# Patient Record
Sex: Female | Born: 1955 | Race: White | Hispanic: No | Smoking: Never smoker
Health system: Southern US, Community
[De-identification: ages and names within clinical notes are randomized; demographics above are authoritative.]

## PROBLEM LIST (undated history)

## (undated) DIAGNOSIS — Z803 Family history of malignant neoplasm of breast: Secondary | ICD-10-CM

## (undated) DIAGNOSIS — R931 Abnormal findings on diagnostic imaging of heart and coronary circulation: Secondary | ICD-10-CM

## (undated) DIAGNOSIS — Z1379 Encounter for other screening for genetic and chromosomal anomalies: Principal | ICD-10-CM

## (undated) DIAGNOSIS — C50919 Malignant neoplasm of unspecified site of unspecified female breast: Secondary | ICD-10-CM

## (undated) DIAGNOSIS — O139 Gestational [pregnancy-induced] hypertension without significant proteinuria, unspecified trimester: Secondary | ICD-10-CM

## (undated) DIAGNOSIS — D126 Benign neoplasm of colon, unspecified: Secondary | ICD-10-CM

## (undated) DIAGNOSIS — I10 Essential (primary) hypertension: Secondary | ICD-10-CM

## (undated) DIAGNOSIS — Z8679 Personal history of other diseases of the circulatory system: Secondary | ICD-10-CM

## (undated) DIAGNOSIS — Z8739 Personal history of other diseases of the musculoskeletal system and connective tissue: Secondary | ICD-10-CM

## (undated) DIAGNOSIS — Z8742 Personal history of other diseases of the female genital tract: Secondary | ICD-10-CM

## (undated) DIAGNOSIS — I6381 Other cerebral infarction due to occlusion or stenosis of small artery: Secondary | ICD-10-CM

## (undated) DIAGNOSIS — J45909 Unspecified asthma, uncomplicated: Secondary | ICD-10-CM

## (undated) DIAGNOSIS — N950 Postmenopausal bleeding: Secondary | ICD-10-CM

## (undated) DIAGNOSIS — Z8719 Personal history of other diseases of the digestive system: Secondary | ICD-10-CM

## (undated) DIAGNOSIS — Z8709 Personal history of other diseases of the respiratory system: Secondary | ICD-10-CM

## (undated) DIAGNOSIS — E785 Hyperlipidemia, unspecified: Secondary | ICD-10-CM

## (undated) DIAGNOSIS — R102 Pelvic and perineal pain: Secondary | ICD-10-CM

## (undated) DIAGNOSIS — R112 Nausea with vomiting, unspecified: Secondary | ICD-10-CM

## (undated) DIAGNOSIS — R300 Dysuria: Secondary | ICD-10-CM

## (undated) DIAGNOSIS — H8109 Meniere's disease, unspecified ear: Secondary | ICD-10-CM

## (undated) DIAGNOSIS — I509 Heart failure, unspecified: Secondary | ICD-10-CM

## (undated) DIAGNOSIS — K760 Fatty (change of) liver, not elsewhere classified: Secondary | ICD-10-CM

## (undated) DIAGNOSIS — I5042 Chronic combined systolic (congestive) and diastolic (congestive) heart failure: Secondary | ICD-10-CM

## (undated) DIAGNOSIS — Z86010 Personal history of colonic polyps: Secondary | ICD-10-CM

## (undated) DIAGNOSIS — Z853 Personal history of malignant neoplasm of breast: Principal | ICD-10-CM

## (undated) DIAGNOSIS — I4510 Unspecified right bundle-branch block: Secondary | ICD-10-CM

## (undated) DIAGNOSIS — T8859XA Other complications of anesthesia, initial encounter: Secondary | ICD-10-CM

## (undated) DIAGNOSIS — J309 Allergic rhinitis, unspecified: Secondary | ICD-10-CM

## (undated) DIAGNOSIS — I420 Dilated cardiomyopathy: Secondary | ICD-10-CM

## (undated) DIAGNOSIS — Z8669 Personal history of other diseases of the nervous system and sense organs: Secondary | ICD-10-CM

## (undated) DIAGNOSIS — Z9889 Other specified postprocedural states: Secondary | ICD-10-CM

## (undated) DIAGNOSIS — Z872 Personal history of diseases of the skin and subcutaneous tissue: Secondary | ICD-10-CM

## (undated) DIAGNOSIS — G4733 Obstructive sleep apnea (adult) (pediatric): Secondary | ICD-10-CM

## (undated) DIAGNOSIS — T4145XA Adverse effect of unspecified anesthetic, initial encounter: Secondary | ICD-10-CM

## (undated) DIAGNOSIS — Z8489 Family history of other specified conditions: Secondary | ICD-10-CM

## (undated) HISTORY — DX: Personal history of malignant neoplasm of breast: Z85.3

## (undated) HISTORY — DX: Allergic rhinitis, unspecified: J30.9

## (undated) HISTORY — DX: Hyperlipidemia, unspecified: E78.5

## (undated) HISTORY — DX: Personal history of other diseases of the respiratory system: Z87.09

## (undated) HISTORY — DX: Unspecified asthma, uncomplicated: J45.909

## (undated) HISTORY — DX: Personal history of other diseases of the circulatory system: Z86.79

## (undated) HISTORY — DX: Encounter for other screening for genetic and chromosomal anomalies: Z13.79

## (undated) HISTORY — DX: Personal history of diseases of the skin and subcutaneous tissue: Z87.2

## (undated) HISTORY — DX: Dysuria: R30.0

## (undated) HISTORY — DX: Personal history of other diseases of the nervous system and sense organs: Z86.69

## (undated) HISTORY — DX: Personal history of other diseases of the digestive system: Z87.19

## (undated) HISTORY — DX: Personal history of colonic polyps: Z86.010

## (undated) HISTORY — DX: Dilated cardiomyopathy: I42.0

## (undated) HISTORY — PX: LAPAROSCOPIC CHOLECYSTECTOMY: SUR755

## (undated) HISTORY — DX: Benign neoplasm of colon, unspecified: D12.6

## (undated) HISTORY — DX: Abnormal findings on diagnostic imaging of heart and coronary circulation: R93.1

## (undated) HISTORY — DX: Personal history of other diseases of the female genital tract: Z87.42

## (undated) HISTORY — DX: Personal history of other diseases of the musculoskeletal system and connective tissue: Z87.39

## (undated) HISTORY — DX: Pelvic and perineal pain: R10.2

## (undated) HISTORY — DX: Other cerebral infarction due to occlusion or stenosis of small artery: I63.81

## (undated) HISTORY — DX: Postmenopausal bleeding: N95.0

## (undated) HISTORY — DX: Family history of malignant neoplasm of breast: Z80.3

## (undated) HISTORY — DX: Essential (primary) hypertension: I10

## (undated) HISTORY — DX: Unspecified right bundle-branch block: I45.10

## (undated) HISTORY — DX: Heart failure, unspecified: I50.9

## (undated) HISTORY — DX: Chronic combined systolic (congestive) and diastolic (congestive) heart failure: I50.42

## (undated) HISTORY — DX: Meniere's disease, unspecified ear: H81.09

## (undated) HISTORY — DX: Fatty (change of) liver, not elsewhere classified: K76.0

## (undated) HISTORY — DX: Gestational (pregnancy-induced) hypertension without significant proteinuria, unspecified trimester: O13.9

## (undated) HISTORY — PX: TUBAL LIGATION: SHX77

## (undated) HISTORY — PX: NEPHRECTOMY: SHX65

## (undated) HISTORY — DX: Malignant neoplasm of unspecified site of unspecified female breast: C50.919

## (undated) HISTORY — DX: Obstructive sleep apnea (adult) (pediatric): G47.33

---

## 1994-11-08 DIAGNOSIS — C50919 Malignant neoplasm of unspecified site of unspecified female breast: Secondary | ICD-10-CM

## 1994-11-08 HISTORY — PX: OTHER SURGICAL HISTORY: SHX169

## 1994-11-08 HISTORY — DX: Malignant neoplasm of unspecified site of unspecified female breast: C50.919

## 1995-06-09 HISTORY — PX: MASTECTOMY: SHX3

## 1998-04-15 ENCOUNTER — Ambulatory Visit (HOSPITAL_COMMUNITY): Admission: RE | Admit: 1998-04-15 | Discharge: 1998-04-15 | Payer: Self-pay | Admitting: Oncology

## 1998-08-13 ENCOUNTER — Ambulatory Visit (HOSPITAL_COMMUNITY): Admission: RE | Admit: 1998-08-13 | Discharge: 1998-08-13 | Payer: Self-pay | Admitting: Urology

## 1998-08-20 ENCOUNTER — Ambulatory Visit (HOSPITAL_COMMUNITY): Admission: RE | Admit: 1998-08-20 | Discharge: 1998-08-20 | Payer: Self-pay | Admitting: Urology

## 1998-08-26 ENCOUNTER — Ambulatory Visit (HOSPITAL_COMMUNITY): Admission: RE | Admit: 1998-08-26 | Discharge: 1998-08-26 | Payer: Self-pay | Admitting: Urology

## 1998-09-08 ENCOUNTER — Inpatient Hospital Stay (HOSPITAL_COMMUNITY): Admission: RE | Admit: 1998-09-08 | Discharge: 1998-09-11 | Payer: Self-pay | Admitting: Urology

## 1998-11-10 ENCOUNTER — Ambulatory Visit (HOSPITAL_COMMUNITY): Admission: RE | Admit: 1998-11-10 | Discharge: 1998-11-10 | Payer: Self-pay | Admitting: Specialist

## 1998-11-10 ENCOUNTER — Encounter: Payer: Self-pay | Admitting: Specialist

## 1999-06-11 ENCOUNTER — Encounter: Payer: Self-pay | Admitting: General Surgery

## 1999-06-16 ENCOUNTER — Observation Stay (HOSPITAL_COMMUNITY): Admission: RE | Admit: 1999-06-16 | Discharge: 1999-06-17 | Payer: Self-pay | Admitting: General Surgery

## 1999-06-16 ENCOUNTER — Encounter (INDEPENDENT_AMBULATORY_CARE_PROVIDER_SITE_OTHER): Payer: Self-pay | Admitting: Specialist

## 2000-01-22 ENCOUNTER — Other Ambulatory Visit: Admission: RE | Admit: 2000-01-22 | Discharge: 2000-01-22 | Payer: Self-pay | Admitting: Family Medicine

## 2000-05-24 ENCOUNTER — Encounter: Payer: Self-pay | Admitting: Cardiology

## 2000-05-24 ENCOUNTER — Observation Stay (HOSPITAL_COMMUNITY): Admission: EM | Admit: 2000-05-24 | Discharge: 2000-05-25 | Payer: Self-pay | Admitting: Podiatry

## 2000-06-01 ENCOUNTER — Encounter: Payer: Self-pay | Admitting: Pulmonary Disease

## 2000-06-01 ENCOUNTER — Ambulatory Visit (HOSPITAL_COMMUNITY): Admission: RE | Admit: 2000-06-01 | Discharge: 2000-06-01 | Payer: Self-pay | Admitting: Pulmonary Disease

## 2000-06-13 ENCOUNTER — Encounter: Payer: Self-pay | Admitting: Oncology

## 2000-06-13 ENCOUNTER — Encounter: Admission: RE | Admit: 2000-06-13 | Discharge: 2000-06-13 | Payer: Self-pay | Admitting: Oncology

## 2000-06-15 ENCOUNTER — Ambulatory Visit: Admission: RE | Admit: 2000-06-15 | Discharge: 2000-06-15 | Payer: Self-pay | Admitting: Pulmonary Disease

## 2001-02-17 ENCOUNTER — Other Ambulatory Visit: Admission: RE | Admit: 2001-02-17 | Discharge: 2001-02-17 | Payer: Self-pay | Admitting: Family Medicine

## 2001-08-03 ENCOUNTER — Encounter: Payer: Self-pay | Admitting: Oncology

## 2001-08-03 ENCOUNTER — Encounter: Admission: RE | Admit: 2001-08-03 | Discharge: 2001-08-03 | Payer: Self-pay | Admitting: Oncology

## 2002-04-03 ENCOUNTER — Other Ambulatory Visit: Admission: RE | Admit: 2002-04-03 | Discharge: 2002-04-03 | Payer: Self-pay | Admitting: Family Medicine

## 2002-08-14 ENCOUNTER — Encounter: Payer: Self-pay | Admitting: Family Medicine

## 2002-08-14 ENCOUNTER — Encounter: Admission: RE | Admit: 2002-08-14 | Discharge: 2002-08-14 | Payer: Self-pay | Admitting: Family Medicine

## 2003-01-22 ENCOUNTER — Encounter: Payer: Self-pay | Admitting: Orthopedic Surgery

## 2003-01-22 ENCOUNTER — Encounter: Admission: RE | Admit: 2003-01-22 | Discharge: 2003-01-22 | Payer: Self-pay | Admitting: Orthopedic Surgery

## 2003-03-13 ENCOUNTER — Other Ambulatory Visit: Admission: RE | Admit: 2003-03-13 | Discharge: 2003-03-13 | Payer: Self-pay | Admitting: Family Medicine

## 2003-08-20 ENCOUNTER — Encounter: Payer: Self-pay | Admitting: Oncology

## 2003-08-20 ENCOUNTER — Encounter: Admission: RE | Admit: 2003-08-20 | Discharge: 2003-08-20 | Payer: Self-pay | Admitting: Oncology

## 2003-11-14 ENCOUNTER — Ambulatory Visit (HOSPITAL_COMMUNITY): Admission: RE | Admit: 2003-11-14 | Discharge: 2003-11-14 | Payer: Self-pay | Admitting: Cardiology

## 2003-11-14 ENCOUNTER — Encounter (INDEPENDENT_AMBULATORY_CARE_PROVIDER_SITE_OTHER): Payer: Self-pay | Admitting: Interventional Cardiology

## 2004-04-16 ENCOUNTER — Other Ambulatory Visit: Admission: RE | Admit: 2004-04-16 | Discharge: 2004-04-16 | Payer: Self-pay | Admitting: Family Medicine

## 2004-04-22 ENCOUNTER — Encounter: Admission: RE | Admit: 2004-04-22 | Discharge: 2004-04-22 | Payer: Self-pay | Admitting: Family Medicine

## 2005-03-01 ENCOUNTER — Ambulatory Visit (HOSPITAL_COMMUNITY): Admission: RE | Admit: 2005-03-01 | Discharge: 2005-03-01 | Payer: Self-pay | Admitting: Gastroenterology

## 2005-03-01 ENCOUNTER — Encounter (INDEPENDENT_AMBULATORY_CARE_PROVIDER_SITE_OTHER): Payer: Self-pay | Admitting: Specialist

## 2005-07-01 ENCOUNTER — Encounter: Admission: RE | Admit: 2005-07-01 | Discharge: 2005-07-01 | Payer: Self-pay | Admitting: Oncology

## 2005-08-05 ENCOUNTER — Other Ambulatory Visit: Admission: RE | Admit: 2005-08-05 | Discharge: 2005-08-05 | Payer: Self-pay | Admitting: Family Medicine

## 2005-11-08 HISTORY — PX: OTHER SURGICAL HISTORY: SHX169

## 2006-03-09 ENCOUNTER — Encounter: Payer: Self-pay | Admitting: Urology

## 2006-07-06 ENCOUNTER — Encounter: Admission: RE | Admit: 2006-07-06 | Discharge: 2006-07-06 | Payer: Self-pay | Admitting: Family Medicine

## 2006-09-27 ENCOUNTER — Other Ambulatory Visit: Admission: RE | Admit: 2006-09-27 | Discharge: 2006-09-27 | Payer: Self-pay | Admitting: Family Medicine

## 2007-07-17 ENCOUNTER — Encounter: Admission: RE | Admit: 2007-07-17 | Discharge: 2007-07-17 | Payer: Self-pay | Admitting: Family Medicine

## 2007-10-02 ENCOUNTER — Other Ambulatory Visit: Admission: RE | Admit: 2007-10-02 | Discharge: 2007-10-02 | Payer: Self-pay | Admitting: Family Medicine

## 2007-11-09 DIAGNOSIS — Z8739 Personal history of other diseases of the musculoskeletal system and connective tissue: Secondary | ICD-10-CM

## 2007-11-09 HISTORY — DX: Personal history of other diseases of the musculoskeletal system and connective tissue: Z87.39

## 2008-07-18 ENCOUNTER — Encounter: Admission: RE | Admit: 2008-07-18 | Discharge: 2008-07-18 | Payer: Self-pay | Admitting: Family Medicine

## 2009-09-11 ENCOUNTER — Encounter: Admission: RE | Admit: 2009-09-11 | Discharge: 2009-09-11 | Payer: Self-pay | Admitting: Family Medicine

## 2009-11-08 DIAGNOSIS — N950 Postmenopausal bleeding: Secondary | ICD-10-CM

## 2009-11-08 DIAGNOSIS — R300 Dysuria: Secondary | ICD-10-CM

## 2009-11-08 HISTORY — DX: Postmenopausal bleeding: N95.0

## 2009-11-08 HISTORY — DX: Dysuria: R30.0

## 2010-03-02 ENCOUNTER — Other Ambulatory Visit: Admission: RE | Admit: 2010-03-02 | Discharge: 2010-03-02 | Payer: Self-pay | Admitting: Family Medicine

## 2010-03-27 ENCOUNTER — Encounter: Admission: RE | Admit: 2010-03-27 | Discharge: 2010-03-27 | Payer: Self-pay | Admitting: Family Medicine

## 2010-11-13 ENCOUNTER — Emergency Department (HOSPITAL_BASED_OUTPATIENT_CLINIC_OR_DEPARTMENT_OTHER)
Admission: EM | Admit: 2010-11-13 | Discharge: 2010-11-13 | Payer: Self-pay | Source: Home / Self Care | Admitting: Emergency Medicine

## 2010-11-13 LAB — DIFFERENTIAL
Basophils Absolute: 0 10*3/uL (ref 0.0–0.1)
Basophils Relative: 0 % (ref 0–1)
Eosinophils Absolute: 0.2 10*3/uL (ref 0.0–0.7)
Eosinophils Relative: 3 % (ref 0–5)
Lymphocytes Relative: 47 % — ABNORMAL HIGH (ref 12–46)
Lymphs Abs: 3.3 10*3/uL (ref 0.7–4.0)
Monocytes Absolute: 0.9 10*3/uL (ref 0.1–1.0)
Monocytes Relative: 13 % — ABNORMAL HIGH (ref 3–12)
Neutro Abs: 2.6 10*3/uL (ref 1.7–7.7)
Neutrophils Relative %: 38 % — ABNORMAL LOW (ref 43–77)

## 2010-11-13 LAB — BASIC METABOLIC PANEL
BUN: 22 mg/dL (ref 6–23)
CO2: 27 mEq/L (ref 19–32)
Calcium: 9.3 mg/dL (ref 8.4–10.5)
Chloride: 102 mEq/L (ref 96–112)
Creatinine, Ser: 1 mg/dL (ref 0.4–1.2)
GFR calc Af Amer: >60 mL/min (ref >60)
GFR calc non Af Amer: 57 mL/min — ABNORMAL LOW (ref >60)
Glucose, Bld: 118 mg/dL — ABNORMAL HIGH (ref 70–99)
Potassium: 3.2 mEq/L — ABNORMAL LOW (ref 3.5–5.1)
Sodium: 142 mEq/L (ref 135–145)

## 2010-11-13 LAB — CBC
HCT: 40.4 % (ref 36.0–46.0)
Hemoglobin: 13.7 g/dL (ref 12.0–15.0)
MCH: 29 pg (ref 26.0–34.0)
MCHC: 33.9 g/dL (ref 30.0–36.0)
MCV: 85.6 fL (ref 78.0–100.0)
Platelets: 238 10*3/uL (ref 150–400)
RBC: 4.72 MIL/uL (ref 3.87–5.11)
RDW: 13.6 % (ref 11.5–15.5)
WBC: 7 10*3/uL (ref 4.0–10.5)

## 2010-11-17 ENCOUNTER — Encounter
Admission: RE | Admit: 2010-11-17 | Discharge: 2010-11-17 | Payer: Self-pay | Source: Home / Self Care | Attending: Family Medicine | Admitting: Family Medicine

## 2010-11-23 ENCOUNTER — Encounter
Admission: RE | Admit: 2010-11-23 | Discharge: 2010-11-23 | Payer: Self-pay | Source: Home / Self Care | Attending: Family Medicine | Admitting: Family Medicine

## 2010-11-28 ENCOUNTER — Encounter: Payer: Self-pay | Admitting: Family Medicine

## 2011-03-26 NOTE — Op Note (Signed)
NAME:  Meagan Ryan, Meagan Ryan NO.:  [id number]   MEDICAL RECORD NO.:  [id number]          PATIENT TYPE:  AMB   LOCATION:  ENDO                         FACILITY:  [facility]   PHYSICIAN:  Graylin Shiver, M.D.   DATE OF BIRTH:  1956-05-08   DATE OF PROCEDURE:  03/01/2005  DATE OF DISCHARGE:                                 OPERATIVE REPORT   PROCEDURE:  Colonoscopy with biopsy.   INDICATIONS FOR PROCEDURE:  Right lower quadrant abdominal pain, which the  patient was experiencing.  As of today, the patient states that she is no  longer having that pain.  Also, screening colonoscopy due to age of 55 years  old.   Informed consent was obtained after explanation of the risks of bleeding,  infection, and perforation.   PREMEDICATIONS:  Fentanyl 87.5 mcg IV, Versed 9 mg IV.   PROCEDURE:  With the patient in the left lateral decubitus position, a  rectal exam was performed.  No masses were felt.  The Olympus colonoscope  was inserted into the rectum and advanced around the colon to the cecum.  Cecal landmarks were identified.  The cecum looked normal.  The ascending  colon looked normal.  In the proximal transverse colon, there was a 3 mm  polyp biopsied off with cold forceps.  .  The descending colon, sigmoid, and  rectum looked normal.  She tolerated the procedure well without  complications.   IMPRESSION:  Small transverse colon polyp.   PLAN:  The pathology will be checked.      SFG/MEDQ  D:  03/01/2005  T:  03/01/2005  Job:  [id number]   cc:   Caryn Bee L. Little, M.D.  [address]  [place]  [place] [address]  Fax: [phone]

## 2011-03-26 NOTE — H&P (Signed)
[place]  Patient:    MATTINGLY, FOUNTAINE                  MRN: [id number] Adm. Date:  [id number] Attending:  Corliss Marcus Dictator:   Anselm Lis, N.P. CC:         Anna Genre. Little, M.D.             Leighton Roach. Truett Perna, M.D.             [facility] - Attention:  Dr. Oley Balm. Simonds or Dr                         History and Physical  DATE OF BIRTH:  01-08-1956  HISTORY OF PRESENT ILLNESS:  Ms. 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 Her cardiomyopathy has improved on medical therapy, so that her ejection fraction is 55% by recent 2-D echocardiogram.  The patient has a three-month history of progressive shortness of breath and weakness, exacerbated over the last few weeks, so that she is short of breath after minimal activity such as taking a shower.  Fatigue seems to follow her shortness of breath.  She has also noted new symptoms of abdominal swelling and GERD over the last month, with epigastric/substernal burning and associated sour taste after eating/drinking anything.  She takes her husbands Zantac with occasional improvement.  This discomfort is exacerbated with reclining back.  She has noted a weight gain of 15-20 pounds without appreciable increase in appetite or food consumption over the last few months as well.  She  complains of orthopnea when initially reclining back, though she denies PND, and she can wake up in a flat lying position without shortness of breath.  No pedal  edema.  Recent clinical examination by Dr. Francisca December included a chest x-ray which was negative for CHF, and a 2-D echocardiogram revealing normal LV size and function with an ejection fraction of 55%.  Her systolic blood pressure has been under good control, though elevated diastolic of 90-100.  She is maintaining sinus tachycardia in the low  100s.  Her room air O2 saturations are 98%.  Lasix was added on the day prior to admission without appreciable improvement in the shortness f breath symptoms.  PAST MEDICAL HISTORY: 1. Adriamycin-induced cardiomyopathy, improved with medical therapy, an    ejection fraction of 55% by a 2-D echocardiogram on May 20, 2000. 2. Hypertension, under good control on the current medical regimen. 3. Left breast cancer with subsequent left mastectomy with reconstructive    surgery.  Had "double-dose" chemotherapy without apparent recurrence of    cancer.  She had a normal bone scan last year.  Her breast cancer was    approximately five years earlier. 4. Cholecystectomy three years earlier. 5. 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  The kidney    was finally removed because of complaints of abdominal pain.  The patient denies a history of diabetes mellitus, asthma, or thyroid disease.  PAST SURGICAL HISTORY: 1. Cholecystectomy. 2. Right nephrectomy. 3. Left mastectomy with reconstructive breast surgery. 4. A Port-A-Cath left anterior chest inserted and removed.  ALLERGIES:  Nausea with SEA FOOD.  PENICILLIN causes a rash.  CEPHALEXIN, SULFA, AND "NOROXIN" causing severe nausea.  CODEINE causing nausea.  CURRENT MEDICATIONS: 1. Hydrochlorothiazide 25 mg p.o. q.a.m. 2. Dynacirc 5 mg p.o. q.h.s. 3.  Accupril 20 mg p.o. q.h.s. 4. Xanax 0.25 mg p.o. b.i.d. 5. Evista 60 mg p.o. q.d. 6. Lasix 40 mg q.d. started yesterday.  SOCIAL HISTORY:  The patient has been married for 27 years.  She has a daughter age 76, alive and well.  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 ETOH:  Negative.  Smoking: Negative.  FAMILY HISTORY:  Her mother is age 53, has diabetes mellitus and hypertension. Had a heart attack in her 69s.  She has two sisters ages 23 and 85, both with diabetes mellitus.  REVIEW OF SYSTEMS:  As in the HPI and past medical history.   Otherwise specifically mentioned episodic lightheadedness after bending over the last two or three days. Denies dysphagia to food or fluid.  Negative constipation or diarrhea. Negative melena or bright red blood per rectum.  Denies pedal edema.  No dysuria or hematuria.  Does have some right groin discomfort which has been evaluated in the past with a negative workup.  PHYSICAL EXAMINATION:  VITAL SIGNS:  Blood pressure 120/86, heart rate 122 and regular, respirations 24, temperature 97.2 degrees.  O2 saturation 98% on room air.  GENERAL:  She is an overweight, pleasantly-conversant female, not appearing anxious or in distress.  HEENT/NECK:  Brisk bilateral carotid upstroke without bruit.  No significant jugular venous distention or thyromegaly.  CHEST:  Lung sounds clear with scant bibasilar crackles.  Negative CVA tenderness.  CARDIAC:  A regular rate and rhythm.  Positive S4.  Negative rub or murmur.  ABDOMEN:  Soft, nondistended.  Normoactive bowel sounds.  Negative abdominal aortic, renal, or femoral bruits.  No masses, no organomegaly appreciated.  EXTREMITIES:  With +2/4 bilateral radial, femoral, dorsalis pedis pulses. Negative pedal edema.  GENITOURINARY:  Deferred.  RECTAL:  Deferred.  NEUROLOGIC:  Cranial nerves II-XII grossly intact.  Alert and oriented x 3.  LABORATORY DATA:  Electrocardiogram revealed a sinus tachycardia at 113 beats per minute with J-point depression with upsloping ST-waves inferiorly.  Chest x-ray revealed a stable chest without evidence of acute cardiac or pulmonary process.  A 2-D echocardiogram on May 20, 2000, revealed normal LV size, without evidence of thrombus.  The ejection fraction was 55%.  Chemistry profile revealed a sodium of 133, K of 3.1, chloride 92, CO2 of 32, BUN 20, creatinine 1.1, glucose 98.  CBC reveals a hemoglobin of 14.8, hematocrit 43.5, WBC 9.4, platelets 351.  Liver function tests within normal  limits.  Room air ABG: Revealed pH of 7.501, PCO2 of 40.4, PO2 of 85, bicarbonate 32, PCO2 of 33, O2  saturation 97%.  Pro time 12.9, INR 1.0, PTT 27.  D-dimer was less than 0.22.  A chest CT (rule out pulmonary embolic protocol):  Revealed no filling defects within either pulmonary artery to suggest pulmonary emboli.  The thoracic aorta is of normal caliber, without evidence of dissection.  No axillary, mediastinal, or hilar adenopathy.  Lung windows reveal no evidence of nodule, infiltrate, or effusion bilaterally.  The airway is widely patent.  The visualized upper abdomen is unremarkable.  A limited CT of the lower extremity without contrast revealed no evidence of deep vein thrombosis.  Incidental note made of a right ovarian cyst  which measures 2.8 cm in the greatest diameter, with a normal range for a premenopausal patient.  CPK is 54 with a MB fraction of 0.7.  TSH within normal limits at 3.055.  IMPRESSION: 1. Constellation of symptoms including resting sinus tachycardia, fatigue,    exertional  shortness of breath, and weight gain in this 55 year old female,    with a history of breast cancer and subsequent Adriamycin-induced    cardiomyopathy, though her left ventricular function is now normalized    with a recent echocardiogram revealing an ejection fraction of 55%.  Her    chest x-ray is clear.  Her saturations are okay on room air, though she    does have a borderline low PAO2.  Her chest CT and D-dimer are negative    for evidence of pulmonary emboli and lower extremity CT negative for a    deep vein thrombosis. 2. Gastroesophageal reflux disease. 3. Hypokalemia, secondary to diuretics.  Serum K of 3.1, will supplement.  PLAN: 1. Will obtain a consultation from Dr. Jillyn Hidden B. Sherrill, hematology/oncology. 2. Pulmonary function tests with DLCO and pre and post-dilator therapy.    Anticipate a pulmonary consultation as an outpatient if the patient is    stable. 3.  Will anticipate discharge to home on medications as prior to admission,    with the exclusion of Lasix, with followup with Dr. Amil Amen, Dr. Truett Perna,    and a pulmonologist. DD:  05/25/00 TD:  05/25/00 Job: [id number]

## 2011-04-01 ENCOUNTER — Other Ambulatory Visit: Payer: Self-pay | Admitting: Family Medicine

## 2011-04-01 DIAGNOSIS — Z1231 Encounter for screening mammogram for malignant neoplasm of breast: Secondary | ICD-10-CM

## 2011-04-23 ENCOUNTER — Ambulatory Visit: Payer: Self-pay

## 2011-04-30 ENCOUNTER — Ambulatory Visit
Admission: RE | Admit: 2011-04-30 | Discharge: 2011-04-30 | Disposition: A | Discharge status: Home / Self Care | Payer: 59 | Source: Ambulatory Visit | Attending: Family Medicine | Admitting: Family Medicine

## 2011-04-30 DIAGNOSIS — Z1231 Encounter for screening mammogram for malignant neoplasm of breast: Secondary | ICD-10-CM

## 2012-01-12 ENCOUNTER — Encounter (INDEPENDENT_AMBULATORY_CARE_PROVIDER_SITE_OTHER): Payer: 59 | Admitting: Obstetrics and Gynecology

## 2012-01-12 DIAGNOSIS — N949 Unspecified condition associated with female genital organs and menstrual cycle: Secondary | ICD-10-CM

## 2012-01-13 ENCOUNTER — Encounter (INDEPENDENT_AMBULATORY_CARE_PROVIDER_SITE_OTHER): Payer: 59 | Admitting: Obstetrics and Gynecology

## 2012-01-13 DIAGNOSIS — N949 Unspecified condition associated with female genital organs and menstrual cycle: Secondary | ICD-10-CM

## 2012-01-13 DIAGNOSIS — N83209 Unspecified ovarian cyst, unspecified side: Secondary | ICD-10-CM

## 2012-01-28 ENCOUNTER — Encounter: Payer: 59 | Admitting: Registered Nurse

## 2012-01-31 ENCOUNTER — Ambulatory Visit: Payer: Self-pay | Admitting: Obstetrics and Gynecology

## 2012-02-08 ENCOUNTER — Other Ambulatory Visit: Payer: Self-pay

## 2012-02-08 DIAGNOSIS — R102 Pelvic and perineal pain: Secondary | ICD-10-CM

## 2012-02-22 ENCOUNTER — Encounter: Payer: 59 | Admitting: Obstetrics and Gynecology

## 2012-02-22 ENCOUNTER — Other Ambulatory Visit: Payer: 59

## 2012-02-24 ENCOUNTER — Other Ambulatory Visit: Payer: 59

## 2012-03-06 ENCOUNTER — Ambulatory Visit: Payer: 59 | Admitting: Obstetrics and Gynecology

## 2012-03-27 ENCOUNTER — Ambulatory Visit: Payer: 59 | Admitting: Obstetrics and Gynecology

## 2012-04-20 ENCOUNTER — Other Ambulatory Visit: Payer: Self-pay | Admitting: Otolaryngology

## 2012-04-20 DIAGNOSIS — R42 Dizziness and giddiness: Secondary | ICD-10-CM

## 2012-04-27 ENCOUNTER — Ambulatory Visit
Admission: RE | Admit: 2012-04-27 | Discharge: 2012-04-27 | Disposition: A | Discharge status: Home / Self Care | Payer: 59 | Source: Ambulatory Visit | Attending: Otolaryngology | Admitting: Otolaryngology

## 2012-04-27 DIAGNOSIS — R42 Dizziness and giddiness: Secondary | ICD-10-CM

## 2012-04-27 MED ORDER — GADOBENATE DIMEGLUMINE 529 MG/ML IV SOLN
20.0000 mL | Freq: Once | INTRAVENOUS | Status: AC | PRN
  Administered 2012-04-27: 20 mL via INTRAVENOUS

## 2012-05-23 ENCOUNTER — Ambulatory Visit: Payer: 59 | Admitting: Obstetrics and Gynecology

## 2012-05-29 ENCOUNTER — Other Ambulatory Visit: Payer: Self-pay | Admitting: Family Medicine

## 2012-05-29 DIAGNOSIS — Z1231 Encounter for screening mammogram for malignant neoplasm of breast: Secondary | ICD-10-CM

## 2012-06-02 ENCOUNTER — Ambulatory Visit (INDEPENDENT_AMBULATORY_CARE_PROVIDER_SITE_OTHER): Payer: 59 | Admitting: Obstetrics and Gynecology

## 2012-06-02 ENCOUNTER — Encounter: Payer: Self-pay | Admitting: Obstetrics and Gynecology

## 2012-06-02 VITALS — BP 122/82 | Ht 64.0 in | Wt 218.0 lb

## 2012-06-02 DIAGNOSIS — H8109 Meniere's disease, unspecified ear: Secondary | ICD-10-CM

## 2012-06-02 DIAGNOSIS — Z124 Encounter for screening for malignant neoplasm of cervix: Secondary | ICD-10-CM

## 2012-06-02 NOTE — Progress Notes (Signed)
Last Pap: 4/11 WNL: Yes Regular Periods:no Contraception: btl  Monthly Breast exam:yes Tetanus<71yrs:yes Nl.Bladder Function:yes Daily BMs:yes Healthy Diet:yes Calcium:yes Mammogram:yes Date of Mammogram: 2012 wnl per pt Exercise:no Have often Exercise: n/a Seatbelt: yes Abuse at home: no Stressful work:no Sigmoid-colonoscopy: 2 yrs ago wnl per pt Bone Density: yes wnl per pt PCP: Dr. 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  She still occ has vertigo and can not work Change in ZOX:WRUEAV colon cancer BP 122/82  Ht 5\' 4"  (1.626 m)  Wt 218 lb (98.884 kg)  BMI 37.42 kg/m2 Physical Examination: Neck - supple, no significant adenopathy Chest - clear to auscultation, no wheezes, rales or rhonchi, symmetric air entry Heart - normal rate, regular rhythm, normal S1, S2, no murmurs, rubs, clicks or gallops Abdomen - soft, nontender, nondistended, no masses or organomegaly Breasts - breasts appear normal, no suspicious masses, no skin or nipple changes or axillary nodes Pelvic - normal external genitalia, vulva, vagina, cervix, uterus and adnexa, atrophic Rectal - normal rectal, no masses Musculoskeletal - no joint tenderness, deformity or swelling Extremities - peripheral pulses normal, no pedal edema, no clubbing or cyanosis Skin - normal coloration and turgor, no rashes, no suspicious skin lesions noted Normal AEX Pt due for mammogram yes scheduled 06/05/12 colonoscopy due no Pap done yes RT one year Diet and exercise discussed

## 2012-06-05 ENCOUNTER — Ambulatory Visit
Admission: RE | Admit: 2012-06-05 | Discharge: 2012-06-05 | Disposition: A | Discharge status: Home / Self Care | Payer: 59 | Source: Ambulatory Visit | Attending: Family Medicine | Admitting: Family Medicine

## 2012-06-05 DIAGNOSIS — Z1231 Encounter for screening mammogram for malignant neoplasm of breast: Secondary | ICD-10-CM

## 2012-06-05 LAB — PAP IG W/ RFLX HPV ASCU

## 2013-05-10 ENCOUNTER — Other Ambulatory Visit: Payer: Self-pay

## 2013-05-10 DIAGNOSIS — Z1231 Encounter for screening mammogram for malignant neoplasm of breast: Secondary | ICD-10-CM

## 2013-06-11 ENCOUNTER — Ambulatory Visit
Admission: RE | Admit: 2013-06-11 | Discharge: 2013-06-11 | Disposition: A | Discharge status: Home / Self Care | Source: Ambulatory Visit

## 2013-06-11 DIAGNOSIS — Z1231 Encounter for screening mammogram for malignant neoplasm of breast: Secondary | ICD-10-CM

## 2014-04-24 ENCOUNTER — Encounter: Payer: Self-pay | Admitting: General Surgery

## 2014-04-24 DIAGNOSIS — I420 Dilated cardiomyopathy: Secondary | ICD-10-CM | POA: Insufficient documentation

## 2014-04-24 DIAGNOSIS — I428 Other cardiomyopathies: Secondary | ICD-10-CM

## 2014-04-24 DIAGNOSIS — I509 Heart failure, unspecified: Secondary | ICD-10-CM

## 2014-04-24 DIAGNOSIS — I110 Hypertensive heart disease with heart failure: Secondary | ICD-10-CM

## 2014-05-06 ENCOUNTER — Ambulatory Visit: Admitting: Cardiology

## 2014-05-29 ENCOUNTER — Encounter: Payer: Self-pay | Admitting: *Deleted

## 2014-06-06 ENCOUNTER — Ambulatory Visit: Admitting: Cardiology

## 2014-07-29 ENCOUNTER — Other Ambulatory Visit: Payer: Self-pay

## 2014-07-29 DIAGNOSIS — Z1231 Encounter for screening mammogram for malignant neoplasm of breast: Secondary | ICD-10-CM

## 2014-07-29 DIAGNOSIS — Z9012 Acquired absence of left breast and nipple: Secondary | ICD-10-CM

## 2014-08-23 ENCOUNTER — Ambulatory Visit

## 2014-09-09 ENCOUNTER — Encounter: Payer: Self-pay | Admitting: *Deleted

## 2014-09-18 ENCOUNTER — Encounter: Payer: Self-pay | Admitting: Cardiology

## 2014-09-18 ENCOUNTER — Ambulatory Visit (INDEPENDENT_AMBULATORY_CARE_PROVIDER_SITE_OTHER): Admitting: Cardiology

## 2014-09-18 VITALS — BP 124/90 | HR 92 | Ht 64.0 in | Wt 229.8 lb

## 2014-09-18 DIAGNOSIS — I10 Essential (primary) hypertension: Secondary | ICD-10-CM

## 2014-09-18 DIAGNOSIS — I420 Dilated cardiomyopathy: Secondary | ICD-10-CM

## 2014-09-18 DIAGNOSIS — I4510 Unspecified right bundle-branch block: Secondary | ICD-10-CM | POA: Insufficient documentation

## 2014-09-18 LAB — BASIC METABOLIC PANEL
BUN: 12 mg/dL (ref 6–23)
CO2: 28 mEq/L (ref 19–32)
Calcium: 9.5 mg/dL (ref 8.4–10.5)
Chloride: 100 mEq/L (ref 96–112)
Creatinine, Ser: 1 mg/dL (ref 0.4–1.2)
GFR: 59.43 mL/min — AB (ref >60.00)
Glucose, Bld: 90 mg/dL (ref 70–99)
POTASSIUM: 3.7 meq/L (ref 3.5–5.1)
SODIUM: 136 meq/L (ref 135–145)

## 2014-09-18 NOTE — Patient Instructions (Addendum)
Your physician recommends that you have lab work TODAY (BMET).  Your physician has recommended you make the following change in your medication:  1) Start taking Benicar in the morning.  Your physician wants you to follow-up in: 1 year with Dr. Radford Pax. You will receive a reminder letter in the mail two months in advance. If you don't receive a letter, please call our office to schedule the follow-up appointment.

## 2014-09-18 NOTE — Progress Notes (Signed)
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  She is doing well.  She denies any chest pain or palpitations.  She has noticed some DOE after gaining 12 pounds.  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  She has chronic LE edema from her peripheral neuropathy that is stable on diuretics.      Wt Readings from Last 3 Encounters:  09/18/14 229 lb 12.8 oz (104.237 kg)  06/02/12 218 lb (98.884 kg)     Past Medical History  Diagnosis Date  . Pelvic pain in female   . Hypertension   . History of IBS   . History of hyperlipidemia   . H/O allergic rhinitis   . H/O osteopenia 2009  . HX: breast cancer   . Tubular adenoma of colon   . H/O fibromyalgia   . H/O actinic keratosis   . Dysuria 2011  . PMB (postmenopausal bleeding) 2011  . H/O peripheral neuropathy   . Diabetes mellitus   . Hx of colonic polyps   . Hx of migraines   . Asthma   . H/O varicose veins   . Hx of ovarian cyst   . Pregnancy induced hypertension   . PMB (postmenopausal bleeding) 2011  . Hyperlipidemia   . Breast cancer 1996    stage II infiltrating ductal carcinoma, 3/8 axillary lymph nodes were positive for metastatic carcinoma, getting yearly mammogram and every other year MRI followed by oncologist Dr Benay Spice  . Allergic rhinitis   . Right-sided lacunar infarction     MRI 1.2012  . Fatty liver CT 1/12    elevated LFT  . Incomplete RBBB   . DCM (dilated cardiomyopathy)     Current Outpatient Prescriptions  Medication Sig Dispense Refill  . ALPRAZolam (XANAX) 0.5 MG tablet Take 0.5 mg by mouth at bedtime as needed.    Marland Kitchen aspirin 81 MG tablet Take 81 mg by  mouth daily.    . cetirizine (ZYRTEC) 10 MG tablet Take 10 mg by mouth daily.    . Cholecalciferol (VITAMIN D-3 PO) Take by mouth 2 (two) times daily.     . DULoxetine (CYMBALTA) 30 MG capsule Take 30 mg by mouth daily.    . fish oil-omega-3 fatty acids 1000 MG capsule Take 2 g by mouth 2 (two) times daily.     . fluticasone (FLOVENT DISKUS) 50 MCG/BLIST diskus inhaler Inhale 2 puffs into the lungs daily.    . hydrochlorothiazide (HYDRODIURIL) 25 MG tablet Take 25 mg by mouth daily.    . meclizine (ANTIVERT) 12.5 MG tablet Take 12.5 mg by mouth 3 (three) times daily as needed for dizziness.    . niacin (NIASPAN) 500 MG CR tablet Take 500 mg by mouth at bedtime.    Marland Kitchen olmesartan (BENICAR) 40 MG tablet Take 20 mg by mouth daily.     Marland Kitchen omeprazole (PRILOSEC) 10 MG capsule Take 10 mg by mouth daily.    . potassium chloride (K-DUR) 10 MEQ tablet Take 10 mEq by mouth daily.    . raloxifene (EVISTA) 60 MG tablet Take 60 mg by mouth daily.  No current facility-administered medications for this visit.    Allergies:    Allergies  Allergen Reactions  . Norvasc [Amlodipine Besylate] Shortness Of Breath    Breathing difficulties  . Cephalexin Other (See Comments)    High temperature  . Codeine Nausea And Vomiting  . Noroxin [Norfloxacin]   . Sulfa Antibiotics Nausea And Vomiting  . Penicillins Rash    Social History:  The patient  reports that she has never smoked. She does not have any smokeless tobacco history on file. She reports that she does not drink alcohol or use illicit drugs.   Family History:  The patient's family history includes Cancer in her father and sister; Diabetes in her mother; Heart disease in her mother; Hypertension in her mother; Stroke in her mother.   ROS:  Please see the history of present illness.      All other systems reviewed and negative.   PHYSICAL EXAM: VS:  BP 124/90 mmHg  Pulse 92  Ht 5\' 4"  (1.626 m)  Wt 229 lb 12.8 oz (104.237 kg)  BMI 39.43  kg/m2 Well nourished, well developed, in no acute distress HEENT: normal Neck: no JVD LUNG:  CTA bilaterally COR:  RRR with no M/R/G EXT:  No C/E/E 2+ pulses bilaterally  EKG:  NSR with RBBB  ASSESSMENT/PLAN: 1.  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  HTN with elevated DBP - continue HCTZ - change Benicar to taking in the am - check BP at pharmacy and call me with results - check BMET 3.  RBBB - with ischemia on nuclear stress test in 2013 in setting of incomplete RBBB  Folllowup with me in 1 year   Signed, Fransico Him, MD [facility] 09/18/2014 10:00 AM

## 2014-10-15 ENCOUNTER — Other Ambulatory Visit: Payer: Self-pay | Admitting: Family Medicine

## 2014-10-15 DIAGNOSIS — I639 Cerebral infarction, unspecified: Secondary | ICD-10-CM

## 2014-10-30 ENCOUNTER — Ambulatory Visit
Admission: RE | Admit: 2014-10-30 | Discharge: 2014-10-30 | Disposition: A | Discharge status: Home / Self Care | Source: Ambulatory Visit

## 2014-10-30 DIAGNOSIS — Z1231 Encounter for screening mammogram for malignant neoplasm of breast: Secondary | ICD-10-CM

## 2014-10-30 DIAGNOSIS — Z9012 Acquired absence of left breast and nipple: Secondary | ICD-10-CM

## 2014-11-04 ENCOUNTER — Ambulatory Visit
Admission: RE | Admit: 2014-11-04 | Discharge: 2014-11-04 | Disposition: A | Discharge status: Home / Self Care | Source: Ambulatory Visit | Attending: Family Medicine | Admitting: Family Medicine

## 2014-11-04 DIAGNOSIS — I639 Cerebral infarction, unspecified: Secondary | ICD-10-CM

## 2014-11-27 ENCOUNTER — Emergency Department (HOSPITAL_COMMUNITY)

## 2014-11-27 ENCOUNTER — Emergency Department (HOSPITAL_COMMUNITY)
Admission: EM | Admit: 2014-11-27 | Discharge: 2014-11-27 | Disposition: A | Discharge status: Home / Self Care | Attending: Emergency Medicine | Admitting: Emergency Medicine

## 2014-11-27 ENCOUNTER — Encounter (HOSPITAL_COMMUNITY): Payer: Self-pay

## 2014-11-27 DIAGNOSIS — Z8739 Personal history of other diseases of the musculoskeletal system and connective tissue: Secondary | ICD-10-CM | POA: Diagnosis not present

## 2014-11-27 DIAGNOSIS — Z8742 Personal history of other diseases of the female genital tract: Secondary | ICD-10-CM | POA: Diagnosis not present

## 2014-11-27 DIAGNOSIS — Z7982 Long term (current) use of aspirin: Secondary | ICD-10-CM | POA: Insufficient documentation

## 2014-11-27 DIAGNOSIS — Z8669 Personal history of other diseases of the nervous system and sense organs: Secondary | ICD-10-CM | POA: Insufficient documentation

## 2014-11-27 DIAGNOSIS — Z7951 Long term (current) use of inhaled steroids: Secondary | ICD-10-CM | POA: Insufficient documentation

## 2014-11-27 DIAGNOSIS — Z872 Personal history of diseases of the skin and subcutaneous tissue: Secondary | ICD-10-CM | POA: Insufficient documentation

## 2014-11-27 DIAGNOSIS — Z853 Personal history of malignant neoplasm of breast: Secondary | ICD-10-CM | POA: Diagnosis not present

## 2014-11-27 DIAGNOSIS — Y9301 Activity, walking, marching and hiking: Secondary | ICD-10-CM | POA: Insufficient documentation

## 2014-11-27 DIAGNOSIS — Y998 Other external cause status: Secondary | ICD-10-CM | POA: Insufficient documentation

## 2014-11-27 DIAGNOSIS — Z880 Allergy status to penicillin: Secondary | ICD-10-CM | POA: Insufficient documentation

## 2014-11-27 DIAGNOSIS — Z86010 Personal history of colonic polyps: Secondary | ICD-10-CM | POA: Insufficient documentation

## 2014-11-27 DIAGNOSIS — I10 Essential (primary) hypertension: Secondary | ICD-10-CM | POA: Insufficient documentation

## 2014-11-27 DIAGNOSIS — E785 Hyperlipidemia, unspecified: Secondary | ICD-10-CM | POA: Diagnosis not present

## 2014-11-27 DIAGNOSIS — W010XXA Fall on same level from slipping, tripping and stumbling without subsequent striking against object, initial encounter: Secondary | ICD-10-CM | POA: Diagnosis not present

## 2014-11-27 DIAGNOSIS — Z8719 Personal history of other diseases of the digestive system: Secondary | ICD-10-CM | POA: Insufficient documentation

## 2014-11-27 DIAGNOSIS — Z86018 Personal history of other benign neoplasm: Secondary | ICD-10-CM | POA: Diagnosis not present

## 2014-11-27 DIAGNOSIS — E119 Type 2 diabetes mellitus without complications: Secondary | ICD-10-CM | POA: Insufficient documentation

## 2014-11-27 DIAGNOSIS — S0990XA Unspecified injury of head, initial encounter: Secondary | ICD-10-CM

## 2014-11-27 DIAGNOSIS — G43909 Migraine, unspecified, not intractable, without status migrainosus: Secondary | ICD-10-CM | POA: Insufficient documentation

## 2014-11-27 DIAGNOSIS — W19XXXA Unspecified fall, initial encounter: Secondary | ICD-10-CM

## 2014-11-27 DIAGNOSIS — Y92093 Driveway of other non-institutional residence as the place of occurrence of the external cause: Secondary | ICD-10-CM | POA: Diagnosis not present

## 2014-11-27 DIAGNOSIS — Z79899 Other long term (current) drug therapy: Secondary | ICD-10-CM | POA: Insufficient documentation

## 2014-11-27 DIAGNOSIS — S43004A Unspecified dislocation of right shoulder joint, initial encounter: Secondary | ICD-10-CM | POA: Diagnosis not present

## 2014-11-27 DIAGNOSIS — S42251A Displaced fracture of greater tuberosity of right humerus, initial encounter for closed fracture: Secondary | ICD-10-CM | POA: Insufficient documentation

## 2014-11-27 DIAGNOSIS — S0101XA Laceration without foreign body of scalp, initial encounter: Secondary | ICD-10-CM | POA: Diagnosis not present

## 2014-11-27 DIAGNOSIS — J45909 Unspecified asthma, uncomplicated: Secondary | ICD-10-CM | POA: Insufficient documentation

## 2014-11-27 DIAGNOSIS — S4991XA Unspecified injury of right shoulder and upper arm, initial encounter: Secondary | ICD-10-CM | POA: Diagnosis present

## 2014-11-27 MED ORDER — HYDROMORPHONE HCL 1 MG/ML IJ SOLN
1.0000 mg | Freq: Once | INTRAMUSCULAR | Status: DC
Start: 2014-11-27 — End: 2014-11-27

## 2014-11-27 MED ORDER — OXYCODONE-ACETAMINOPHEN 5-325 MG PO TABS: 1.0000 | ORAL_TABLET | ORAL | Status: DC | PRN

## 2014-11-27 MED ORDER — ONDANSETRON 8 MG PO TBDP
8.0000 mg | ORAL_TABLET | Freq: Three times a day (TID) | ORAL | Status: DC | PRN
Start: 2014-11-27 — End: 2015-08-21

## 2014-11-27 MED ORDER — HYDROMORPHONE HCL 1 MG/ML IJ SOLN
1.0000 mg | Freq: Once | INTRAMUSCULAR | Status: AC
  Administered 2014-11-27: 1 mg via INTRAVENOUS
  Filled 2014-11-27: qty 1

## 2014-11-27 MED ORDER — ETOMIDATE 2 MG/ML IV SOLN
0.2000 mg/kg | Freq: Once | INTRAVENOUS | Status: AC
  Administered 2014-11-27: 19.96 mg via INTRAVENOUS
  Filled 2014-11-27: qty 10

## 2014-11-27 MED ORDER — ONDANSETRON HCL 4 MG/2ML IJ SOLN
4.0000 mg | Freq: Once | INTRAMUSCULAR | Status: AC
  Administered 2014-11-27: 4 mg via INTRAVENOUS
  Filled 2014-11-27: qty 2

## 2014-11-27 NOTE — Sedation Documentation (Signed)
Jaw thrust done by dr. Venora Maples.

## 2014-11-27 NOTE — ED Notes (Signed)
Consent signed by daughter. Pt is right handed and unable to sign

## 2014-11-27 NOTE — Discharge Instructions (Signed)
Shoulder Dislocation  Your shoulder is made up of three bones: the collar bone (clavicle); the shoulder blade (scapula), which includes the socket (glenoid cavity); and the upper arm bone (humerus). Your shoulder joint is the place where these bones meet. Strong, fibrous tissues hold these bones together (ligaments). Muscles and strong, fibrous tissues that connect the muscles to these bones (tendons) allow your arm to move through this joint. The range of motion of your shoulder joint is more extensive than most of your other joints, and the glenoid cavity is very shallow. That is the reason that your shoulder joint is one of the most unstable joints in your body. It is far more prone to dislocation than your other joints. Shoulder dislocation is when your humerus is forced out of your shoulder joint.  CAUSES  Shoulder dislocation is caused by a forceful impact on your shoulder. This impact usually is from an injury, such as a sports injury or a fall.  SYMPTOMS  Symptoms of shoulder dislocation include:  · Deformity of your shoulder.  · Intense pain.  · Inability to move your shoulder joint.  · Numbness, weakness, or tingling around your shoulder joint (your neck or down your arm).  · Bruising or swelling around your shoulder.  DIAGNOSIS  In order to diagnose a dislocated shoulder, your caregiver will perform a physical exam. Your caregiver also may have an X-ray exam done to see if you have any broken bones. Magnetic resonance imaging (MRI) is a procedure that sometimes is done to help your caregiver see any damage to the soft tissues around your shoulder, particularly your rotator cuff tendons. Additionally, your caregiver also may have electromyography done to measure the electrical discharges produced in your muscles if you have signs or symptoms of nerve damage.  TREATMENT  A shoulder dislocation is treated by placing the humerus back in the joint (reduction). Your caregiver does this either manually (closed  reduction), by moving your humerus back into the joint through manipulation, or through surgery (open reduction). When your humerus is back in place, severe pain should improve almost immediately.  You also may need to have surgery if you have a weak shoulder joint or ligaments, and you have recurring shoulder dislocations, despite rehabilitation. In rare cases, surgery is necessary if your nerves or blood vessels are damaged during the dislocation.  After your reduction, your arm will be placed in a shoulder immobilizer or sling to keep it from moving. Your caregiver will have you wear your shoulder immobilizer or sling for 3 days to 3 weeks, depending on how serious your dislocation is. When your shoulder immobilizer or sling is removed, your caregiver may prescribe physical therapy to help improve the range of motion in your shoulder joint.  HOME CARE INSTRUCTIONS   The following measures can help to reduce pain and speed up the healing process:  · Rest your injured joint. Do not move it. Avoid activities similar to the one that caused your injury.  · Apply ice to your injured joint for the first day or two after your reduction or as directed by your caregiver. Applying ice helps to reduce inflammation and pain.  ¨ Put ice in a plastic bag.  ¨ Place a towel between your skin and the bag.  ¨ Leave the ice on for 15-20 minutes at a time, every 2 hours while you are awake.  · Exercise your hand by squeezing a soft ball. This helps to eliminate stiffness and swelling in your hand and   wrist.  · Take over-the-counter or prescription medicine for pain or discomfort as told by your caregiver.  SEEK IMMEDIATE MEDICAL CARE IF:   · Your shoulder immobilizer or sling becomes damaged.  · Your pain becomes worse rather than better.  · You lose feeling in your arm or hand, or they become white and cold.  MAKE SURE YOU:   · Understand these instructions.  · Will watch your condition.  · Will get help right away if you are not  doing well or get worse.  Document Released: 07/20/2001 Document Revised: 03/11/2014 Document Reviewed: 08/15/2011  Exit[employer]® Patient Information ©2015 [employer]. This information is not intended to replace advice given to you by your health care provider. Make sure you discuss any questions you have with your health care provider.

## 2014-11-27 NOTE — Sedation Documentation (Signed)
Switched from NRB to cannula at 5 liters. Will wean off the oxygen.

## 2014-11-27 NOTE — ED Notes (Signed)
Tori, PA at the bedside.  

## 2014-11-27 NOTE — ED Notes (Signed)
Pt back to the room, placed back on the monitor. Oxygen ready at bedside. Tori, PA at the bedside cleaning head lac.

## 2014-11-27 NOTE — Sedation Documentation (Signed)
Lips a little cyanotic. Pt still on NRB. Sats dropping. Dr. Venora Maples still at bedside.

## 2014-11-27 NOTE — ED Notes (Signed)
Per [facility], pt fell in her driveway after tripping over a crevice. C/o right shoulder pain. 20g to [facility]. Given 100 mcg fentanyl. Pain 10/10 initially, reduced to 5/10 and now back to 10/10. Has a 2 in lac to right forehead.

## 2014-11-27 NOTE — ED Notes (Signed)
Oxygen turned down to 2 liters via [place]

## 2014-11-27 NOTE — ED Notes (Signed)
Pt returned from xray and is vomiting

## 2014-11-27 NOTE — ED Notes (Signed)
Pt was given something to drink and gulped it and currently still feels nauseated.

## 2014-11-27 NOTE — ED Notes (Signed)
Patient transported to CT 

## 2014-11-27 NOTE — Sedation Documentation (Signed)
Patient denies pain and is resting comfortably.  

## 2014-11-27 NOTE — ED Notes (Signed)
Pt transported back to xray for post reduction xray

## 2014-11-27 NOTE — ED Provider Notes (Signed)
CSN: [id number]     Arrival date & time 11/27/14  0746 History   First MD Initiated Contact with Patient 11/27/14 [phone]     Chief Complaint  Patient presents with  . Fall  . Shoulder Pain     (Consider location/radiation/quality/duration/timing/severity/associated sxs/prior Treatment) HPI  Meagan Ryan is a 60 y.o. female with PMH of DM, hyperlipidemia, hypertension, osteopenia on [place] presenting with fall this morning around 7 AM. Patient was walking in her driveway and reports tripping over a crevice. She reports falling on her right shoulder. She also has a 4 cm laceration to right forhead. She denies loss of consciousness, nausea, vomiting, weakness. Daughter at bedside who reports she has not been confused or altered. Patient complaining of her shoulder pain and denies headache or neck pain at this time. Shoulder pain is severe and she is unable to describe it. It is constant and not improving. EMS gave her 100 fentanyl which she reports is not touching her pain. No chest pain or shortness of breath. No leg pain.   Past Medical History  Diagnosis Date  . Pelvic pain in female   . Hypertension   . History of IBS   . History of hyperlipidemia   . H/O allergic rhinitis   . H/O osteopenia 2009  . HX: breast cancer   . Tubular adenoma of colon   . H/O fibromyalgia   . H/O actinic keratosis   . Dysuria 2011  . PMB (postmenopausal bleeding) 2011  . H/O peripheral neuropathy   . Diabetes mellitus   . Hx of colonic polyps   . Hx of migraines   . Asthma   . H/O varicose veins   . Hx of ovarian cyst   . Pregnancy induced hypertension   . PMB (postmenopausal bleeding) 2011  . Hyperlipidemia   . Breast cancer 1996    stage II infiltrating ductal carcinoma, 3/8 axillary lymph nodes were positive for metastatic carcinoma, getting yearly mammogram and every other year MRI followed by oncologist Dr Benay Spice  . Allergic rhinitis   . Right-sided lacunar infarction     MRI 1.2012  .  Fatty liver CT 1/12    elevated LFT  . Incomplete RBBB   . DCM (dilated cardiomyopathy)    Past Surgical History  Procedure Laterality Date  . Gastroc muscle repair  2007  . Mastectomy  06/1995    Left - for infiltrating ductal carcinoma  . Tubal ligation     Family History  Problem Relation Age of Onset  . Heart disease Mother     MI  . Stroke Mother   . Diabetes Mother   . Hypertension Mother   . Cancer Father     Prostate  . Cancer Sister     Colon   History  Substance Use Topics  . Smoking status: Never Smoker   . Smokeless tobacco: Not on file  . Alcohol Use: No   OB History    Gravida Para Term Preterm AB TAB SAB Ectopic Multiple Living   1 1 1       1      Review of Systems 10 Systems reviewed and are negative for acute change except as noted in the HPI.    Allergies  Norvasc; Cephalexin; Codeine; Noroxin; Sulfa antibiotics; and Penicillins  Home Medications   Prior to Admission medications   Medication Sig Start Date End Date Taking? Authorizing Provider  ALPRAZolam Duanne Moron) 0.5 MG tablet Take 0.5 mg by mouth at bedtime as  needed.   Yes Historical Provider, MD  aspirin 81 MG tablet Take 81 mg by mouth daily.   Yes Historical Provider, MD  cetirizine (ZYRTEC) 10 MG tablet Take 10 mg by mouth daily.   Yes Historical Provider, MD  Cholecalciferol (VITAMIN D-3 PO) Take by mouth 2 (two) times daily.    Yes Historical Provider, MD  DULoxetine (CYMBALTA) 30 MG capsule Take 30 mg by mouth daily.   Yes Historical Provider, MD  fish oil-omega-3 fatty acids 1000 MG capsule Take 2 g by mouth 2 (two) times daily.    Yes Historical Provider, MD  fluticasone (FLOVENT DISKUS) 50 MCG/BLIST diskus inhaler Inhale 2 puffs into the lungs daily.   Yes Historical Provider, MD  hydrochlorothiazide (HYDRODIURIL) 25 MG tablet Take 25 mg by mouth daily.   Yes Historical Provider, MD  niacin (NIASPAN) 500 MG CR tablet Take 500 mg by mouth every morning.    Yes Historical Provider, MD   olmesartan (BENICAR) 40 MG tablet Take 40 mg by mouth daily.    Yes Historical Provider, MD  omeprazole (PRILOSEC) 10 MG capsule Take 10 mg by mouth daily.   Yes Historical Provider, MD  potassium chloride (K-DUR) 10 MEQ tablet Take 10 mEq by mouth daily.   Yes Historical Provider, MD  raloxifene (EVISTA) 60 MG tablet Take 60 mg by mouth daily.   Yes Historical Provider, MD  meclizine (ANTIVERT) 12.5 MG tablet Take 12.5 mg by mouth 3 (three) times daily as needed for dizziness.    Historical Provider, MD  ondansetron (ZOFRAN ODT) 8 MG disintegrating tablet Take 1 tablet (8 mg total) by mouth every 8 (eight) hours as needed for nausea or vomiting. 11/27/14   Hoy Morn, MD  oxyCODONE-acetaminophen (PERCOCET/ROXICET) 5-325 MG per tablet Take 1 tablet by mouth every 4 (four) hours as needed for severe pain. 11/27/14   Hoy Morn, MD   BP 123/72 mmHg  Pulse 92  Temp(Src) 98 F (36.7 C) (Oral)  Resp 20  Ht 5\' 4"  (1.626 m)  Wt 220 lb (99.791 kg)  BMI 37.74 kg/m2  SpO2 95% Physical Exam  Constitutional: She appears well-developed and well-nourished. No distress.  HENT:  Head: Normocephalic.  Mouth/Throat: Oropharynx is clear and moist.  5-6 cm linear right scalp laceration.  Eyes: Conjunctivae and EOM are normal. Pupils are equal, round, and reactive to light. Right eye exhibits no discharge. Left eye exhibits no discharge.  Neck: Normal range of motion. Neck supple.  No nuchal rigidity  Cardiovascular: Normal rate, regular rhythm and normal heart sounds.   Pulmonary/Chest: Effort normal and breath sounds normal. No respiratory distress. She has no wheezes.  Abdominal: Soft. Bowel sounds are normal. She exhibits no distension. There is no tenderness.  Neurological: She is alert. No cranial nerve deficit. She exhibits normal muscle tone. Coordination normal.  Speech is clear and goal oriented. Peripheral visual fields intact. Strength 5/5 in upper and lower extremities. Sensation  intact.  No pronator drift. Normal, steady gait.   Skin: Skin is warm and dry. She is not diaphoretic.  Nursing note and vitals reviewed.   ED Course  Reduction of dislocation Date/Time: 11/27/2014 5:21 PM Performed by: Damaris Geers L Authorized by: Ladesha Pacini, Jordan Hawks L Consent: Verbal consent obtained. Written consent obtained. Risks and benefits: risks, benefits and alternatives were discussed Consent given by: patient Patient understanding: patient states understanding of the procedure being performed Patient consent: the patient's understanding of the procedure matches consent given Procedure consent: procedure consent matches procedure  scheduled Imaging studies: imaging studies available Required items: required blood products, implants, devices, and special equipment available Patient identity confirmed: verbally with patient and arm band Time out: Immediately prior to procedure a "time out" was called to verify the correct patient, procedure, equipment, support staff and site/side marked as required. Local anesthesia used: no Patient sedated: yes Sedatives: etomidate Vitals: Vital signs were monitored during sedation. Patient tolerance: Patient tolerated the procedure well with no immediate complications Comments: Upper extremity neurovascularly intact after reduction and sling placement   (including critical care time) Labs Review Labs Reviewed - No data to display  Imaging Review Dg Shoulder Right  11/27/2014   CLINICAL DATA:  Status post reduction of right shoulder dislocation  EXAM: RIGHT SHOULDER - 2+ VIEW  COMPARISON:  11/27/2014  FINDINGS: There is been reduction of the previously seen anterior inferior dislocation of the humeral head. The fragment from the greater tuberosity appears to have relocated itself without difficulty. Mild right basilar atelectasis is noted to may be related to the inspiratory effort.  IMPRESSION: Reduction of previously seen dislocation.  The greater tuberosity fragment appears to have relocated itself along the proximal humerus.   Electronically Signed   By: Inez Catalina M.D.   On: 11/27/2014 10:15   Dg Shoulder Right  11/27/2014   CLINICAL DATA:  Right shoulder pain post fall this morning in her garage  EXAM: RIGHT SHOULDER - 2+ VIEW  COMPARISON:  None.  FINDINGS: Two views of the right shoulder submitted. There is anterior dislocation of humeral head. Mild displaced fracture of greater humeral tuberosity.  IMPRESSION: Anterior subluxation of humeral head. Displaced fracture of greater humeral tuberosity.   Electronically Signed   By: Lahoma Crocker M.D.   On: 11/27/2014 08:35   Ct Head Wo Contrast  11/27/2014   CLINICAL DATA:  Fall in driveway, right shoulder pain  EXAM: CT HEAD WITHOUT CONTRAST  CT CERVICAL SPINE WITHOUT CONTRAST  TECHNIQUE: Multidetector CT imaging of the head and cervical spine was performed following the standard protocol without intravenous contrast. Multiplanar CT image reconstructions of the cervical spine were also generated.  COMPARISON:  Brain MRI 04/27/2012  FINDINGS: CT HEAD FINDINGS  No skull fracture is noted. There is mucosal thickening with partial opacification inferior aspect of the right maxillary sinus.  The mastoid air cells are unremarkable.  Mild cerebral atrophy. No intracranial hemorrhage, mass effect or midline shift. No acute cortical infarction. No mass lesion is noted on this unenhanced scan.  CT CERVICAL SPINE FINDINGS  Axial images of the cervical spine shows no acute fracture or subluxation. There is no pneumothorax in visualized lung apices. Alignment, disc spaces and vertebral body heights are preserved. Minimal disc space flattening with anterior spurring at C7-T1 level. No prevertebral soft tissue swelling. Cervical airway is patent. Spinal canal is patent. Computer processed images shows no acute fracture or subluxation.  IMPRESSION: 1. No acute intracranial abnormality. Mild cerebral  atrophy. There is mucosal thickening with partial opacification inferior aspect of the right maxillary sinus. 2. No cervical spine acute fracture or subluxation. Minimal degenerative changes at C7-T1 level. No prevertebral soft tissue swelling. Cervical airway is patent.   Electronically Signed   By: Lahoma Crocker M.D.   On: 11/27/2014 09:19   Ct Cervical Spine Wo Contrast  11/27/2014   CLINICAL DATA:  Fall in driveway, right shoulder pain  EXAM: CT HEAD WITHOUT CONTRAST  CT CERVICAL SPINE WITHOUT CONTRAST  TECHNIQUE: Multidetector CT imaging of the head and cervical spine was  performed following the standard protocol without intravenous contrast. Multiplanar CT image reconstructions of the cervical spine were also generated.  COMPARISON:  Brain MRI 04/27/2012  FINDINGS: CT HEAD FINDINGS  No skull fracture is noted. There is mucosal thickening with partial opacification inferior aspect of the right maxillary sinus.  The mastoid air cells are unremarkable.  Mild cerebral atrophy. No intracranial hemorrhage, mass effect or midline shift. No acute cortical infarction. No mass lesion is noted on this unenhanced scan.  CT CERVICAL SPINE FINDINGS  Axial images of the cervical spine shows no acute fracture or subluxation. There is no pneumothorax in visualized lung apices. Alignment, disc spaces and vertebral body heights are preserved. Minimal disc space flattening with anterior spurring at C7-T1 level. No prevertebral soft tissue swelling. Cervical airway is patent. Spinal canal is patent. Computer processed images shows no acute fracture or subluxation.  IMPRESSION: 1. No acute intracranial abnormality. Mild cerebral atrophy. There is mucosal thickening with partial opacification inferior aspect of the right maxillary sinus. 2. No cervical spine acute fracture or subluxation. Minimal degenerative changes at C7-T1 level. No prevertebral soft tissue swelling. Cervical airway is patent.   Electronically Signed   By: Lahoma Crocker M.D.   On: 11/27/2014 09:19     EKG Interpretation None      LACERATION REPAIR Performed by: [place] by: Pura Spice Consent: Verbal consent obtained. Risks and benefits: risks, benefits and alternatives were discussed Consent given by: patient Patient identity confirmed: provided demographic data Prepped and Draped in normal sterile fashion Wound explored  Laceration Location: right anterior scalp  Laceration Length: 5-6 cm  No Foreign Bodies seen or palpated  Anesthesia: during procedural sedation  Local anesthetic: none  Anesthetic total: during procedural sedation  Irrigation method: syringe Amount of cleaning: standard  Skin closure: staples  Number of sutures: 5  Patient tolerance: Patient tolerated the procedure well with no immediate complications.    Procedural sedation. Consent signed by daughter. Pt unable to sign due to being right handed with right anterior shoulder dislocation. Etomidate weightbase 1 dose. Preprocedure  Pre-anesthesia/induction confirmation of laterality/correct procedure site including "time-out."  Provider confirms review of the nurses' note, allergies, medications, pertinent labs, PMH, pre-induction vital signs, pulse oximetry, pain level, and ECG (as applicable), and patient condition satisfactory for commencing with order for sedation and procedure.  Total time sedation/monitoring: 30 minutes  Patient tolerated procedure and procedural sedation component as expected without apparent immediate complications.  Physician/provider confirms procedural medication orders as administered, patient was assessed by physician and PA post-procedure, and confirms post-sedation plan of care and disposition.   MDM   Final diagnoses:  Fall  Shoulder dislocation, right, initial encounter  Fracture of greater tuberosity of humerus, right, closed, initial encounter  Head injury, initial encounter  Scalp  laceration, initial encounter   Patient presenting after mechanical fall with right anterior shoulder dislocation as well as fracture of greater tuberosity displaced, diagnosed by preprocedure x-ray. Right upper extremity neurovascularly intact. Patient's pain managed in ED. Procedural sedation used for anterior shoulder relocation performed by myself under the supervision of Dr. Venora Maples. Scalp laceration irrigated and cleansed preprocedure. Staples placed during patient sedation. She tolerated procedure well. Pt daughter works in Programme researcher, broadcasting/film/video office with Dr. Lorin Mercy. She has scheduled follow-up appointment. Pt right upper extremity neurovascularly intact after reduction and sling placement. Post reduction xray with successful reduction and fractured greater tuberosity appears to have relocated on humerus. Pt tolerating fluids without N/V and with steady gait.  VSS. Patient is afebrile, nontoxic, and in no acute distress. Patient is appropriate for outpatient management and is stable for discharge.  Discussed return precautions with patient. Discussed all results and patient verbalizes understanding and agrees with plan.  This is a shared patient. This patient was discussed with the physician, Dr. Venora Maples who saw and evaluated the patient and agrees with the plan.      Pura Spice, PA-C 11/28/14 [place], MD 11/29/14 [phone]

## 2014-12-09 ENCOUNTER — Other Ambulatory Visit: Payer: Self-pay | Admitting: Orthopaedic Surgery

## 2014-12-09 DIAGNOSIS — M25511 Pain in right shoulder: Secondary | ICD-10-CM

## 2014-12-20 ENCOUNTER — Other Ambulatory Visit

## 2015-01-03 ENCOUNTER — Ambulatory Visit (INDEPENDENT_AMBULATORY_CARE_PROVIDER_SITE_OTHER): Admitting: Physical Therapy

## 2015-01-03 ENCOUNTER — Encounter: Payer: Self-pay | Admitting: Physical Therapy

## 2015-01-03 DIAGNOSIS — M25611 Stiffness of right shoulder, not elsewhere classified: Secondary | ICD-10-CM

## 2015-01-03 DIAGNOSIS — R293 Abnormal posture: Secondary | ICD-10-CM

## 2015-01-03 DIAGNOSIS — M2550 Pain in unspecified joint: Secondary | ICD-10-CM

## 2015-01-03 DIAGNOSIS — R29898 Other symptoms and signs involving the musculoskeletal system: Secondary | ICD-10-CM

## 2015-01-03 NOTE — Patient Instructions (Signed)
ROM: External / Internal Rotation - Wand   Holding wand with left hand palm up, push out from body with other hand, palm down. Keep both elbows bent. When stretch is felt, hold _1___ seconds. Repeat to other side, leading with same hand. Keep elbows bent. Repeat _10___ times per set. Do __1__ sets per session. Do 1-3____ sessions per day.  http://orth.exer.us/748   Copyright  VHI. All rights reserved.  ROM: Flexion - Wand (Supine)   Lie on back holding wand. First press arms up to the ceiling. Raise arms over head.  Repeat __10__ times per set. Do __1__ sets per session. Do __1-3__ sessions per day.  http://orth.exer.us/928   Copyright  VHI. All rights reserved.  ROM: Flexion (Alternate)   Slide right arm up wall, with palm out, by leaning toward wall. Hold _1___ seconds. Repeat __10__ times per set. Do __1__ sets per session. Do __1-3__ sessions per day.  http://orth.exer.us/758   Copyright  VHI. All rights reserved.  ROM: Pendulum (Circular)   Let right arm move in circle clockwise, then counterclockwise, by rocking body weight in circular pattern. Circle _10___ times each direction per set. Do __1__ sets per session. Do _1-3___ sessions per day.  http://orth.exer.us/794   Copyright  VHI. All rights reserved.  Strengthening: Isometric External Rotation   Keeping left elbow at side, use other hand at wrist to apply light resistance to outward motion. Hold __3__ seconds. Repeat _10___ times per set. Do __1__ sets per session. Do _1-3___ sessions per day.  http://orth.exer.us/812   Copyright  VHI. All rights reserved.  Strengthening: Isometric Internal Rotation   Keeping right elbow at side, use other hand at wrist to apply light resistance to inward motion. Hold __5__ seconds. Repeat __10__ times per set. Do _1___ sets per session. Do _1-3___ sessions per day.  http://orth.exer.us/818   Copyright  VHI. All rights reserved.  Towel Roll Squeeze   With  right forearm resting on surface, gently squeeze towel. Repeat __10-20__ times per set. Do __1__ sets per session. Do __1-3__ sessions per day.  Copyright  VHI. All rights reserved.

## 2015-01-03 NOTE — Therapy (Addendum)
[place], [place], [address] Phone: [phone]   Fax:  [phone]  Physical Therapy Evaluation  Patient Details  Name: Meagan Ryan MRN: [id number] Date of Birth: [birth year] Referring Provider:  Marianna Payment, MD  Encounter Date: 01/03/2015      PT End of Session - 01/03/15 1057    Visit Number 1   Number of Visits 8   Date for PT Re-Evaluation 01/31/15   PT Start Time 3354   PT Stop Time 5625   PT Time Calculation (min) 59 min   Activity Tolerance Patient tolerated treatment well      Past Medical History  Diagnosis Date  . Pelvic pain in female   . Hypertension   . History of IBS   . History of hyperlipidemia   . H/O allergic rhinitis   . H/O osteopenia 2009  . HX: breast cancer   . Tubular adenoma of colon   . H/O fibromyalgia   . H/O actinic keratosis   . Dysuria 2011  . PMB (postmenopausal bleeding) 2011  . H/O peripheral neuropathy   . Diabetes mellitus   . Hx of colonic polyps   . Hx of migraines   . Asthma   . H/O varicose veins   . Hx of ovarian cyst   . Pregnancy induced hypertension   . PMB (postmenopausal bleeding) 2011  . Hyperlipidemia   . Breast cancer 1996    stage II infiltrating ductal carcinoma, 3/8 axillary lymph nodes were positive for metastatic carcinoma, getting yearly mammogram and every other year MRI followed by oncologist Dr Benay Spice  . Allergic rhinitis   . Right-sided lacunar infarction     MRI 1.2012  . Fatty liver CT 1/12    elevated LFT  . Incomplete RBBB   . DCM (dilated cardiomyopathy)     Past Surgical History  Procedure Laterality Date  . Gastroc muscle repair  2007  . Mastectomy  06/1995    Left - for infiltrating ductal carcinoma  . Tubal ligation      There were no vitals taken for this visit.  Visit Diagnosis:  Stiffness of shoulder joint, right - Plan: PT plan of care cert/re-cert  Weakness of shoulder - Plan: PT  plan of care cert/re-cert  Pain, joint, multiple sites - Plan: PT plan of care cert/re-cert  Abnormal posture - Plan: PT plan of care cert/re-cert      Subjective Assessment - 01/03/15 1100    Symptoms Patient fell 11/27/14, she went outside towarm up her car, got out and that's all she remembers.  Hit her head, had a laceration and Rt shoulder pain.   Pertinent History Rt shoulder dislocated, was in a sling for 1 wk, too uncomfortable so she held the arm still. Has been using her Rt UE as much as she is able. Is Rt hand dominant. Questionable RTC tear. MD wishes her to have PT and see if her arms gets bewtter.   Patient Stated Goals pt wishes to reach her arm over head again, reach behind, pick up items and get rid of her pain   Currently in Pain? Yes   Pain Score 5    Pain Location Shoulder   Pain Orientation Right   Pain Descriptors / Indicators Dull   Pain Type Acute pain   Pain Onset More than a month ago   Pain Frequency Intermittent   Aggravating Factors  lying on her Rt arm, laundry, difficult with pull  over shirts, styiling hair.    Pain Relieving Factors heat   Multiple Pain Sites Yes   Pain Score 7   Pain Type Acute pain   Pain Location Neck   Pain Descriptors / Indicators Aching;Dull   Pain Frequency Constant   Pain Onset On-going          [facility] PT Assessment - 01/03/15 0001    Assessment   Medical Diagnosis Rt shoulder dislocation, greater tuberosity fx   Onset Date 11/27/14   Next MD Visit 02/07/15   Balance Screen   Has the patient fallen in the past 6 months Yes   How many times? 1  from the injury   Has the patient had a decrease in activity level because of a fear of falling?  No   Is the patient reluctant to leave their home because of a fear of falling?  No   Prior Function   Level of Independence --  I with all activities of living.    Observation/Other Assessments   Focus on Therapeutic Outcomes (FOTO)  49% limited   Posture/Postural Control    Posture/Postural Control Postural limitations   Postural Limitations Forward head;Rounded Shoulders;Increased thoracic kyphosis   ROM / Strength   AROM / PROM / Strength AROM;PROM;Strength   AROM   AROM Assessment Site Cervical;Shoulder  Lt UE WNL   Right/Left Shoulder Right   Right Shoulder Extension 58 Degrees   Right Shoulder Flexion 108 Degrees   Right Shoulder ABduction 57 Degrees   Right Shoulder Internal Rotation --  WNL   Right Shoulder External Rotation 18 Degrees   Cervical Flexion WNL   Cervical Extension WNL   Cervical - Right Side Bend WNL   Cervical - Left Side Bend WNL   Cervical - Right Rotation 85   Cervical - Left Rotation 75   PROM   PROM Assessment Site Shoulder   Right/Left Shoulder Right   Right Shoulder Flexion 158 Degrees   Right Shoulder ABduction 115 Degrees   Right Shoulder Internal Rotation --  WNL   Right Shoulder External Rotation 30 Degrees   Strength   Strength Assessment Site Shoulder;Hand   Right/Left Shoulder Right   Right Shoulder Flexion 3-/5   Right Shoulder Extension 4/5   Right Shoulder ABduction 3-/5   Right Shoulder Internal Rotation 4+/5   Right Shoulder External Rotation 3+/5   Right/Left hand Left;Right   Grip (lbs) 55#   Grip (lbs) 55#                          PT Education - 01/03/15 1147    Education provided Yes   Education Details HEP   Person(s) Educated Patient   Methods Explanation;Demonstration;Handout   Comprehension Returned demonstration             PT Long Term Goals - 01/03/15 1151    PT LONG TERM GOAL #1   Title I with advanced HEP   Time 4   Period Weeks   Status New   PT LONG TERM GOAL #2   Title increase Rt shoulder ROM to [facility]   Time 4   Period Weeks   Status New   PT LONG TERM GOAL #3   Title increase strength Rt UE =/> 5-/5   Time 4   Period Weeks   Status New   PT LONG TERM GOAL #4   Title improve FOTO =/< 34% limited   Time 4   Period Weeks  Status New   PT  LONG TERM GOAL #5   Title perform her house hold activities per her baseline   Time 4   Period Weeks   Status New   Additional Long Term Goals   Additional Long Term Goals Yes   PT LONG TERM GOAL #6   Title decrease pain in neck and Rt shoulder =/> 75% with movement   Time 4   Period Weeks   Status New      vasopnuematic to shoulder x 15 min, med pressure, 3*         Plan - 01/03/15 1148    Clinical Impression Statement Patient presents s/p Rt shoulder dislocation and fx. Very motivated to return to prior level of function. Has compensatory shoulder motion that is limited at this time along with weakness and postural changes.    Pt will benefit from skilled therapeutic intervention in order to improve on the following deficits Decreased range of motion;Impaired flexibility;Postural dysfunction;Impaired UE functional use;Decreased strength   Rehab Potential Excellent   PT Frequency 2x / week   PT Duration 4 weeks   PT Treatment/Interventions Electrical Stimulation;Functional mobility training;Cryotherapy;Ultrasound;Manual techniques;Passive range of motion;Therapeutic exercise;Moist Heat;Therapeutic activities;Patient/family education   PT Next Visit Plan Progress ROM Rt shoulder, add scapular stab ex   Consulted and Agree with Plan of Care Patient         Problem List Patient Active Problem List   Diagnosis Date Noted  . Benign essential HTN 09/18/2014  . RBBB 09/18/2014  . Incomplete RBBB   . DCM (dilated cardiomyopathy) 04/24/2014  . Meniere disease 06/02/2012    Jeral Pinch, PT 01/03/2015, 12:00 PM  [facility] [place], [place], [address] Phone: [phone]   Fax:  [phone]

## 2015-01-06 ENCOUNTER — Encounter: Admitting: Physical Therapy

## 2015-01-07 ENCOUNTER — Encounter: Admitting: Physical Therapy

## 2015-01-13 ENCOUNTER — Ambulatory Visit (INDEPENDENT_AMBULATORY_CARE_PROVIDER_SITE_OTHER): Admitting: Physical Therapy

## 2015-01-13 ENCOUNTER — Encounter: Payer: Self-pay | Admitting: Physical Therapy

## 2015-01-13 DIAGNOSIS — R293 Abnormal posture: Secondary | ICD-10-CM

## 2015-01-13 DIAGNOSIS — R29898 Other symptoms and signs involving the musculoskeletal system: Secondary | ICD-10-CM

## 2015-01-13 DIAGNOSIS — M25611 Stiffness of right shoulder, not elsewhere classified: Secondary | ICD-10-CM

## 2015-01-13 DIAGNOSIS — M2550 Pain in unspecified joint: Secondary | ICD-10-CM

## 2015-01-13 NOTE — Therapy (Signed)
[place], [place], [address] Phone: [phone]   Fax:  [phone]  Physical Therapy Treatment  Patient Details  Name: Meagan Ryan MRN: [id number] Date of Birth: 02/22/1956 Referring Provider:  Marianna Payment, MD  Encounter Date: 01/13/2015      PT End of Session - 01/13/15 1000    Visit Number 2   Number of Visits 8   Date for PT Re-Evaluation 01/31/15   PT Start Time 0925   PT Stop Time 1015   PT Time Calculation (min) 50 min      Past Medical History  Diagnosis Date  . Pelvic pain in female   . Hypertension   . History of IBS   . History of hyperlipidemia   . H/O allergic rhinitis   . H/O osteopenia 2009  . HX: breast cancer   . Tubular adenoma of colon   . H/O fibromyalgia   . H/O actinic keratosis   . Dysuria 2011  . PMB (postmenopausal bleeding) 2011  . H/O peripheral neuropathy   . Diabetes mellitus   . Hx of colonic polyps   . Hx of migraines   . Asthma   . H/O varicose veins   . Hx of ovarian cyst   . Pregnancy induced hypertension   . PMB (postmenopausal bleeding) 2011  . Hyperlipidemia   . Breast cancer 1996    stage II infiltrating ductal carcinoma, 3/8 axillary lymph nodes were positive for metastatic carcinoma, getting yearly mammogram and every other year MRI followed by oncologist Dr Benay Spice  . Allergic rhinitis   . Right-sided lacunar infarction     MRI 1.2012  . Fatty liver CT 1/12    elevated LFT  . Incomplete RBBB   . DCM (dilated cardiomyopathy)     Past Surgical History  Procedure Laterality Date  . Gastroc muscle repair  2007  . Mastectomy  06/1995    Left - for infiltrating ductal carcinoma  . Tubal ligation      There were no vitals taken for this visit.  Visit Diagnosis:  Stiffness of shoulder joint, right  Weakness of shoulder  Pain, joint, multiple sites  Abnormal posture      Subjective Assessment - 01/13/15 0926    Symptoms still having pain in Rt lateral UE where break occured with all movements. pt reports she has been compliant with HEP.   Currently in Pain? Yes   Pain Score 8    Pain Location Shoulder   Pain Orientation Right   Pain Descriptors / Indicators Aching   Pain Type Acute pain   Pain Frequency Intermittent          [facility] PT Assessment - 01/13/15 0001    PROM   Right Shoulder Flexion 150 Degrees   Right Shoulder ABduction 111 Degrees   Right Shoulder External Rotation 20 Degrees   Strength   Right Shoulder Flexion 3-/5   Right Shoulder ABduction 3-/5   Right Shoulder External Rotation 3-/5                  [facility] Adult PT Treatment/Exercise - 01/13/15 0001    Exercises   Exercises Shoulder   Shoulder Exercises: Seated   Retraction AROM;Strengthening;Both;20 reps  tactile cues for technique/posture   Shoulder Exercises: Pulleys   Flexion Other (comment)  10x 5 sec hold   ABduction --  10x 5 sec hold   Shoulder Exercises: ROM/Strengthening   Other ROM/Strengthening Exercises --  wall ladder x 10 flex, x 10 abd cues for technique   Other ROM/Strengthening Exercises pendulum x 10   Shoulder Exercises: Isometric Strengthening   Flexion --  10x ball on wall   External Rotation --  10 x ball on wall   ABduction --  10x ball on wall   Modalities   Modalities --  vaso 2** med pressure x 15 minutes   Manual Therapy   Manual Therapy Passive ROM   Passive ROM shoulder all motions                     PT Long Term Goals - 01/13/15 1001    PT LONG TERM GOAL #1   Title I with advanced HEP   Time 4   Period Weeks   Status On-going   PT LONG TERM GOAL #2   Title increase Rt shoulder ROM to [facility]   Time 4   Period Weeks   Status On-going   PT LONG TERM GOAL #3   Title increase strength Rt UE =/> 5-/5   Time 4   Period Weeks   Status On-going   PT LONG TERM GOAL #4   Title improve FOTO =/< 34% limited   Time 4   Period Weeks   Status  On-going   PT LONG TERM GOAL #5   Title perform her house hold activities per her baseline   Time 4   Period Weeks   Status On-going               Plan - 01/13/15 1000    Clinical Impression Statement pt continues with decreased strength and ROM, plan to continue to progress toward PT goals   Pt will benefit from skilled therapeutic intervention in order to improve on the following deficits Decreased range of motion;Impaired flexibility;Postural dysfunction;Impaired UE functional use;Decreased strength   Rehab Potential Excellent   PT Frequency 2x / week   PT Duration 4 weeks   PT Treatment/Interventions Electrical Stimulation;Functional mobility training;Cryotherapy;Ultrasound;Manual techniques;Passive range of motion;Therapeutic exercise;Moist Heat;Therapeutic activities;Patient/family education   PT Next Visit Plan prgoress ROM, continue scap stab   Consulted and Agree with Plan of Care Patient        Problem List Patient Active Problem List   Diagnosis Date Noted  . Benign essential HTN 09/18/2014  . RBBB 09/18/2014  . Incomplete RBBB   . DCM (dilated cardiomyopathy) 04/24/2014  . Meniere disease 06/02/2012    Isabelle Course, PT DPT  01/13/2015, 10:03 AM  [facility] [place], [place], [address] Phone: [phone]   Fax:  [phone]

## 2015-01-15 ENCOUNTER — Ambulatory Visit (INDEPENDENT_AMBULATORY_CARE_PROVIDER_SITE_OTHER): Admitting: Physical Therapy

## 2015-01-15 DIAGNOSIS — R29898 Other symptoms and signs involving the musculoskeletal system: Secondary | ICD-10-CM

## 2015-01-15 DIAGNOSIS — M2550 Pain in unspecified joint: Secondary | ICD-10-CM

## 2015-01-15 DIAGNOSIS — M25611 Stiffness of right shoulder, not elsewhere classified: Secondary | ICD-10-CM

## 2015-01-15 DIAGNOSIS — R293 Abnormal posture: Secondary | ICD-10-CM

## 2015-01-15 NOTE — Therapy (Signed)
[place], [place], [address] Phone: [phone]   Fax:  [phone]  Physical Therapy Treatment  Patient Details  Name: Meagan Ryan MRN: [id number] Date of Birth: 1956-05-20 Referring Provider:  Leandrew Koyanagi, MD  Encounter Date: 01/15/2015      PT End of Session - 01/15/15 1656    Visit Number 3   Number of Visits 8   Date for PT Re-Evaluation 01/31/15   PT Start Time 0932   PT Stop Time 1025   PT Time Calculation (min) 53 min   Activity Tolerance Patient tolerated treatment well      Past Medical History  Diagnosis Date  . Pelvic pain in female   . Hypertension   . History of IBS   . History of hyperlipidemia   . H/O allergic rhinitis   . H/O osteopenia 2009  . HX: breast cancer   . Tubular adenoma of colon   . H/O fibromyalgia   . H/O actinic keratosis   . Dysuria 2011  . PMB (postmenopausal bleeding) 2011  . H/O peripheral neuropathy   . Diabetes mellitus   . Hx of colonic polyps   . Hx of migraines   . Asthma   . H/O varicose veins   . Hx of ovarian cyst   . Pregnancy induced hypertension   . PMB (postmenopausal bleeding) 2011  . Hyperlipidemia   . Breast cancer 1996    stage II infiltrating ductal carcinoma, 3/8 axillary lymph nodes were positive for metastatic carcinoma, getting yearly mammogram and every other year MRI followed by oncologist Dr Benay Spice  . Allergic rhinitis   . Right-sided lacunar infarction     MRI 1.2012  . Fatty liver CT 1/12    elevated LFT  . Incomplete RBBB   . DCM (dilated cardiomyopathy)     Past Surgical History  Procedure Laterality Date  . Gastroc muscle repair  2007  . Mastectomy  06/1995    Left - for infiltrating ductal carcinoma  . Tubal ligation      There were no vitals taken for this visit.  Visit Diagnosis:  Stiffness of shoulder joint, right  Pain, joint, multiple sites  Abnormal posture  Weakness of shoulder       Subjective Assessment - 01/15/15 0933    Symptoms Pt still having difficulty with ADLs; painful.  Pt reports using LUE a lot to compensate.    Patient Stated Goals pt wishes to reach her arm over head again, reach behind, pick up items and get rid of her pain   Currently in Pain? Yes   Pain Score 6    Pain Location Shoulder   Pain Orientation Right   Pain Descriptors / Indicators Sharp   Aggravating Factors  doing laundry, donning clothes, laying on it.    Pain Relieving Factors ice, heat           [facility] PT Assessment - 01/15/15 0001    Assessment   Medical Diagnosis Rt shoulder dislocation, greater tuberosity fx   Onset Date 11/27/14   Next MD Visit 02/07/15   AROM   AROM Assessment Site Shoulder   Right/Left Shoulder Right   Right Shoulder Flexion 120 Degrees  degrees, supine   Right Shoulder ABduction 85 Degrees  degrees, supine   Right Shoulder Internal Rotation --  WNL   Right Shoulder External Rotation 22 Degrees  degrees, supine  [facility] Adult PT Treatment/Exercise - 01/15/15 0001    Exercises   Exercises Shoulder   Shoulder Exercises: Supine   External Rotation AAROM;Right;12 reps  cane   Flexion AAROM;10 reps  Cane   Shoulder Exercises: Seated   Retraction AROM;Strengthening;Both;20 reps  tactile cues for technique/posture   Shoulder Exercises: Pulleys   Flexion 2 minutes   ABduction 2 minutes   Other Pulley Exercises IR x 2 min.    Shoulder Exercises: ROM/Strengthening   Other ROM/Strengthening Exercises --  wall ladder x 10 flex, x 10 abd cues for technique   Modalities   Modalities Cryotherapy   Cryotherapy   Number Minutes Cryotherapy 15 Minutes   Cryotherapy Location Shoulder  Rt   Type of Cryotherapy --  vaso, med pressure, 3 snowflakes.    Manual Therapy   Manual Therapy Passive ROM;Other (comment)   Passive ROM --  Rt shoulder ER with 30 sec holds x 8 reps   Other Manual Therapy TPR to R sub scap (with  contract/relax) ; pec release on Rt                      PT Long Term Goals - 01/13/15 1001    PT LONG TERM GOAL #1   Title I with advanced HEP   Time 4   Period Weeks   Status On-going   PT LONG TERM GOAL #2   Title increase Rt shoulder ROM to [facility]   Time 4   Period Weeks   Status On-going   PT LONG TERM GOAL #3   Title increase strength Rt UE =/> 5-/5   Time 4   Period Weeks   Status On-going   PT LONG TERM GOAL #4   Title improve FOTO =/< 34% limited   Time 4   Period Weeks   Status On-going   PT LONG TERM GOAL #5   Title perform her house hold activities per her baseline   Time 4   Period Weeks   Status On-going               Plan - 01/15/15 1657    Clinical Impression Statement Pt demonstrated slight increase in Rt shoulder ROM from last session.  Pt reported decrease in pain in Rt shoulder after manual therapy from  6/10 to 2/10. Pt making progress towards goals; no goals met yet.    Pt will benefit from skilled therapeutic intervention in order to improve on the following deficits Decreased range of motion;Impaired flexibility;Postural dysfunction;Impaired UE functional use;Decreased strength   Rehab Potential Excellent   PT Frequency 2x / week   PT Duration 4 weeks   PT Treatment/Interventions Electrical Stimulation;Functional mobility training;Cryotherapy;Ultrasound;Manual techniques;Passive range of motion;Therapeutic exercise;Moist Heat;Therapeutic activities;Patient/family education   PT Next Visit Plan Continue progressive ROM, continue scap stab   Consulted and Agree with Plan of Care Patient        Problem List Patient Active Problem List   Diagnosis Date Noted  . Benign essential HTN 09/18/2014  . RBBB 09/18/2014  . Incomplete RBBB   . DCM (dilated cardiomyopathy) 04/24/2014  . Meniere disease 06/02/2012   Kerin Perna, PTA 01/15/2015 5:06 PM  [place], [place], [address] Phone: [phone]   Fax:  [phone]

## 2015-01-15 NOTE — Patient Instructions (Addendum)
  External Rotation (Passive)   With elbow bent and forearm on table, palm down, bend forward at waist until a stretch is felt. Hold __15__ seconds. Repeat __10__ times. Do __1-2__ sessions per day.  Copyright  VHI. All rights reserved.  ROM: Abduction   With left arm resting on table, palm up, bring head down toward arm and simultaneously move trunk away from table. Hold _15___ seconds.  Repeat __10__ times per set. Do _1___ sets per session. Do __1-2__ sessions per day.  http://orth.exer.us/760   Copyright  VHI. All rights reserved

## 2015-01-21 ENCOUNTER — Ambulatory Visit (INDEPENDENT_AMBULATORY_CARE_PROVIDER_SITE_OTHER): Admitting: Physical Therapy

## 2015-01-21 DIAGNOSIS — M2550 Pain in unspecified joint: Secondary | ICD-10-CM

## 2015-01-21 DIAGNOSIS — M25611 Stiffness of right shoulder, not elsewhere classified: Secondary | ICD-10-CM

## 2015-01-21 DIAGNOSIS — R293 Abnormal posture: Secondary | ICD-10-CM

## 2015-01-21 DIAGNOSIS — R29898 Other symptoms and signs involving the musculoskeletal system: Secondary | ICD-10-CM

## 2015-01-21 NOTE — Patient Instructions (Signed)
Pt issued paper copy of shoulder strengthening: isometrics.  2 sets of 10 reps, holding contraction as described for 5-10 sec. 1x/day.

## 2015-01-21 NOTE — Therapy (Signed)
[place], [place], [address] Phone: [phone]   Fax:  [phone]  Physical Therapy Treatment  Patient Details  Name: Meagan Ryan MRN: [id number] Date of Birth: 10-01-1956 Referring Provider:  Leandrew Koyanagi, MD  Encounter Date: 01/21/2015      PT End of Session - 01/21/15 0936    Visit Number 4   Number of Visits 8   Date for PT Re-Evaluation 01/31/15   PT Start Time 0933   PT Stop Time 1030   PT Time Calculation (min) 57 min   Activity Tolerance Patient limited by pain      Past Medical History  Diagnosis Date  . Pelvic pain in female   . Hypertension   . History of IBS   . History of hyperlipidemia   . H/O allergic rhinitis   . H/O osteopenia 2009  . HX: breast cancer   . Tubular adenoma of colon   . H/O fibromyalgia   . H/O actinic keratosis   . Dysuria 2011  . PMB (postmenopausal bleeding) 2011  . H/O peripheral neuropathy   . Diabetes mellitus   . Hx of colonic polyps   . Hx of migraines   . Asthma   . H/O varicose veins   . Hx of ovarian cyst   . Pregnancy induced hypertension   . PMB (postmenopausal bleeding) 2011  . Hyperlipidemia   . Breast cancer 1996    stage II infiltrating ductal carcinoma, 3/8 axillary lymph nodes were positive for metastatic carcinoma, getting yearly mammogram and every other year MRI followed by oncologist Dr Benay Spice  . Allergic rhinitis   . Right-sided lacunar infarction     MRI 1.2012  . Fatty liver CT 1/12    elevated LFT  . Incomplete RBBB   . DCM (dilated cardiomyopathy)     Past Surgical History  Procedure Laterality Date  . Gastroc muscle repair  2007  . Mastectomy  06/1995    Left - for infiltrating ductal carcinoma  . Tubal ligation      There were no vitals filed for this visit.  Visit Diagnosis:  Stiffness of shoulder joint, right  Pain, joint, multiple sites  Weakness of shoulder  Abnormal posture       Subjective Assessment - 01/21/15 0937    Symptoms Pt reports no changes since last visit. "It still hurts".    Pain Score 5    Pain Location Shoulder   Pain Orientation Right;Lateral   Pain Descriptors / Indicators Nagging   Aggravating Factors  doing laundry, donning clothes, laying on it.    Pain Relieving Factors ice, heat            [facility] PT Assessment - 01/21/15 0001    Assessment   Medical Diagnosis Rt shoulder dislocation, greater tuberosity fx   Onset Date 11/27/14   Next MD Visit 02/07/15   AROM   AROM Assessment Site Shoulder   Right/Left Shoulder Right   Right Shoulder Flexion 125 Degrees  degrees, supine   Right Shoulder ABduction 100 Degrees  degrees, supine (scaption)   Right Shoulder Internal Rotation --  6" difference from LUE, hand behind back   Right Shoulder External Rotation 10 Degrees  degrees with shoulder ABD 45 deg.  After manual 30 deg.                   [facility] Adult PT Treatment/Exercise - 01/21/15 0001    Exercises   Exercises  Shoulder   Shoulder Exercises: Supine   Flexion Strengthening;Right;10 reps  2 sets   Shoulder Flexion Weight (lbs) 1  slow controlled descent to mat   Shoulder Exercises: Sidelying   External Rotation Strengthening;Right;10 reps  2 sets   External Rotation Weight (lbs) --  1# 1st set, 2# 2nd set   ABduction 10 reps;Strengthening;Right  2 sets    ABduction Weight (lbs) --  1# 1st set, 2# 2nd set   Shoulder Exercises: Pulleys   Flexion 2 minutes   ABduction 2 minutes   Other Pulley Exercises IR x 2 min.    Shoulder Exercises: ROM/Strengthening   UBE (Upper Arm Bike) Level 1, 1 min each direction   Cryotherapy   Number Minutes Cryotherapy 15 Minutes   Cryotherapy Location Shoulder   Type of Cryotherapy --  vaso, med pressure, 3 snowflakes    Manual Therapy   Manual Therapy Joint mobilization   Joint Mobilization grade 3 ap and inf mob to [facility], occilations into ER. Able to increase ER to 30 deg.                  PT Education - 01/21/15 1130    Education provided Yes   Education Details HEP   Person(s) Educated Patient   Methods Explanation;Handout   Comprehension Verbalized understanding             PT Long Term Goals - 01/13/15 1001    PT LONG TERM GOAL #1   Title I with advanced HEP   Time 4   Period Weeks   Status On-going   PT LONG TERM GOAL #2   Title increase Rt shoulder ROM to [facility]   Time 4   Period Weeks   Status On-going   PT LONG TERM GOAL #3   Title increase strength Rt UE =/> 5-/5   Time 4   Period Weeks   Status On-going   PT LONG TERM GOAL #4   Title improve FOTO =/< 34% limited   Time 4   Period Weeks   Status On-going   PT LONG TERM GOAL #5   Title perform her house hold activities per her baseline   Time 4   Period Weeks   Status On-going               Plan - 01/21/15 1132    Clinical Impression Statement Pt demonstrated improved Rt shoulder flex/ abduction, but decreased ER.  After manual therapy, ER improved to 30 degrees.  Pt tolerated new exercises with minimal increase in symptoms. Progressing towards goals.    Pt will benefit from skilled therapeutic intervention in order to improve on the following deficits Decreased range of motion;Impaired flexibility;Postural dysfunction;Impaired UE functional use;Decreased strength   Rehab Potential Excellent   PT Frequency 2x / week   PT Duration 4 weeks   PT Treatment/Interventions Electrical Stimulation;Functional mobility training;Cryotherapy;Ultrasound;Manual techniques;Passive range of motion;Therapeutic exercise;Moist Heat;Therapeutic activities;Patient/family education   PT Next Visit Plan Continue per plan of care with progressive ROM and strengthening for Rt shoulder.   Consulted and Agree with Plan of Care Patient        Problem List Patient Active Problem List   Diagnosis Date Noted  . Benign essential HTN 09/18/2014  . RBBB 09/18/2014  . Incomplete RBBB   .  DCM (dilated cardiomyopathy) 04/24/2014  . Meniere disease 06/02/2012    Kerin Perna, PTA 01/21/2015 11:34 AM  [place], [place],  [place] Phone: [phone]   Fax:  [phone]

## 2015-01-23 ENCOUNTER — Ambulatory Visit (INDEPENDENT_AMBULATORY_CARE_PROVIDER_SITE_OTHER): Admitting: Physical Therapy

## 2015-01-23 DIAGNOSIS — M2550 Pain in unspecified joint: Secondary | ICD-10-CM

## 2015-01-23 DIAGNOSIS — R29898 Other symptoms and signs involving the musculoskeletal system: Secondary | ICD-10-CM | POA: Diagnosis not present

## 2015-01-23 DIAGNOSIS — M25611 Stiffness of right shoulder, not elsewhere classified: Secondary | ICD-10-CM

## 2015-01-23 NOTE — Therapy (Signed)
[place], [place], [address] Phone: [phone]   Fax:  [phone]  Physical Therapy Treatment  Patient Details  Name: Meagan Ryan MRN: [id number] Date of Birth: [birth year] Referring Provider:  Leandrew Koyanagi, MD  Encounter Date: 01/23/2015      PT End of Session - 01/23/15 0935    Visit Number 5   Number of Visits 8   Date for PT Re-Evaluation 01/31/15   PT Start Time 0949   PT Stop Time 1022   PT Time Calculation (min) 33 min   Activity Tolerance Patient tolerated treatment well      Past Medical History  Diagnosis Date  . Pelvic pain in female   . Hypertension   . History of IBS   . History of hyperlipidemia   . H/O allergic rhinitis   . H/O osteopenia 2009  . HX: breast cancer   . Tubular adenoma of colon   . H/O fibromyalgia   . H/O actinic keratosis   . Dysuria 2011  . PMB (postmenopausal bleeding) 2011  . H/O peripheral neuropathy   . Diabetes mellitus   . Hx of colonic polyps   . Hx of migraines   . Asthma   . H/O varicose veins   . Hx of ovarian cyst   . Pregnancy induced hypertension   . PMB (postmenopausal bleeding) 2011  . Hyperlipidemia   . Breast cancer 1996    stage II infiltrating ductal carcinoma, 3/8 axillary lymph nodes were positive for metastatic carcinoma, getting yearly mammogram and every other year MRI followed by oncologist Dr Benay Spice  . Allergic rhinitis   . Right-sided lacunar infarction     MRI 1.2012  . Fatty liver CT 1/12    elevated LFT  . Incomplete RBBB   . DCM (dilated cardiomyopathy)     Past Surgical History  Procedure Laterality Date  . Gastroc muscle repair  2007  . Mastectomy  06/1995    Left - for infiltrating ductal carcinoma  . Tubal ligation      There were no vitals filed for this visit.  Visit Diagnosis:  Stiffness of shoulder joint, right  Pain, joint, multiple sites  Weakness of shoulder      Subjective  Assessment - 01/23/15 0936    Symptoms Pt reports Rt shoulder is less painful and she noticed she has improved ROM. Able to reach behind back farther today.    Currently in Pain? Yes   Pain Score 3    Pain Location Shoulder   Pain Orientation Right;Lateral   Pain Descriptors / Indicators Nagging   Aggravating Factors  Donning clothes, raising arm up    Pain Relieving Factors ice, heat            [facility] PT Assessment - 01/23/15 0001    Assessment   Medical Diagnosis Rt shoulder dislocation, greater tuberosity fx   Onset Date 11/27/14   Next MD Visit 02/07/15                   [facility] Adult PT Treatment/Exercise - 01/23/15 0001    Exercises   Exercises Shoulder   Shoulder Exercises: Standing   External Rotation Strengthening;Right;10 reps;Theraband  2 sets, Rockwood   Theraband Level (Shoulder External Rotation) Level 1 (Yellow)   Internal Rotation Strengthening;Right;10 reps;Theraband  2 sets, Rockwood    Theraband Level (Shoulder Internal Rotation) Level 1 (Yellow)   Flexion 10 reps;Strengthening;Theraband;Right  2 sets,. Rockwood  Theraband Level (Shoulder Flexion) Level 1 (Yellow)   Row Strengthening;Right;10 reps;Theraband  2 sets, Rockwood    Theraband Level (Shoulder Row) Level 1 (Yellow)   Other Standing Exercises Wall ladder flex x 4 reps, ABD (up to #22) x 4 reps; Pendulum afterward to loosen things up.    Shoulder Exercises: Pulleys   Flexion 3 minutes   ABduction 2 minutes   Other Pulley Exercises IR x 2 min    Modalities   Modalities Cryotherapy   Cryotherapy   Number Minutes Cryotherapy 10 Minutes   Cryotherapy Location Shoulder  Rt   Type of Cryotherapy --  vaso, med pressure, 3 snowflakes.    Manual Therapy   Manual Therapy Joint mobilization   Joint Mobilization grade 3 ap and inf mob to [facility], occilations into ER.                      PT Long Term Goals - 01/23/15 1024    PT LONG TERM GOAL #1   Title I with advanced HEP    Time 4   Period Weeks   Status On-going   PT LONG TERM GOAL #2   Title increase Rt shoulder ROM to [facility]   Time 4   Period Weeks   Status On-going   PT LONG TERM GOAL #3   Title increase strength Rt UE =/> 5-/5   Time 4   Period Weeks   Status On-going   PT LONG TERM GOAL #4   Title improve FOTO =/< 34% limited   Period Weeks   Status On-going   PT LONG TERM GOAL #5   Title perform her house hold activities per her baseline   Time 4   Period Weeks   Status On-going   PT LONG TERM GOAL #6   Title decrease pain in neck and Rt shoulder =/> 75% with movement   Time 4   Period Weeks   Status On-going               Plan - 01/23/15 1026    Clinical Impression Statement Pt able to tolerate new exercises with band without increase in symptoms. Pt reported decrease in Rt shoulder pain with joint mobilizations. Progressing towards goals.    Pt will benefit from skilled therapeutic intervention in order to improve on the following deficits Decreased range of motion;Impaired flexibility;Postural dysfunction;Impaired UE functional use;Decreased strength   Rehab Potential Excellent   PT Frequency 2x / week   PT Duration 4 weeks   PT Treatment/Interventions Electrical Stimulation;Functional mobility training;Cryotherapy;Ultrasound;Manual techniques;Passive range of motion;Therapeutic exercise;Moist Heat;Therapeutic activities;Patient/family education   PT Next Visit Plan Reassess ROM/strength/goals and write MD note next visit.    Consulted and Agree with Plan of Care Patient        Problem List Patient Active Problem List   Diagnosis Date Noted  . Benign essential HTN 09/18/2014  . RBBB 09/18/2014  . Incomplete RBBB   . DCM (dilated cardiomyopathy) 04/24/2014  . Meniere disease 06/02/2012   Kerin Perna, PTA 01/23/2015 10:32 AM  [place], [place], [address] Phone: [phone]    Fax:  [phone]

## 2015-01-23 NOTE — Patient Instructions (Signed)
  Low Row: Single Arm   Face anchor in stride stance. Palm up, pull arm back while squeezing shoulder blades together. Repeat 10__ times per set. Repeat with other arm. Do _2-3_ sets per session. Do 3__ sessions per week. Anchor Height: Waist  http://tub.exer.us/71   Copyright  VHI. All rights reserved.  Press: Thumb Up (Single Arm)   Face away from anchor in stride stance, leg forward opposite exercising arm. Press arm forward with thumb up. Repeat 10 times per set.  Do _2-3_ sets per session. Do _3_ sessions per week. Anchor Height: Chest  http://tub.exer.us/17   Copyright  VHI. All rights reserved.  Rotation: External (Single Arm)  HOLD TOWEL UNDER ELBOW Side toward anchor in shoulder width stance with elbow bent to 90, arm across mid-section. Thumb up, pull arm away from body, keeping elbow bent. Repeat _10_ times per set. Repeat with other arm. Do _2-3_ sets per session. Do _3_ sessions per week. Anchor Height: Waist  http://tub.exer.us/115   Copyright  VHI. All rights reserved.  Rotation: Internal (Single Arm)  HOLD TOWEL UNDER ELBOW  Side toward anchor in shoulder width stance with elbow bent to 90, forearm away from body. Thumb up, pull arm across body keeping elbow bent. Repeat _10_ times per set. Repeat with other arm. Do _2-3_ sets per session. Do _3_ sessions per week. Anchor Height: Waist  http://tub.exer.us/123   Copyright  VHI. All rights reserved.    [facility] [place], [place] [address]  [phone] (office) [phone] (fax)

## 2015-01-30 ENCOUNTER — Ambulatory Visit (INDEPENDENT_AMBULATORY_CARE_PROVIDER_SITE_OTHER): Admitting: Physical Therapy

## 2015-01-30 ENCOUNTER — Encounter: Payer: Self-pay | Admitting: Physical Therapy

## 2015-01-30 DIAGNOSIS — R29898 Other symptoms and signs involving the musculoskeletal system: Secondary | ICD-10-CM

## 2015-01-30 DIAGNOSIS — M25621 Stiffness of right elbow, not elsewhere classified: Secondary | ICD-10-CM | POA: Diagnosis not present

## 2015-01-30 DIAGNOSIS — M79621 Pain in right upper arm: Secondary | ICD-10-CM

## 2015-01-30 DIAGNOSIS — M25521 Pain in right elbow: Secondary | ICD-10-CM

## 2015-01-30 NOTE — Therapy (Signed)
[place], [place], [address] Phone: [phone]   Fax:  [phone]  Physical Therapy Treatment  Patient Details  Name: Meagan Ryan MRN: [id number] Date of Birth: [birth year] Referring Provider:  Leandrew Koyanagi, MD  Encounter Date: 01/30/2015      PT End of Session - 01/30/15 1141    Visit Number 6   Number of Visits 8   Date for PT Re-Evaluation 03/13/15   PT Start Time 1105   PT Stop Time 1155   PT Time Calculation (min) 50 min   Activity Tolerance Patient limited by pain      Past Medical History  Diagnosis Date  . Pelvic pain in female   . Hypertension   . History of IBS   . History of hyperlipidemia   . H/O allergic rhinitis   . H/O osteopenia 2009  . HX: breast cancer   . Tubular adenoma of colon   . H/O fibromyalgia   . H/O actinic keratosis   . Dysuria 2011  . PMB (postmenopausal bleeding) 2011  . H/O peripheral neuropathy   . Diabetes mellitus   . Hx of colonic polyps   . Hx of migraines   . Asthma   . H/O varicose veins   . Hx of ovarian cyst   . Pregnancy induced hypertension   . PMB (postmenopausal bleeding) 2011  . Hyperlipidemia   . Breast cancer 1996    stage II infiltrating ductal carcinoma, 3/8 axillary lymph nodes were positive for metastatic carcinoma, getting yearly mammogram and every other year MRI followed by oncologist Dr Benay Spice  . Allergic rhinitis   . Right-sided lacunar infarction     MRI 1.2012  . Fatty liver CT 1/12    elevated LFT  . Incomplete RBBB   . DCM (dilated cardiomyopathy)     Past Surgical History  Procedure Laterality Date  . Gastroc muscle repair  2007  . Mastectomy  06/1995    Left - for infiltrating ductal carcinoma  . Tubal ligation      There were no vitals filed for this visit.  Visit Diagnosis:  Weakness of right arm - Plan: PT plan of care cert/re-cert  Pain in joint, upper arm, right - Plan: PT plan of care  cert/re-cert  Stiffness of upper arm joint, right - Plan: PT plan of care cert/re-cert      Subjective Assessment - 01/30/15 1106    Symptoms Pt reports being very sore after the last treatment and then the shoulder hurt over the weekend.  It has calmed backdown now. Tolerating new HEP well with the bands (Rockwood 4's)   Patient Stated Goals still has difficulty with dressing upper body, lifting and reaching.    Currently in Pain? Yes   Pain Score 3    Pain Location Shoulder   Pain Orientation Right   Pain Descriptors / Indicators Aching;Sore   Pain Type Surgical pain   Pain Frequency Intermittent   Multiple Pain Sites Yes   Pain Score 1   Pain Location Neck   Pain Descriptors / Indicators Tightness   Pain Type Acute pain   Pain Frequency Intermittent            [facility] PT Assessment - 01/30/15 0001    Assessment   Medical Diagnosis Rt shoulder dislocation, greater tuberosity fx   Onset Date 11/27/14   Next MD Visit 02/07/15   Observation/Other Assessments   Focus on Therapeutic Outcomes (FOTO)  40% limited   AROM   AROM Assessment Site Shoulder   Right/Left Shoulder Right   Right Shoulder Flexion 137 Degrees   Right Shoulder ABduction 85 Degrees   Right Shoulder Internal Rotation --  WNL measured in supine, 3" difference with reaching behind   Right Shoulder External Rotation 20 Degrees   PROM   Right Shoulder External Rotation 28 Degrees   Strength   Strength Assessment Site Shoulder   Right/Left Shoulder Right   Right Shoulder Flexion 3/5   Right Shoulder ABduction 4-/5   Right Shoulder Internal Rotation 5/5   Right Shoulder External Rotation 4-/5                   [facility] Adult PT Treatment/Exercise - 01/30/15 0001    Shoulder Exercises: Supine   Other Supine Exercises 10 repsx 5sec, head presses, shoulder presses, thoracic lifts   Shoulder Exercises: Standing   Flexion Right;10 reps  shoulder ladder   ABduction 10 reps  scaption on shoulder  ladder   Shoulder Exercises: ROM/Strengthening   UBE (Upper Arm Bike) L1x 2'/2'   Shoulder Exercises: Isometric Strengthening   External Rotation --  10x5sec   Internal Rotation --  10x5'   Modalities   Modalities Moist Heat;Ultrasound   Moist Heat Therapy   Number Minutes Moist Heat 15 Minutes   Moist Heat Location Shoulder  Rt and deltoid   Ultrasound   Ultrasound Location Rt deltoid and anterior bicep   Ultrasound Parameters 100%, 3.3-1.17mz, 1/3w/cm2   Ultrasound Goals Pain                     PT Long Term Goals - 01/30/15 1102    PT LONG TERM GOAL #1   Title I with advanced HEP   Status On-going   PT LONG TERM GOAL #2   [facility]  Status On-going   PT LONG TERM GOAL #3   Title increase strength Rt UE =/> 5-/5   Status Partially Met   PT LONG TERM GOAL #4   Title improve FOTO =/< 34% limited  scored 40%   Status On-going   PT LONG TERM GOAL #5   Title perform her house hold activities per her baseline   Status On-going   PT LONG TERM GOAL #6   Title decrease pain in neck and Rt shoulder =/> 75% with movement               Plan - 01/30/15 1144    Clinical Impression Statement Pt continues to have ROM and strength limitations in the Rt shoulder.  She is improviing and progressing to goals.  Would benefit from continued treatment   Pt will benefit from skilled therapeutic intervention in order to improve on the following deficits Decreased range of motion;Impaired flexibility;Postural dysfunction;Impaired UE functional use;Decreased strength   Rehab Potential Excellent   PT Frequency 1x / week   PT Duration 6 weeks   PT Treatment/Interventions Electrical Stimulation;Functional mobility training;Cryotherapy;Ultrasound;Manual techniques;Passive range of motion;Therapeutic exercise;Moist Heat;Therapeutic activities;Patient/family education   Consulted and Agree with Plan of Care Patient        Problem List Patient  Active Problem List   Diagnosis Date Noted  . Benign essential HTN 09/18/2014  . RBBB 09/18/2014  . Incomplete RBBB   . DCM (dilated cardiomyopathy) 04/24/2014  . Meniere disease 06/02/2012    SJeral Pinch PT 01/30/2015, 11:49 AM  [id number] [address]  [place], [place], [address] Phone: [phone]   Fax:  [phone]

## 2015-02-06 ENCOUNTER — Encounter: Admitting: Physical Therapy

## 2015-02-12 ENCOUNTER — Ambulatory Visit (INDEPENDENT_AMBULATORY_CARE_PROVIDER_SITE_OTHER): Admitting: Physical Therapy

## 2015-02-12 DIAGNOSIS — M25611 Stiffness of right shoulder, not elsewhere classified: Secondary | ICD-10-CM | POA: Diagnosis not present

## 2015-02-12 DIAGNOSIS — M25621 Stiffness of right elbow, not elsewhere classified: Secondary | ICD-10-CM

## 2015-02-12 DIAGNOSIS — M79621 Pain in right upper arm: Secondary | ICD-10-CM

## 2015-02-12 DIAGNOSIS — M2550 Pain in unspecified joint: Secondary | ICD-10-CM

## 2015-02-12 DIAGNOSIS — R29898 Other symptoms and signs involving the musculoskeletal system: Secondary | ICD-10-CM

## 2015-02-12 DIAGNOSIS — M25521 Pain in right elbow: Secondary | ICD-10-CM

## 2015-02-12 NOTE — Therapy (Signed)
[place], [place], [address] Phone: [phone]   Fax:  [phone]  Physical Therapy Treatment  Patient Details  Name: Meagan Ryan MRN: [id number] Date of Birth: 1956/04/29 Referring Provider:  Leandrew Koyanagi, MD  Encounter Date: 02/12/2015      PT End of Session - 02/12/15 0940    Visit Number 7   Number of Visits 14   Date for PT Re-Evaluation 03/13/15   PT Start Time 0933   PT Stop Time 1027   PT Time Calculation (min) 54 min   Activity Tolerance Patient tolerated treatment well      Past Medical History  Diagnosis Date  . Pelvic pain in female   . Hypertension   . History of IBS   . History of hyperlipidemia   . H/O allergic rhinitis   . H/O osteopenia 2009  . HX: breast cancer   . Tubular adenoma of colon   . H/O fibromyalgia   . H/O actinic keratosis   . Dysuria 2011  . PMB (postmenopausal bleeding) 2011  . H/O peripheral neuropathy   . Diabetes mellitus   . Hx of colonic polyps   . Hx of migraines   . Asthma   . H/O varicose veins   . Hx of ovarian cyst   . Pregnancy induced hypertension   . PMB (postmenopausal bleeding) 2011  . Hyperlipidemia   . Breast cancer 1996    stage II infiltrating ductal carcinoma, 3/8 axillary lymph nodes were positive for metastatic carcinoma, getting yearly mammogram and every other year MRI followed by oncologist Dr Benay Spice  . Allergic rhinitis   . Right-sided lacunar infarction     MRI 1.2012  . Fatty liver CT 1/12    elevated LFT  . Incomplete RBBB   . DCM (dilated cardiomyopathy)     Past Surgical History  Procedure Laterality Date  . Gastroc muscle repair  2007  . Mastectomy  06/1995    Left - for infiltrating ductal carcinoma  . Tubal ligation      There were no vitals filed for this visit.  Visit Diagnosis:  Weakness of right arm  Pain in joint, upper arm, right  Stiffness of shoulder joint, right  Stiffness of  upper arm joint, right  Pain, joint, multiple sites  Weakness of shoulder      Subjective Assessment - 02/12/15 0934    Subjective Pt reports discomfort in Rt forearm lately.  With weather change, more pain in Rt shoulder.  MD sent script " Can now be more aggressive with ROM, fx is healed"     Currently in Pain? Yes   Pain Score 5    Pain Location Shoulder   Pain Orientation Right   Pain Descriptors / Indicators Aching   Aggravating Factors  reaching up or behind back    Pain Relieving Factors heat             [facility] PT Assessment - 02/12/15 0001    Assessment   Medical Diagnosis Rt shoulder dislocation, greater tuberosity fx   Onset Date 11/27/14   AROM   AROM Assessment Site Shoulder   Right/Left Shoulder Right   Right Shoulder Flexion 140 Degrees  128 standing    Right Shoulder ABduction 100 Degrees  105 standing    Right Shoulder Internal Rotation --  WNL measured in supine, 3" difference with reaching behind   Right Shoulder External Rotation 25 Degrees  30 deg after jt  mob                   [facility] Adult PT Treatment/Exercise - 02/12/15 0001    Shoulder Exercises: Standing   External Rotation Right;Strengthening;10 reps;Theraband   Theraband Level (Shoulder External Rotation) Level 2 (Red)   Internal Rotation Right;Strengthening;10 reps;Theraband   Theraband Level (Shoulder Internal Rotation) Level 2 (Red)   Flexion Strengthening;Right;10 reps;Theraband   Theraband Level (Shoulder Flexion) Level 2 (Red)   Row Strengthening;Right;10 reps;Theraband   Theraband Level (Shoulder Row) Level 2 (Red)   Shoulder Exercises: Pulleys   Flexion 2 minutes   ABduction 2 minutes   Other Pulley Exercises IR x 1 min    Shoulder Exercises: ROM/Strengthening   UBE (Upper Arm Bike) L2 x 2'/2'   Modalities   Modalities Moist Heat;Ultrasound   Moist Heat Therapy   Number Minutes Moist Heat 10 Minutes   Moist Heat Location Shoulder  Rt    Ultrasound   Ultrasound  Location Rt deltoid (mid and post)   Ultrasound Parameters 100%, 3.3 mHz, 1.0 w/cm2   Ultrasound Goals Pain   Manual Therapy   Manual Therapy Joint mobilization   Joint Mobilization Grade 3 AP and inf mobs to Rt GH, occilations into ER.                 PT Education - 02/12/15 1111    Education provided Yes   Education Details Increased resistance to Red band for current HEP. Encouraged pt to perform shoulder stretches every day.    Person(s) Educated Patient   Methods Explanation;Demonstration   Comprehension Verbalized understanding;Returned demonstration             PT Long Term Goals - 01/30/15 1102    PT LONG TERM GOAL #1   Title I with advanced HEP   Status On-going   PT LONG TERM GOAL #2   Title increase Rt shoulder ROM to [facility]   Status On-going   PT LONG TERM GOAL #3   Title increase strength Rt UE =/> 5-/5   Status Partially Met   PT LONG TERM GOAL #4   Title improve FOTO =/< 34% limited  scored 40%   Status On-going   PT LONG TERM GOAL #5   Title perform her house hold activities per her baseline   Status On-going   PT LONG TERM GOAL #6   Title decrease pain in neck and Rt shoulder =/> 75% with movement               Plan - 02/12/15 1116    Clinical Impression Statement Pt reported decreased pain in Rt shoulder after treatment, to 2/10. Noted increased Rt shoulder ER ROM and decreased pain after jt mobilization. Pt tolerated increased resistance with [place] 4 without increased symptoms.    Pt will benefit from skilled therapeutic intervention in order to improve on the following deficits Decreased range of motion;Impaired flexibility;Postural dysfunction;Impaired UE functional use;Decreased strength   Rehab Potential Excellent   PT Frequency 1x / week   PT Duration 6 weeks   PT Treatment/Interventions Electrical Stimulation;Functional mobility training;Cryotherapy;Ultrasound;Manual techniques;Passive range of motion;Therapeutic  exercise;Moist Heat;Therapeutic activities;Patient/family education   PT Next Visit Plan Continue progressive strenghtening/ROM for Rt shoulder.    Consulted and Agree with Plan of Care Patient        Problem List Patient Active Problem List   Diagnosis Date Noted  . Benign essential HTN 09/18/2014  . RBBB 09/18/2014  . Incomplete RBBB   . DCM (  dilated cardiomyopathy) 04/24/2014  . Meniere disease 06/02/2012   Kerin Perna, PTA 02/12/2015 11:31 AM   [place], [place], [address] Phone: [phone]   Fax:  [phone]

## 2015-02-19 ENCOUNTER — Ambulatory Visit (INDEPENDENT_AMBULATORY_CARE_PROVIDER_SITE_OTHER): Admitting: Physical Therapy

## 2015-02-19 DIAGNOSIS — R29898 Other symptoms and signs involving the musculoskeletal system: Secondary | ICD-10-CM | POA: Diagnosis not present

## 2015-02-19 DIAGNOSIS — M25611 Stiffness of right shoulder, not elsewhere classified: Secondary | ICD-10-CM

## 2015-02-19 DIAGNOSIS — R293 Abnormal posture: Secondary | ICD-10-CM

## 2015-02-19 DIAGNOSIS — M79621 Pain in right upper arm: Secondary | ICD-10-CM

## 2015-02-19 DIAGNOSIS — M25521 Pain in right elbow: Secondary | ICD-10-CM

## 2015-02-19 NOTE — Therapy (Signed)
[place], [place], [address] Phone: [phone]   Fax:  [phone]  Physical Therapy Treatment  Patient Details  Name: Meagan Ryan MRN: [id number] Date of Birth: 08-06-1956 Referring Provider:  Leandrew Koyanagi, MD  Encounter Date: 02/19/2015      PT End of Session - 02/19/15 0932    Visit Number 8   Number of Visits 14   Date for PT Re-Evaluation 03/13/15   PT Start Time 0933   PT Stop Time 1040   PT Time Calculation (min) 67 min   Activity Tolerance Patient limited by pain      Past Medical History  Diagnosis Date  . Pelvic pain in female   . Hypertension   . History of IBS   . History of hyperlipidemia   . H/O allergic rhinitis   . H/O osteopenia 2009  . HX: breast cancer   . Tubular adenoma of colon   . H/O fibromyalgia   . H/O actinic keratosis   . Dysuria 2011  . PMB (postmenopausal bleeding) 2011  . H/O peripheral neuropathy   . Diabetes mellitus   . Hx of colonic polyps   . Hx of migraines   . Asthma   . H/O varicose veins   . Hx of ovarian cyst   . Pregnancy induced hypertension   . PMB (postmenopausal bleeding) 2011  . Hyperlipidemia   . Breast cancer 1996    stage II infiltrating ductal carcinoma, 3/8 axillary lymph nodes were positive for metastatic carcinoma, getting yearly mammogram and every other year MRI followed by oncologist Dr Benay Spice  . Allergic rhinitis   . Right-sided lacunar infarction     MRI 1.2012  . Fatty liver CT 1/12    elevated LFT  . Incomplete RBBB   . DCM (dilated cardiomyopathy)     Past Surgical History  Procedure Laterality Date  . Gastroc muscle repair  2007  . Mastectomy  06/1995    Left - for infiltrating ductal carcinoma  . Tubal ligation      There were no vitals filed for this visit.  Visit Diagnosis:  Weakness of right arm  Pain in joint, upper arm, right  Stiffness of shoulder joint, right  Abnormal posture       Subjective Assessment - 02/19/15 0933    Subjective Pt reported she is now doing HEP first thing every morning. She noticed popping in Rt ant shoulder with ER stick stretches.    Currently in Pain? Yes   Pain Score 3    Pain Location Shoulder   Pain Orientation Right   Pain Descriptors / Indicators Nagging;Dull   Aggravating Factors  reaching up or behind back   Pain Relieving Factors heat             [facility] PT Assessment - 02/19/15 0001    AROM   AROM Assessment Site Shoulder   Right/Left Shoulder Right   Right Shoulder Flexion --  supine, 128 standing   Right Shoulder ABduction --  105 standing    Right Shoulder Internal Rotation --  3" difference with reaching behind   Right Shoulder External Rotation 20 Degrees   PROM   Right Shoulder External Rotation 32 Degrees  after jt mobilization                    [facility] Adult PT Treatment/Exercise - 02/19/15 0001    Shoulder Exercises: Supine   Protraction Strengthening;Right;10 reps;Weights  2 sets   Protraction Weight (lbs) 2#   Flexion Strengthening;Right;10 reps;Weights  1 sets #1, 1 set 2#   Shoulder Flexion Weight (lbs) 1#,2#   Shoulder Exercises: Sidelying   External Rotation Strengthening;Right;10 reps;Weights  2 sets   External Rotation Weight (lbs) 2#   ABduction 10 reps;Strengthening;Right  empty can, small range, 2 sets   ABduction Weight (lbs) 2#    Shoulder Exercises: Pulleys   Flexion 3 minutes   ABduction 3 minutes   Other Pulley Exercises IR x 3 min    Shoulder Exercises: ROM/Strengthening   UBE (Upper Arm Bike) L3 x 3'/2'  Standing    Shoulder Exercises: Stretch   Other Shoulder Stretches seated shoulder flexion with arms on ball x 30 sec x 3; with lateral trunkl flex to L x 30 sec x 3 reps    Modalities   Modalities Moist Heat;Ultrasound   Moist Heat Therapy   Number Minutes Moist Heat 10 Minutes   Moist Heat Location Shoulder  Rt   Ultrasound   Ultrasound Location Rt deltoid and  ant bicep    Ultrasound Parameters 100% 3.3 Hz, 1.3 w/cm2 x 8 min    Ultrasound Goals Pain   Manual Therapy   Manual Therapy Joint mobilization;Passive ROM;Other (comment)   Joint Mobilization Grade 3 PA and inf mobs to Rt GH, occilations and contract/relax into ER.    Passive ROM Rt shoulder flex, ER    Other Manual Therapy TPR to Rt subscap, lat. Pt very point tender in lat/ teres maj - difficulty tolerating pressure.                 PT Education - 02/19/15 1048    Education provided Yes   Education Details Self care: heat prior to stretches and as needed to decrease pain.    Person(s) Educated Patient   Methods Explanation;Demonstration   Comprehension Verbalized understanding             PT Long Term Goals - 01/30/15 1102    PT LONG TERM GOAL #1   Title I with advanced HEP   Status On-going   PT LONG TERM GOAL #2   Title increase Rt shoulder ROM to [facility]   Status On-going   PT LONG TERM GOAL #3   Title increase strength Rt UE =/> 5-/5   Status Partially Met   PT LONG TERM GOAL #4   Title improve FOTO =/< 34% limited  scored 40%   Status On-going   PT LONG TERM GOAL #5   Title perform her house hold activities per her baseline   Status On-going   PT LONG TERM GOAL #6   Title decrease pain in neck and Rt shoulder =/> 75% with movement               Plan - 02/19/15 1042    Clinical Impression Statement Pt demo limited change in ROM and strength this visit. Pt cont with limited ER and ABD in Rt shoulder; point tender in IR muscles of subscap and lat with manual. Pt tolerated new exercises with minor increase in pain; reduced with use of [place] and MHP.    Pt will benefit from skilled therapeutic intervention in order to improve on the following deficits Decreased range of motion;Impaired flexibility;Postural dysfunction;Impaired UE functional use;Decreased strength   Rehab Potential Excellent   PT Frequency 1x / week   PT Duration 6 weeks   PT  Treatment/Interventions Electrical Stimulation;Functional mobility training;Cryotherapy;Ultrasound;Manual techniques;Passive range of motion;Therapeutic exercise;Moist  Heat;Therapeutic activities;Patient/family education   PT Next Visit Plan Continue progressive strenghtening/ROM for Rt shoulder. Manual therapy to IR of Rt rotator cuff.    Consulted and Agree with Plan of Care Patient        Problem List Patient Active Problem List   Diagnosis Date Noted  . Benign essential HTN 09/18/2014  . RBBB 09/18/2014  . Incomplete RBBB   . DCM (dilated cardiomyopathy) 04/24/2014  . Meniere disease 06/02/2012    Kerin Perna, PTA 02/19/2015 10:55 AM  [facility] [place], [place], [address] Phone: [phone]   Fax:  [phone]

## 2015-02-26 ENCOUNTER — Encounter: Admitting: Physical Therapy

## 2015-03-06 ENCOUNTER — Encounter: Admitting: Physical Therapy

## 2015-03-17 ENCOUNTER — Ambulatory Visit (INDEPENDENT_AMBULATORY_CARE_PROVIDER_SITE_OTHER): Admitting: Physical Therapy

## 2015-03-17 DIAGNOSIS — R293 Abnormal posture: Secondary | ICD-10-CM | POA: Diagnosis not present

## 2015-03-17 DIAGNOSIS — M25621 Stiffness of right elbow, not elsewhere classified: Secondary | ICD-10-CM

## 2015-03-17 DIAGNOSIS — R29898 Other symptoms and signs involving the musculoskeletal system: Secondary | ICD-10-CM

## 2015-03-17 DIAGNOSIS — M79621 Pain in right upper arm: Secondary | ICD-10-CM

## 2015-03-17 DIAGNOSIS — M25611 Stiffness of right shoulder, not elsewhere classified: Secondary | ICD-10-CM | POA: Diagnosis not present

## 2015-03-17 DIAGNOSIS — M25521 Pain in right elbow: Secondary | ICD-10-CM

## 2015-03-17 NOTE — Therapy (Addendum)
[place], [place], [address] Phone: [phone]   Fax:  [phone]  Physical Therapy Treatment  Patient Details  Name: Meagan Ryan MRN: [id number] Date of Birth: 01-03-1956 Referring Provider:  Hulan Fess, MD  Encounter Date: 03/17/2015      PT End of Session - 03/17/15 1240    Visit Number 9   Number of Visits 14   PT Start Time 1150   PT Stop Time 9892   PT Time Calculation (min) 63 min   Activity Tolerance Patient limited by pain      Past Medical History  Diagnosis Date  . Pelvic pain in female   . Hypertension   . History of IBS   . History of hyperlipidemia   . H/O allergic rhinitis   . H/O osteopenia 2009  . HX: breast cancer   . Tubular adenoma of colon   . H/O fibromyalgia   . H/O actinic keratosis   . Dysuria 2011  . PMB (postmenopausal bleeding) 2011  . H/O peripheral neuropathy   . Diabetes mellitus   . Hx of colonic polyps   . Hx of migraines   . Asthma   . H/O varicose veins   . Hx of ovarian cyst   . Pregnancy induced hypertension   . PMB (postmenopausal bleeding) 2011  . Hyperlipidemia   . Breast cancer 1996    stage II infiltrating ductal carcinoma, 3/8 axillary lymph nodes were positive for metastatic carcinoma, getting yearly mammogram and every other year MRI followed by oncologist Dr Benay Spice  . Allergic rhinitis   . Right-sided lacunar infarction     MRI 1.2012  . Fatty liver CT 1/12    elevated LFT  . Incomplete RBBB   . DCM (dilated cardiomyopathy)     Past Surgical History  Procedure Laterality Date  . Gastroc muscle repair  2007  . Mastectomy  06/1995    Left - for infiltrating ductal carcinoma  . Tubal ligation      There were no vitals filed for this visit.  Visit Diagnosis:  Weakness of right arm  Pain in joint, upper arm, right  Stiffness of shoulder joint, right  Abnormal posture  Stiffness of upper arm joint, right       Subjective Assessment - 03/17/15 1154    Subjective Pt reports she visited MD; wants to continue therapy before considering manipulation. She is having difficulty laying on Rt side again due to pain. Hasn't been doing HEP due to caring for herself and family with stomach bug over last few weeks.    Currently in Pain? Yes   Pain Score 5    Pain Location Shoulder   Pain Orientation Right   Pain Descriptors / Indicators Constant;Dull  "annoying"    Aggravating Factors  reaching up or behing back    Pain Relieving Factors heat/ice             [facility] PT Assessment - 03/17/15 0001    AROM   AROM Assessment Site Shoulder   Right/Left Shoulder Right   Right Shoulder Flexion 110 Degrees  seated, 130 deg in supine    Right Shoulder ABduction 95 Degrees  seated    Right Shoulder Internal Rotation --   Right Shoulder External Rotation 32 Degrees   Strength   Strength Assessment Site Shoulder   Right/Left Shoulder Right   Right Shoulder Flexion 3+/5  in available range, painful   Right Shoulder ABduction 3+/5  painful    Right Shoulder Internal Rotation 5/5   Right Shoulder External Rotation 4/5                     [facility] Adult PT Treatment/Exercise - 03/17/15 0001    Shoulder Exercises: Supine   Other Supine Exercises AAROM with cane in flexion and ER for Rt shoulder x 5 reps each    Shoulder Exercises: Standing   External Rotation Right;10 reps   Theraband Level (Shoulder External Rotation) Level 2 (Red)   Internal Rotation Strengthening;Right;10 reps   Theraband Level (Shoulder Internal Rotation) Level 2 (Red)   Flexion Strengthening;Right;10 reps  2 sets; 1st set with 2#, 2nd with 1#   Shoulder Flexion Weight (lbs) 2,1   ABduction Strengthening;Right;10 reps  2 sets    Shoulder ABduction Weight (lbs) 1   ABduction Limitations trialed yellow band, too difficult.  Mirror for visual feedback    Row Strengthening;Right;10 reps   Theraband Level (Shoulder Row)  Level 2 (Red)   Other Standing Exercises Forward punch with red band with RUE x 10 reps    Shoulder Exercises: Pulleys   Flexion 3 minutes   ABduction 3 minutes   Other Pulley Exercises IR x 2 min    Shoulder Exercises: ROM/Strengthening   UBE (Upper Arm Bike) L2: 2 min each direction    Modalities   Modalities Cryotherapy;Electrical Stimulation   Cryotherapy   Number Minutes Cryotherapy 15 Minutes   Cryotherapy Location Shoulder   Type of Cryotherapy --  vaso, med pressure 3*   Electrical Stimulation   Electrical Stimulation Location Rt shoulder    Electrical Stimulation Action IFC   Electrical Stimulation Parameters to tolerance; x 15 min    Electrical Stimulation Goals Pain                     PT Long Term Goals - 01/30/15 1102    PT LONG TERM GOAL #1   Title I with advanced HEP   Status On-going   PT LONG TERM GOAL #2   Title increase Rt shoulder ROM to [facility]   Status On-going   PT LONG TERM GOAL #3   Title increase strength Rt UE =/> 5-/5   Status Partially Met   PT LONG TERM GOAL #4   Title improve FOTO =/< 34% limited  scored 40%   Status On-going   PT LONG TERM GOAL #5   Title perform her house hold activities per her baseline   Status On-going   PT LONG TERM GOAL #6   Title decrease pain in neck and Rt shoulder =/> 75% with movement               Plan - 03/17/15 1248    Clinical Impression Statement Pt demonstrated decreased ROM and strength since last visit. Pain was a limiting barrier with ther ex. Pt is interested in continuation of therapy to increase functional independence with RUE.     Pt will benefit from skilled therapeutic intervention in order to improve on the following deficits Decreased range of motion;Impaired flexibility;Postural dysfunction;Impaired UE functional use;Decreased strength   Rehab Potential Excellent   PT Treatment/Interventions Electrical Stimulation;Functional mobility training;Cryotherapy;Ultrasound;Manual  techniques;Passive range of motion;Therapeutic exercise;Moist Heat;Therapeutic activities;Patient/family education   PT Next Visit Plan Spoke to supervising PT regarding pt's progress and her desire to continue therapy.     Consulted and Agree with Plan of Care Patient        Problem List Patient Active  Problem List   Diagnosis Date Noted  . Benign essential HTN 09/18/2014  . RBBB 09/18/2014  . Incomplete RBBB   . DCM (dilated cardiomyopathy) 04/24/2014  . Meniere disease 06/02/2012    Kerin Perna, PTA 03/17/2015 12:52 PM  [place], [place], [address] Phone: [phone]   Fax:  [phone]     Recertification sent to MD. Madelyn Flavors, PT 03/17/2015 1:41 PM  [facility] [place], [place] [address]  [phone] (office) [phone] (fax)

## 2015-03-17 NOTE — Addendum Note (Signed)
Addended by: Jaye Beagle on: 03/17/2015 01:41 PM   Modules accepted: Orders

## 2015-03-17 NOTE — Addendum Note (Signed)
Addended by: Jaye Beagle on: 03/17/2015 01:25 PM   Modules accepted: Orders

## 2015-03-20 ENCOUNTER — Ambulatory Visit (INDEPENDENT_AMBULATORY_CARE_PROVIDER_SITE_OTHER): Admitting: Physical Therapy

## 2015-03-20 DIAGNOSIS — M25611 Stiffness of right shoulder, not elsewhere classified: Secondary | ICD-10-CM | POA: Diagnosis not present

## 2015-03-20 DIAGNOSIS — M79621 Pain in right upper arm: Secondary | ICD-10-CM | POA: Diagnosis not present

## 2015-03-20 DIAGNOSIS — M25521 Pain in right elbow: Secondary | ICD-10-CM

## 2015-03-20 DIAGNOSIS — R293 Abnormal posture: Secondary | ICD-10-CM | POA: Diagnosis not present

## 2015-03-20 DIAGNOSIS — R29898 Other symptoms and signs involving the musculoskeletal system: Secondary | ICD-10-CM

## 2015-03-20 NOTE — Therapy (Addendum)
[facility] [place], [place], [address] Phone: [phone]   Fax:  [phone]  Physical Therapy Treatment  Patient Details  Name: Meagan Ryan MRN: [id number] Date of Birth: Sep 24, 1956 Referring Provider:  Leandrew Koyanagi, MD  Encounter Date: 03/20/2015      PT End of Session - 03/20/15 1145    Visit Number 10   Number of Visits 17   Date for PT Re-Evaluation 04/14/15   PT Start Time 1150   PT Stop Time 1249   PT Time Calculation (min) 59 min      Past Medical History  Diagnosis Date  . Pelvic pain in female   . Hypertension   . History of IBS   . History of hyperlipidemia   . H/O allergic rhinitis   . H/O osteopenia 2009  . HX: breast cancer   . Tubular adenoma of colon   . H/O fibromyalgia   . H/O actinic keratosis   . Dysuria 2011  . PMB (postmenopausal bleeding) 2011  . H/O peripheral neuropathy   . Diabetes mellitus   . Hx of colonic polyps   . Hx of migraines   . Asthma   . H/O varicose veins   . Hx of ovarian cyst   . Pregnancy induced hypertension   . PMB (postmenopausal bleeding) 2011  . Hyperlipidemia   . Breast cancer 1996    stage II infiltrating ductal carcinoma, 3/8 axillary lymph nodes were positive for metastatic carcinoma, getting yearly mammogram and every other year MRI followed by oncologist Dr Benay Spice  . Allergic rhinitis   . Right-sided lacunar infarction     MRI 1.2012  . Fatty liver CT 1/12    elevated LFT  . Incomplete RBBB   . DCM (dilated cardiomyopathy)     Past Surgical History  Procedure Laterality Date  . Gastroc muscle repair  2007  . Mastectomy  06/1995    Left - for infiltrating ductal carcinoma  . Tubal ligation      There were no vitals filed for this visit.  Visit Diagnosis:  Weakness of right arm  Pain in joint, upper arm, right  Stiffness of shoulder joint, right  Abnormal posture      Subjective Assessment - 03/20/15 1155    Subjective Pt reports she's been caring for granddaughter (broke her elbow) so hasn't had time to complete HEP.    Currently in Pain? Yes   Pain Score 5    Pain Location Shoulder   Pain Orientation Right   Pain Descriptors / Indicators Dull   Aggravating Factors  reaching up or behind back.    Pain Relieving Factors heat/ice               [facility] Adult PT Treatment/Exercise - 03/20/15 0001    Shoulder Exercises: Sidelying   External Rotation Strengthening;Right;10 reps   External Rotation Weight (lbs) 2#   ABduction Right;20 reps   ABduction Weight (lbs) 2#   Shoulder Exercises: Standing   External Rotation Strengthening;Right   Theraband Level (Shoulder External Rotation) Level 2 (Red)   External Rotation Limitations difficult due to limited range   Internal Rotation Strengthening;Right;15 reps   Theraband Level (Shoulder Internal Rotation) Level 3 (Green)   Row Strengthening;Right;10 reps;Theraband  2 sets    Theraband Level (Shoulder Row) Level 2 (Red);Level 3 (Green)   Other Standing Exercises wall ladder 5 x  in flexion, 5x abduction (up to #22)   Other Standing Exercises [place]  4 forward punch with red band x 10, green band x 10    Shoulder Exercises: Pulleys   Flexion 3 minutes   ABduction 3 minutes   Other Pulley Exercises IR x 2 min, with towel.    Modalities   Modalities Moist Heat   Moist Heat Therapy   Number Minutes Moist Heat 10 Minutes   Moist Heat Location Shoulder   Cryotherapy   Number Minutes Cryotherapy 15 Minutes   Cryotherapy Location Shoulder   Type of Cryotherapy --  vaso, med, 3*   Electrical Stimulation   Electrical Stimulation Location Rt shoulder    Electrical Stimulation Action IFC   Electrical Stimulation Parameters to tolerance x 15 min    Electrical Stimulation Goals Pain                PT Education - 03/20/15 1241    Education provided Yes   Education Details Pt reminded to do heat prior to stretches.  Increased HEP to  red band.    Person(s) Educated Patient   Methods Explanation;Demonstration   Comprehension Verbalized understanding;Returned demonstration             PT Long Term Goals - 01/30/15 1102    PT LONG TERM GOAL #1   Title I with advanced HEP   Status On-going   PT LONG TERM GOAL #2   Title increase Rt shoulder ROM to [facility]   Status On-going   PT LONG TERM GOAL #3   Title increase strength Rt UE =/> 5-/5   Status Partially Met   PT LONG TERM GOAL #4   Title improve FOTO =/< 34% limited  scored 40%   Status On-going   PT LONG TERM GOAL #5   Title perform her house hold activities per her baseline   Status On-going   PT LONG TERM GOAL #6   Title decrease pain in neck and Rt shoulder =/> 75% with movement               Plan - 03/20/15 1243    Clinical Impression Statement Pt demonstrated improved Rt shoulder ROM after use of MHP. Rt shoulder ER and abduction still limited, guarded. Pt reported decreased pain to 2/10 after treatment.    Pt will benefit from skilled therapeutic intervention in order to improve on the following deficits Decreased range of motion;Impaired flexibility;Postural dysfunction;Impaired UE functional use;Decreased strength   Rehab Potential Excellent   PT Frequency 2x / week   PT Duration 4 weeks   PT Treatment/Interventions Electrical Stimulation;Functional mobility training;Cryotherapy;Ultrasound;Manual techniques;Passive range of motion;Therapeutic exercise;Moist Heat;Therapeutic activities;Patient/family education   PT Next Visit Plan Continue progressive ROM and strengthening of Rt shoulder.    Consulted and Agree with Plan of Care Patient        Problem List Patient Active Problem List   Diagnosis Date Noted  . Benign essential HTN 09/18/2014  . RBBB 09/18/2014  . Incomplete RBBB   . DCM (dilated cardiomyopathy) 04/24/2014  . Meniere disease 06/02/2012    Kerin Perna, PTA 03/20/2015 12:53 PM  [facility], [place], [address] Phone: [phone]   Fax:  [phone]     PHYSICAL THERAPY DISCHARGE SUMMARY  Visits from Start of Care: 10  Current functional level related to goals / functional outcomes: unknown   Remaining deficits: unknown   Education / Equipment: HEP Plan:  Patient goals were partially met. Patient is being discharged due to not returning since the last visit.  ?????    Jeral Pinch, PT 04/30/2015 9:18 AM

## 2015-03-25 ENCOUNTER — Encounter: Admitting: Physical Therapy

## 2015-03-28 ENCOUNTER — Encounter: Admitting: Physical Therapy

## 2015-06-18 ENCOUNTER — Encounter: Payer: Self-pay | Admitting: Cardiology

## 2015-08-08 ENCOUNTER — Encounter: Payer: Self-pay | Admitting: Gynecologic Oncology

## 2015-08-08 ENCOUNTER — Ambulatory Visit: Attending: Gynecologic Oncology | Admitting: Gynecologic Oncology

## 2015-08-08 ENCOUNTER — Other Ambulatory Visit (HOSPITAL_BASED_OUTPATIENT_CLINIC_OR_DEPARTMENT_OTHER)

## 2015-08-08 VITALS — BP 119/63 | HR 116 | Temp 98.0°F | Resp 18 | Ht 64.0 in | Wt 232.5 lb

## 2015-08-08 DIAGNOSIS — N8320 Unspecified ovarian cysts: Secondary | ICD-10-CM | POA: Diagnosis present

## 2015-08-08 DIAGNOSIS — Z853 Personal history of malignant neoplasm of breast: Secondary | ICD-10-CM | POA: Insufficient documentation

## 2015-08-08 DIAGNOSIS — N83201 Unspecified ovarian cyst, right side: Secondary | ICD-10-CM

## 2015-08-08 DIAGNOSIS — Z6841 Body Mass Index (BMI) 40.0 and over, adult: Secondary | ICD-10-CM | POA: Diagnosis not present

## 2015-08-08 NOTE — Progress Notes (Signed)
Consult Note: Gyn-Onc  Consult was requested by Dr. Crawford Givens for the evaluation of Meagan Ryan 59 y.o. female with two right ovarian cysts   CC:  Chief Complaint  Patient presents with  . right ovarian cyst    New Consult    Assessment/Plan:  Meagan Ryan  is a 59 y.o.  year old with 2, 2 cm right ovarian cysts in the setting of morbid obesity (BMI 40 kg being a squared), a history of dilated cardiomyopathy (ejection fraction 50%), and a personal history of breast cancer in 1996. She also has a history significant for multiple prior retroperitoneal and abdominal surgeries.  I discussed with Meagan Ryan that I have a low suspicion of malignancy given the appearance of her ovary on ultrasound scan. However we will draw a CA-125 tumor marker today which will help better characterize the likelihood of malignancy. She certainly is at somewhat of an increased risk for ovarian cancer given her personal history of premenopausal breast cancer. I recommend genetic testing for this patient as she is at risk for harboring a brachy mutation.  We carefully weighed out the pros and cons of options. One option would be for close surveillance with repeat imaging in 3 months time and repeat CA-125. I would only obtain this option if her genetic testing was negative for a brachytherapy mutation and her CA-125 was normal. This option would avoid surgical risks which are substantial in this patient with her history of cardiomyopathy, and multiple prior abdominal surgeries including a right nephrectomy. I discussed that surgery was another option and this would involve a robotic assisted bilateral salpingo-oophorectomy, with hysterectomy and staging reserved only if frozen section reveals  a malignancy in the right ovary. I discussed that this would be a definitive option, but carries with it the greatest risk.  After considering her options the patient is interested in definitive action with surgery. We  reviewed a comprehensive the do not all inclusive list of potential surgical risks including  bleeding, infection, damage to internal organs (such as bladder,ureters, bowels), blood clot, reoperation and rehospitalization. I discussed that Meagan Ryan these risks are increased probability given her morbid obesity, prior abdominal surgeries. I also discussed that if she experiences a left ureteral injury this will render her with complete renal failure as she no longer has a right kidney. She voiced verbal understanding of these risks but still desires to pursue surgery. I discussed that conversion to laparotomy may be necessary if she has adhesive disease that is not amenable to minimally invasive approach.  We will touch base with her cardiologist, Dr Fransico Him to see if she needs optimization or repeat cardiac testing prior to this procedure.  HPI: Meagan Ryan is a 59 year old G41 P1 who is seen in consultation at the request of Dr. Charlesetta Garibaldi for right ovarian cysts in the setting of morbid obesity, multiple prior abdominal surgeries, dilated cardiomyopathy, and a personal history of premenopausal breast cancer at age 59. The patient reported some mild right sided pelvic pain to her doctor in September 2016. She is also had approximate 6 weeks of abdominal bloating. This prompted a transvaginal ultrasound scan which performed on 08/05/2015. It revealed a grossly normal appearing uterus with a right ovary containing 2 cystic structures one measuring 2.3 x 1.5 x 1.9 cm and the other measuring 2.2 x 2.2 x 1.9 cm. There was no ascites identified.  Since that time her right lower quadrant pain has dissipated however her abdominal bloating is  persistent.  The patient has a personal history significant for the menopausal breast cancer at age 25 in 19. This was treated with mastectomy, TRAM flap reconstruction, and chemotherapy with Adriamycin. Following that treatment she has remained with no evidence of  disease. However she did develop dilated cardiomyopathy that was felt to be secondary to her chemotherapy exposure and her hypertension. Her last documented echocardiogram was in 2013 and revealed an ejection fraction of 45-50%. Her cardiologist is Dr. Golden Hurter who last saw the patient in November 2015 and felt that she was doing well from the standpoint of her cardiomyopathy.  Following treatment of her breast cancer she was noted to have an atrophic right kidney of unclear etiology. She then underwent an open right nephrectomy. Subsequently in the early years of the 2000 she developed: Lithiasis and underwent a laparoscopic cholecystectomy. She reports that it were no problems with adhesive disease encounter that the surgery and was able to be accomplished laparoscopically.   Current Meds:  Outpatient Encounter Prescriptions as of 08/08/2015  Medication Sig  . ALPRAZolam (XANAX) 0.5 MG tablet Take 0.5 mg by mouth at bedtime as needed.  Marland Kitchen aspirin 81 MG tablet Take 81 mg by mouth daily.  Marland Kitchen azelastine (ASTELIN) 0.1 % nasal spray   . cetirizine (ZYRTEC) 10 MG tablet Take 10 mg by mouth daily.  . Cholecalciferol (VITAMIN D-3 PO) Take by mouth 2 (two) times daily.   . DULoxetine (CYMBALTA) 30 MG capsule Take 30 mg by mouth daily.  . fish oil-omega-3 fatty acids 1000 MG capsule Take 2 g by mouth 2 (two) times daily.   . fluticasone (FLONASE) 50 MCG/ACT nasal spray   . hydrochlorothiazide (HYDRODIURIL) 25 MG tablet Take 25 mg by mouth daily.  . meclizine (ANTIVERT) 12.5 MG tablet Take 12.5 mg by mouth 3 (three) times daily as needed for dizziness.  . niacin (NIASPAN) 500 MG CR tablet Take 500 mg by mouth every morning.   . olmesartan (BENICAR) 40 MG tablet Take 40 mg by mouth daily.   Marland Kitchen omeprazole (PRILOSEC) 10 MG capsule Take 10 mg by mouth daily.  . ondansetron (ZOFRAN ODT) 8 MG disintegrating tablet Take 1 tablet (8 mg total) by mouth every 8 (eight) hours as needed for nausea or vomiting.  .  potassium chloride (K-DUR) 10 MEQ tablet Take 10 mEq by mouth daily.  . raloxifene (EVISTA) 60 MG tablet Take 60 mg by mouth daily.  . [DISCONTINUED] fluticasone (FLOVENT DISKUS) 50 MCG/BLIST diskus inhaler Inhale 2 puffs into the lungs daily.  . [DISCONTINUED] omeprazole (PRILOSEC) 20 MG capsule   . [DISCONTINUED] oxyCODONE-acetaminophen (PERCOCET/ROXICET) 5-325 MG per tablet Take 1 tablet by mouth every 4 (four) hours as needed for severe pain. (Patient not taking: Reported on 01/03/2015)   No facility-administered encounter medications on file as of 08/08/2015.    Allergy:  Allergies  Allergen Reactions  . Norvasc [Amlodipine Besylate] Shortness Of Breath    Breathing difficulties  . Cephalexin Other (See Comments)    High temperature  . Codeine Nausea And Vomiting  . Noroxin [Norfloxacin]   . Sulfa Antibiotics Nausea And Vomiting  . Penicillins Rash    Social Hx:   Social History   Social History  . Marital Status: Widowed    Spouse Name: N/A  . Number of Children: N/A  . Years of Education: N/A   Occupational History  . Not on file.   Social History Main Topics  . Smoking status: Never Smoker   . Smokeless tobacco: Not  on file  . Alcohol Use: No  . Drug Use: No  . Sexual Activity: Not on file   Other Topics Concern  . 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 Wears Ear plugs   Marital Status: Widowed   1 daughter    Past Surgical Hx:  Past Surgical History  Procedure Laterality Date  . Gastroc muscle repair  2007  . Mastectomy  06/1995    Left - for infiltrating ductal carcinoma  . Tubal ligation    . Nephrectomy Right late 1990s  . Laparoscopic cholecystectomy      Past Medical Hx:  Past Medical History  Diagnosis Date  . Pelvic pain in female   .  Hypertension   . History of IBS   . History of hyperlipidemia   . H/O allergic rhinitis   . H/O osteopenia 2009  . HX: breast cancer   . Tubular adenoma of colon   . H/O fibromyalgia   . H/O actinic keratosis   . Dysuria 2011  . PMB (postmenopausal bleeding) 2011  . H/O peripheral neuropathy   . Diabetes mellitus   . Hx of colonic polyps   . Hx of migraines   . Asthma   . H/O varicose veins   . Hx of ovarian cyst   . Pregnancy induced hypertension   . PMB (postmenopausal bleeding) 2011  . Hyperlipidemia   . Breast cancer 1996    stage II infiltrating ductal carcinoma, 3/8 axillary lymph nodes were positive for metastatic carcinoma, getting yearly mammogram and every other year MRI followed by oncologist Dr Benay Spice  . Allergic rhinitis   . Right-sided lacunar infarction     MRI 1.2012  . Fatty liver CT 1/12    elevated LFT  . Incomplete RBBB   . DCM (dilated cardiomyopathy)   . Meniere disease     deaf in left ear     Past Gynecological History:  SVD x1, normal pap in September 2019  No LMP recorded. Patient is postmenopausal.  Family Hx:  Family History  Problem Relation Age of Onset  . Heart disease Mother     MI  . Stroke Mother   . Diabetes Mother   . Hypertension Mother   . Cancer Father     Prostate  . Cancer Sister     Colon    Review of Systems:  Constitutional  Feels well,    ENT Normal appearing ears and nares bilaterally Skin/Breast  No rash, sores, jaundice, itching, dryness Cardiovascular  No chest pain, shortness of breath, or edema  Pulmonary  No cough or wheeze.  Gastro Intestinal  No nausea, vomitting, or diarrhoea. No bright red blood per rectum, no abdominal pain, change in bowel movement, or constipation.  Genito Urinary  No frequency, urgency, dysuria,  Musculo Skeletal  No myalgia, arthralgia, joint swelling or pain  Neurologic  No weakness, numbness, change in gait,  Psychology  No depression, anxiety, insomnia.   Vitals:   Blood pressure 119/63, pulse 116, temperature 98 F (36.7 C), temperature source Oral, resp. rate 18, height 5\' 4"  (1.626 m), weight 232 lb 8 oz (105.461 kg), SpO2 98 %.  Physical Exam: WD in NAD Neck  Supple NROM, without any enlargements.  Lymph Node Survey No cervical supraclavicular  or inguinal adenopathy Cardiovascular  Pulse normal rate, regularity and rhythm. S1 and S2 normal.  Lungs  Clear to auscultation bilateraly, without wheezes/crackles/rhonchi. Good air movement.  Skin  No rash/lesions/breakdown  Psychiatry  Alert and oriented to person, place, and time  Abdomen  Normoactive bowel sounds, abdomen soft, non-tender and obese without evidence of hernia. Incisions in lower abdomen (transverse) - TRAM flap, periumbilcally - from TRAM flap, right subcostal and flank - from nephrectomy, and laparoscopic incisions. Back No CVA tenderness Genito Urinary  Vulva/vagina: Normal external female genitalia.  No lesions. No discharge or bleeding.  Bladder/urethra:  No lesions or masses, well supported bladder  Vagina: normal  Cervix: Normal appearing, no lesions.  Uterus: Small, mobile, no parametrial involvement or nodularity.  Adnexa: no palpable masses. Rectal  Good tone, no masses no cul de sac nodularity.  Extremities  No bilateral cyanosis, clubbing or edema.   Donaciano Eva, MD  08/08/2015, 10:33 AM

## 2015-08-08 NOTE — Patient Instructions (Signed)
Preparing for your Surgery  Plan for surgery on September 02, 2015 with Dr. Everitt Amber.  Pre-operative Testing -You will receive a phone call from presurgical testing at [facility] to arrange for a pre-operative testing appointment before your surgery.  This appointment normally occurs one to two weeks before your scheduled surgery.   -Bring your insurance card, copy of an advanced directive if applicable, medication list  -At that visit, you will be asked to sign a consent for a possible blood transfusion in case a transfusion becomes necessary during surgery.  The need for a blood transfusion is rare but having consent is a necessary part of your care.     -You should not be taking blood thinners or aspirin at least ten days prior to surgery unless instructed by your surgeon.  Day Before Surgery at [place] will be asked to take in only clear liquids the day before surgery.  Examples of clear liquids include broths, jello, and clear juices.  Avoid carbonated beverages.  You will be advised to have nothing to eat or drink after midnight the evening before.    Your role in recovery Your role is to become active as soon as directed by your doctor, while still giving yourself time to heal.  Rest when you feel tired. You will be asked to do the following in order to speed your recovery:  - Cough and breathe deeply. This helps toclear and expand your lungs and can prevent pneumonia. You may be given a spirometer to practice deep breathing. A staff member will show you how to use the spirometer. - Do mild physical activity. Walking or moving your legs help your circulation and body functions return to normal. A staff member will help you when you try to walk and will provide you with simple exercises. Do not try to get up or walk alone the first time. - Actively manage your pain. Managing your pain lets you move in comfort. We will ask you to rate your pain on a scale of zero  to 10. It is your responsibility to tell your doctor or nurse where and how much you hurt so your pain can be treated.  Special Considerations -If you are diabetic, you may be placed on insulin after surgery to have closer control over your blood sugars to promote healing and recovery.  This does not mean that you will be discharged on insulin.  If applicable, your oral antidiabetics will be resumed when you are tolerating a solid diet.  -Your final pathology results from surgery should be available by the Friday after surgery and the results will be relayed to you when available.  Blood Transfusion Information WHAT IS A BLOOD TRANSFUSION? A transfusion is the replacement of blood or some of its parts. Blood is made up of multiple cells which provide different functions.  Red blood cells carry oxygen and are used for blood loss replacement.  White blood cells fight against infection.  Platelets control bleeding.  Plasma helps clot blood.  Other blood products are available for specialized needs, such as hemophilia or other clotting disorders. BEFORE THE TRANSFUSION  Who gives blood for transfusions?   You may be able to donate blood to be used at a later date on yourself (autologous donation).  Relatives can be asked to donate blood. This is generally not any safer than if you have received blood from a stranger. The same precautions are taken to ensure safety when a relative's blood is donated.  Healthy volunteers who are fully evaluated to make sure their blood is safe. This is blood bank blood. Transfusion therapy is the safest it has ever been in the practice of medicine. Before blood is taken from a donor, a complete history is taken to make sure that person has no history of diseases nor engages in risky social behavior (examples are intravenous drug use or sexual activity with multiple partners). The donor's travel history is screened to minimize risk of transmitting infections,  such as malaria. The donated blood is tested for signs of infectious diseases, such as HIV and hepatitis. The blood is then tested to be sure it is compatible with you in order to minimize the chance of a transfusion reaction. If you or a relative donates blood, this is often done in anticipation of surgery and is not appropriate for emergency situations. It takes many days to process the donated blood. RISKS AND COMPLICATIONS Although transfusion therapy is very safe and saves many lives, the main dangers of transfusion include:   Getting an infectious disease.  Developing a transfusion reaction. This is an allergic reaction to something in the blood you were given. Every precaution is taken to prevent this. The decision to have a blood transfusion has been considered carefully by your caregiver before blood is given. Blood is not given unless the benefits outweigh the risks.

## 2015-08-09 LAB — CA 125: CA 125: 20 U/mL (ref <35)

## 2015-08-11 ENCOUNTER — Telehealth: Payer: Self-pay | Admitting: *Deleted

## 2015-08-11 ENCOUNTER — Telehealth: Payer: Self-pay

## 2015-08-11 ENCOUNTER — Other Ambulatory Visit: Payer: Self-pay | Admitting: Gynecologic Oncology

## 2015-08-11 DIAGNOSIS — I420 Dilated cardiomyopathy: Secondary | ICD-10-CM

## 2015-08-11 DIAGNOSIS — N83299 Other ovarian cyst, unspecified side: Secondary | ICD-10-CM

## 2015-08-11 DIAGNOSIS — Z01818 Encounter for other preprocedural examination: Secondary | ICD-10-CM

## 2015-08-11 DIAGNOSIS — Z853 Personal history of malignant neoplasm of breast: Secondary | ICD-10-CM

## 2015-08-11 NOTE — Telephone Encounter (Signed)
I will work her in to get an echo and to see me in clinic prior to her surgery. Katie please take care of getting her set up with OV with me and with echo         Traci    ----- Message -----     From: Everitt Amber, MD     Sent: 08/08/2015 10:47 AM      To: Sueanne Margarita, MD     Confirmed new appointment with Dr. Radford Pax on August 21, 2015 at 1530. ECHO ordered to be scheduled. Patient agrees with treatment plan.

## 2015-08-11 NOTE — Telephone Encounter (Signed)
Per Joylene John, NP patient notified of normal CA125 results. While on the phone, patient states she has an appointment with her cardiologist, Dr. Radford Pax, on 08/21/15. Requested that patient ask cardiology office to fax notes/cardiac clearance to our office after her appt.

## 2015-08-13 ENCOUNTER — Ambulatory Visit (HOSPITAL_COMMUNITY): Attending: Cardiology

## 2015-08-13 ENCOUNTER — Other Ambulatory Visit: Payer: Self-pay

## 2015-08-13 DIAGNOSIS — Z6839 Body mass index (BMI) 39.0-39.9, adult: Secondary | ICD-10-CM | POA: Diagnosis not present

## 2015-08-13 DIAGNOSIS — E785 Hyperlipidemia, unspecified: Secondary | ICD-10-CM | POA: Insufficient documentation

## 2015-08-13 DIAGNOSIS — Z01818 Encounter for other preprocedural examination: Secondary | ICD-10-CM | POA: Diagnosis not present

## 2015-08-13 DIAGNOSIS — I420 Dilated cardiomyopathy: Secondary | ICD-10-CM | POA: Insufficient documentation

## 2015-08-13 DIAGNOSIS — I10 Essential (primary) hypertension: Secondary | ICD-10-CM | POA: Insufficient documentation

## 2015-08-13 DIAGNOSIS — E119 Type 2 diabetes mellitus without complications: Secondary | ICD-10-CM | POA: Diagnosis not present

## 2015-08-13 NOTE — Progress Notes (Signed)
Please put orders in Epic surgery 09-02-15 pre op 08-28-15 Thanks

## 2015-08-14 ENCOUNTER — Telehealth: Payer: Self-pay

## 2015-08-14 DIAGNOSIS — R9430 Abnormal result of cardiovascular function study, unspecified: Secondary | ICD-10-CM

## 2015-08-14 NOTE — Telephone Encounter (Signed)
-----   Message from Sueanne Margarita, MD sent at 08/14/2015  9:37 AM EDT ----- Moderate to severely reduced LVF.  EF appears worse compared to echo 2013 - please set up a nuclear stress test to rule out ischemia

## 2015-08-14 NOTE — Telephone Encounter (Signed)
Informed patient of results and verbal understanding expressed.  Reviewed instructions for nuclear stress test and patient has no questions. Myoview ordered for scheduling. Patient agrees with treatment plan.

## 2015-08-20 ENCOUNTER — Telehealth (HOSPITAL_COMMUNITY): Payer: Self-pay | Admitting: *Deleted

## 2015-08-20 NOTE — Telephone Encounter (Signed)
Attempted to call patient regarding upcoming appointment- no answer x 2. Akaila Rambo J Analiz Tvedt, RN 

## 2015-08-21 ENCOUNTER — Ambulatory Visit (INDEPENDENT_AMBULATORY_CARE_PROVIDER_SITE_OTHER): Admitting: Cardiology

## 2015-08-21 ENCOUNTER — Encounter: Payer: Self-pay | Admitting: Cardiology

## 2015-08-21 ENCOUNTER — Telehealth (HOSPITAL_COMMUNITY): Payer: Self-pay

## 2015-08-21 VITALS — BP 110/80 | HR 98 | Ht 64.0 in | Wt 234.8 lb

## 2015-08-21 DIAGNOSIS — I4510 Unspecified right bundle-branch block: Secondary | ICD-10-CM

## 2015-08-21 DIAGNOSIS — I420 Dilated cardiomyopathy: Secondary | ICD-10-CM

## 2015-08-21 DIAGNOSIS — I10 Essential (primary) hypertension: Secondary | ICD-10-CM

## 2015-08-21 LAB — BASIC METABOLIC PANEL WITH GFR
BUN: 18 mg/dL (ref 7–25)
CO2: 26 mmol/L (ref 20–31)
Calcium: 9.5 mg/dL (ref 8.6–10.4)
Chloride: 100 mmol/L (ref 98–110)
Creat: 1.08 mg/dL — ABNORMAL HIGH (ref 0.50–0.99)
Glucose, Bld: 70 mg/dL (ref 65–99)
Potassium: 3.8 mmol/L (ref 3.5–5.3)
Sodium: 135 mmol/L (ref 135–146)

## 2015-08-21 MED ORDER — LOSARTAN POTASSIUM 50 MG PO TABS: 50.0000 mg | ORAL_TABLET | Freq: Every day | ORAL | Status: DC

## 2015-08-21 MED ORDER — CARVEDILOL 3.125 MG PO TABS: 3.1250 mg | ORAL_TABLET | Freq: Two times a day (BID) | ORAL | Status: DC

## 2015-08-21 NOTE — Progress Notes (Signed)
Cardiology Office Note   Date:  08/21/2015   ID:  Meagan Ryan, DOB [birth year], MRN [id number]  PCP:  Gennette Pac, MD    Chief Complaint  Patient presents with  . Cardiomyopathy  . 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 She is doing well. She denies any chest pain or palpitations. She has chronic LE edema from her peripheral neuropathy that is stable on diuretics. She says that recently she has had some increased SOB and attributes it to gaining 5lbs.  She also feels fatigued.  She denies any syncope but does have problems with vertigo.  She was found to have an ovarian cyst and is going to have BSO done once she is cleared from a cardiac standpoint.       Past Medical History  Diagnosis Date  . Pelvic pain in female   . Hypertension   . History of IBS   . History of hyperlipidemia   . H/O allergic rhinitis   . H/O osteopenia 2009  . HX: breast cancer   . Tubular adenoma of colon   . H/O fibromyalgia   . H/O actinic keratosis   . Dysuria 2011  . PMB (postmenopausal bleeding) 2011  . H/O peripheral neuropathy   . Diabetes mellitus   . Hx of colonic polyps   . Hx of migraines   . Asthma   . H/O varicose veins   . Hx of ovarian cyst   . Pregnancy induced hypertension   . PMB (postmenopausal bleeding) 2011  . Hyperlipidemia   . Breast cancer ([place]) 1996    stage II infiltrating ductal carcinoma, 3/8 axillary lymph nodes were positive for metastatic carcinoma, getting yearly mammogram and every other year MRI followed by oncologist Dr Benay Spice  . Allergic rhinitis   . Right-sided lacunar infarction ([place])     MRI 1.2012  . Fatty liver CT 1/12    elevated LFT  . Incomplete RBBB   . DCM (dilated cardiomyopathy) ([place])   . 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 Gastroc muscle repair  2007  . Mastectomy  06/1995    Left - for infiltrating ductal carcinoma  . Tubal ligation    . Nephrectomy Right late 1990s  . Laparoscopic cholecystectomy       Current Outpatient Prescriptions  Medication Sig Dispense Refill  . ALPRAZolam (XANAX) 0.5 MG tablet Take 0.5 mg by mouth at bedtime as needed.    Marland Kitchen aspirin 81 MG tablet Take 81 mg by mouth daily.    Marland Kitchen azelastine (ASTELIN) 0.1 % nasal spray Place 1 spray into both nostrils 2 (two) times daily.     . cetirizine (ZYRTEC) 10 MG tablet Take 10 mg by mouth daily.    . Cholecalciferol (VITAMIN D-3 PO) Take by mouth 2 (two) times daily.     . DULoxetine (CYMBALTA) 30 MG capsule Take 30 mg by mouth daily.    . fish oil-omega-3 fatty acids 1000 MG capsule Take 2 g by mouth 2 (two) times daily.     . fluticasone (FLONASE) 50 MCG/ACT nasal spray Place 1 spray into both nostrils daily.     . hydrochlorothiazide (  HYDRODIURIL) 25 MG tablet Take 25 mg by mouth daily.    . meclizine (ANTIVERT) 12.5 MG tablet Take 12.5 mg by mouth 3 (three) times daily as needed for dizziness.    . niacin (NIASPAN) 500 MG CR tablet Take 500 mg by mouth every morning.     . olmesartan (BENICAR) 40 MG tablet Take 40 mg by mouth daily.     Marland Kitchen omeprazole (PRILOSEC) 10 MG capsule Take 10 mg by mouth daily.    . potassium chloride (K-DUR) 10 MEQ tablet Take 10 mEq by mouth daily.    . raloxifene (EVISTA) 60 MG tablet Take 60 mg by mouth daily.     No current facility-administered medications for this visit.    Allergies:   Norvasc; Cephalexin; Codeine; Noroxin; Sulfa antibiotics; and Penicillins    Social History:  The patient  reports that she has never smoked. She does not have any smokeless tobacco history on file. She reports that she does not drink alcohol or use illicit drugs.   Family History:  The patient's family history includes Cancer in her father and sister; Diabetes in her mother; Heart disease in her  mother; Hypertension in her mother; Stroke in her mother.    ROS:  Please see the history of present illness.   Otherwise, review of systems are positive for none.   All other systems are reviewed and negative.    PHYSICAL EXAM: VS:  BP 110/80 mmHg  Pulse 98  Ht 5\' 4"  (1.626 m)  Wt 234 lb 12.8 oz (106.505 kg)  BMI 40.28 kg/m2 , BMI Body mass index is 40.28 kg/(m^2). GEN: Well nourished, well developed, in no acute distress HEENT: normal Neck: no JVD, carotid bruits, or masses Cardiac: RRR; no murmurs, rubs, or gallops,no edema  Respiratory:  clear to auscultation bilaterally, normal work of breathing GI: soft, nontender, nondistended, + BS MS: no deformity or atrophy Skin: warm and dry, no rash Neuro:  Strength and sensation are intact Psych: euthymic mood, full affect   EKG:  EKG was ordered today and showed NSR at 98bpm with RBBB and PVC's    Recent Labs: 09/18/2014: BUN 12; Creatinine, Ser 1.0; Potassium 3.7; Sodium 136    Lipid Panel No results found for: CHOL, TRIG, HDL, CHOLHDL, VLDL, LDLCALC, LDLDIRECT    Wt Readings from Last 3 Encounters:  08/21/15 234 lb 12.8 oz (106.505 kg)  08/08/15 232 lb 8 oz (105.461 kg)  11/27/14 220 lb (99.791 kg)    ASSESSMENT/PLAN: 1. 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  Recent echo showed moderate to severely reduced LVF with EF 30-35%.  Continue ARB.   - Continue ASA 2. HTN - BP controlled on current medications - continue HCTZ - start Coreg 3.125mg  BID - change Benicar to Losartan 50mg  daily - check BMET 3. RBBB - with no ischemia on nuclear stress test in 2011 in setting of incomplete RBBB 4.  SOB - this could be from weight gain but she had an echo recently that showed moderate to severely reduced LVF with EF 30-35%.  She has not had a stress test in 5 years and is also having exertional fatigue.  She is scheduled for a Lexiscan myoview on Monday.  Check BNP.     Current  medicines are reviewed at length with the patient today.  The patient does not have concerns regarding medicines.  The following changes have been made:  no change  Labs/ tests ordered today:  See above Assessment and Plan No orders of the defined types were placed in this encounter.     Disposition:   FU with me in 3 months  Signed, Sueanne Margarita, MD  08/21/2015 12:16 PM    [facility] [address], [place], [place]  [address] Phone: [phone]; Fax: [phone]

## 2015-08-21 NOTE — Patient Instructions (Addendum)
Medication Instructions:  Your physician has recommended you make the following change in your medication:  1) STOP BENICAR 2) START LOSARTAN 50 mg daily 3) START COREG 3.125 mg TWICE DAILY  Labwork: TODAY: BMET  Testing/Procedures: None  Follow-Up: Your physician recommends that you schedule a follow-up appointment in 2 weeks with a PA or NP.  Your physician recommends that you schedule a follow-up appointment in: 3 months with Dr. Radford Pax.  Any Other Special Instructions Will Be Listed Below (If Applicable).

## 2015-08-21 NOTE — Telephone Encounter (Signed)
Patient given detailed instructions per Myocardial Perfusion Study Information Sheet for test on 08-25-2015 at 1245. Patient notified to arrive at 1215 and that it is imperative to arrive on time for appointment to keep from having the test rescheduled. If you need to cancel or reschedule your appointment, please call the office within 24 hours of your appointment. Failure to do so may result in a cancellation of your appointment, and a $50 no show fee. Patient verbalized understanding. Oletta Lamas, Navin Dogan A

## 2015-08-25 ENCOUNTER — Ambulatory Visit (HOSPITAL_COMMUNITY): Attending: Cardiovascular Disease

## 2015-08-25 DIAGNOSIS — I10 Essential (primary) hypertension: Secondary | ICD-10-CM | POA: Diagnosis not present

## 2015-08-25 DIAGNOSIS — R9439 Abnormal result of other cardiovascular function study: Secondary | ICD-10-CM | POA: Diagnosis not present

## 2015-08-25 DIAGNOSIS — R0609 Other forms of dyspnea: Secondary | ICD-10-CM | POA: Diagnosis not present

## 2015-08-25 DIAGNOSIS — I4510 Unspecified right bundle-branch block: Secondary | ICD-10-CM | POA: Diagnosis not present

## 2015-08-25 DIAGNOSIS — R9430 Abnormal result of cardiovascular function study, unspecified: Secondary | ICD-10-CM

## 2015-08-25 MED ORDER — TECHNETIUM TC 99M SESTAMIBI GENERIC - CARDIOLITE
32.4000 | Freq: Once | INTRAVENOUS | Status: AC | PRN
  Administered 2015-08-25: 32 via INTRAVENOUS

## 2015-08-25 MED ORDER — REGADENOSON 0.4 MG/5ML IV SOLN
0.4000 mg | Freq: Once | INTRAVENOUS | Status: AC
  Administered 2015-08-25: 0.4 mg via INTRAVENOUS

## 2015-08-26 ENCOUNTER — Telehealth: Payer: Self-pay | Admitting: Cardiology

## 2015-08-26 ENCOUNTER — Ambulatory Visit (HOSPITAL_BASED_OUTPATIENT_CLINIC_OR_DEPARTMENT_OTHER)

## 2015-08-26 DIAGNOSIS — R0989 Other specified symptoms and signs involving the circulatory and respiratory systems: Secondary | ICD-10-CM

## 2015-08-26 LAB — MYOCARDIAL PERFUSION IMAGING
CHL CUP MPHR: 160 {beats}/min
CHL CUP NUCLEAR SDS: 4
CSEPED: 2 min
CSEPPHR: 133 {beats}/min
Estimated workload: 4.6 METS
Exercise duration (sec): 54 s
LHR: 0.52
LVDIAVOL: 114 mL
LVSYSVOL: 61 mL
NUC STRESS TID: 0.89
Percent HR: 83 %
RPE: 19
Rest HR: 74 {beats}/min
SRS: 6
SSS: 10

## 2015-08-26 MED ORDER — TECHNETIUM TC 99M SESTAMIBI GENERIC - CARDIOLITE
32.4000 | Freq: Once | INTRAVENOUS | Status: AC | PRN
  Administered 2015-08-26: 32 via INTRAVENOUS

## 2015-08-26 NOTE — Telephone Encounter (Signed)
Pt c/o medication issue:  1. Name of Medication: carvedilol   Losartan   2. How are you currently taking this medication (dosage and times per day)? 3.15mg     50mg   3. Are you having a reaction (difficulty breathing--STAT)? Yes   4. What is your medication issue? Weak and lightheaded

## 2015-08-26 NOTE — Telephone Encounter (Signed)
I spoke with the patient. She calls today stating that she feels like she is having an allergic reaction to her medications. She is complaining of weakness, fatigue, and lightheadedness. She was seen by Dr. Radford Pax on 08/21/15 and Benicar was stopped and Coreg 3.125 mg BID was started along with Losartan 50 mg daily. The patient had the first part of her myoview yesterday, but had symptoms prior to her myoview. She is due for part 2 today. She has not been checking her BP/ HR at home. She does state that she is supposed to also be on HCTZ, but inadvertantly threw this out about a week ago. I advised the patient that with the addition of two new medications, that the effects could be coming from either drug. I will review with Dr. Radford Pax and call her back. The patient is agreeable. Alvis Lemmings, RN, BSN  I reviewed with Dr. Radford Pax. Recommendations are to decrease losartan to 25 mg once daily, maintain current coreg dose, and stay off HCTZ. I have notified the patient of this and she verbalizes understanding. I have advised her to check her BP at the pharmacy/ fire dept intermittently if she is able to make sure her readings are stable. She is agreeable. Alvis Lemmings, RN, BSN

## 2015-08-27 NOTE — Patient Instructions (Addendum)
VIDEL NOBREGA  08/27/2015   Your procedure is scheduled on: Tuesday 09/02/2015  Report to [facility] Main  Entrance take [place]  elevators to 3rd floor to  [place] at  [place]  AM.  Call this number if you have problems the morning of surgery [phone]   Remember: ONLY 1 PERSON MAY GO WITH YOU TO SHORT STAY TO GET  READY MORNING OF [place].   Do not eat food or drink liquids :After Midnight.    REMEMBER TO FOLLOW A CLEAR LIQUID DIET THE DAY BEFORE SURGERY ON Monday 09/01/2015-ALL DAY TIL MIDNIGHT AND NO CARBONATED BEVERAGES!   Take these medicines the morning of surgery with A SIP OF WATER: ASTELIN NASAL SPRAY, FLONASE NASAL SPRAY, CARVEDILOL (COREG), CYMBALTA, PRILOSEC              DO NOT TAKE ANY DIABETIC MEDICATIONS DAY OF YOUR SURGERY                               You may not have any metal on your body including hair pins and              piercings  Do not wear jewelry, make-up, lotions, powders or perfumes, deodorant             Do not wear nail polish.  Do not shave  48 hours prior to surgery.              Men may shave face and neck.   Do not bring valuables to the hospital. [place].  Contacts, dentures or bridgework may not be worn into surgery.  Leave suitcase in the car. After surgery it may be brought to your room.     Patients discharged the day of surgery will not be allowed to drive home.  Name and phone number of your driver:  Special Instructions: N/A              Please read over the following fact sheets you were given: _____________________________________________________________________             [facility] - Preparing for Surgery Before surgery, you can play an important role.  Because skin is not sterile, your skin needs to be as free of germs as possible.  You can reduce the number of germs on your skin by washing with CHG (chlorahexidine gluconate) soap before  surgery.  CHG is an antiseptic cleaner which kills germs and bonds with the skin to continue killing germs even after washing. Please DO NOT use if you have an allergy to CHG or antibacterial soaps.  If your skin becomes reddened/irritated stop using the CHG and inform your nurse when you arrive at Short Stay. Do not shave (including legs and underarms) for at least 48 hours prior to the first CHG shower.  You may shave your face/neck. Please follow these instructions carefully:  1.  Shower with CHG Soap the night before surgery and the  morning of Surgery.  2.  If you choose to wash your hair, wash your hair first as usual with your  normal  shampoo.  3.  After you shampoo, rinse your hair and body thoroughly to remove the  shampoo.  4.  Use CHG as you would any other liquid soap.  You can apply chg directly  to the skin and wash                       Gently with a scrungie or clean washcloth.  5.  Apply the CHG Soap to your body ONLY FROM THE NECK DOWN.   Do not use on face/ open                           Wound or open sores. Avoid contact with eyes, ears mouth and genitals (private parts).                       Wash face,  Genitals (private parts) with your normal soap.             6.  Wash thoroughly, paying special attention to the area where your surgery  will be performed.  7.  Thoroughly rinse your body with warm water from the neck down.  8.  DO NOT shower/wash with your normal soap after using and rinsing off  the CHG Soap.                9.  Pat yourself dry with a clean towel.            10.  Wear clean pajamas.            11.  Place clean sheets on your bed the night of your first shower and do not  sleep with pets. Day of Surgery : Do not apply any lotions/deodorants the morning of surgery.  Please wear clean clothes to the hospital/surgery center.  FAILURE TO FOLLOW THESE INSTRUCTIONS MAY RESULT IN THE CANCELLATION OF YOUR SURGERY PATIENT  SIGNATURE_________________________________  NURSE SIGNATURE__________________________________  ________________________________________________________________________    CLEAR LIQUID DIET   Foods Allowed                                                                     Foods Excluded  Coffee and tea, regular and decaf                             liquids that you cannot  Plain Jell-O in any flavor                                             see through such as: Fruit ices (not with fruit pulp)                                     milk, soups, orange juice  Iced Popsicles                                    All solid food Carbonated beverages, regular and diet  Cranberry, grape and apple juices Sports drinks like Gatorade Lightly seasoned clear broth or consume(fat free) Sugar, honey syrup  Sample Menu Breakfast                                Lunch                                     Supper Cranberry juice                    Beef broth                            Chicken broth Jell-O                                     Grape juice                           Apple juice Coffee or tea                        Jell-O                                      Popsicle                                                Coffee or tea                        Coffee or tea  _____________________________________________________________________    Incentive Spirometer  An incentive spirometer is a tool that can help keep your lungs clear and active. This tool measures how well you are filling your lungs with each breath. Taking long deep breaths may help reverse or decrease the chance of developing breathing (pulmonary) problems (especially infection) following:  A long period of time when you are unable to move or be active. BEFORE THE PROCEDURE   If the spirometer includes an indicator to show your best effort, your nurse or respiratory therapist will set it to a  desired goal.  If possible, sit up straight or lean slightly forward. Try not to slouch.  Hold the incentive spirometer in an upright position. INSTRUCTIONS FOR USE   Sit on the edge of your bed if possible, or sit up as far as you can in bed or on a chair.  Hold the incentive spirometer in an upright position.  Breathe out normally.  Place the mouthpiece in your mouth and seal your lips tightly around it.  Breathe in slowly and as deeply as possible, raising the piston or the ball toward the top of the column.  Hold your breath for 3-5 seconds or for as long as possible. Allow the piston or ball to fall to the bottom of the column.  Remove the mouthpiece from your mouth and breathe out normally.  Rest for a few seconds and repeat Steps 1 through 7 at  least 10 times every 1-2 hours when you are awake. Take your time and take a few normal breaths between deep breaths.  The spirometer may include an indicator to show your best effort. Use the indicator as a goal to work toward during each repetition.  After each set of 10 deep breaths, practice coughing to be sure your lungs are clear. If you have an incision (the cut made at the time of surgery), support your incision when coughing by placing a pillow or rolled up towels firmly against it. Once you are able to get out of bed, walk around indoors and cough well. You may stop using the incentive spirometer when instructed by your caregiver.  RISKS AND COMPLICATIONS  Take your time so you do not get dizzy or light-headed.  If you are in pain, you may need to take or ask for pain medication before doing incentive spirometry. It is harder to take a deep breath if you are having pain. AFTER USE  Rest and breathe slowly and easily.  It can be helpful to keep track of a log of your progress. Your caregiver can provide you with a simple table to help with this. If you are using the spirometer at home, follow these instructions: [place] IF:   You are having difficultly using the spirometer.  You have trouble using the spirometer as often as instructed.  Your pain medication is not giving enough relief while using the spirometer.  You develop fever of 100.5 F (38.1 C) or higher. SEEK IMMEDIATE MEDICAL CARE IF:   You cough up bloody sputum that had not been present before.  You develop fever of 102 F (38.9 C) or greater.  You develop worsening pain at or near the incision site. MAKE SURE YOU:   Understand these instructions.  Will watch your condition.  Will get help right away if you are not doing well or get worse. Document Released: 03/07/2007 Document Revised: 01/17/2012 Document Reviewed: 05/08/2007 ExitCare Patient Information 2014 Exit[employer], [place].   ________________________________________________________________________  WHAT IS A BLOOD TRANSFUSION? Blood Transfusion Information  A transfusion is the replacement of blood or some of its parts. Blood is made up of multiple cells which provide different functions.  Red blood cells carry oxygen and are used for blood loss replacement.  White blood cells fight against infection.  Platelets control bleeding.  Plasma helps clot blood.  Other blood products are available for specialized needs, such as hemophilia or other clotting disorders. BEFORE THE TRANSFUSION  Who gives blood for transfusions?   Healthy volunteers who are fully evaluated to make sure their blood is safe. This is blood bank blood. Transfusion therapy is the safest it has ever been in the practice of medicine. Before blood is taken from a donor, a complete history is taken to make sure that person has no history of diseases nor engages in risky social behavior (examples are intravenous drug use or sexual activity with multiple partners). The donor's travel history is screened to minimize risk of transmitting infections, such as malaria. The donated blood is tested for  signs of infectious diseases, such as HIV and hepatitis. The blood is then tested to be sure it is compatible with you in order to minimize the chance of a transfusion reaction. If you or a relative donates blood, this is often done in anticipation of surgery and is not appropriate for emergency situations. It takes many days to process the donated blood. RISKS AND COMPLICATIONS Although transfusion  therapy is very safe and saves many lives, the main dangers of transfusion include:   Getting an infectious disease.  Developing a transfusion reaction. This is an allergic reaction to something in the blood you were given. Every precaution is taken to prevent this. The decision to have a blood transfusion has been considered carefully by your caregiver before blood is given. Blood is not given unless the benefits outweigh the risks. AFTER THE TRANSFUSION  Right after receiving a blood transfusion, you will usually feel much better and more energetic. This is especially true if your red blood cells have gotten low (anemic). The transfusion raises the level of the red blood cells which carry oxygen, and this usually causes an energy increase.  The nurse administering the transfusion will monitor you carefully for complications. HOME CARE INSTRUCTIONS  No special instructions are needed after a transfusion. You may find your energy is better. Speak with your caregiver about any limitations on activity for underlying diseases you may have. SEEK MEDICAL CARE IF:   Your condition is not improving after your transfusion.  You develop redness or irritation at the intravenous (IV) site. SEEK IMMEDIATE MEDICAL CARE IF:  Any of the following symptoms occur over the next 12 hours:  Shaking chills.  You have a temperature by mouth above 102 F (38.9 C), not controlled by medicine.  Chest, back, or muscle pain.  People around you feel you are not acting correctly or are confused.  Shortness of breath  or difficulty breathing.  Dizziness and fainting.  You get a rash or develop hives.  You have a decrease in urine output.  Your urine turns a dark color or changes to pink, red, or brown. Any of the following symptoms occur over the next 10 days:  You have a temperature by mouth above 102 F (38.9 C), not controlled by medicine.  Shortness of breath.  Weakness after normal activity.  The white part of the eye turns yellow (jaundice).  You have a decrease in the amount of urine or are urinating less often.  Your urine turns a dark color or changes to pink, red, or brown. Document Released: 10/22/2000 Document Revised: 01/17/2012 Document Reviewed: 06/10/2008 [facility] Patient Information 2014 [place], [place].  _______________________________________________________________________

## 2015-08-28 ENCOUNTER — Telehealth: Payer: Self-pay | Admitting: Cardiology

## 2015-08-28 ENCOUNTER — Encounter (HOSPITAL_COMMUNITY): Payer: Self-pay

## 2015-08-28 ENCOUNTER — Encounter (HOSPITAL_COMMUNITY)
Admission: RE | Admit: 2015-08-28 | Discharge: 2015-08-28 | Disposition: A | Discharge status: Home / Self Care | Source: Ambulatory Visit | Attending: Gynecologic Oncology | Admitting: Gynecologic Oncology

## 2015-08-28 ENCOUNTER — Other Ambulatory Visit: Payer: Self-pay | Admitting: Cardiology

## 2015-08-28 DIAGNOSIS — Z01818 Encounter for other preprocedural examination: Secondary | ICD-10-CM | POA: Diagnosis present

## 2015-08-28 DIAGNOSIS — R9439 Abnormal result of other cardiovascular function study: Secondary | ICD-10-CM

## 2015-08-28 HISTORY — DX: Adverse effect of unspecified anesthetic, initial encounter: T41.45XA

## 2015-08-28 HISTORY — DX: Other specified postprocedural states: Z98.890

## 2015-08-28 HISTORY — DX: Other complications of anesthesia, initial encounter: T88.59XA

## 2015-08-28 HISTORY — DX: Nausea with vomiting, unspecified: R11.2

## 2015-08-28 LAB — CBC WITH DIFFERENTIAL/PLATELET
BASOS ABS: 0 10*3/uL (ref 0.0–0.1)
Basophils Relative: 1 %
EOS ABS: 0.2 10*3/uL (ref 0.0–0.7)
EOS PCT: 3 %
HCT: 39 % (ref 36.0–46.0)
Hemoglobin: 12.5 g/dL (ref 12.0–15.0)
LYMPHS PCT: 40 %
Lymphs Abs: 2 10*3/uL (ref 0.7–4.0)
MCH: 29.8 pg (ref 26.0–34.0)
MCHC: 32.1 g/dL (ref 30.0–36.0)
MCV: 93.1 fL (ref 78.0–100.0)
MONO ABS: 0.7 10*3/uL (ref 0.1–1.0)
Monocytes Relative: 13 %
Neutro Abs: 2.1 10*3/uL (ref 1.7–7.7)
Neutrophils Relative %: 43 %
PLATELETS: 224 10*3/uL (ref 150–400)
RBC: 4.19 MIL/uL (ref 3.87–5.11)
RDW: 14.6 % (ref 11.5–15.5)
WBC: 4.9 10*3/uL (ref 4.0–10.5)

## 2015-08-28 LAB — ABO/RH: ABO/RH(D): O POS

## 2015-08-28 LAB — COMPREHENSIVE METABOLIC PANEL
ALT: 33 U/L (ref 14–54)
AST: 40 U/L (ref 15–41)
Albumin: 3.2 g/dL — ABNORMAL LOW (ref 3.5–5.0)
Alkaline Phosphatase: 88 U/L (ref 38–126)
Anion gap: 3 — ABNORMAL LOW (ref 5–15)
BILIRUBIN TOTAL: 0.5 mg/dL (ref 0.3–1.2)
BUN: 14 mg/dL (ref 6–20)
CHLORIDE: 108 mmol/L (ref 101–111)
CO2: 29 mmol/L (ref 22–32)
Calcium: 9.2 mg/dL (ref 8.9–10.3)
Creatinine, Ser: 1.03 mg/dL — ABNORMAL HIGH (ref 0.44–1.00)
GFR calc Af Amer: >60 mL/min (ref >60)
GFR calc non Af Amer: 58 mL/min — ABNORMAL LOW (ref >60)
GLUCOSE: 88 mg/dL (ref 65–99)
POTASSIUM: 5 mmol/L (ref 3.5–5.1)
SODIUM: 140 mmol/L (ref 135–145)
Total Protein: 7 g/dL (ref 6.5–8.1)

## 2015-08-28 LAB — URINALYSIS, ROUTINE W REFLEX MICROSCOPIC
Bilirubin Urine: NEGATIVE
Glucose, UA: NEGATIVE mg/dL
Hgb urine dipstick: NEGATIVE
KETONES UR: NEGATIVE mg/dL
NITRITE: NEGATIVE
PROTEIN: NEGATIVE mg/dL
Specific Gravity, Urine: 1.012 (ref 1.005–1.030)
UROBILINOGEN UA: 1 mg/dL (ref 0.0–1.0)
pH: 6 (ref 5.0–8.0)

## 2015-08-28 LAB — URINE MICROSCOPIC-ADD ON

## 2015-08-28 MED ORDER — LOSARTAN POTASSIUM 50 MG PO TABS: ORAL_TABLET | ORAL | Status: DC

## 2015-08-28 NOTE — Telephone Encounter (Signed)
New message     Pt states she has gained 9 lbs since Monday.  There has been a change in her medications but she cannot think of the names of the medicine.  Please advise

## 2015-08-28 NOTE — Progress Notes (Signed)
   08/28/15 0912  OBSTRUCTIVE SLEEP APNEA  Have you ever been diagnosed with sleep apnea through a sleep study? No  Do you snore loudly (loud enough to be heard through closed doors)?  1  Do you often feel tired, fatigued, or sleepy during the daytime (such as falling asleep during driving or talking to someone)? 1  Has anyone observed you stop breathing during your sleep? 0  Do you have, or are you being treated for high blood pressure? 1  BMI more than 35 kg/m2? 1  Age > 50 (1-yes) 1  Neck circumference greater than:Female 16 inches or larger, Female 17inches or larger? 1  Female Gender (Yes=1) 0  Obstructive Sleep Apnea Score 6  Score 5 or greater  Results sent to PCP

## 2015-08-28 NOTE — Telephone Encounter (Signed)
Spoke to patient. She went for pre op at [facility] today and noted that her weight is up 7 # since she came for the stress test. It is generalized. "I'm puffy everywhere, my face, my hands, my legs". Per previous phone note, pt called with weakness after recent med changes, at that time she mentioned that she inadvertently threw away her HCTZ.  When reviewed with Dr. Radford Pax patient was advised to stay off HCTZ.  I discussed that her Br[place] Fling was abnormal and that a Coronary CTA is recommended.   We also discussed that cardiology testing my delay her surgery, which is scheduled for 09/02/15.  Pt is aware that this will all be reviewed with Dr. Radford Pax and someone will call her back with a plan.  Pt requests call back on her mobile #.  Notes Recorded by Sueanne Margarita, MD on 08/26/2015 at 8:57 PM Stress test abnormal but patient has no chest pain. She has had SOB but also has gained weight. Please get a coronary CTA with morph and calcium score

## 2015-08-29 NOTE — Telephone Encounter (Signed)
Follow up      Calling to see if her surgery is still on or did Dr Radford Pax cancel it?

## 2015-08-29 NOTE — Telephone Encounter (Signed)
Informed patient she cannot be cleared until CTA is done.  Instructed patient to see PCP for puffiness. Patient agrees with treatment plan.

## 2015-08-29 NOTE — Telephone Encounter (Signed)
Cannot clear for surgery until coronary CTA done - needs to see PCP for puffiness all over

## 2015-09-01 ENCOUNTER — Telehealth: Payer: Self-pay | Admitting: Gynecologic Oncology

## 2015-09-01 ENCOUNTER — Ambulatory Visit (INDEPENDENT_AMBULATORY_CARE_PROVIDER_SITE_OTHER): Admitting: Cardiology

## 2015-09-01 ENCOUNTER — Encounter: Payer: Self-pay | Admitting: Cardiology

## 2015-09-01 ENCOUNTER — Telehealth: Payer: Self-pay

## 2015-09-01 VITALS — BP 122/74 | HR 78 | Ht 64.0 in | Wt 236.6 lb

## 2015-09-01 DIAGNOSIS — I420 Dilated cardiomyopathy: Secondary | ICD-10-CM

## 2015-09-01 MED ORDER — CARVEDILOL 6.25 MG PO TABS: 6.2500 mg | ORAL_TABLET | Freq: Two times a day (BID) | ORAL | Status: DC

## 2015-09-01 MED ORDER — FUROSEMIDE 20 MG PO TABS: 20.0000 mg | ORAL_TABLET | ORAL | Status: DC | PRN

## 2015-09-01 NOTE — Telephone Encounter (Signed)
Received a phone call from Cardiology this am stating that patient's surgery tomorrow will need to be rescheduled due to an abnormal stress test since she needs further testing per Dr. Radford Pax in order to see if she can be cleared from a cardiology standpoint.  Message left for pre-op testing.  Surgery moved to Nov 22 pending clearance.  Dr. Denman George notified.

## 2015-09-01 NOTE — Progress Notes (Signed)
09/01/2015 Meagan Ryan   1956/02/16  [id number]  Primary Physician Gennette Pac, MD Primary Cardiologist: Dr. Radford Pax   Reason for Visit/CC: 2 week follow-up for medication titration for dilated cardiomyopathy  HPI:  The patient is  a 59 year old female, followed by Dr. Radford Pax. 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  Her Ejection fraction is 30-35%. She was recently seen by Dr. Radford Pax 2 weeks ago on 08/21/2015. At that visit, Dr. Radford Pax initiated 3.125 mg of carvedilol twice a day. She also discontinued her Benicar and started losartan 50 mg daily. F/u comprehensive metabolic panel on 40/06/6760 revealed stable renal function with a serum creatinine of 1.03 which is her baseline. Potassium was stable at 5.0.   Also it should be noted that the patient was recently diagnosed with an ovarian cyst. The patient has elected for elective hysterectomy. However given recent complaints of exertional dyspnea, it was recommended that she undergo a nuclear stress tests as part of her preoperative workup. This was an abnormal study. Given the fact that she has denied chest discomfort, Dr. Radford Pax recommended that she undergo further evaluation with a coronary CTA + calcium score prior to receiving surgical clearance. This has not yet been performed. Subsequently, her surgery has been placed on hold. The results of her coronary CTA will be followed by Dr. Radford Pax.   The patient primarily presents to clinic today for medication surveillance given recent medication adjustments for her cardiomyopathy. Her only complaint since starting carvedilol has been some mild fatigue. Heart rate today in clinic is 78 bpm. Blood pressure is 122/74. She reports full medication compliance. She also notes occasional weight gain and lower extremity edema, worse since discontinuing her hydrochlorothiazide. She continues to  have dyspnea on exertion but she  denies any resting dyspnea and no orthopnea or PND. She continues to deny chest pain. No syncope/near-syncope.    Current Outpatient Prescriptions  Medication Sig Dispense Refill  . ALPRAZolam (XANAX) 0.5 MG tablet Take 0.5 mg by mouth at bedtime as needed for anxiety or sleep.     Marland Kitchen aspirin 81 MG tablet Take 81 mg by mouth every morning.     Marland Kitchen azelastine (ASTELIN) 0.1 % nasal spray Place 1 spray into both nostrils 2 (two) times daily as needed for rhinitis or allergies.     . carvedilol (COREG) 3.125 MG tablet Take 1 tablet (3.125 mg total) by mouth 2 (two) times daily with a meal. 60 tablet 6  . cetirizine (ZYRTEC) 10 MG tablet Take 10 mg by mouth every morning.     . Cholecalciferol (VITAMIN D-3 PO) Take 1 tablet by mouth 2 (two) times daily.     . DULoxetine (CYMBALTA) 30 MG capsule Take 30 mg by mouth at bedtime.     . fish oil-omega-3 fatty acids 1000 MG capsule Take 2 g by mouth 2 (two) times daily.     . fluticasone (FLONASE) 50 MCG/ACT nasal spray Place 1 spray into both nostrils every morning.     Marland Kitchen losartan (COZAAR) 50 MG tablet Take 1/2 tablet (25 mg) by mouth once daily 45 tablet 3  . meclizine (ANTIVERT) 12.5 MG tablet Take 12.5 mg by mouth 3 (three) times daily as needed for dizziness.    . niacin (NIASPAN) 500 MG CR tablet Take 500 mg by mouth every morning.     Marland Kitchen omeprazole (PRILOSEC) 10 MG capsule Take 10 mg by mouth every morning.     Marland Kitchen  potassium chloride (K-DUR) 10 MEQ tablet Take 10 mEq by mouth every morning.     . raloxifene (EVISTA) 60 MG tablet Take 60 mg by mouth every morning.      No current facility-administered medications for this visit.    Allergies  Allergen Reactions  . Norvasc [Amlodipine Besylate] Shortness Of Breath    Breathing difficulties  . Cephalexin Other (See Comments)    High temperature  . Codeine Nausea And Vomiting  . Noroxin [Norfloxacin]     Pt do not remember  . Sulfa Antibiotics Nausea And Vomiting  . Penicillins Rash    Has  patient had a PCN reaction causing immediate rash, facial/tongue/throat swelling, SOB or lightheadedness with hypotension:No Has patient had a PCN reaction causing severe rash involving mucus membranes or skin necrosis: Yes Has patient had a PCN reaction that required hospitalization No Has patient had a PCN reaction occurring within the last 10 years: No If all of the above answers are "NO", then may proceed with Cephalosporin use.     Social History   Social History  . Marital Status: Widowed    Spouse Name: N/A  . Number of Children: N/A  . Years of Education: N/A   Occupational History  . Not on file.   Social History Main Topics  . Smoking status: Never Smoker   . Smokeless tobacco: Not on file  . Alcohol Use: No  . Drug Use: No  . Sexual Activity: Not on file   Other Topics Concern  . 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  Cardiovascular: negative for chest pain, dyspnea on exertion, edema, orthopnea, palpitations, paroxysmal nocturnal dyspnea or shortness of breath Dermatological: negative for rash Respiratory: negative for cough or wheezing Urologic: negative for hematuria Abdominal: negative for nausea, vomiting, diarrhea, bright red blood per rectum, melena, or hematemesis Neurologic: negative for visual changes, syncope, or dizziness All other systems reviewed and are otherwise negative except as noted above.    Blood pressure 122/74, pulse 78, height 5\' 4"  (1.626 m), weight 236 lb 9.6 oz (107.321 kg), SpO2 92 %.  General appearance: alert, cooperative and no distress Neck: no carotid bruit and no JVD Lungs: clear  to auscultation bilaterally Heart: regular rate and rhythm, S1, S2 normal, no murmur, click, rub or gallop Extremities: no LEE Pulses: 2+ and symmetric Skin: warm and dry Neurologic: Grossly normal  EKG not performed   ASSESSMENT AND PLAN:   1. Dilated cardiomyopathy: EF is 30-35%. HR is 78 bpm. Blood pressure is 122/74. We will continue losartan at 50 mg. We'll further titrate her carvedilol up to 6.25 mg twice a day. Given her concerns for periodic weight gain and lower extremity edema, we will add low-dose Lasix, 20 mg, only to be used PRN based on daily weights. I've recommended lasix on days when her weight is > 3lb above her dry weight. Low sodium diet advised.  2. DOE: NST is abnormal. She denies CP. Per Dr. Radford Pax, will plan for coronary CT + calcium score for further assessment prior to granting surgical clearance.   3. Ovarian Cyst: elective  hysterectomy is currently on hold until ischemic w/u is completed. Dr. Serita Grit office was notified.   PLAN  F/u with Dr. Radford Pax post coronary CTA study.   Lyda Jester PA-C 09/01/2015 8:43 AM

## 2015-09-01 NOTE — Telephone Encounter (Signed)
Called Dr. Serita Grit office about patient's surgery tomorrow. Informed them that according to Dr. Theodosia Blender note, "Cannot clear for surgery until coronary CTA done". Dr. Serita Grit office will call back with any concerns or questions.

## 2015-09-01 NOTE — Patient Instructions (Addendum)
Medication Instructions:  Your physician has recommended you make the following change in your medication:  1- START Coreg 6.25 mg by mouth twice dialy 2- Take Lasix 20 mg by mouth as needed for weight gain of more then 3 pounds of dry weight. Take an extra potassium supplement with this medication.  Labwork: NONE today   Testing/Procedures: NONE  Follow-Up: Your physician wants you to follow-up in: 4 months with Dr. Radford Pax. You will receive a reminder letter in the mail two months in advance. If you don't receive a letter, please call our office to schedule the follow-up appointment.

## 2015-09-01 NOTE — Telephone Encounter (Signed)
Patient rescheduled to 11/22 per EPIC.

## 2015-09-01 NOTE — Telephone Encounter (Signed)
Patient's surgery has been moved to 09/30/15.

## 2015-09-02 LAB — TYPE AND SCREEN
ABO/RH(D): O POS
Antibody Screen: NEGATIVE

## 2015-09-03 ENCOUNTER — Telehealth: Payer: Self-pay | Admitting: Genetic Counselor

## 2015-09-03 NOTE — Telephone Encounter (Signed)
genetic appt-s/w patient and genetic appt for 11/15 @ 9 w/Kayla Boggs.

## 2015-09-04 ENCOUNTER — Encounter (HOSPITAL_COMMUNITY): Payer: Self-pay

## 2015-09-04 ENCOUNTER — Telehealth: Payer: Self-pay | Admitting: Cardiology

## 2015-09-04 ENCOUNTER — Ambulatory Visit (HOSPITAL_COMMUNITY)
Admission: RE | Admit: 2015-09-04 | Discharge: 2015-09-04 | Disposition: A | Discharge status: Home / Self Care | Source: Ambulatory Visit | Attending: Cardiology | Admitting: Cardiology

## 2015-09-04 DIAGNOSIS — R9439 Abnormal result of other cardiovascular function study: Secondary | ICD-10-CM | POA: Diagnosis not present

## 2015-09-04 DIAGNOSIS — I420 Dilated cardiomyopathy: Secondary | ICD-10-CM

## 2015-09-04 DIAGNOSIS — R079 Chest pain, unspecified: Secondary | ICD-10-CM | POA: Diagnosis not present

## 2015-09-04 MED ORDER — NITROGLYCERIN 0.4 MG SL SUBL
0.8000 mg | SUBLINGUAL_TABLET | SUBLINGUAL | Status: DC | PRN
  Administered 2015-09-04: 0.08 mg via SUBLINGUAL
  Filled 2015-09-04 (×2): qty 25

## 2015-09-04 MED ORDER — METOPROLOL TARTRATE 1 MG/ML IV SOLN
5.0000 mg | INTRAVENOUS | Status: DC | PRN
  Administered 2015-09-04 (×2): 5 mg via INTRAVENOUS
  Filled 2015-09-04 (×3): qty 5

## 2015-09-04 MED ORDER — METOPROLOL TARTRATE 1 MG/ML IV SOLN
INTRAVENOUS | Status: AC
  Administered 2015-09-04: 5 mg via INTRAVENOUS
  Filled 2015-09-04: qty 5

## 2015-09-04 MED ORDER — NITROGLYCERIN 0.4 MG SL SUBL
SUBLINGUAL_TABLET | SUBLINGUAL | Status: AC
  Administered 2015-09-04: 0.08 mg via SUBLINGUAL
  Filled 2015-09-04: qty 2

## 2015-09-04 MED ORDER — IOHEXOL 350 MG/ML SOLN
80.0000 mL | Freq: Once | INTRAVENOUS | Status: AC | PRN
  Administered 2015-09-04: 80 mL via INTRAVENOUS

## 2015-09-04 NOTE — Telephone Encounter (Signed)
Please have patient followup with me in 3 months with echo at that time as well to assess EF on medical therapy

## 2015-09-10 ENCOUNTER — Other Ambulatory Visit: Payer: Self-pay | Admitting: Cardiology

## 2015-09-10 ENCOUNTER — Telehealth: Payer: Self-pay

## 2015-09-10 MED ORDER — FUROSEMIDE 20 MG PO TABS: 20.0000 mg | ORAL_TABLET | ORAL | Status: DC | PRN

## 2015-09-10 MED ORDER — CARVEDILOL 6.25 MG PO TABS: 6.2500 mg | ORAL_TABLET | Freq: Two times a day (BID) | ORAL | Status: DC

## 2015-09-10 NOTE — Telephone Encounter (Signed)
Patient contacted and updated with being scheduled for surgery with Dr Everitt Amber on September 30, 2015 at 06:30 Am . Patient also informed that she will be receiving a call from our Pre Admission testing soon . patient states understanding , denies additional questions and or concerns at this time.

## 2015-09-11 NOTE — Addendum Note (Signed)
Addended by: Harland German A on: 09/11/2015 02:49 PM   Modules accepted: Orders

## 2015-09-11 NOTE — Telephone Encounter (Signed)
FU scheduled 1/18. ECHO ordered to be scheduled same day.  Patient agrees with treatment plan.

## 2015-09-12 ENCOUNTER — Telehealth: Payer: Self-pay | Admitting: Gynecologic Oncology

## 2015-09-12 NOTE — Telephone Encounter (Signed)
Patient returned call to office.  Had offered her to move to Nov 15 for surgery.  She states she wants to move it to Dec or Jan.  Advised that there could always be a chance that the cyst was cancerous and we would like not to wait.  Verbalizing understanding.  Wanting to move surgery to Dec 1.  Advised to call for any questions or concerns.

## 2015-09-18 ENCOUNTER — Telehealth: Payer: Self-pay | Admitting: Gynecologic Oncology

## 2015-09-18 NOTE — Telephone Encounter (Signed)
Returned call to patient.  Patient had called wanting to have surgery on Nov 15.  Advised that I would contact Dr. Denman George to see if she could be added on that day and she would be contacted tomorrow.  No concerns voiced. Advised to call for any questions or concerns.

## 2015-09-22 ENCOUNTER — Telehealth: Payer: Self-pay | Admitting: Gynecologic Oncology

## 2015-09-22 NOTE — Telephone Encounter (Signed)
Patient informed that her surgery would be on December 1.  Verbalizing understanding.  Informed she would receive a phone call from pre-surgical testing.  Advised to call for any needs or concerns.

## 2015-09-23 ENCOUNTER — Other Ambulatory Visit

## 2015-09-23 ENCOUNTER — Encounter: Admitting: Genetic Counselor

## 2015-09-25 ENCOUNTER — Ambulatory Visit: Admitting: Cardiology

## 2015-09-26 ENCOUNTER — Encounter (HOSPITAL_COMMUNITY): Admission: RE | Admit: 2015-09-26 | Source: Ambulatory Visit

## 2015-09-29 NOTE — Progress Notes (Signed)
Cardiac clearance note dr Tressia Miners turner on chart for 10-09-15 surgery Carotid US 11-04-14 epic Heart ct 09-04-15 lov cardiology 10-24 16 epic  echo 08-13-15 epic  stress test 08-25-15 epic

## 2015-09-29 NOTE — Patient Instructions (Addendum)
Meagan Ryan  09/29/2015   Your procedure is scheduled on: 10-09-15  Report to [facility]  Entrance take [facility]  elevators to 3rd floor to  [place] at 100 pm  Call this number if you have problems the morning of surgery [phone]   Remember: ONLY 1 PERSON MAY GO WITH YOU TO SHORT STAY TO GET  READY MORNING OF YOUR SURGERY.  Do not eat food  :After Midnight Tuesday night, clear liquids all day Wednesday, no carbonated beverages, clear liquids until 900 am day of surgery, nothing by mouth after 900 am day of surgery.     Take these medicines the morning of surgery with A SIP OF WATER: Astelin nasal spray, Carvedilol (Coreg), Certrizine (Zyrtec), Flonase Nasal Spray, Omeprazole (Prilosec) Xanax  DO NOT TAKE ANY DIABETIC MEDICATIONS DAY OF YOUR SURGERY                               You may not have any metal on your body including hair pins and              piercings  Do not wear jewelry, make-up, lotions, powders or perfumes, deodorant             Do not wear nail polish.  Do not shave  48 hours prior to surgery.              Men may shave face and neck.   Do not bring valuables to the hospital. [place].  Contacts, dentures or bridgework may not be worn into surgery.  Leave suitcase in the car. After surgery it may be brought to your room.     Patients discharged the day of surgery will not be allowed to drive home.  Name and phone number of your driver:  Special Instructions: N/A              Please read over the following fact sheets you were given: _____________________________________________________________________                CLEAR LIQUID DIET   Foods Allowed                                                                     Foods Excluded  Coffee and tea, regular and decaf                             liquids that you cannot  Plain Jell-O in any flavor                                              see through such as: Fruit ices (not with fruit pulp)  milk, soups, orange juice  Iced Popsicles                                    All solid food                                  Cranberry, grape and apple juices Sports drinks like Gatorade Lightly seasoned clear broth or consume(fat free) Sugar, honey syrup  Sample Menu Breakfast                                Lunch                                     Supper Cranberry juice                    Beef broth                            Chicken broth Jell-O                                     Grape juice                           Apple juice Coffee or tea                        Jell-O                                      Popsicle                                                Coffee or tea                        Coffee or tea  _____________________________________________________________________  [facility] - Preparing for Surgery Before surgery, you can play an important role.  Because skin is not sterile, your skin needs to be as free of germs as possible.  You can reduce the number of germs on your skin by washing with CHG (chlorahexidine gluconate) soap before surgery.  CHG is an antiseptic cleaner which kills germs and bonds with the skin to continue killing germs even after washing. Please DO NOT use if you have an allergy to CHG or antibacterial soaps.  If your skin becomes reddened/irritated stop using the CHG and inform your nurse when you arrive at Short Stay. Do not shave (including legs and underarms) for at least 48 hours prior to the first CHG shower.  You may shave your face/neck. Please follow these instructions carefully:  1.  Shower with CHG Soap the night before surgery and the  morning of Surgery.  2.  If you choose to wash your hair, wash your hair first as  usual with your  normal  shampoo.  3.  After you shampoo, rinse your hair and body thoroughly to remove the   shampoo.                           4.  Use CHG as you would any other liquid soap.  You can apply chg directly  to the skin and wash                       Gently with a scrungie or clean washcloth.  5.  Apply the CHG Soap to your body ONLY FROM THE NECK DOWN.   Do not use on face/ open                           Wound or open sores. Avoid contact with eyes, ears mouth and genitals (private parts).                       Wash face,  Genitals (private parts) with your normal soap.             6.  Wash thoroughly, paying special attention to the area where your surgery  will be performed.  7.  Thoroughly rinse your body with warm water from the neck down.  8.  DO NOT shower/wash with your normal soap after using and rinsing off  the CHG Soap.                9.  Pat yourself dry with a clean towel.            10.  Wear clean pajamas.            11.  Place clean sheets on your bed the night of your first shower and do not  sleep with pets. Day of Surgery : Do not apply any lotions/deodorants the morning of surgery.  Please wear clean clothes to the hospital/surgery center.  FAILURE TO FOLLOW THESE INSTRUCTIONS MAY RESULT IN THE CANCELLATION OF YOUR SURGERY PATIENT SIGNATURE_________________________________  NURSE SIGNATURE__________________________________  ________________________________________________________________________   Meagan Ryan  An incentive spirometer is a tool that can help keep your lungs clear and active. This tool measures how well you are filling your lungs with each breath. Taking long deep breaths may help reverse or decrease the chance of developing breathing (pulmonary) problems (especially infection) following:  A long period of time when you are unable to move or be active. BEFORE THE PROCEDURE   If the spirometer includes an indicator to show your best effort, your nurse or respiratory therapist will set it to a desired goal.  If possible, sit up straight  or lean slightly forward. Try not to slouch.  Hold the incentive spirometer in an upright position. INSTRUCTIONS FOR USE   Sit on the edge of your bed if possible, or sit up as far as you can in bed or on a chair.  Hold the incentive spirometer in an upright position.  Breathe out normally.  Place the mouthpiece in your mouth and seal your lips tightly around it.  Breathe in slowly and as deeply as possible, raising the piston or the ball toward the top of the column.  Hold your breath for 3-5 seconds or for as long as possible. Allow the piston or ball to fall to the bottom of the column.  Remove the mouthpiece  from your mouth and breathe out normally.  Rest for a few seconds and repeat Steps 1 through 7 at least 10 times every 1-2 hours when you are awake. Take your time and take a few normal breaths between deep breaths.  The spirometer may include an indicator to show your best effort. Use the indicator as a goal to work toward during each repetition.  After each set of 10 deep breaths, practice coughing to be sure your lungs are clear. If you have an incision (the cut made at the time of surgery), support your incision when coughing by placing a pillow or rolled up towels firmly against it. Once you are able to get out of bed, walk around indoors and cough well. You may stop using the incentive spirometer when instructed by your caregiver.  RISKS AND COMPLICATIONS  Take your time so you do not get dizzy or light-headed.  If you are in pain, you may need to take or ask for pain medication before doing incentive spirometry. It is harder to take a deep breath if you are having pain. AFTER USE  Rest and breathe slowly and easily.  It can be helpful to keep track of a log of your progress. Your caregiver can provide you with a simple table to help with this. If you are using the spirometer at home, follow these instructions: [place] IF:   You are having difficultly  using the spirometer.  You have trouble using the spirometer as often as instructed.  Your pain medication is not giving enough relief while using the spirometer.  You develop fever of 100.5 F (38.1 C) or higher. SEEK IMMEDIATE MEDICAL CARE IF:   You cough up bloody sputum that had not been present before.  You develop fever of 102 F (38.9 C) or greater.  You develop worsening pain at or near the incision site. MAKE SURE YOU:   Understand these instructions.  Will watch your condition.  Will get help right away if you are not doing well or get worse. Document Released: 03/07/2007 Document Revised: 01/17/2012 Document Reviewed: 05/08/2007 ExitCare Patient Information 2014 Exit[employer], [place].   ________________________________________________________________________  WHAT IS A BLOOD TRANSFUSION? Blood Transfusion Information  A transfusion is the replacement of blood or some of its parts. Blood is made up of multiple cells which provide different functions.  Red blood cells carry oxygen and are used for blood loss replacement.  White blood cells fight against infection.  Platelets control bleeding.  Plasma helps clot blood.  Other blood products are available for specialized needs, such as hemophilia or other clotting disorders. BEFORE THE TRANSFUSION  Who gives blood for transfusions?   Healthy volunteers who are fully evaluated to make sure their blood is safe. This is blood bank blood. Transfusion therapy is the safest it has ever been in the practice of medicine. Before blood is taken from a donor, a complete history is taken to make sure that person has no history of diseases nor engages in risky social behavior (examples are intravenous drug use or sexual activity with multiple partners). The donor's travel history is screened to minimize risk of transmitting infections, such as malaria. The donated blood is tested for signs of infectious diseases, such as HIV and  hepatitis. The blood is then tested to be sure it is compatible with you in order to minimize the chance of a transfusion reaction. If you or a relative donates blood, this is often done in anticipation of surgery and  is not appropriate for emergency situations. It takes many days to process the donated blood. RISKS AND COMPLICATIONS Although transfusion therapy is very safe and saves many lives, the main dangers of transfusion include:   Getting an infectious disease.  Developing a transfusion reaction. This is an allergic reaction to something in the blood you were given. Every precaution is taken to prevent this. The decision to have a blood transfusion has been considered carefully by your caregiver before blood is given. Blood is not given unless the benefits outweigh the risks. AFTER THE TRANSFUSION  Right after receiving a blood transfusion, you will usually feel much better and more energetic. This is especially true if your red blood cells have gotten low (anemic). The transfusion raises the level of the red blood cells which carry oxygen, and this usually causes an energy increase.  The nurse administering the transfusion will monitor you carefully for complications. HOME CARE INSTRUCTIONS  No special instructions are needed after a transfusion. You may find your energy is better. Speak with your caregiver about any limitations on activity for underlying diseases you may have. SEEK MEDICAL CARE IF:   Your condition is not improving after your transfusion.  You develop redness or irritation at the intravenous (IV) site. SEEK IMMEDIATE MEDICAL CARE IF:  Any of the following symptoms occur over the next 12 hours:  Shaking chills.  You have a temperature by mouth above 102 F (38.9 C), not controlled by medicine.  Chest, back, or muscle pain.  People around you feel you are not acting correctly or are confused.  Shortness of breath or difficulty breathing.  Dizziness and  fainting.  You get a rash or develop hives.  You have a decrease in urine output.  Your urine turns a dark color or changes to pink, red, or brown. Any of the following symptoms occur over the next 10 days:  You have a temperature by mouth above 102 F (38.9 C), not controlled by medicine.  Shortness of breath.  Weakness after normal activity.  The white part of the eye turns yellow (jaundice).  You have a decrease in the amount of urine or are urinating less often.  Your urine turns a dark color or changes to pink, red, or brown. Document Released: 10/22/2000 Document Revised: 01/17/2012 Document Reviewed: 06/10/2008 [facility] Patient Information 2014 [place], [place].  _______________________________________________________________________

## 2015-09-30 ENCOUNTER — Encounter (HOSPITAL_COMMUNITY): Payer: Self-pay

## 2015-09-30 ENCOUNTER — Encounter (HOSPITAL_COMMUNITY)
Admission: RE | Admit: 2015-09-30 | Discharge: 2015-09-30 | Disposition: A | Discharge status: Home / Self Care | Source: Ambulatory Visit | Attending: Gynecologic Oncology | Admitting: Gynecologic Oncology

## 2015-09-30 DIAGNOSIS — Z01818 Encounter for other preprocedural examination: Secondary | ICD-10-CM | POA: Insufficient documentation

## 2015-09-30 DIAGNOSIS — N83201 Unspecified ovarian cyst, right side: Secondary | ICD-10-CM | POA: Insufficient documentation

## 2015-09-30 HISTORY — DX: Family history of other specified conditions: Z84.89

## 2015-09-30 LAB — URINALYSIS, ROUTINE W REFLEX MICROSCOPIC
Bilirubin Urine: NEGATIVE
GLUCOSE, UA: NEGATIVE mg/dL
Hgb urine dipstick: NEGATIVE
KETONES UR: NEGATIVE mg/dL
Nitrite: NEGATIVE
PROTEIN: NEGATIVE mg/dL
Specific Gravity, Urine: 1.015 (ref 1.005–1.030)
pH: 5.5 (ref 5.0–8.0)

## 2015-09-30 LAB — CBC WITH DIFFERENTIAL/PLATELET
BASOS ABS: 0 10*3/uL (ref 0.0–0.1)
BASOS PCT: 1 %
EOS ABS: 0.1 10*3/uL (ref 0.0–0.7)
EOS PCT: 3 %
HEMATOCRIT: 42.7 % (ref 36.0–46.0)
Hemoglobin: 13.7 g/dL (ref 12.0–15.0)
Lymphocytes Relative: 40 %
Lymphs Abs: 1.7 10*3/uL (ref 0.7–4.0)
MCH: 29.7 pg (ref 26.0–34.0)
MCHC: 32.1 g/dL (ref 30.0–36.0)
MCV: 92.6 fL (ref 78.0–100.0)
MONO ABS: 0.5 10*3/uL (ref 0.1–1.0)
MONOS PCT: 12 %
NEUTROS ABS: 1.9 10*3/uL (ref 1.7–7.7)
Neutrophils Relative %: 44 %
PLATELETS: 237 10*3/uL (ref 150–400)
RBC: 4.61 MIL/uL (ref 3.87–5.11)
RDW: 13.9 % (ref 11.5–15.5)
WBC: 4.3 10*3/uL (ref 4.0–10.5)

## 2015-09-30 LAB — COMPREHENSIVE METABOLIC PANEL
ALBUMIN: 3.4 g/dL — AB (ref 3.5–5.0)
ALT: 31 U/L (ref 14–54)
ANION GAP: 8 (ref 5–15)
AST: 44 U/L — AB (ref 15–41)
Alkaline Phosphatase: 85 U/L (ref 38–126)
BUN: 13 mg/dL (ref 6–20)
CHLORIDE: 103 mmol/L (ref 101–111)
CO2: 27 mmol/L (ref 22–32)
Calcium: 9.7 mg/dL (ref 8.9–10.3)
Creatinine, Ser: 1.09 mg/dL — ABNORMAL HIGH (ref 0.44–1.00)
GFR calc Af Amer: >60 mL/min (ref >60)
GFR calc non Af Amer: 54 mL/min — ABNORMAL LOW (ref >60)
GLUCOSE: 94 mg/dL (ref 65–99)
POTASSIUM: 4.6 mmol/L (ref 3.5–5.1)
SODIUM: 138 mmol/L (ref 135–145)
Total Bilirubin: 0.8 mg/dL (ref 0.3–1.2)
Total Protein: 7.5 g/dL (ref 6.5–8.1)

## 2015-09-30 LAB — URINE MICROSCOPIC-ADD ON: RBC / HPF: NONE SEEN RBC/hpf (ref 0–5)

## 2015-10-09 ENCOUNTER — Encounter (HOSPITAL_COMMUNITY)
Admission: RE | Disposition: A | Discharge status: Home / Self Care | Payer: Self-pay | Source: Ambulatory Visit | Attending: Gynecologic Oncology

## 2015-10-09 ENCOUNTER — Ambulatory Visit (HOSPITAL_COMMUNITY): Admitting: Registered Nurse

## 2015-10-09 ENCOUNTER — Encounter (HOSPITAL_COMMUNITY): Payer: Self-pay | Admitting: *Deleted

## 2015-10-09 ENCOUNTER — Ambulatory Visit (HOSPITAL_COMMUNITY)
Admission: RE | Admit: 2015-10-09 | Discharge: 2015-10-10 | Disposition: A | Discharge status: Home / Self Care | Source: Ambulatory Visit | Attending: Gynecologic Oncology | Admitting: Gynecologic Oncology

## 2015-10-09 DIAGNOSIS — J45909 Unspecified asthma, uncomplicated: Secondary | ICD-10-CM | POA: Diagnosis not present

## 2015-10-09 DIAGNOSIS — D270 Benign neoplasm of right ovary: Secondary | ICD-10-CM | POA: Insufficient documentation

## 2015-10-09 DIAGNOSIS — Z7951 Long term (current) use of inhaled steroids: Secondary | ICD-10-CM | POA: Insufficient documentation

## 2015-10-09 DIAGNOSIS — Z853 Personal history of malignant neoplasm of breast: Secondary | ICD-10-CM | POA: Insufficient documentation

## 2015-10-09 DIAGNOSIS — Z9012 Acquired absence of left breast and nipple: Secondary | ICD-10-CM | POA: Diagnosis not present

## 2015-10-09 DIAGNOSIS — Z6841 Body Mass Index (BMI) 40.0 and over, adult: Secondary | ICD-10-CM | POA: Insufficient documentation

## 2015-10-09 DIAGNOSIS — I509 Heart failure, unspecified: Secondary | ICD-10-CM

## 2015-10-09 DIAGNOSIS — IMO0002 Reserved for concepts with insufficient information to code with codable children: Secondary | ICD-10-CM

## 2015-10-09 DIAGNOSIS — N83202 Unspecified ovarian cyst, left side: Secondary | ICD-10-CM

## 2015-10-09 DIAGNOSIS — E1140 Type 2 diabetes mellitus with diabetic neuropathy, unspecified: Secondary | ICD-10-CM | POA: Insufficient documentation

## 2015-10-09 DIAGNOSIS — Z7982 Long term (current) use of aspirin: Secondary | ICD-10-CM | POA: Diagnosis not present

## 2015-10-09 DIAGNOSIS — E785 Hyperlipidemia, unspecified: Secondary | ICD-10-CM | POA: Diagnosis not present

## 2015-10-09 DIAGNOSIS — N838 Other noninflammatory disorders of ovary, fallopian tube and broad ligament: Secondary | ICD-10-CM | POA: Insufficient documentation

## 2015-10-09 DIAGNOSIS — I5022 Chronic systolic (congestive) heart failure: Secondary | ICD-10-CM | POA: Insufficient documentation

## 2015-10-09 DIAGNOSIS — Z79899 Other long term (current) drug therapy: Secondary | ICD-10-CM | POA: Insufficient documentation

## 2015-10-09 DIAGNOSIS — I110 Hypertensive heart disease with heart failure: Secondary | ICD-10-CM | POA: Insufficient documentation

## 2015-10-09 DIAGNOSIS — K760 Fatty (change of) liver, not elsewhere classified: Secondary | ICD-10-CM | POA: Insufficient documentation

## 2015-10-09 DIAGNOSIS — N83201 Unspecified ovarian cyst, right side: Secondary | ICD-10-CM | POA: Diagnosis present

## 2015-10-09 DIAGNOSIS — Z8673 Personal history of transient ischemic attack (TIA), and cerebral infarction without residual deficits: Secondary | ICD-10-CM | POA: Insufficient documentation

## 2015-10-09 DIAGNOSIS — I420 Dilated cardiomyopathy: Secondary | ICD-10-CM | POA: Diagnosis not present

## 2015-10-09 DIAGNOSIS — I4510 Unspecified right bundle-branch block: Secondary | ICD-10-CM | POA: Insufficient documentation

## 2015-10-09 DIAGNOSIS — Z9221 Personal history of antineoplastic chemotherapy: Secondary | ICD-10-CM | POA: Insufficient documentation

## 2015-10-09 DIAGNOSIS — N83209 Unspecified ovarian cyst, unspecified side: Secondary | ICD-10-CM | POA: Diagnosis present

## 2015-10-09 DIAGNOSIS — Z905 Acquired absence of kidney: Secondary | ICD-10-CM | POA: Diagnosis not present

## 2015-10-09 HISTORY — PX: ROBOTIC ASSISTED TOTAL HYSTERECTOMY WITH BILATERAL SALPINGO OOPHERECTOMY: SHX6086

## 2015-10-09 LAB — TYPE AND SCREEN
ABO/RH(D): O POS
Antibody Screen: NEGATIVE

## 2015-10-09 SURGERY — ROBOTIC ASSISTED TOTAL HYSTERECTOMY WITH BILATERAL SALPINGO OOPHORECTOMY
Anesthesia: General | Site: Abdomen | Laterality: Bilateral

## 2015-10-09 MED ORDER — SUGAMMADEX SODIUM 200 MG/2ML IV SOLN
INTRAVENOUS | Status: AC
  Filled 2015-10-09: qty 2

## 2015-10-09 MED ORDER — ROCURONIUM BROMIDE 100 MG/10ML IV SOLN
INTRAVENOUS | Status: DC | PRN
  Administered 2015-10-09: 40 mg via INTRAVENOUS
  Administered 2015-10-09: 30 mg via INTRAVENOUS

## 2015-10-09 MED ORDER — SUCCINYLCHOLINE CHLORIDE 20 MG/ML IJ SOLN
INTRAMUSCULAR | Status: DC | PRN
  Administered 2015-10-09: 100 mg via INTRAVENOUS

## 2015-10-09 MED ORDER — CIPROFLOXACIN IN D5W 400 MG/200ML IV SOLN
INTRAVENOUS | Status: AC
  Filled 2015-10-09: qty 200

## 2015-10-09 MED ORDER — FENTANYL CITRATE (PF) 100 MCG/2ML IJ SOLN
INTRAMUSCULAR | Status: DC | PRN
  Administered 2015-10-09: 50 ug via INTRAVENOUS
  Administered 2015-10-09 (×3): 100 ug via INTRAVENOUS

## 2015-10-09 MED ORDER — ALBUTEROL SULFATE (2.5 MG/3ML) 0.083% IN NEBU
2.5000 mg | INHALATION_SOLUTION | Freq: Four times a day (QID) | RESPIRATORY_TRACT | Status: DC | PRN
  Administered 2015-10-09: 2.5 mg via RESPIRATORY_TRACT

## 2015-10-09 MED ORDER — MIDAZOLAM HCL 2 MG/2ML IJ SOLN
INTRAMUSCULAR | Status: AC
  Filled 2015-10-09: qty 2

## 2015-10-09 MED ORDER — HYDROMORPHONE HCL 1 MG/ML IJ SOLN: 0.2500 mg | INTRAMUSCULAR | Status: DC | PRN

## 2015-10-09 MED ORDER — SODIUM CHLORIDE 0.9 % IJ SOLN: 3.0000 mL | Freq: Two times a day (BID) | INTRAMUSCULAR | Status: DC

## 2015-10-09 MED ORDER — LIDOCAINE HCL (CARDIAC) 20 MG/ML IV SOLN
INTRAVENOUS | Status: DC | PRN
  Administered 2015-10-09: 100 mg via INTRAVENOUS
  Administered 2015-10-09: 25 mg via INTRATRACHEAL

## 2015-10-09 MED ORDER — ALBUTEROL SULFATE (2.5 MG/3ML) 0.083% IN NEBU
INHALATION_SOLUTION | RESPIRATORY_TRACT | Status: AC
  Filled 2015-10-09: qty 3

## 2015-10-09 MED ORDER — HYDROMORPHONE HCL 2 MG/ML IJ SOLN
INTRAMUSCULAR | Status: AC
  Filled 2015-10-09: qty 1

## 2015-10-09 MED ORDER — SCOPOLAMINE 1 MG/3DAYS TD PT72
MEDICATED_PATCH | TRANSDERMAL | Status: AC
  Filled 2015-10-09: qty 1

## 2015-10-09 MED ORDER — HYDROMORPHONE HCL 1 MG/ML IJ SOLN
INTRAMUSCULAR | Status: DC | PRN
  Administered 2015-10-09 (×2): 1 mg via INTRAVENOUS

## 2015-10-09 MED ORDER — OXYCODONE HCL 5 MG PO TABS
5.0000 mg | ORAL_TABLET | ORAL | Status: DC | PRN
  Filled 2015-10-09: qty 1

## 2015-10-09 MED ORDER — SODIUM CHLORIDE 0.9 % IJ SOLN: 3.0000 mL | INTRAMUSCULAR | Status: DC | PRN

## 2015-10-09 MED ORDER — LACTATED RINGERS IV SOLN
INTRAVENOUS | Status: DC | PRN
Start: 2015-10-09 — End: 2015-10-09
  Administered 2015-10-09: 1000 mL

## 2015-10-09 MED ORDER — LACTATED RINGERS IV SOLN
INTRAVENOUS | Status: DC
  Administered 2015-10-09: 17:00:00 via INTRAVENOUS
  Administered 2015-10-09: 1000 mL via INTRAVENOUS

## 2015-10-09 MED ORDER — ONDANSETRON HCL 4 MG/2ML IJ SOLN
INTRAMUSCULAR | Status: DC | PRN
  Administered 2015-10-09: 4 mg via INTRAVENOUS

## 2015-10-09 MED ORDER — PROPOFOL 10 MG/ML IV BOLUS
INTRAVENOUS | Status: AC
  Filled 2015-10-09: qty 20

## 2015-10-09 MED ORDER — CIPROFLOXACIN IN D5W 400 MG/200ML IV SOLN
400.0000 mg | INTRAVENOUS | Status: AC
  Administered 2015-10-09: 400 mg via INTRAVENOUS

## 2015-10-09 MED ORDER — ROCURONIUM BROMIDE 100 MG/10ML IV SOLN
INTRAVENOUS | Status: AC
  Filled 2015-10-09: qty 1

## 2015-10-09 MED ORDER — ACETAMINOPHEN 650 MG RE SUPP
650.0000 mg | RECTAL | Status: DC | PRN
  Filled 2015-10-09: qty 1

## 2015-10-09 MED ORDER — OXYCODONE-ACETAMINOPHEN 10-325 MG PO TABS: 1.0000 | ORAL_TABLET | ORAL | Status: DC | PRN

## 2015-10-09 MED ORDER — ACETAMINOPHEN 325 MG PO TABS: 650.0000 mg | ORAL_TABLET | ORAL | Status: DC | PRN

## 2015-10-09 MED ORDER — SUGAMMADEX SODIUM 200 MG/2ML IV SOLN
INTRAVENOUS | Status: DC | PRN
  Administered 2015-10-09: 200 mg via INTRAVENOUS

## 2015-10-09 MED ORDER — ACETAMINOPHEN 500 MG PO TABS
1000.0000 mg | ORAL_TABLET | Freq: Four times a day (QID) | ORAL | Status: DC
  Filled 2015-10-09 (×5): qty 2

## 2015-10-09 MED ORDER — FENTANYL CITRATE (PF) 250 MCG/5ML IJ SOLN
INTRAMUSCULAR | Status: AC
  Filled 2015-10-09: qty 5

## 2015-10-09 MED ORDER — PROMETHAZINE HCL 25 MG/ML IJ SOLN
INTRAMUSCULAR | Status: AC
  Filled 2015-10-09: qty 1

## 2015-10-09 MED ORDER — PROPOFOL 10 MG/ML IV BOLUS
INTRAVENOUS | Status: DC | PRN
  Administered 2015-10-09: 200 mg via INTRAVENOUS

## 2015-10-09 MED ORDER — PHENYLEPHRINE HCL 10 MG/ML IJ SOLN
INTRAMUSCULAR | Status: DC | PRN
  Administered 2015-10-09 (×2): 80 ug via INTRAVENOUS

## 2015-10-09 MED ORDER — SODIUM CHLORIDE 0.9 % IV SOLN: 250.0000 mL | INTRAVENOUS | Status: DC | PRN

## 2015-10-09 MED ORDER — ONDANSETRON HCL 4 MG/2ML IJ SOLN
INTRAMUSCULAR | Status: AC
  Filled 2015-10-09: qty 2

## 2015-10-09 MED ORDER — PROMETHAZINE HCL 25 MG/ML IJ SOLN
6.2500 mg | INTRAMUSCULAR | Status: DC | PRN
  Administered 2015-10-09: 12.5 mg via INTRAVENOUS

## 2015-10-09 MED ORDER — DEXAMETHASONE SODIUM PHOSPHATE 10 MG/ML IJ SOLN
INTRAMUSCULAR | Status: DC | PRN
  Administered 2015-10-09: 10 mg via INTRAVENOUS

## 2015-10-09 MED ORDER — LIDOCAINE HCL (CARDIAC) 20 MG/ML IV SOLN
INTRAVENOUS | Status: AC
  Filled 2015-10-09: qty 5

## 2015-10-09 MED ORDER — 0.9 % SODIUM CHLORIDE (POUR BTL) OPTIME
TOPICAL | Status: DC | PRN
  Administered 2015-10-09: 1000 mL

## 2015-10-09 MED ORDER — CLINDAMYCIN PHOSPHATE 900 MG/50ML IV SOLN
900.0000 mg | INTRAVENOUS | Status: AC
  Administered 2015-10-09: 900 mg via INTRAVENOUS

## 2015-10-09 MED ORDER — FUROSEMIDE 10 MG/ML IJ SOLN
INTRAMUSCULAR | Status: AC
  Administered 2015-10-09: 20 mg
  Filled 2015-10-09: qty 2

## 2015-10-09 MED ORDER — CLINDAMYCIN PHOSPHATE 900 MG/50ML IV SOLN
INTRAVENOUS | Status: AC
  Filled 2015-10-09: qty 50

## 2015-10-09 MED ORDER — EPHEDRINE SULFATE 50 MG/ML IJ SOLN
INTRAMUSCULAR | Status: DC | PRN
  Administered 2015-10-09: 10 mg via INTRAVENOUS

## 2015-10-09 MED ORDER — MIDAZOLAM HCL 5 MG/5ML IJ SOLN
INTRAMUSCULAR | Status: DC | PRN
  Administered 2015-10-09: 2 mg via INTRAVENOUS

## 2015-10-09 SURGICAL SUPPLY — 55 items
BAG SPEC RTRVL LRG 6X4 10 (ENDOMECHANICALS) ×2
CABLE HIGH FREQUENCY MONO STRZ (ELECTRODE) ×2 IMPLANT
CHLORAPREP W/TINT 26ML (MISCELLANEOUS) ×2 IMPLANT
CORDS BIPOLAR (ELECTRODE) ×2 IMPLANT
COVER SURGICAL LIGHT HANDLE (MISCELLANEOUS) ×1 IMPLANT
COVER TIP SHEARS 8 DVNC (MISCELLANEOUS) ×1 IMPLANT
COVER TIP SHEARS 8MM DA VINCI (MISCELLANEOUS) ×1
DRAPE SHEET LG 3/4 BI-LAMINATE (DRAPES) ×4 IMPLANT
DRAPE SURG IRRIG POUCH 19X23 (DRAPES) ×2 IMPLANT
DRAPE TABLE BACK 44X90 PK DISP (DRAPES) ×4 IMPLANT
DRAPE WARM FLUID 44X44 (DRAPE) ×2 IMPLANT
ELECT REM PT RETURN 9FT ADLT (ELECTROSURGICAL) ×2
ELECTRODE REM PT RTRN 9FT ADLT (ELECTROSURGICAL) ×1 IMPLANT
GLOVE BIO SURGEON STRL SZ 6 (GLOVE) ×8 IMPLANT
GLOVE BIO SURGEON STRL SZ 6.5 (GLOVE) ×4 IMPLANT
GOWN STRL REUS W/ TWL LRG LVL3 (GOWN DISPOSABLE) ×3 IMPLANT
GOWN STRL REUS W/TWL LRG LVL3 (GOWN DISPOSABLE) ×6
HOLDER FOLEY CATH W/STRAP (MISCELLANEOUS) ×2 IMPLANT
KIT ACCESSORY DA VINCI DISP (KITS) ×1
KIT ACCESSORY DVNC DISP (KITS) ×1 IMPLANT
KIT BASIN OR (CUSTOM PROCEDURE TRAY) ×2 IMPLANT
KIT PROCEDURE DA VINCI SI (MISCELLANEOUS)
KIT PROCEDURE DVNC SI (MISCELLANEOUS) IMPLANT
LIQUID BAND (GAUZE/BANDAGES/DRESSINGS) ×2 IMPLANT
MANIPULATOR UTERINE 4.5 ZUMI (MISCELLANEOUS) ×2 IMPLANT
NDL SAFETY ECLIPSE 18X1.5 (NEEDLE) IMPLANT
NDL SPNL 18GX3.5 QUINCKE PK (NEEDLE) IMPLANT
NEEDLE HYPO 18GX1.5 SHARP (NEEDLE)
NEEDLE SPNL 18GX3.5 QUINCKE PK (NEEDLE) IMPLANT
OCCLUDER COLPOPNEUMO (BALLOONS) ×2 IMPLANT
PEN SKIN MARKING BROAD (MISCELLANEOUS) ×2 IMPLANT
PORT ACCESS TROCAR AIRSEAL 12 (TROCAR) IMPLANT
PORT ACCESS TROCAR AIRSEAL 5M (TROCAR) ×1
POUCH SPECIMEN RETRIEVAL 10MM (ENDOMECHANICALS) ×2 IMPLANT
SET TRI-LUMEN FLTR TB AIRSEAL (TUBING) ×2 IMPLANT
SET TUBE IRRIG SUCTION NO TIP (IRRIGATION / IRRIGATOR) ×2 IMPLANT
SHEET LAVH (DRAPES) ×2 IMPLANT
SOLUTION ELECTROLUBE (MISCELLANEOUS) ×2 IMPLANT
SUT VIC AB 0 CT1 27 (SUTURE) ×2
SUT VIC AB 0 CT1 27XBRD ANTBC (SUTURE) ×1 IMPLANT
SUT VIC AB 4-0 PS2 27 (SUTURE) ×4 IMPLANT
SUT VICRYL 0 UR6 27IN ABS (SUTURE) ×1 IMPLANT
SYR 50ML LL SCALE MARK (SYRINGE) ×2 IMPLANT
SYRINGE 10CC LL (SYRINGE) IMPLANT
TOWEL OR 17X26 10 PK STRL BLUE (TOWEL DISPOSABLE) ×4 IMPLANT
TOWEL OR NON WOVEN STRL DISP B (DISPOSABLE) ×2 IMPLANT
TRAP SPECIMEN MUCOUS 40CC (MISCELLANEOUS) ×1 IMPLANT
TRAY FOLEY W/METER SILVER 14FR (SET/KITS/TRAYS/PACK) ×2 IMPLANT
TRAY LAPAROSCOPIC (CUSTOM PROCEDURE TRAY) ×2 IMPLANT
TROCAR 12M 150ML BLUNT (TROCAR) ×2 IMPLANT
TROCAR BLADELESS OPT 5 100 (ENDOMECHANICALS) ×2 IMPLANT
TROCAR DISP BLADELESS 8 DVNC (TROCAR) ×1 IMPLANT
TROCAR DISP BLADELESS 8MM (TROCAR) ×1
TROCAR PORT AIRSEAL 5X120 (TROCAR) IMPLANT
WATER STERILE IRR 1500ML POUR (IV SOLUTION) ×2 IMPLANT

## 2015-10-09 NOTE — Transfer of Care (Signed)
Immediate Anesthesia Transfer of Care Note  Patient: Meagan Ryan  Procedure(s) Performed: Procedure(s): ROBOTIC ASSISTED BILATERAL SALPINGO OOPHORECTOMY  (Bilateral)  Patient Location: PACU  Anesthesia Type:General  Level of Consciousness: awake, alert , oriented and patient cooperative  Airway & Oxygen Therapy: Patient Spontanous Breathing and Patient connected to face mask oxygen  Post-op Assessment: Report given to RN, Post -op Vital signs reviewed and stable and Patient moving all extremities X 4  Post vital signs: stable  Last Vitals:  Filed Vitals:   10/09/15 1304 10/09/15 1651  BP: 160/107   Pulse: 72 75  Temp: 36.5 C   Resp: 16 18    Complications: No apparent anesthesia complications

## 2015-10-09 NOTE — Progress Notes (Signed)
Dr Deirdre Priest aware pt has o2 sats in 70's.  Will give albuterol neb. Cont to monitor

## 2015-10-09 NOTE — Anesthesia Procedure Notes (Addendum)
Procedure Name: Intubation Date/Time: 10/09/2015 3:03 PM Performed by: Lissa Morales Pre-anesthesia Checklist: Patient identified, Emergency Drugs available, Suction available and Patient being monitored Patient Re-evaluated:Patient Re-evaluated prior to inductionOxygen Delivery Method: Circle System Utilized Preoxygenation: Pre-oxygenation with 100% oxygen Intubation Type: IV induction Ventilation: Mask ventilation without difficulty Laryngoscope Size: Mac and 3 Grade View: Grade IV Tube type: Oral Tube size: 7.5 mm Number of attempts: 2 Airway Equipment and Method: Stylet and Bougie stylet Placement Confirmation: ETT inserted through vocal cords under direct vision,  positive ETCO2 and breath sounds checked- equal and bilateral Secured at: 21 cm Tube secured with: Tape Dental Injury: Teeth and Oropharynx as per pre-operative assessment  Difficulty Due To: Difficult Airway- due to anterior larynx, Difficulty was anticipated and Difficult Airway- due to dentition Future Recommendations: Recommend- induction with short-acting agent, and alternative techniques readily available

## 2015-10-09 NOTE — Progress Notes (Signed)
Dr Denman George notified of continued low o2 sats, periods of apnea. Pt will be admitted overnight.

## 2015-10-09 NOTE — Progress Notes (Signed)
Dr Denman George aware of pt's o2 sats.  Lasix 20 mg ordered.  Will monitor.

## 2015-10-09 NOTE — Anesthesia Postprocedure Evaluation (Signed)
Anesthesia Post Note  Patient: Meagan Ryan  Procedure(s) Performed: Procedure(s) (LRB): ROBOTIC ASSISTED BILATERAL SALPINGO OOPHORECTOMY  (Bilateral)  Patient location during evaluation: PACU Anesthesia Type: General Level of consciousness: awake and alert Pain management: pain level controlled Vital Signs Assessment: post-procedure vital signs reviewed and stable Respiratory status: spontaneous breathing, nonlabored ventilation, respiratory function stable and patient connected to nasal cannula oxygen Cardiovascular status: blood pressure returned to baseline and stable Postop Assessment: no signs of nausea or vomiting Anesthetic complications: no    Last Vitals:  Filed Vitals:   10/09/15 2000 10/09/15 2029  BP: 99/86 130/86  Pulse: 98 96  Temp:  37.2 C  Resp: 12 12    Last Pain: There were no vitals filed for this visit.               Lorena Clearman J

## 2015-10-09 NOTE — Anesthesia Preprocedure Evaluation (Addendum)
Anesthesia Evaluation  Patient identified by MRN, date of birth, ID band Patient awake    Reviewed: Allergy & Precautions, NPO status , Patient's Chart, lab work & pertinent test results  History of Anesthesia Complications (+) PONV, Family history of anesthesia reaction and history of anesthetic complications  Airway Mallampati: II  TM Distance: >3 FB Neck ROM: Full    Dental no notable dental hx.    Pulmonary asthma ,    Pulmonary exam normal breath sounds clear to auscultation       Cardiovascular Exercise Tolerance: Good hypertension, Pt. on medications and Pt. on home beta blockers Normal cardiovascular exam+ dysrhythmias  Rhythm:Regular Rate:Normal  Recent cardiac testing shows decreased EF (45%)  Coronary CTA was normal. (09-04-15)  Nuclear med study: intermediate risk study.   Neuro/Psych negative neurological ROS  negative psych ROS   GI/Hepatic negative GI ROS, Neg liver ROS,   Endo/Other  Morbid obesity  Renal/GU negative Renal ROS  negative genitourinary   Musculoskeletal negative musculoskeletal ROS (+)   Abdominal (+) + obese,   Peds negative pediatric ROS (+)  Hematology negative hematology ROS (+)   Anesthesia Other Findings   Reproductive/Obstetrics negative OB ROS                            Anesthesia Physical Anesthesia Plan  ASA: III  Anesthesia Plan: General   Post-op Pain Management:    Induction: Intravenous  Airway Management Planned: Oral ETT  Additional Equipment:   Intra-op Plan:   Post-operative Plan: Extubation in OR  Informed Consent: I have reviewed the patients History and Physical, chart, labs and discussed the procedure including the risks, benefits and alternatives for the proposed anesthesia with the patient or authorized representative who has indicated his/her understanding and acceptance.   Dental advisory given  Plan Discussed  with: CRNA  Anesthesia Plan Comments:         Anesthesia Quick Evaluation

## 2015-10-09 NOTE — H&P (Signed)
Preop H&P Note: Gyn-Onc  Consult was intially requested by Dr. Crawford Givens for the evaluation of Meagan Ryan 59 y.o. female with two right ovarian cysts   CC:  Chief Complaint  Patient presents with  . right ovarian cyst    New Consult    Assessment/Plan:  Meagan Ryan is a 59 y.o. year old with 2, 2 cm right ovarian cysts in the setting of morbid obesity (BMI 40 kg being a squared), a history of dilated cardiomyopathy (ejection fraction 30-35%), and a personal history of breast cancer in 1996. She also has a history significant for multiple prior retroperitoneal and abdominal surgeries.  CA  125 is normal at 20. She certainly is at somewhat of an increased risk for ovarian cancer given her personal history of premenopausal breast cancer. I recommend genetic testing for this patient as she is at risk for harboring a BRCA mutation.  We carefully weighed out the pros and cons of options. One option would be for close surveillance with repeat imaging in 3 months time and repeat CA-125. This option would avoid surgical risks which are substantial in this patient with her history of cardiomyopathy, and multiple prior abdominal surgeries including a right nephrectomy. She declined this option.  I discussed that surgery was another option and this would involve a robotic assisted bilateral salpingo-oophorectomy, with hysterectomy and staging reserved only if frozen section reveals a malignancy in the right ovary. I discussed that this would be a definitive option, but carries with it the greatest risk.  After considering her options the patient is interested in definitive action with surgery. We reviewed a comprehensive the do not all inclusive list of potential surgical risks including bleeding, infection, damage to internal organs (such as bladder,ureters, bowels), blood clot, reoperation and rehospitalization. I discussed that Meagan Ryan these risks are increased probability  given her morbid obesity, prior abdominal surgeries. I also discussed that if she experiences a left ureteral injury this will render her with complete renal failure as she no longer has a right kidney. She voiced verbal understanding of these risks but still desires to pursue surgery. I discussed that conversion to laparotomy may be necessary if she has adhesive disease that is not amenable to minimally invasive approach.  She has had preop workup with her cardiologist, Dr Fransico Him who performed a calcium study with a score of 0. Her EF is 30-35%. We will minimize perioperative fluids.   HPI: Meagan Ryan is a 59 year old G46 P1 who is seen in consultation at the request of Dr. Charlesetta Garibaldi for right ovarian cysts in the setting of morbid obesity, multiple prior abdominal surgeries, dilated cardiomyopathy, and a personal history of premenopausal breast cancer at age 59. The patient reported some mild right sided pelvic pain to her doctor in September 2016. She is also had approximate 6 weeks of abdominal bloating. This prompted a transvaginal ultrasound scan which performed on 08/05/2015. It revealed a grossly normal appearing uterus with a right ovary containing 2 cystic structures one measuring 2.3 x 1.5 x 1.9 cm and the other measuring 2.2 x 2.2 x 1.9 cm. There was no ascites identified.  Since that time her right lower quadrant pain has dissipated however her abdominal bloating is persistent.  The patient has a personal history significant for the menopausal breast cancer at age 59 in 73. This was treated with mastectomy, TRAM flap reconstruction, and chemotherapy with Adriamycin. Following that treatment she has remained with no evidence of disease. However she  did develop dilated cardiomyopathy that was felt to be secondary to her chemotherapy exposure and her hypertension. Her last documented echocardiogram was in 2013 and revealed an ejection fraction of 45-50%. Her cardiologist is Dr. Golden Hurter  who last saw the patient in November 2015 and felt that she was doing well from the standpoint of her cardiomyopathy.  Following treatment of her breast cancer she was noted to have an atrophic right kidney of unclear etiology. She then underwent an open right nephrectomy. Subsequently in the early years of the 2000 she developed: Lithiasis and underwent a laparoscopic cholecystectomy. She reports that it were no problems with adhesive disease encounter that the surgery and was able to be accomplished laparoscopically.   Current Meds:  Outpatient Encounter Prescriptions as of 08/08/2015  Medication Sig  . ALPRAZolam (XANAX) 0.5 MG tablet Take 0.5 mg by mouth at bedtime as needed.  Marland Kitchen aspirin 81 MG tablet Take 81 mg by mouth daily.  Marland Kitchen azelastine (ASTELIN) 0.1 % nasal spray   . cetirizine (ZYRTEC) 10 MG tablet Take 10 mg by mouth daily.  . Cholecalciferol (VITAMIN D-3 PO) Take by mouth 2 (two) times daily.   . DULoxetine (CYMBALTA) 30 MG capsule Take 30 mg by mouth daily.  . fish oil-omega-3 fatty acids 1000 MG capsule Take 2 g by mouth 2 (two) times daily.   . fluticasone (FLONASE) 50 MCG/ACT nasal spray   . hydrochlorothiazide (HYDRODIURIL) 25 MG tablet Take 25 mg by mouth daily.  . meclizine (ANTIVERT) 12.5 MG tablet Take 12.5 mg by mouth 3 (three) times daily as needed for dizziness.  . niacin (NIASPAN) 500 MG CR tablet Take 500 mg by mouth every morning.   . olmesartan (BENICAR) 40 MG tablet Take 40 mg by mouth daily.   Marland Kitchen omeprazole (PRILOSEC) 10 MG capsule Take 10 mg by mouth daily.  . ondansetron (ZOFRAN ODT) 8 MG disintegrating tablet Take 1 tablet (8 mg total) by mouth every 8 (eight) hours as needed for nausea or vomiting.  . potassium chloride (K-DUR) 10 MEQ tablet Take 10 mEq by mouth daily.  . raloxifene (EVISTA) 60 MG tablet Take 60 mg by mouth daily.  . [DISCONTINUED] fluticasone (FLOVENT DISKUS) 50 MCG/BLIST diskus inhaler  Inhale 2 puffs into the lungs daily.  . [DISCONTINUED] omeprazole (PRILOSEC) 20 MG capsule   . [DISCONTINUED] oxyCODONE-acetaminophen (PERCOCET/ROXICET) 5-325 MG per tablet Take 1 tablet by mouth every 4 (four) hours as needed for severe pain. (Patient not taking: Reported on 01/03/2015)   No facility-administered encounter medications on file as of 08/08/2015.    Allergy:  Allergies  Allergen Reactions  . Norvasc [Amlodipine Besylate] Shortness Of Breath    Breathing difficulties  . Cephalexin Other (See Comments)    High temperature  . Codeine Nausea And Vomiting  . Noroxin [Norfloxacin]   . Sulfa Antibiotics Nausea And Vomiting  . Penicillins Rash    Social Hx:  Social History   Social History  . Marital Status: Widowed    Spouse Name: N/A  . Number of Children: N/A  . Years of Education: N/A   Occupational History  . Not on file.   Social History Main Topics  . Smoking status: Never Smoker   . Smokeless tobacco: Not on file  . Alcohol Use: No  . Drug Use: No  . Sexual Activity: Not on file   Other Topics Concern  . 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 Wears Ear plugs   Marital Status: Widowed   1 daughter    Past Surgical Hx:  Past Surgical History  Procedure Laterality Date  . Gastroc muscle repair  2007  . Mastectomy  06/1995    Left - for infiltrating ductal carcinoma  . Tubal ligation    . Nephrectomy Right late 1990s  . Laparoscopic cholecystectomy      Past Medical Hx:  Past Medical History  Diagnosis Date  . Pelvic pain in female   . Hypertension   . History of IBS   . History of hyperlipidemia   .  H/O allergic rhinitis   . H/O osteopenia 2009  . HX: breast cancer   . Tubular adenoma of colon   . H/O fibromyalgia   . H/O actinic keratosis   . Dysuria 2011  . PMB (postmenopausal bleeding) 2011  . H/O peripheral neuropathy   . Diabetes mellitus   . Hx of colonic polyps   . Hx of migraines   . Asthma   . H/O varicose veins   . Hx of ovarian cyst   . Pregnancy induced hypertension   . PMB (postmenopausal bleeding) 2011  . Hyperlipidemia   . Breast cancer 1996    stage II infiltrating ductal carcinoma, 3/8 axillary lymph nodes were positive for metastatic carcinoma, getting yearly mammogram and every other year MRI followed by oncologist Dr Benay Spice  . Allergic rhinitis   . Right-sided lacunar infarction     MRI 1.2012  . Fatty liver CT 1/12    elevated LFT  . Incomplete RBBB   . DCM (dilated cardiomyopathy)   . 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 Patient is postmenopausal.  Family Hx:  Family History  Problem Relation Age of Onset  . Heart disease Mother     MI  . Stroke Mother   . Diabetes Mother   . Hypertension Mother   . Cancer Father     Prostate  . Cancer Sister     Colon    Review of Systems:  Constitutional  Feels well,  ENT Normal appearing ears and nares bilaterally Skin/Breast  No rash, sores, jaundice, itching, dryness Cardiovascular  No chest pain, shortness of breath, or edema  Pulmonary  No cough or wheeze.  Gastro Intestinal  No nausea, vomitting, or diarrhoea. No bright red blood per rectum, no abdominal pain, change in bowel movement, or constipation.  Genito Urinary  No frequency, urgency, dysuria,  Musculo Skeletal  No myalgia, arthralgia, joint swelling or pain  Neurologic  No weakness, numbness, change  in gait,  Psychology  No depression, anxiety, insomnia.   Vitals: Blood pressure 119/63, pulse 116, temperature 98 F (36.7 C), temperature source Oral, resp. rate 18, height _0  (1.626 m), weight 232 lb 8 oz (105.461 kg), SpO2 98 %.  Physical Exam: WD in NAD Neck  Supple NROM, without any enlargements.  Lymph Node Survey No cervical supraclavicular or inguinal adenopathy Cardiovascular  Pulse normal rate, regularity and rhythm. S1 and S2 normal.  Lungs  Clear to auscultation bilateraly, without wheezes/crackles/rhonchi. Good air movement.  Skin  No rash/lesions/breakdown  Psychiatry  Alert and oriented to person, place, and time  Abdomen  Normoactive bowel sounds, abdomen soft,  non-tender and obese without evidence of hernia. Incisions in lower abdomen (transverse) - TRAM flap, periumbilcally - from TRAM flap, right subcostal and flank - from nephrectomy, and laparoscopic incisions. Back No CVA tenderness Genito Urinary  Vulva/vagina: Normal external female genitalia. No lesions. No discharge or bleeding. Bladder/urethra: No lesions or masses, well supported bladder Vagina: normal Cervix: Normal appearing, no lesions. Uterus: Small, mobile, no parametrial involvement or nodularity. Adnexa: no palpable masses. Rectal  Good tone, no masses no cul de sac nodularity.  Extremities  No bilateral cyanosis, clubbing or edema.   Donaciano Eva, MD

## 2015-10-09 NOTE — Progress Notes (Signed)
30 sec periods of apnea noted

## 2015-10-09 NOTE — Op Note (Signed)
OPERATIVE NOTE  Date: 10/09/15  Preoperative Diagnosis: bilateral ovarian cysts   Postoperative Diagnosis:  same  Procedure(s) Performed: Robotic-assisted laparoscopic bilateral salpingo-oophorectomy  Surgeon: Everitt Amber, M.D.  Assistant Surgeon: Lahoma Crocker M.D. (an MD assistant was necessary for tissue manipulation, management of robotic instrumentation, retraction and positioning due to the complexity of the case and hospital policies).   Anesthesia: Gen. endotracheal.  Specimens: Bilateral ovaries, fallopian tubes, pelvic washings  Estimated Blood Loss: <25 mL. Blood Replacement: None  Complications: none  Indication for Procedure:  Bilateral ovarian cysts, history of breast cancer  Operative Findings: bilateral ovaries that were grossly normal though the right was slightly enlarged with 70mm excrescences on surface of right ovary. Both ovaries showed benign ovarian cysts on frozen. There were significant bilateral pelvic varices in bilateral ovarian vessel ligaments.  Frozen pathology was consistent with benign ovarian cysts.  Procedure: The patient's taken to the operating room and placed under general endotracheal anesthesia testing difficulty. She is placed in a dorsolithotomy position and cervical acromial pad was placed. The arms were tucked with care taken to pad the olecranon process. And prepped and draped in usual sterile fashion. A uterine manipulator (zumi) was placed vaginally. A 23mm incision was made in the left upper quadrant palmer's point and a 5 mm Optiview trocar used to enter the abdomen under direct visualization. With entry into the abdomen and then maintenance of 15 mm of mercury the patient was placed in Trendelenburg position. An incision was made in the umbilicus and a 9mm trochar was placed through this site. Two incisions were made lateral to the umbilical incision in the left and right abdomen measuring 44mm. These incisions were made approximately 10  cm lateral to the umbilical incision. 8 mm robotic trochars were inserted. The robot was docked.  The abdomen was inspected as was the pelvis.  Sharp adhesiolysis freed omental adhesions to the umbilicus. Pelvic washings were obtained. An incision was made on the right pelvic side wall peritoneum parallel to the IP ligament and the retroperitoneal space entered. The right ureter was identified and the para-rectal space was developed. A window was created in the right broad ligament above the ureter. The right infundibulopelvic vessels were skeletonized cauterized and transected. The utero-ovarian ligaments similarly were cauterized and transected. Specimen was placed in an Endo Catch bag.  In a similar manner the left peritoneum and the side wall was incised, and the retroperitoneal space entered. The left ureter was identified and the left pararectal space was developed. The utero-ovarian ligament was skeletonized cauterized and transected. The left utero-ovarian ligaments were cauterized and transected in the left adnexa was placed in an Endo Catch bag.  The abdomen was copiously irrigated and drained and all operative sites inspected and hemostasis was assured  The robot was undocked. The camera was placed through the left upper abdominal incision. The contents of the left Endo Catch bag were first aspirated and then morcellated to facilitate removal from the abdominal cavity through the umbilical incision. In a similar fashion the contents of the right Endo Catch bag or morcellated to facilitate removal from the abdominal cavity.  The ports were all remove. The fascial closure at the umbilical incision and left upper quadrant port was made with 0 Vicryl.  All incisions were closed with a running subcuticular Monocryl suture. Dermabond was applied. Sponge, lap and needle counts were correct x 3.    The patient had sequential compression devices for VTE prophylaxis.  Disposition: PACU -stable          Condition: stable  Donaciano Eva, MD

## 2015-10-10 ENCOUNTER — Telehealth: Payer: Self-pay | Admitting: Cardiology

## 2015-10-10 ENCOUNTER — Ambulatory Visit (HOSPITAL_COMMUNITY)

## 2015-10-10 ENCOUNTER — Encounter (HOSPITAL_COMMUNITY): Payer: Self-pay | Admitting: Gynecologic Oncology

## 2015-10-10 DIAGNOSIS — D270 Benign neoplasm of right ovary: Secondary | ICD-10-CM | POA: Diagnosis not present

## 2015-10-10 DIAGNOSIS — N83209 Unspecified ovarian cyst, unspecified side: Secondary | ICD-10-CM | POA: Diagnosis present

## 2015-10-10 DIAGNOSIS — I429 Cardiomyopathy, unspecified: Secondary | ICD-10-CM | POA: Diagnosis not present

## 2015-10-10 DIAGNOSIS — I5023 Acute on chronic systolic (congestive) heart failure: Secondary | ICD-10-CM

## 2015-10-10 DIAGNOSIS — I519 Heart disease, unspecified: Secondary | ICD-10-CM

## 2015-10-10 LAB — CBC
HEMATOCRIT: 41 % (ref 36.0–46.0)
Hemoglobin: 13.3 g/dL (ref 12.0–15.0)
MCH: 30 pg (ref 26.0–34.0)
MCHC: 32.4 g/dL (ref 30.0–36.0)
MCV: 92.6 fL (ref 78.0–100.0)
Platelets: 218 10*3/uL (ref 150–400)
RBC: 4.43 MIL/uL (ref 3.87–5.11)
RDW: 14 % (ref 11.5–15.5)
WBC: 8.9 10*3/uL (ref 4.0–10.5)

## 2015-10-10 LAB — BASIC METABOLIC PANEL
Anion gap: 7 (ref 5–15)
BUN: 12 mg/dL (ref 6–20)
CALCIUM: 9.2 mg/dL (ref 8.9–10.3)
CO2: 29 mmol/L (ref 22–32)
CREATININE: 1.17 mg/dL — AB (ref 0.44–1.00)
Chloride: 102 mmol/L (ref 101–111)
GFR calc Af Amer: 57 mL/min — ABNORMAL LOW (ref >60)
GFR calc non Af Amer: 49 mL/min — ABNORMAL LOW (ref >60)
GLUCOSE: 135 mg/dL — AB (ref 65–99)
Potassium: 4.5 mmol/L (ref 3.5–5.1)
Sodium: 138 mmol/L (ref 135–145)

## 2015-10-10 LAB — TROPONIN I: Troponin I: <0.03 ng/mL (ref <0.031)

## 2015-10-10 LAB — BRAIN NATRIURETIC PEPTIDE: B Natriuretic Peptide: 151.5 pg/mL — ABNORMAL HIGH (ref 0.0–100.0)

## 2015-10-10 MED ORDER — NORTRIPTYLINE HCL 25 MG PO CAPS
25.0000 mg | ORAL_CAPSULE | Freq: Every evening | ORAL | Status: DC | PRN
  Filled 2015-10-10: qty 1

## 2015-10-10 MED ORDER — TRAMADOL HCL 50 MG PO TABS: 50.0000 mg | ORAL_TABLET | Freq: Four times a day (QID) | ORAL | Status: DC | PRN

## 2015-10-10 MED ORDER — ASPIRIN 81 MG PO CHEW
81.0000 mg | CHEWABLE_TABLET | Freq: Every morning | ORAL | Status: DC
  Administered 2015-10-10: 81 mg via ORAL
  Filled 2015-10-10: qty 1

## 2015-10-10 MED ORDER — IVABRADINE HCL 5 MG PO TABS
5.0000 mg | ORAL_TABLET | Freq: Two times a day (BID) | ORAL | Status: DC
  Filled 2015-10-10 (×2): qty 1

## 2015-10-10 MED ORDER — POTASSIUM CHLORIDE ER 10 MEQ PO TBCR
10.0000 meq | EXTENDED_RELEASE_TABLET | Freq: Every morning | ORAL | Status: DC
  Administered 2015-10-10: 10 meq via ORAL
  Filled 2015-10-10: qty 1

## 2015-10-10 MED ORDER — ONDANSETRON HCL 4 MG PO TABS: 4.0000 mg | ORAL_TABLET | Freq: Four times a day (QID) | ORAL | Status: DC | PRN

## 2015-10-10 MED ORDER — HYDROMORPHONE HCL 1 MG/ML IJ SOLN: 0.2000 mg | INTRAMUSCULAR | Status: DC | PRN

## 2015-10-10 MED ORDER — LORATADINE 10 MG PO TABS
10.0000 mg | ORAL_TABLET | Freq: Every day | ORAL | Status: DC
  Administered 2015-10-10: 10 mg via ORAL
  Filled 2015-10-10: qty 1

## 2015-10-10 MED ORDER — FLUTICASONE PROPIONATE 50 MCG/ACT NA SUSP
1.0000 | Freq: Every morning | NASAL | Status: DC
  Administered 2015-10-10: 1 via NASAL
  Filled 2015-10-10: qty 16

## 2015-10-10 MED ORDER — ALPRAZOLAM 0.25 MG PO TABS
0.2500 mg | ORAL_TABLET | Freq: Two times a day (BID) | ORAL | Status: DC
  Administered 2015-10-10: 0.25 mg via ORAL
  Filled 2015-10-10: qty 1

## 2015-10-10 MED ORDER — IVABRADINE HCL 5 MG PO TABS: 5.0000 mg | ORAL_TABLET | Freq: Two times a day (BID) | ORAL | Status: DC

## 2015-10-10 MED ORDER — OXYCODONE-ACETAMINOPHEN 5-325 MG PO TABS
1.0000 | ORAL_TABLET | ORAL | Status: DC | PRN
  Administered 2015-10-10: 1 via ORAL
  Filled 2015-10-10: qty 1

## 2015-10-10 MED ORDER — CARVEDILOL 3.125 MG PO TABS
3.1250 mg | ORAL_TABLET | Freq: Two times a day (BID) | ORAL | Status: DC
  Administered 2015-10-10: 3.125 mg via ORAL
  Filled 2015-10-10 (×3): qty 1

## 2015-10-10 MED ORDER — ONDANSETRON HCL 4 MG/2ML IJ SOLN: 4.0000 mg | Freq: Four times a day (QID) | INTRAMUSCULAR | Status: DC | PRN

## 2015-10-10 MED ORDER — NIACIN ER (ANTIHYPERLIPIDEMIC) 500 MG PO TBCR
500.0000 mg | EXTENDED_RELEASE_TABLET | Freq: Every morning | ORAL | Status: DC
  Administered 2015-10-10: 500 mg via ORAL
  Filled 2015-10-10: qty 1

## 2015-10-10 MED ORDER — DULOXETINE HCL 30 MG PO CPEP
30.0000 mg | ORAL_CAPSULE | Freq: Every day | ORAL | Status: DC
  Filled 2015-10-10 (×2): qty 1

## 2015-10-10 MED ORDER — PANTOPRAZOLE SODIUM 40 MG PO TBEC
40.0000 mg | DELAYED_RELEASE_TABLET | Freq: Every day | ORAL | Status: DC
  Administered 2015-10-10: 40 mg via ORAL
  Filled 2015-10-10: qty 1

## 2015-10-10 MED ORDER — FUROSEMIDE 20 MG PO TABS
20.0000 mg | ORAL_TABLET | Freq: Every day | ORAL | Status: DC
  Administered 2015-10-10: 20 mg via ORAL
  Filled 2015-10-10 (×2): qty 1

## 2015-10-10 MED ORDER — IBUPROFEN 800 MG PO TABS: 800.0000 mg | ORAL_TABLET | Freq: Three times a day (TID) | ORAL | Status: DC | PRN

## 2015-10-10 MED ORDER — MECLIZINE HCL 12.5 MG PO TABS
12.5000 mg | ORAL_TABLET | Freq: Three times a day (TID) | ORAL | Status: DC | PRN
  Filled 2015-10-10: qty 1

## 2015-10-10 MED ORDER — LOSARTAN POTASSIUM 25 MG PO TABS
25.0000 mg | ORAL_TABLET | Freq: Every day | ORAL | Status: DC
  Administered 2015-10-10: 25 mg via ORAL
  Filled 2015-10-10: qty 1

## 2015-10-10 NOTE — Discharge Summary (Signed)
Physician Discharge Summary  Patient ID: JURNEY REINING MRN: [id number] DOB/AGE: 12-01-1955 59 y.o.  Admit date: 10/09/2015 Discharge date: 10/10/2015  Admission Diagnoses: Bilateral ovarian cysts  Discharge Diagnoses:  Principal Problem:   Bilateral ovarian cysts Active Problems:   Morbid obesity with BMI of 40.0-44.9, adult (HCC)   S/P oophorectomy   Ovarian cyst   Discharged Condition:  The patient is in good condition and stable for discharge.    Hospital Course: On 10/09/2015, the patient underwent the following: Procedure(s): ROBOTIC ASSISTED BILATERAL SALPINGO OOPHORECTOMY .  The postoperative course included an overnight stay due to decreased O2 saturation in the 80s when O2 was removed.  Cardiology saw the patient prior to discharge.  Patient O2 saturation 93 on RA at discharge with no respiratory distress noted.  She was discharged to home on postoperative day 1 tolerating a regular diet, passing flatus, 93% on RA, voiding without difficulty.  She was advised by cardiology to take lasix 20 mg once daily along with Kdur (potassium) 10 meq daily for three days to prevent post-operative heart failure then return to regular as needed dosing per cardiology.  Continue taking losartan and carvedilol as well as daily weights and low salt diet. Also begin taking ivabradine 5 mg twice daily with the goal to lower heart rate without lowering blood pressure.  Follow up with cardiology next week.    Consults: cardiology  Significant Diagnostic Studies: None  Treatments: surgery: see above  Discharge Exam: Blood pressure 111/68, pulse 95, temperature 97.9 F (36.6 C), temperature source Oral, resp. rate 16, height 5\' 4"  (1.626 m), weight 233 lb (105.688 kg), SpO2 97 %. General appearance: alert, cooperative and no distress Resp: clear to auscultation bilaterally Cardio: regular rate and rhythm, S1, S2 normal, no murmur, click, rub or gallop GI: soft, non-tender; bowel sounds normal; no  masses,  no organomegaly Extremities: extremities normal, atraumatic, no cyanosis or edema Incision/Wound: Lap sites to the abdomen with dermabond without erythema or drainage 93 % on RA  Disposition: 01-Home or Self Care      Discharge Instructions    Call MD for:  difficulty breathing, headache or visual disturbances    Complete by:  As directed      Call MD for:  difficulty breathing, headache or visual disturbances    Complete by:  As directed      Call MD for:  extreme fatigue    Complete by:  As directed      Call MD for:  extreme fatigue    Complete by:  As directed      Call MD for:  hives    Complete by:  As directed      Call MD for:  persistant dizziness or light-headedness    Complete by:  As directed      Call MD for:  persistant dizziness or light-headedness    Complete by:  As directed      Call MD for:  persistant nausea and vomiting    Complete by:  As directed      Call MD for:  persistant nausea and vomiting    Complete by:  As directed      Call MD for:  redness, tenderness, or signs of infection (pain, swelling, redness, odor or green/yellow discharge around incision site)    Complete by:  As directed      Call MD for:  redness, tenderness, or signs of infection (pain, swelling, redness, odor or green/yellow discharge around incision site)  Complete by:  As directed      Call MD for:  severe uncontrolled pain    Complete by:  As directed      Call MD for:  severe uncontrolled pain    Complete by:  As directed      Call MD for:  temperature >100.4    Complete by:  As directed      Call MD for:  temperature >100.4    Complete by:  As directed      Diet - low sodium heart healthy    Complete by:  As directed      Diet general    Complete by:  As directed      Discharge instructions    Complete by:  As directed   Return to work: 2 weeks  Activity: 1. Be up and out of the bed during the day.  Take a nap if needed.  You may walk up steps but be careful  and use the hand rail.  Stair climbing will tire you more than you think, you may need to stop part way and rest.   2. No lifting or straining for 6 weeks.  3. No driving for 1-2 weeks.  Do Not drive if you are taking narcotic pain medicine.  4. Shower daily.  Use soap and water on your incision and pat dry; don't rub.   5. No sexual activity and nothing in the vagina for 4 weeks.  Diet: 1. Low sodium Heart Healthy Diet is recommended.  2. It is safe to use a laxative if you have difficulty moving your bowels.   Wound Care: 1. Keep clean and dry.  Shower daily.  Reasons to call the Doctor:  Fever - Oral temperature greater than 100.4 degrees Fahrenheit Foul-smelling vaginal discharge Difficulty urinating Nausea and vomiting Increased pain at the site of the incision that is unrelieved with pain medicine. Difficulty breathing with or without chest pain New calf pain especially if only on one side Sudden, continuing increased vaginal bleeding with or without clots.   Follow-up: 1. See Everitt Amber in 4 weeks.  Contacts: For questions or concerns you should contact:  Dr. Everitt Amber at [phone]     Driving Restrictions    Complete by:  As directed   No driving for 1 week.  Do not take narcotics and drive.     Increase activity slowly    Complete by:  As directed      Increase activity slowly    Complete by:  As directed      Lifting restrictions    Complete by:  As directed   No lifting greater than 10 lbs.     No dressing needed    Complete by:  As directed      Sexual Activity Restrictions    Complete by:  As directed   No sexual activity, nothing in the vagina, for 4-6 weeks.            Medication List    TAKE these medications        ALPRAZolam 0.5 MG tablet  Commonly known as:  XANAX  Take 0.25 mg by mouth 2 (two) times daily.     aspirin 81 MG tablet  Take 81 mg by mouth every morning.     azelastine 0.1 % nasal spray  Commonly known as:   ASTELIN  Place 1 spray into both nostrils 2 (two) times daily as needed for rhinitis or allergies.  carvedilol 3.125 MG tablet  Commonly known as:  COREG  TAKE 1 TABLET (3.125 MG TOTAL) BY MOUTH 2 (TWO) TIMES DAILY WITH A MEAL.     cetirizine 10 MG tablet  Commonly known as:  ZYRTEC  Take 10 mg by mouth every morning.     DULoxetine 30 MG capsule  Commonly known as:  CYMBALTA  Take 30 mg by mouth at bedtime.     Fish Oil 1200 MG Caps  Take 1,400 mg by mouth 2 (two) times daily.     fluticasone 50 MCG/ACT nasal spray  Commonly known as:  FLONASE  Place 1 spray into both nostrils every morning.     furosemide 20 MG tablet  Commonly known as:  LASIX  Take 1 tablet (20 mg total) by mouth as needed (for increase of 6 lbs of dry weight. Take with extra potassium supplement.).     ivabradine 5 MG Tabs tablet  Commonly known as:  CORLANOR  Take 1 tablet (5 mg total) by mouth 2 (two) times daily with a meal.     losartan 50 MG tablet  Commonly known as:  COZAAR  Take 1/2 tablet (25 mg) by mouth once daily     meclizine 12.5 MG tablet  Commonly known as:  ANTIVERT  Take 12.5 mg by mouth 3 (three) times daily as needed for dizziness.     niacin 500 MG CR tablet  Commonly known as:  NIASPAN  Take 500 mg by mouth every morning.     nortriptyline 25 MG capsule  Commonly known as:  PAMELOR  Take 25 mg by mouth at bedtime as needed for sleep. For sleep     omeprazole 10 MG capsule  Commonly known as:  PRILOSEC  Take 10 mg by mouth every morning.     potassium chloride 10 MEQ tablet  Commonly known as:  K-DUR  Take 10 mEq by mouth every morning.     raloxifene 60 MG tablet  Commonly known as:  EVISTA  Take 60 mg by mouth every morning.     traMADol 50 MG tablet  Commonly known as:  ULTRAM  Take 1 tablet (50 mg total) by mouth every 6 (six) hours as needed.     VITAMIN D-3 PO  Take 1 tablet by mouth 2 (two) times daily.       Follow-up Information    Follow up  with Eileen Stanford, PA-C On 10/15/2015.   Specialties:  Cardiology, Radiology   Why:  @ 2:30pm    Contact information:   [place] [id number] [phone]       Follow up with Donaciano Eva, MD On 10/30/2015.   Specialty:  Obstetrics and Gynecology   Why:  at 12:00pm at the [facility] for post-op follow up   Contact information:   [address] [place] [address] [phone]       Greater than thirty minutes were spend for face to face discharge instructions and discharge orders/summary in EPIC.   Signed: CROSS, MELISSA DEAL 10/10/2015, 10:43 AM

## 2015-10-10 NOTE — Telephone Encounter (Signed)
Per patient, ok to speak with her daughter. Called [facility] who states they have not received rx. Resent Corlanor rx to them.  Called and confirmed they received resent rx. Informed patient's dtr that they may now go and pick up new rx. She verbalized understanding and thanks me for helping.

## 2015-10-10 NOTE — Consult Note (Signed)
CARDIOLOGY CONSULT NOTE   Patient ID: Meagan Ryan MRN: [id number] DOB/AGE: 61-Feb-1957 61 y.o.  Admit date: 10/09/2015  Primary Physician   Gennette Pac, MD Primary Cardiologist  Dr. Radford Pax Reason for Consultation  CHF  HPI: Meagan Ryan is a 59 y.o. 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 She developed post op CHF and cardiology was consulted.   2D ECHO 08/13/2015 w/ LV EF: 30-35% and diffuse hypokinesis. She was recently diagnosed with a ovarian cysts. The patient elected for elective hysterectomy. However given recent complaints of exertional dyspnea, it was recommended that she undergo a nuclear stress test as part of her preoperative workup. This was an abnormal study. Given the fact that she has denied chest discomfort, Dr. Radford Pax recommended that she undergo further evaluation with a coronary CTA + calcium score prior to receiving surgical clearance. This was completed on 09/04/15 and calcium score returned at 0; she was cleared for surgery. She was continued on Losartan 25mg  po qd, Coreg 3.125mg  po BID and PRN lasix 20mg .  She presented to [facility] on 10/09/15 for planned robotic assisted bilateral salpingo-oophorectomy, with possible hysterectomy and staging reserved only if frozen section revealed a malignancy in the right ovary. Operative findings revealed that ovaries were grossly normal and frozen pathology was consistent with benign ovarian cysts. Post op she was noted to have 02 desaturations and given IV lasix 10mg . Cardiology was consutled for further assistance.   Weight today 233 lbs, which is actually 3 lbs down from office visit when she was 236 lbs. She denies CP, SOB, LE edema or orthopnea. She said she has been urinating quite a bit. Her only complaint is very mild pain at incision site. Otherwise she is  feeling well and ready to go home.      Past Medical History  Diagnosis Date  . Pelvic pain in female   . Hypertension   . History of IBS   . History of hyperlipidemia   . H/O allergic rhinitis   . H/O osteopenia 2009  . HX: breast cancer left 1996  . Tubular adenoma of colon   . H/O fibromyalgia   . H/O actinic keratosis   . Dysuria 2011  . PMB (postmenopausal bleeding) 2011  . H/O peripheral neuropathy   . Hx of colonic polyps   . Hx of migraines   . Asthma   . H/O varicose veins   . Hx of ovarian cyst   . Pregnancy induced hypertension   . PMB (postmenopausal bleeding) 2011  . Hyperlipidemia   . Allergic rhinitis   . Right-sided lacunar infarction ([place])     MRI 1.2012  . Fatty liver CT 1/12    elevated LFT  . Incomplete RBBB   . DCM (dilated cardiomyopathy) ([place])   . 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 Complication of anesthesia     WAKE UP WITH HEADACHE  . PONV (postoperative nausea and vomiting)   . Breast cancer ([place]) 1996    left breast-stage II infiltrating ductal carcinoma, 3/8 axillary lymph nodes were positive for metastatic carcinoma, getting yearly mammogram and every other year MRI followed by oncologist Dr Benay Spice  . Family history of adverse reaction to anesthesia     "family wakes up with personality changes, but are very short term"  Past Surgical History  Procedure Laterality Date  . Gastroc muscle repair  2007  . Mastectomy  06/1995    Left - for infiltrating ductal carcinoma  . Tubal ligation    . Nephrectomy Right late 1990s    due to kidney shrinkage  . Laparoscopic cholecystectomy    . Tram surgery  1996    Allergies  Allergen Reactions  . Norvasc [Amlodipine Besylate] Shortness Of Breath    Breathing difficulties  . Cephalexin Other (See Comments)    High temperature  . Codeine Nausea And Vomiting  . Noroxin [Norfloxacin]     Pt do not remember  . Sulfa Antibiotics Nausea And Vomiting  . Penicillins Rash    Has  patient had a PCN reaction causing immediate rash, facial/tongue/throat swelling, SOB or lightheadedness with hypotension:No Has patient had a PCN reaction causing severe rash involving mucus membranes or skin necrosis: Yes Has patient had a PCN reaction that required hospitalization No Has patient had a PCN reaction occurring within the last 10 years: No If all of the above answers are "NO", then may proceed with Cephalosporin use.     I have reviewed the patient's current medications . ALPRAZolam  0.25 mg Oral BID  . aspirin  81 mg Oral q morning - 10a  . carvedilol  3.125 mg Oral BID WC  . DULoxetine  30 mg Oral QHS  . fluticasone  1 spray Each Nare q morning - 10a  . loratadine  10 mg Oral Daily  . losartan  25 mg Oral Daily  . niacin  500 mg Oral q morning - 10a  . pantoprazole  40 mg Oral Daily  . potassium chloride  10 mEq Oral q morning - 10a     HYDROmorphone (DILAUDID) injection, ibuprofen, meclizine, nortriptyline, ondansetron **OR** ondansetron (ZOFRAN) IV, oxyCODONE-acetaminophen  Prior to Admission medications   Medication Sig Start Date End Date Taking? Authorizing Provider  ALPRAZolam Duanne Moron) 0.5 MG tablet Take 0.25 mg by mouth 2 (two) times daily.    Yes Historical Provider, MD  azelastine (ASTELIN) 0.1 % nasal spray Place 1 spray into both nostrils 2 (two) times daily as needed for rhinitis or allergies.  07/21/15  Yes Historical Provider, MD  carvedilol (COREG) 3.125 MG tablet TAKE 1 TABLET (3.125 MG TOTAL) BY MOUTH 2 (TWO) TIMES DAILY WITH A MEAL. 08/21/15  Yes Historical Provider, MD  cetirizine (ZYRTEC) 10 MG tablet Take 10 mg by mouth every morning.    Yes Historical Provider, MD  DULoxetine (CYMBALTA) 30 MG capsule Take 30 mg by mouth at bedtime.    Yes Historical Provider, MD  fluticasone (FLONASE) 50 MCG/ACT nasal spray Place 1 spray into both nostrils every morning.  06/26/15  Yes Historical Provider, MD  furosemide (LASIX) 20 MG tablet Take 1 tablet (20 mg  total) by mouth as needed (for increase of 6 lbs of dry weight. Take with extra potassium supplement.). Patient taking differently: Take 20 mg by mouth daily as needed for fluid (for increase of 6 lbs of dry weight. Take with extra potassium supplement.).  09/10/15  Yes Sueanne Margarita, MD  losartan (COZAAR) 50 MG tablet Take 1/2 tablet (25 mg) by mouth once daily Patient taking differently: Take 25 mg by mouth daily.  08/28/15  Yes Sueanne Margarita, MD  niacin (NIASPAN) 500 MG CR tablet Take 500 mg by mouth every morning.    Yes Historical Provider, MD  nortriptyline (PAMELOR) 25 MG capsule Take 25 mg by mouth at bedtime  as needed for sleep. For sleep   Yes Historical Provider, MD  Omega-3 Fatty Acids (FISH OIL) 1200 MG CAPS Take 1,400 mg by mouth 2 (two) times daily.   Yes Historical Provider, MD  omeprazole (PRILOSEC) 10 MG capsule Take 10 mg by mouth every morning.    Yes Historical Provider, MD  potassium chloride (K-DUR) 10 MEQ tablet Take 10 mEq by mouth every morning.    Yes Historical Provider, MD  raloxifene (EVISTA) 60 MG tablet Take 60 mg by mouth every morning.    Yes Historical Provider, MD  aspirin 81 MG tablet Take 81 mg by mouth every morning.     Historical Provider, MD  Cholecalciferol (VITAMIN D-3 PO) Take 1 tablet by mouth 2 (two) times daily.     Historical Provider, MD  meclizine (ANTIVERT) 12.5 MG tablet Take 12.5 mg by mouth 3 (three) times daily as needed for dizziness.    Historical Provider, MD  oxyCODONE-acetaminophen (PERCOCET) 10-325 MG tablet Take 1 tablet by mouth every 4 (four) hours as needed for pain. 10/09/15   Lahoma Crocker, MD     Social History   Social History  . Marital Status: Widowed    Spouse Name: N/A  . Number of Children: N/A  . Years of Education: N/A   Occupational History  . Not on file.   Social History Main Topics  . Smoking status: Never Smoker   . Smokeless tobacco: Never Used  . Alcohol Use: No  . Drug Use: No  . Sexual Activity:  Not on file   Other Topics Concern  . 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 Wears Ear plugs   Marital Status: Widowed   1 daughter    Family Status  Relation Status Death Age  . Mother Deceased 33    heart disease  . Father Deceased 66     stroke  . Sister Alive   . Sister Alive    Family History  Problem Relation Age of Onset  . Heart disease Mother     MI  . Stroke Mother   . Diabetes Mother   . Hypertension Mother   . Cancer Father     Prostate  . Stroke Father   . Cancer Sister     Colon     ROS:  Full 14 point review of systems complete and found to be negative unless listed above.  Physical Exam: Blood pressure 111/68, pulse 95, temperature 97.9 F (36.6 C), temperature source Oral, resp. rate 16, height 5\' 4"  (1.626 m), weight 233 lb (105.688 kg), SpO2 97 %.  General: Well developed, well nourished, female in no acute distress. Obese  Head: Eyes PERRLA, No xanthomas.   Normocephalic and atraumatic, oropharynx without edema or exudate.   Lungs: CTAB Heart: HRRR S1 S2, no rub/gallop, Heart regular rate and rhythm with S1, S2  murmur. pulses are 2+ extrem.   Neck: No carotid bruits. No lymphadenopathy. No JVD. Abdomen: Bowel sounds present, abdomen soft and non-tender without masses or hernias noted. Msk:  No spine or cva tenderness. No weakness, no joint deformities or effusions. Extremities: No clubbing or cyanosis. No edema.  Neuro: Alert and oriented X 3. No focal deficits noted. Psych:  Good affect, responds appropriately Skin: No rashes or  lesions noted.  Labs:   Lab Results  Component Value Date   WBC 8.9 10/10/2015   HGB 13.3 10/10/2015   HCT 41.0 10/10/2015   MCV 92.6 10/10/2015   PLT 218 10/10/2015   No results for input(s): INR in the last 72  hours.  Recent Labs Lab 10/10/15 0427  NA 138  K 4.5  CL 102  CO2 29  BUN 12  CREATININE 1.17*  CALCIUM 9.2  GLUCOSE 135*   No results found for: MG No results for input(s): CKTOTAL, CKMB, TROPONINI in the last 72 hours. No results for input(s): TROPIPOC in the last 72 hours. No results found for: PROBNP No results found for: CHOL, HDL, LDLCALC, TRIG No results found for: DDIMER No results found for: LIPASE, AMYLASE No results found for: TSH, T4TOTAL, T3FREE, THYROIDAB No results found for: VITAMINB12, FOLATE, FERRITIN, TIBC, IRON, RETICCTPCT  Echo:: 08/13/2015 LV EF: 30- 35% Study Conclusions - Left ventricle: The cavity size was normal. Wall thickness was normal. Systolic function was moderately to severely reduced. The estimated ejection fraction was in the range of 30% to 35%. Diffuse hypokinesis. Impressions: - Technically difficult; LV function appears to moderate to severely reduced; focal wall motion abnormality cannot be excluded; no significant valvular disease.   ECG:  No recent ECG  Radiology:  No results found.  ASSESSMENT AND PLAN:    Active Problems:   Morbid obesity with BMI of 40.0-44.9, adult ([place])   Bilateral ovarian cysts   S/P oophorectomy   Ovarian cyst  DALTON EALY is a 59 y.o. 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 She developed post op CHF and cardiology was consulted.   Dilated cardiomyopathy: 2/2 chemo from previous Breast CA -- EF is 30-35% by most recent ECHO (08/2015).  -- She takes Lasix 20 mg on PRN basis. She appears euvolemic today and CXR with no CHF, but we will have her take Lasix 20mg  po qd/Kdur 10 meQ po qd  x 3 days to avoid post op CHF and then return to her regular PRN dosing.  -- She will continue losartan 25 mg po qd and carvedilol 3.125 mg BID as well  as daily weights and low salt diet.  -- Additionally, it was noted that her resting HR is in the 80-90s with a relateively low BP. She would be a good candidate for ivarbradine 5mg  BID which will lower her HR without lowering BP: Goal HR 50-60 bpm.  -- I have arranged for follow up in the clinic with me next Wednesday  HTN- BPs normal to soft. Continue losartan 25 mg po qd and carvedilol 3.125 mg BID   Benign ovarian masses-s/p robotic assisted bilateral salpingo-oophorectomy on 10/09/15 w/ bilateral ovaries that were grossly normal; frozen pathology was consistent with benign ovarian cysts  Dispo- okay to be discharged from a cardiac standpoint. She will see me in clinic next week  Signed: Crista Luria 10/10/2015 9:06 AM  Pager [phone]  Co-Sign MD  The patient was seen, examined and discussed with Nell Range, PA-C and I agree with the above.   MORANDA RIGONI is a 59 y.o. 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 She developed post op CHF and cardiology  was consulted.  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 We were called for hypoxia post surgery. She is currently asymptomatic, denies SOB, orthopnea, PND. Her weight is stable. This was most probably precipitated by fluid given iv during surgery.  We will give her Lasix 40 mg po daily x 3 days and follow up in the clinic the next week. We will also add corlanor (ivabradine) 5 mg po daily as she is persistently tachycardic and her BP is borderline to increase carvedilol.   Dorothy Spark 10/10/2015

## 2015-10-10 NOTE — Progress Notes (Signed)
Patient had some bleeding coming through her umbilicus port site.  Covered sited with guaze.  Will report to oncoming nurse.

## 2015-10-10 NOTE — Discharge Instructions (Addendum)
Take your lasix 20 mg once daily along with Kdur (potassium) 10 meq daily for three days to prevent post-operative heart failure then return to your regular as needed dosing per cardiology.  Continue taking losartan and carvedilol as well as daily weights and low salt diet. Also begin taking ivabradine 5 mg twice daily with the goal to lower your heart rate without lowering your blood pressure.  Follow up with cardiology next week or sooner if needed.  Bilateral Salpingo-Oophorectomy, Care After Refer to this sheet in the next few weeks. These instructions provide you with information on caring for yourself after your procedure. Your health care provider may also give you more specific instructions. Your treatment has been planned according to current medical practices, but problems sometimes occur. Call your health care provider if you have any problems or questions after your procedure. WHAT TO EXPECT AFTER THE PROCEDURE After your procedure, it is typical to have the following:   Abdominal pain that can be controlled with medicine.  Vaginal spotting.  Constipation.  Menopausal symptoms such as hot flashes, vaginal dryness, and mood swings. HOME CARE INSTRUCTIONS   Get plenty of rest and sleep.  Only take over-the-counter or prescription medicines as directed by your health care provider. Do not take aspirin. It can cause bleeding.  Keep incision areas clean and dry. Remove or change bandages (dressings) only as directed by your health care provider.  Take showers instead of baths for a few weeks as directed by your health care provider.  Limit exercise and activities as directed by your health care provider. Do not lift anything heavier than 5 pounds (2.3 kg) until your health care provider approves.  Do not drive until your health care provider approves.  Follow your health care provider's advice regarding diet. You may be able to resume your usual diet right away.  Drink enough  fluids to keep your urine clear or pale yellow.  Do not douche, use tampons, or have sexual intercourse for 6 weeks after the procedure.  Do not drink alcohol until your health care provider says it is okay.  Take your temperature twice a day and write it down.  If you become constipated, you may:  Ask your health care provider about taking a mild laxative.  Add more fruit and bran to your diet.  Drink more fluids.  Follow up with your health care provider as directed. SEEK MEDICAL CARE IF:   You have swelling, redness, or increasing pain in the incision area.  You see pus coming from the incision area.  You notice a bad smell coming from the wound or dressing.  You have pain, redness, or swelling where the IV access tube was placed.  Your incision is breaking open (the edges are not staying together).  You feel dizzy or feel like fainting.  You develop pain or bleeding when you urinate.  You develop diarrhea.  You develop nausea and vomiting.  You develop abnormal vaginal discharge.  You develop a rash.  You have pain that is not controlled with medicine. SEEK IMMEDIATE MEDICAL CARE IF:   You develop a fever.  You develop abdominal pain.  You have chest pain.  You develop shortness of breath.  You pass out.  You develop pain, swelling, or redness in your leg.  You develop heavy vaginal bleeding with or without blood clots.   This information is not intended to replace advice given to you by your health care provider. Make sure you discuss any questions  you have with your health care provider.   Document Released: 10/25/2005 Document Revised: 06/27/2013 Document Reviewed: 04/18/2013 Elsevier Interactive Patient Education 2016 [place] Anesthesia, Adult, Care After Refer to this sheet in the next few weeks. These instructions provide you with information on caring for yourself after your procedure. Your health care provider may also  give you more specific instructions. Your treatment has been planned according to current medical practices, but problems sometimes occur. Call your health care provider if you have any problems or questions after your procedure. WHAT TO EXPECT AFTER THE PROCEDURE After the procedure, it is typical to experience:  Sleepiness.  Nausea and vomiting. HOME CARE INSTRUCTIONS  For the first 24 hours after general anesthesia:  Have a responsible person with you.  Do not drive a car. If you are alone, do not take public transportation.  Do not drink alcohol.  Do not take medicine that has not been prescribed by your health care provider.  Do not sign important papers or make important decisions.  You may resume a normal diet and activities as directed by your health care provider.  Change bandages (dressings) as directed.  If you have questions or problems that seem related to general anesthesia, call the hospital and ask for the anesthetist or anesthesiologist on call. SEEK MEDICAL CARE IF:  You have nausea and vomiting that continue the day after anesthesia.  You develop a rash. SEEK IMMEDIATE MEDICAL CARE IF:   You have difficulty breathing.  You have chest pain.  You have any allergic problems.   This information is not intended to replace advice given to you by your health care provider. Make sure you discuss any questions you have with your health care provider.   Document Released: 01/31/2001 Document Revised: 11/15/2014 Document Reviewed: 02/23/2012 Elsevier Interactive Patient Education 2016 [employer]  Tramadol tablets What is this medicine? TRAMADOL (TRA ma dole) is a pain reliever. It is used to treat moderate to severe pain in adults. This medicine may be used for other purposes; ask your health care provider or pharmacist if you have questions. What should I tell my health care provider before I take this medicine? They need to know if you have any of these  conditions: -brain tumor -depression -drug abuse or addiction -head injury -if you frequently drink alcohol containing drinks -kidney disease or trouble passing urine -liver disease -lung disease, asthma, or breathing problems -seizures or epilepsy -suicidal thoughts, plans, or attempt; a previous suicide attempt by you or a family member -an unusual or allergic reaction to tramadol, codeine, other medicines, foods, dyes, or preservatives -pregnant or trying to get pregnant -breast-feeding How should I use this medicine? Take this medicine by mouth with a full glass of water. Follow the directions on the prescription label. If the medicine upsets your stomach, take it with food or milk. Do not take more medicine than you are told to take. Talk to your pediatrician regarding the use of this medicine in children. Special care may be needed. Overdosage: If you think you have taken too much of this medicine contact a poison control center or emergency room at once. NOTE: This medicine is only for you. Do not share this medicine with others. What if I miss a dose? If you miss a dose, take it as soon as you can. If it is almost time for your next dose, take only that dose. Do not take double or extra doses. What may interact with  this medicine? Do not take this medicine with any of the following medications: -MAOIs like Carbex, Eldepryl, Marplan, Nardil, and Parnate This medicine may also interact with the following medications: -alcohol or medicines that contain alcohol -antihistamines -benzodiazepines -bupropion -carbamazepine or oxcarbazepine -clozapine -cyclobenzaprine -digoxin -furazolidone -linezolid -medicines for depression, anxiety, or psychotic disturbances -medicines for migraine headache like almotriptan, eletriptan, frovatriptan, naratriptan, rizatriptan, sumatriptan, zolmitriptan -medicines for pain like pentazocine, buprenorphine, butorphanol, meperidine, nalbuphine, and  propoxyphene -medicines for sleep -muscle relaxants -naltrexone -phenobarbital -phenothiazines like perphenazine, thioridazine, chlorpromazine, mesoridazine, fluphenazine, prochlorperazine, promazine, and trifluoperazine -procarbazine -warfarin This list may not describe all possible interactions. Give your health care provider a list of all the medicines, herbs, non-prescription drugs, or dietary supplements you use. Also tell them if you smoke, drink alcohol, or use illegal drugs. Some items may interact with your medicine. What should I watch for while using this medicine? Tell your doctor or health care professional if your pain does not go away, if it gets worse, or if you have new or a different type of pain. You may develop tolerance to the medicine. Tolerance means that you will need a higher dose of the medicine for pain relief. Tolerance is normal and is expected if you take this medicine for a long time. Do not suddenly stop taking your medicine because you may develop a severe reaction. Your body becomes used to the medicine. This does NOT mean you are addicted. Addiction is a behavior related to getting and using a drug for a non-medical reason. If you have pain, you have a medical reason to take pain medicine. Your doctor will tell you how much medicine to take. If your doctor wants you to stop the medicine, the dose will be slowly lowered over time to avoid any side effects. You may get drowsy or dizzy. Do not drive, use machinery, or do anything that needs mental alertness until you know how this medicine affects you. Do not stand or sit up quickly, especially if you are an older patient. This reduces the risk of dizzy or fainting spells. Alcohol can increase or decrease the effects of this medicine. Avoid alcoholic drinks. You may have constipation. Try to have a bowel movement at least every 2 to 3 days. If you do not have a bowel movement for 3 days, call your doctor or health care  professional. Your mouth may get dry. Chewing sugarless gum or sucking hard candy, and drinking plenty of water may help. Contact your doctor if the problem does not go away or is severe. What side effects may I notice from receiving this medicine? Side effects that you should report to your doctor or health care professional as soon as possible: -allergic reactions like skin rash, itching or hives, swelling of the face, lips, or tongue -breathing difficulties, wheezing -confusion -itching -light headedness or fainting spells -redness, blistering, peeling or loosening of the skin, including inside the mouth -seizures Side effects that usually do not require medical attention (report to your doctor or health care professional if they continue or are bothersome): -constipation -dizziness -drowsiness -headache -nausea, vomiting This list may not describe all possible side effects. Call your doctor for medical advice about side effects. You may report side effects to FDA at 1-800-FDA-1088. Where should I keep my medicine? Keep out of the reach of children. This medicine may cause accidental overdose and death if it taken by other adults, children, or pets. Mix any unused medicine with a substance like cat litter or coffee  grounds. Then throw the medicine away in a sealed container like a sealed bag or a coffee can with a lid. Do not use the medicine after the expiration date. Store at room temperature between 15 and 30 degrees C (59 and 86 degrees F). NOTE: This sheet is a summary. It may not cover all possible information. If you have questions about this medicine, talk to your doctor, pharmacist, or health care provider.    2016, Elsevier/[employer] Standard. (2013-12-21 15:42:09)  Ivabradine tablets What is this medicine? IVABRADINE (eye VAB ra deen) is used to reduce the risk of going to the hospital due to worsening heart failure. This medicine may be used for other purposes; ask your health  care provider or pharmacist if you have questions. What should I tell my health care provider before I take this medicine? They need to know if you have any of these conditions: -certain heart conditions like sick sinus syndrome, sinoatrial block, or third degree atrioventricular block -heart failure that has recently worsened -liver disease -low blood pressure (less than 90/50 mmHg) -low resting heart rate (less than 60 beats per minute) -pacemaker -an unusual or allergic reaction to ivabradine, other medicines, foods, dyes, or preservatives -pregnant or trying to get pregnant -breast-feeding How should I use this medicine? Take this medicine by mouth with a glass of water. Follow the directions on the prescription label. Avoid grapefruit juice. Take your medicine at regular intervals. Do not take it more often than directed. Do not stop taking except on your doctor's advice. Talk to your pediatrician regarding the use of this medicine in children. Special care may be needed. Overdosage: If you think you have taken too much of this medicine contact a poison control center or emergency room at once. NOTE: This medicine is only for you. Do not share this medicine with others. What if I miss a dose? If you miss a dose, take it as soon as you can. If it is almost time for your next dose, take only that dose. Do not take double or extra doses. What may interact with this medicine? Do not take this medicine with any of the following medications: -certain medicines for fungal infections like ketoconazole and itraconazole -certain antibiotics called macrolide antibiotics like clarithromycin and telithromycin -certain medicines for HIV disease called protease inhibitors like nelfinavir -nefazodone This medicine may also interact with the following medications: -amiodarone -beta-blockers like metoprolol and propranolol -calcium channel blockers like diltiazem and verapamil -certain medicines for  seizures like barbiturates and phenytoin -digoxin -grapefruit juice -rifampicin -St. John's wort This list may not describe all possible interactions. Give your health care provider a list of all the medicines, herbs, non-prescription drugs, or dietary supplements you use. Also tell them if you smoke, drink alcohol, or use illegal drugs. Some items may interact with your medicine. What should I watch for while using this medicine? Tell your doctor or health care professional if your symptoms do not start to get better or if they get worse. Tell your doctor or health care professional right away if you have any changes in your eyesight. Do not become pregnant while taking this medicine. Women who are able to get pregnant must use birth control while taking this medicine. Women should inform their doctor if they wish to become pregnant or think they might be pregnant. There is a potential for serious side effects to an unborn child. Talk to your health care professional or pharmacist for more information. What side effects may I notice  from receiving this medicine? Side effects that you should report to your doctor or health care professional as soon as possible: -allergic reactions like skin rash, itching or hives, swelling of the face, lips, or tongue -high blood pressure -signs and symptoms of a dangerous change in heartbeat or heart rhythm like chest pain; dizziness; fast or irregular heartbeat; palpitations; feeling faint or lightheaded; breathing problems -unusually slow heartbeat Side effects that usually do not require medical attention (Report these to your doctor or health care professional if they continue or are bothersome.): -changes in vision This list may not describe all possible side effects. Call your doctor for medical advice about side effects. You may report side effects to FDA at 1-800-FDA-1088. Where should I keep my medicine? Keep out of the reach of children. Store at room  temperature between 20 and 25 degrees C (68 and 77 degrees F). Throw away any unused medicine after the expiration date. NOTE: This sheet is a summary. It may not cover all possible information. If you have questions about this medicine, talk to your doctor, pharmacist, or health care provider.    2016, Elsevier/[employer] Standard. (2014-02-27 15:21:41)

## 2015-10-10 NOTE — Telephone Encounter (Signed)
[facility] Message  Pt daughter calling- stated that the new Rx of Ivabradine started at the hospital and given in her d/c reports taday 12/2, was not sent to her pharmacy in [place]. Please call back and discuss.

## 2015-10-14 ENCOUNTER — Telehealth: Payer: Self-pay

## 2015-10-14 NOTE — Progress Notes (Signed)
Cardiology Office Note   Date:  10/15/2015   ID:  Meagan Ryan, DOB January 11, 1956, MRN [id number]  PCP:  Gennette Pac, MD  Cardiologist:  Dr. Radford Pax   Post hospital follow up    History of Present Illness: Meagan Ryan is a 59 y.o. 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   She presented to [facility] on 10/09/15 for planned robotic assisted bilateral salpingo-oophorectomy. She developed post op CHF and cardiology was consulted.   2D ECHO 08/13/2015 w/ LV EF: 30-35% and diffuse hypokinesis. She was recently diagnosed with a ovarian cysts. The patient elected for elective hysterectomy. However given recent complaints of exertional dyspnea, it was recommended that she undergo a nuclear stress test as part of her preoperative workup. This was an abnormal study. Given the fact that she has denied chest discomfort, Dr. Radford Pax recommended that she undergo further evaluation with a coronary CTA + calcium score prior to receiving surgical clearance. This was completed on 09/04/15 and calcium score returned at 0; she was cleared for surgery. She was continued on Losartan 25mg  po qd, Coreg 3.125mg  po BID and PRN lasix 20mg .  She presented to [facility] on 10/09/15 for planned robotic assisted bilateral salpingo-oophorectomy, with possible hysterectomy and staging reserved only if frozen section revealed a malignancy in the right ovary. Operative findings revealed that ovaries were grossly normal and frozen pathology was consistent with benign ovarian cysts. Post op she was noted to have 02 desaturations and given IV lasix 10mg . Cardiology was consutled for further assistance. She actually looked euvolemic and weight stable. She was noted to be tachycardic with soft BP and started on Irvarbradine for CHF. She also was given a short course of lasix daily to avoid post op CHF (Lasix 20mg  po qd/Kdur  10 meQ po qd x 3 days to avoid post op CHF and then return to her regular PRN dosing).  Today she presents to clinic for follow up. No CP, LE edema, orthopnea or PND. She has chronic SOB with activity that may be a little worse than usual. She tried to pick up ivarbradine 5mg  BID but it cost over $500 and she couldn't aftord it. She was told by RN at the hospital that she has sleep apnea. She does snore and has issues with fatigue.     Past Medical History  Diagnosis Date  . Pelvic pain in female   . Hypertension   . History of IBS   . History of hyperlipidemia   . H/O allergic rhinitis   . H/O osteopenia 2009  . HX: breast cancer left 1996  . Tubular adenoma of colon   . H/O fibromyalgia   . H/O actinic keratosis   . Dysuria 2011  . PMB (postmenopausal bleeding) 2011  . H/O peripheral neuropathy   . Hx of colonic polyps   . Hx of migraines   . Asthma   . H/O varicose veins   . Hx of ovarian cyst   . Pregnancy induced hypertension   . PMB (postmenopausal bleeding) 2011  . Hyperlipidemia   . Allergic rhinitis   . Right-sided lacunar infarction ([place])     MRI 1.2012  . Fatty liver CT 1/12    elevated LFT  . Incomplete RBBB   . DCM (dilated cardiomyopathy) ([place])   . 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  Complication of anesthesia     WAKE UP WITH HEADACHE  . PONV (postoperative nausea and vomiting)   . Breast cancer ([place]) 1996    left breast-stage II infiltrating ductal carcinoma, 3/8 axillary lymph nodes were positive for metastatic carcinoma, getting yearly mammogram and every other year MRI followed by oncologist Dr Benay Spice  . Family history of adverse reaction to anesthesia     "family wakes up with personality changes, but are very short term"    Past Surgical History  Procedure Laterality Date  . Gastroc muscle repair  2007  . Mastectomy  06/1995    Left - for infiltrating ductal carcinoma  . Tubal ligation    . Nephrectomy Right late 1990s    due to  kidney shrinkage  . Laparoscopic cholecystectomy    . Tram surgery  1996  . Robotic assisted total hysterectomy with bilateral salpingo oopherectomy Bilateral 10/09/2015    Procedure: ROBOTIC ASSISTED BILATERAL SALPINGO OOPHORECTOMY ;  Surgeon: Everitt Amber, MD;  Location: [facility];  Service: Gynecology;  Laterality: Bilateral;     Current Outpatient Prescriptions  Medication Sig Dispense Refill  . ALPRAZolam (XANAX) 0.5 MG tablet Take 0.25 mg by mouth 2 (two) times daily.     Marland Kitchen aspirin 81 MG tablet Take 81 mg by mouth every morning.     Marland Kitchen azelastine (ASTELIN) 0.1 % nasal spray Place 1 spray into both nostrils 2 (two) times daily as needed for rhinitis or allergies.     . carvedilol (COREG) 3.125 MG tablet TAKE 1 TABLET (3.125 MG TOTAL) BY MOUTH 2 (TWO) TIMES DAILY WITH A MEAL. 180 tablet 3  . cetirizine (ZYRTEC) 10 MG tablet Take 10 mg by mouth every morning.     . Cholecalciferol (VITAMIN D-3 PO) Take 1 tablet by mouth 2 (two) times daily.     . DULoxetine (CYMBALTA) 30 MG capsule Take 30 mg by mouth at bedtime.     . fluticasone (FLONASE) 50 MCG/ACT nasal spray Place 1 spray into both nostrils every morning.     . furosemide (LASIX) 20 MG tablet Take 20 mg by mouth. 20 mg, Oral, As needed, for increase of 6 lbs of dry weight. Take with extra potassium supplement    . ivabradine (CORLANOR) 5 MG TABS tablet Take 1 tablet (5 mg total) by mouth 2 (two) times daily with a meal. 60 tablet 3  . losartan (COZAAR) 50 MG tablet Take 0.5 tablets (25 mg total) by mouth daily. 90 tablet 3  . meclizine (ANTIVERT) 12.5 MG tablet Take 12.5 mg by mouth 3 (three) times daily as needed for dizziness.    . niacin (NIASPAN) 500 MG CR tablet Take 500 mg by mouth every morning.     . nortriptyline (PAMELOR) 25 MG capsule Take 25 mg by mouth at bedtime as needed for sleep. For sleep    . Omega-3 Fatty Acids (FISH OIL) 1200 MG CAPS Take 1,400 mg by mouth 2 (two) times daily.    Marland Kitchen omeprazole (PRILOSEC) 10 MG capsule  Take 10 mg by mouth every morning.     . potassium chloride (K-DUR) 10 MEQ tablet Take 10 mEq by mouth every morning.     . raloxifene (EVISTA) 60 MG tablet Take 60 mg by mouth every morning.     . traMADol (ULTRAM) 50 MG tablet Take 1 tablet (50 mg total) by mouth every 6 (six) hours as needed. 15 tablet 0   No current facility-administered medications for this visit.    Allergies:  Amlodipine; Norvasc; Cephalexin; Codeine; Gluten meal; Noroxin; Sulfa antibiotics; Sulfamethoxazole; Latex; and Penicillins    Social History:  The patient  reports that she has never smoked. She has never used smokeless tobacco. She reports that she does not drink alcohol or use illicit drugs.   Family History:  The patient's family history includes Cancer in her father and sister; Diabetes in her mother; Heart disease in her mother; Hypertension in her mother; Stroke in her father and mother.    ROS:  Please see the history of present illness.   Otherwise, review of systems are positive for none.   All other systems are reviewed and negative.    PHYSICAL EXAM: VS:  BP 130/82 mmHg  Pulse 86  Ht 5\' 4"  (1.626 m)  Wt 228 lb (103.42 kg)  BMI 39.12 kg/m2 , BMI Body mass index is 39.12 kg/(m^2). GEN: Well nourished, well developed, in no acute distressobese HEENT: normal Neck: no JVD, carotid bruits, or masses Cardiac: RRR; no murmurs, rubs, or gallops,no edema  Respiratory:  clear to auscultation bilaterally, normal work of breathing GI: soft, nontender, nondistended, + BS MS: no deformity or atrophy Skin: warm and dry, no rash Neuro:  Strength and sensation are intact Psych: euthymic mood, full affect   EKG:  EKG is ordered today. The ekg ordered today demonstrates NSR LAD, iLBBB   Recent Labs: 09/30/2015: ALT 31 10/10/2015: B Natriuretic Peptide 151.5*; BUN 12; Creatinine, Ser 1.17*; Hemoglobin 13.3; Platelets 218; Potassium 4.5; Sodium 138    Lipid Panel No results found for: CHOL, TRIG,  HDL, CHOLHDL, VLDL, LDLCALC, LDLDIRECT    Wt Readings from Last 3 Encounters:  10/15/15 228 lb (103.42 kg)  10/09/15 233 lb (105.688 kg)  09/30/15 233 lb (105.688 kg)      Other studies Reviewed: Additional studies/ records that were reviewed today include: 2D ECHO Review of the above records demonstrates:    Echo:: 08/13/2015 LV EF: 30- 35% Study Conclusions - Left ventricle: The cavity size was normal. Wall thickness was normal. Systolic function was moderately to severely reduced. The estimated ejection fraction was in the range of 30% to 35%. Diffuse hypokinesis. Impressions: - Technically difficult; LV function appears to moderate to severely reduced; focal wall motion abnormality cannot be excluded; no significant valvular disease.  ASSESSMENT AND PLAN:  Meagan Ryan is a 59 y.o. 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   Dilated cardiomyopathy: 2/2 chemo from previous Breast CA -- EF is 30-35% by most recent ECHO (08/2015).  -- She takes Lasix 20 mg on PRN basis. She appears euvolemic today  -- She will continue losartan 25 mg po qd and carvedilol 3.125 mg BID as well as daily weights and low salt diet.  -- We tried to start her on ivarbradine 5mg  BID but it cost over $500 and she couldn't aftord it. I provided her with a Corlanor savings card to see if this would work. HR goal 50-60 bpm   HTN- BPs normal to soft. Today 130/82 today. Continue losartan 25 mg po qd and carvedilol 3.125 mg BID  Benign ovarian masses-s/p robotic assisted bilateral salpingo-oophorectomy on 10/09/15 w/ bilateral ovaries that were grossly normal; frozen pathology was consistent with benign ovarian cysts  Possible OSA- she snores at night and has chronic fatigue. RN during surgery told patient that she witnessed apneic episodes when patient was sleeping. Will have  her set up for a sleep study.    Current medicines are reviewed at length with the patient today.  The patient does not have concerns regarding medicines.  The following changes have been made:  no change  Labs/ tests ordered today include:   Orders Placed This Encounter  Procedures  . EKG 12-Lead  . Split night study     Disposition:   FU with Dr. Radford Pax in 3 months.   Renea Ee  10/15/2015 3:08 PM    [facility] [address], [place], [place]  [address] Phone: [phone]; Fax: [phone]

## 2015-10-14 NOTE — Telephone Encounter (Signed)
Prior auth for Corlanor 5mg  sent to [facility].

## 2015-10-15 ENCOUNTER — Encounter: Payer: Self-pay | Admitting: Physician Assistant

## 2015-10-15 ENCOUNTER — Other Ambulatory Visit: Payer: Self-pay | Admitting: Physician Assistant

## 2015-10-15 ENCOUNTER — Ambulatory Visit (INDEPENDENT_AMBULATORY_CARE_PROVIDER_SITE_OTHER): Admitting: Physician Assistant

## 2015-10-15 VITALS — BP 130/82 | HR 86 | Ht 64.0 in | Wt 228.0 lb

## 2015-10-15 DIAGNOSIS — I10 Essential (primary) hypertension: Secondary | ICD-10-CM | POA: Diagnosis not present

## 2015-10-15 DIAGNOSIS — R0683 Snoring: Secondary | ICD-10-CM | POA: Diagnosis not present

## 2015-10-15 MED ORDER — LOSARTAN POTASSIUM 50 MG PO TABS: 25.0000 mg | ORAL_TABLET | Freq: Every day | ORAL | Status: DC

## 2015-10-15 MED ORDER — IVABRADINE HCL 5 MG PO TABS: 5.0000 mg | ORAL_TABLET | Freq: Two times a day (BID) | ORAL | Status: DC

## 2015-10-15 MED ORDER — CARVEDILOL 3.125 MG PO TABS: ORAL_TABLET | ORAL | Status: DC

## 2015-10-15 NOTE — Patient Instructions (Signed)
Medication Instructions:  Your physician recommends that you continue on your current medications as directed. Please refer to the Current Medication list given to you today.   Labwork: None ordered  Testing/Procedures: Your physician has recommended that you have a sleep study. This test records several body functions during sleep, including: brain activity, eye movement, oxygen and carbon dioxide blood levels, heart rate and rhythm, breathing rate and rhythm, the flow of air through your mouth and nose, snoring, body muscle movements, and chest and belly movement.    Follow-Up: Your physician recommends that you schedule a follow-up appointment in: 3 MONTHS WITH DR. Radford Pax   Any Other Special Instructions Will Be Listed Below (If Applicable).   If you need a refill on your cardiac medications before your next appointment, please call your pharmacy.

## 2015-10-16 ENCOUNTER — Telehealth: Payer: Self-pay | Admitting: Pharmacist

## 2015-10-16 ENCOUNTER — Other Ambulatory Visit: Payer: Self-pay | Admitting: Pharmacist

## 2015-10-16 NOTE — Telephone Encounter (Signed)
Completed PA for Corlanor - PA was approved and patient's copay will be $24/month. [place] to confirm price and called patient to let her know medication was ready for pick up. Patient expressed thanks and verbalized understanding.

## 2015-10-17 ENCOUNTER — Telehealth: Payer: Self-pay

## 2015-10-17 NOTE — Telephone Encounter (Signed)
Corlanor approved by [employer]. Patient aware.

## 2015-10-30 ENCOUNTER — Ambulatory Visit: Attending: Gynecologic Oncology | Admitting: Gynecologic Oncology

## 2015-10-30 ENCOUNTER — Encounter: Payer: Self-pay | Admitting: Gynecologic Oncology

## 2015-10-30 VITALS — BP 146/75 | HR 60 | Temp 97.7°F | Wt 233.4 lb

## 2015-10-30 DIAGNOSIS — Z853 Personal history of malignant neoplasm of breast: Secondary | ICD-10-CM

## 2015-10-30 NOTE — Progress Notes (Signed)
  POSTOPERATIVE FOLLOWUP  HPI:  Meagan Ryan is a 59 y.o. year old G1P1001 initially seen in consultation on 08/08/15 for bilateral ovarian cysts.  She then underwent a robotic lysis of adhesions and BSO on [id number] without complications.  Her postoperative course was complicated by exacerbation of CHF requiring overnight hospital observation and administration of [place] O2.  Her final pathology revealed benign ovarian cysts bilaterally.  She is seen today for a postoperative check and to discuss her pathology results and ongoing plan.  Since discharge from the hospital, she is feeling well.  She has improving appetite, normal bowel and bladder function, and pain controlled with minimal PO medication. She has no other complaints today. Her cardiac meds have been altered to overcome progression of her CHF.  She was unable to attend her prior appointment with genetics due to illness in the family but desires a repeat visit scheduled.   Review of systems: Constitutional:  She has no weight gain or weight loss. She has no fever or chills. Eyes: No blurred vision Ears, Nose, Mouth, Throat: No dizziness, headaches or changes in hearing. No mouth sores. Cardiovascular: No chest pain, palpitations or edema. Respiratory:  No shortness of breath, wheezing or cough Gastrointestinal: She has normal bowel movements without diarrhea or constipation. She denies any nausea or vomiting. She denies blood in her stool or heart burn. Genitourinary:  She denies pelvic pain, pelvic pressure or changes in her urinary function. She has no hematuria, dysuria, or incontinence. She has no irregular vaginal bleeding or vaginal discharge Musculoskeletal: Denies muscle weakness or joint pains.  Skin:  She has no skin changes, rashes or itching Neurological:  Denies dizziness or headaches. No neuropathy, no numbness or tingling. Psychiatric:  She denies depression or anxiety. Hematologic/Lymphatic:   No easy bruising or  bleeding   Physical Exam: Blood pressure 146/75, pulse 60, temperature 97.7 F (36.5 C), temperature source Oral, weight 233 lb 6 oz (105.858 kg). General: Well dressed, well nourished in no apparent distress.   HEENT:  Normocephalic and atraumatic, no lesions.  Extraocular muscles intact. Sclerae anicteric. Pupils equal, round, reactive. No mouth sores or ulcers. Thyroid is normal size, not nodular, midline. Abdomen:  Soft, nontender, nondistended.  No palpable masses.  No hepatosplenomegaly.  No ascites. Normal bowel sounds.  No hernias.  Incisions are well healed Genitourinary: deferred Extremities: No cyanosis, clubbing or edema.  No calf tenderness or erythema. No palpable cords. Psychiatric: Mood and affect are appropriate. Neurological: Awake, alert and oriented x 3. Sensation is intact, no neuropathy.  Musculoskeletal: No pain, normal strength and range of motion.  Assessment:    59 y.o. year old with benign bilateral ovarian cysts and CHF with a hx of breast cancer.   S/p robotic BSO on 10/09/15.   Plan: 1) Pathology reports reviewed today 2) Treatment counseling - she requires no treatment for her pathology. We have scheduled her with a genetics counselor at her request. She was given the opportunity to ask questions, which were answered to her satisfaction, and she is agreement with the above mentioned plan of care.  3)  Return to clinic on a prn basis only  Donaciano Eva, MD

## 2015-10-30 NOTE — Patient Instructions (Signed)
You will receive a phone call from the [facility] for an appointment with Genetics.  Please call for any questions or concerns.

## 2015-10-31 ENCOUNTER — Telehealth: Payer: Self-pay | Admitting: Genetic Counselor

## 2015-10-31 NOTE — Telephone Encounter (Signed)
Genetic appt-s/w patient and gave genetic appt for 01/18 @ 9 w/Karen Florene Glen

## 2015-11-04 ENCOUNTER — Telehealth: Payer: Self-pay | Admitting: Cardiology

## 2015-11-04 NOTE — Telephone Encounter (Signed)
Pt was started on losartan and Coreg at the beginning of December.  Corlanor samples were given on 12/7.  Coreg has the highest incidence of insomnia at 2%.  More likely to be related to her sleep apnea. Spoke with pt.  She has not been taking the nortiptyline that is on her medication list for sleep.  Suggested she try that to see if it would help.  Would not want to stop her beta blocker or ACE-I at this time given EF.  She is agreeable to this plan and will call back with any issues.

## 2015-11-04 NOTE — Telephone Encounter (Signed)
No precert is required with insurance for sleep study. Patient has been scheduled for 12/30/2015.  Patient is aware of this date and that a packet of information is being mailed to her.   I let her know that she could call me with any questions that she may have along the way.

## 2015-11-04 NOTE — Telephone Encounter (Signed)
New Message  Pt c/o medication issue:  1. Name of Medication:   1. Losartan 2.ivabradine (CORLANOR) 5 MG TABS tablet 3. carvedilol (COREG) 3.125 MG tablet  3. Are you having a reaction (difficulty breathing--STAT)? Pt states that one of these new medications are causing her insomnia and she is not sure which one it is. Would like a call back to discuss. Pt states that she was supposed to be scheduled for a sleep study however she has not heard anything about it. Please call back to disuss

## 2015-11-04 NOTE — Telephone Encounter (Signed)
Patient st for the last couple weeks she has been unable to sleep (get to sleep or stay asleep) and she blames one of her cardiac meds - Losartan, Corlanor, or Coreg. She is unsure which one.  She is having no other symptoms. Informed patient Dr. Radford Pax is out of the office today, but she and the pharmacist will be consulted for recommendations.   Reminded patient pre-cert must be completed before sleep studies are scheduled, so Bethany, CMA will call to follow-up on sleep study.

## 2015-11-11 ENCOUNTER — Other Ambulatory Visit: Payer: Self-pay

## 2015-11-11 NOTE — Telephone Encounter (Signed)
Meagan Ryan, RPH at 10/16/2015 8:37 AM     Status: Signed       Expand All Collapse All   Completed PA for Corlanor - PA was approved and patient's copay will be $24/month. [place] to confirm price and called patient to let her know medication was ready for pick up. Patient expressed thanks and verbalized understanding.

## 2015-11-13 ENCOUNTER — Encounter: Payer: Self-pay | Admitting: *Deleted

## 2015-11-13 ENCOUNTER — Emergency Department (INDEPENDENT_AMBULATORY_CARE_PROVIDER_SITE_OTHER)
Admission: EM | Admit: 2015-11-13 | Discharge: 2015-11-13 | Disposition: A | Discharge status: Home / Self Care | Source: Home / Self Care | Attending: Family Medicine | Admitting: Family Medicine

## 2015-11-13 DIAGNOSIS — Z8669 Personal history of other diseases of the nervous system and sense organs: Secondary | ICD-10-CM | POA: Diagnosis not present

## 2015-11-13 DIAGNOSIS — H9202 Otalgia, left ear: Secondary | ICD-10-CM

## 2015-11-13 MED ORDER — PREDNISONE 20 MG PO TABS: ORAL_TABLET | ORAL | Status: DC

## 2015-11-13 MED ORDER — DOXYCYCLINE HYCLATE 100 MG PO CAPS: 100.0000 mg | ORAL_CAPSULE | Freq: Two times a day (BID) | ORAL | Status: DC

## 2015-11-13 NOTE — ED Provider Notes (Signed)
CSN: [id number]     Arrival date & time 11/13/15  1157 History   First MD Initiated Contact with Patient 11/13/15 1212     Chief Complaint  Patient presents with  . Otalgia   (Consider location/radiation/quality/duration/timing/severity/associated sxs/prior Treatment) HPI  Pt is a 60yo female presenting to [facility] with c/o Left ear pain that started about 3 days ago.  Pain is aching and sore, 2/10 at this time. Pain was initially on the Left back side of her head but now in her Left ear.  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  Denies headaches, nausea, or dizziness. Denies congestion, sore throat or fevers.  She does have meclizine at home she uses when her Meniere's flares up but has not felt like she needed it recently.   Past Medical History  Diagnosis Date  . Pelvic pain in female   . Hypertension   . History of IBS   . History of hyperlipidemia   . H/O allergic rhinitis   . H/O osteopenia 2009  . HX: breast cancer left 1996  . Tubular adenoma of colon   . H/O fibromyalgia   . H/O actinic keratosis   . Dysuria 2011  . PMB (postmenopausal bleeding) 2011  . H/O peripheral neuropathy   . Hx of colonic polyps   . Hx of migraines   . Asthma   . H/O varicose veins   . Hx of ovarian cyst   . Pregnancy induced hypertension   . PMB (postmenopausal bleeding) 2011  . Hyperlipidemia   . Allergic rhinitis   . Right-sided lacunar infarction ([place])     MRI 1.2012  . Fatty liver CT 1/12    elevated LFT  . Incomplete RBBB   . DCM (dilated cardiomyopathy) ([place])   . 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 Complication of anesthesia     WAKE UP WITH HEADACHE  . PONV (postoperative nausea and vomiting)   . Breast cancer ([place]) 1996    left breast-stage II infiltrating ductal carcinoma, 3/8 axillary lymph nodes were positive for metastatic carcinoma, getting yearly mammogram and every other year MRI followed by oncologist Dr Benay Spice   . Family history of adverse reaction to anesthesia     "family wakes up with personality changes, but are very short term"  . Meniere disease    Past Surgical History  Procedure Laterality Date  . Gastroc muscle repair  2007  . Mastectomy  06/1995    Left - for infiltrating ductal carcinoma  . Tubal ligation    . Nephrectomy Right late 1990s    due to kidney shrinkage  . Laparoscopic cholecystectomy    . Tram surgery  1996  . Robotic assisted total hysterectomy with bilateral salpingo oopherectomy Bilateral 10/09/2015    Procedure: ROBOTIC ASSISTED BILATERAL SALPINGO OOPHORECTOMY ;  Surgeon: Everitt Amber, MD;  Location: [facility];  Service: Gynecology;  Laterality: Bilateral;   Family History  Problem Relation Age of Onset  . Heart disease Mother     MI  . Stroke Mother   . Diabetes Mother   . Hypertension Mother   . Cancer Father     Prostate  . Stroke Father   . Cancer Sister     Colon   Social History  Substance Use Topics  . Smoking status: Never Smoker   . Smokeless tobacco: Never Used  . Alcohol Use: No  OB History    Gravida Para Term Preterm AB TAB SAB Ectopic Multiple Living   1 1 1       1      Review of Systems  Constitutional: Negative for fever and chills.  HENT: Positive for ear pain (Left). Negative for congestion and sore throat.   Respiratory: Negative for cough and shortness of breath.   Gastrointestinal: Negative for nausea, vomiting, abdominal pain and diarrhea.  Musculoskeletal: Negative for myalgias and arthralgias.  Neurological: Negative for dizziness, light-headedness and headaches.    Allergies  Amlodipine; Norvasc; Cephalexin; Codeine; Gluten meal; Noroxin; Sulfa antibiotics; Sulfamethoxazole; Latex; and Penicillins  Home Medications   Prior to Admission medications   Medication Sig Start Date End Date Taking? Authorizing Provider  ALPRAZolam Duanne Moron) 0.5 MG tablet Take 0.25 mg by mouth 2 (two) times daily.     Historical Provider, MD   aspirin 81 MG tablet Take 81 mg by mouth every morning.     Historical Provider, MD  azelastine (ASTELIN) 0.1 % nasal spray Place 1 spray into both nostrils 2 (two) times daily as needed for rhinitis or allergies.  07/21/15   Historical Provider, MD  carvedilol (COREG) 3.125 MG tablet TAKE 1 TABLET (3.125 MG TOTAL) BY MOUTH 2 (TWO) TIMES DAILY WITH A MEAL. 10/15/15   Eileen Stanford, PA-C  cetirizine (ZYRTEC) 10 MG tablet Take 10 mg by mouth every morning.     Historical Provider, MD  Cholecalciferol (VITAMIN D-3 PO) Take 1 tablet by mouth 2 (two) times daily. Reported on 10/30/2015    Historical Provider, MD  doxycycline (VIBRAMYCIN) 100 MG capsule Take 1 capsule (100 mg total) by mouth 2 (two) times daily. One po bid x 7 days 11/13/15   Noland Fordyce, PA-C  DULoxetine (CYMBALTA) 30 MG capsule Take 30 mg by mouth at bedtime.     Historical Provider, MD  fluticasone (FLONASE) 50 MCG/ACT nasal spray Place 1 spray into both nostrils every morning.  06/26/15   Historical Provider, MD  furosemide (LASIX) 20 MG tablet Take 20 mg by mouth. 20 mg, Oral, As needed, for increase of 6 lbs of dry weight. Take with extra potassium supplement    Historical Provider, MD  ivabradine (CORLANOR) 5 MG TABS tablet Take 1 tablet (5 mg total) by mouth 2 (two) times daily with a meal. 10/15/15   Eileen Stanford, PA-C  losartan (COZAAR) 50 MG tablet Take 0.5 tablets (25 mg total) by mouth daily. 10/15/15   Eileen Stanford, PA-C  meclizine (ANTIVERT) 12.5 MG tablet Take 12.5 mg by mouth 3 (three) times daily as needed for dizziness.    Historical Provider, MD  niacin (NIASPAN) 500 MG CR tablet Take 500 mg by mouth every morning.     Historical Provider, MD  nortriptyline (PAMELOR) 25 MG capsule Take 25 mg by mouth at bedtime as needed for sleep. For sleep    Historical Provider, MD  Omega-3 Fatty Acids (FISH OIL) 1200 MG CAPS Take 1,400 mg by mouth 2 (two) times daily.    Historical Provider, MD  omeprazole (PRILOSEC) 10  MG capsule Take 10 mg by mouth every morning.     Historical Provider, MD  potassium chloride (K-DUR) 10 MEQ tablet Take 10 mEq by mouth every morning.     Historical Provider, MD  predniSONE (DELTASONE) 20 MG tablet 3 tabs po day one, then 2 po daily x 4 days 11/13/15   Noland Fordyce, PA-C  raloxifene (EVISTA) 60 MG tablet Take 60 mg by  mouth every morning.     Historical Provider, MD  traMADol (ULTRAM) 50 MG tablet Take 1 tablet (50 mg total) by mouth every 6 (six) hours as needed. 10/10/15   Dorothyann Gibbs, NP  Vitamin D, Ergocalciferol, (DRISDOL) 50000 UNITS CAPS capsule  10/24/15   Historical Provider, MD   Meds Ordered and Administered this Visit  Medications - No data to display  BP 154/92 mmHg  Pulse 68  Temp(Src) 97.9 F (36.6 C) (Oral)  Resp 16  Ht 5\' 4"  (1.626 m)  Wt 232 lb (105.235 kg)  BMI 39.80 kg/m2  SpO2 97% No data found.   Physical Exam  Constitutional: She is oriented to person, place, and time. She appears well-developed and well-nourished. No distress.  Pt sitting comfortably on exam table, NAD.   HENT:  Head: Normocephalic and atraumatic.  Right Ear: Hearing, tympanic membrane, external ear and ear canal normal.  Left Ear: Hearing and ear canal normal. No drainage, swelling or tenderness. There is mastoid tenderness ( mild). Tympanic membrane is scarred. Tympanic membrane is not erythematous.  No middle ear effusion.  Left ear: scarring of TM. Mild tenderness from posterior occipital bone to mastoid. No edema, erythema or warmth.   Eyes: Conjunctivae are normal. No scleral icterus.  Neck: Normal range of motion. Neck supple.  Cardiovascular: Normal rate, regular rhythm and normal heart sounds.   Pulmonary/Chest: Effort normal and breath sounds normal. No stridor. No respiratory distress. She has no wheezes. She has no rales. She exhibits no tenderness.  Abdominal: Soft. Bowel sounds are normal. She exhibits no distension and no mass. There is no tenderness. There  is no rebound and no guarding.  Musculoskeletal: Normal range of motion.  Lymphadenopathy:    She has no cervical adenopathy.  Neurological: She is alert and oriented to person, place, and time. Coordination and gait normal.  Skin: Skin is warm and dry. No rash noted. She is not diaphoretic. No erythema.  Nursing note and vitals reviewed.   ED Course  Procedures (including critical care time)  Labs Review Labs Reviewed - No data to display  Imaging Review No results found.  Tympanometry: normal in Left and Right ear    MDM   1. Left ear pain   2. History of Meniere's disease    Pt is a 60yo female c/o Left ear pain that has hearing loss secondary to surgeries for Meniere's disease.  She does have mild tenderness along posterior occipital bone to mastoid bone, however, no edema, erythema or warmth. Pt appears well, non-toxic. Is afebrile. No hx of fevers since onset of symptoms.  Pt denies dizziness, headaches, nausea or vomiting.  Tympanometry: normal.  Low concern for mastoiditis. Will hold off on CT scan at this time.  No evidence of AOM or otitis externa at this time.  Will tx symptomatically with Prednisone for 5 days. May take her meclizine as needed.   Prescription for Doxycycline given if symptoms continue to worsen including worsening pain or fever.  F/u with PCP in 5-7 days if not improving, sooner if worsening.    Noland Fordyce, PA-C 11/13/15 1526

## 2015-11-13 NOTE — ED Notes (Signed)
Pt c/o 3 days of ear pain that started on the left back side of her head and now in her ear. No drainage.

## 2015-11-13 NOTE — Discharge Instructions (Signed)
Please try symptomatic treatment with acetaminophen, ibuprofen and prednisone.  You may take meclizine as needed for any dizziness or nausea that may develop.  If symptoms continue to worsen, including worsening pain or fever, please fill the antibiotic or follow up with your primary care provider for further evaluation and treatment.

## 2015-11-17 ENCOUNTER — Other Ambulatory Visit (HOSPITAL_COMMUNITY)

## 2015-11-18 ENCOUNTER — Other Ambulatory Visit: Payer: Self-pay | Admitting: Cardiology

## 2015-11-18 MED ORDER — IVABRADINE HCL 5 MG PO TABS: 5.0000 mg | ORAL_TABLET | Freq: Two times a day (BID) | ORAL | Status: DC

## 2015-11-20 ENCOUNTER — Ambulatory Visit: Admitting: Cardiology

## 2015-11-25 ENCOUNTER — Other Ambulatory Visit (HOSPITAL_COMMUNITY)

## 2015-11-26 ENCOUNTER — Other Ambulatory Visit

## 2015-11-26 ENCOUNTER — Encounter: Admitting: Genetic Counselor

## 2015-11-26 ENCOUNTER — Ambulatory Visit: Admitting: Cardiology

## 2015-11-28 ENCOUNTER — Other Ambulatory Visit: Payer: Self-pay | Admitting: Otolaryngology

## 2015-11-28 ENCOUNTER — Ambulatory Visit
Admission: RE | Admit: 2015-11-28 | Discharge: 2015-11-28 | Disposition: A | Discharge status: Home / Self Care | Source: Ambulatory Visit | Attending: Otolaryngology | Admitting: Otolaryngology

## 2015-11-28 DIAGNOSIS — H9202 Otalgia, left ear: Secondary | ICD-10-CM

## 2015-12-10 ENCOUNTER — Other Ambulatory Visit: Payer: Self-pay

## 2015-12-10 DIAGNOSIS — Z1231 Encounter for screening mammogram for malignant neoplasm of breast: Secondary | ICD-10-CM

## 2015-12-25 ENCOUNTER — Ambulatory Visit
Admission: RE | Admit: 2015-12-25 | Discharge: 2015-12-25 | Disposition: A | Discharge status: Home / Self Care | Source: Ambulatory Visit

## 2015-12-25 DIAGNOSIS — Z1231 Encounter for screening mammogram for malignant neoplasm of breast: Secondary | ICD-10-CM

## 2015-12-29 ENCOUNTER — Other Ambulatory Visit: Payer: Self-pay | Admitting: Family Medicine

## 2015-12-29 DIAGNOSIS — R928 Other abnormal and inconclusive findings on diagnostic imaging of breast: Secondary | ICD-10-CM

## 2015-12-30 ENCOUNTER — Ambulatory Visit (HOSPITAL_BASED_OUTPATIENT_CLINIC_OR_DEPARTMENT_OTHER): Attending: Physician Assistant | Admitting: Radiology

## 2015-12-30 VITALS — Ht 64.0 in | Wt 234.0 lb

## 2015-12-30 DIAGNOSIS — Z79899 Other long term (current) drug therapy: Secondary | ICD-10-CM | POA: Diagnosis not present

## 2015-12-30 DIAGNOSIS — I10 Essential (primary) hypertension: Secondary | ICD-10-CM | POA: Diagnosis not present

## 2015-12-30 DIAGNOSIS — R0683 Snoring: Secondary | ICD-10-CM | POA: Diagnosis not present

## 2015-12-30 DIAGNOSIS — R5383 Other fatigue: Secondary | ICD-10-CM | POA: Diagnosis not present

## 2015-12-30 DIAGNOSIS — G4733 Obstructive sleep apnea (adult) (pediatric): Secondary | ICD-10-CM | POA: Diagnosis present

## 2015-12-30 DIAGNOSIS — I493 Ventricular premature depolarization: Secondary | ICD-10-CM | POA: Diagnosis not present

## 2015-12-30 DIAGNOSIS — Z6841 Body Mass Index (BMI) 40.0 and over, adult: Secondary | ICD-10-CM | POA: Diagnosis not present

## 2015-12-30 DIAGNOSIS — E669 Obesity, unspecified: Secondary | ICD-10-CM | POA: Diagnosis not present

## 2015-12-31 ENCOUNTER — Telehealth: Payer: Self-pay | Admitting: Cardiology

## 2015-12-31 ENCOUNTER — Encounter (HOSPITAL_BASED_OUTPATIENT_CLINIC_OR_DEPARTMENT_OTHER): Payer: Self-pay | Admitting: Radiology

## 2015-12-31 DIAGNOSIS — G4733 Obstructive sleep apnea (adult) (pediatric): Secondary | ICD-10-CM

## 2015-12-31 HISTORY — DX: Obstructive sleep apnea (adult) (pediatric): G47.33

## 2015-12-31 NOTE — Telephone Encounter (Signed)
Very mild OSA - please set up OV to discuss options

## 2015-12-31 NOTE — Sleep Study (Signed)
   Patient Name: Meagan Ryan, Meagan Ryan MRN: [id number] Study Date: 12/30/2015 Gender: Female D.O.B: [birth year] Age (years): 54 Referring Provider: Eileen Stanford Interpreting Physician: Fransico Him MD, ABSM RPSGT: Laren Everts  Weight (lbs): 234 BMI: 40 Height (inches): 64 Neck Size: 15.50  CLINICAL INFORMATION Sleep Study Type: NPSG Indication for sleep study: Fatigue, Hypertension, Obesity, OSA, Snoring, Witnessed Apneas Epworth Sleepiness Score: 3  SLEEP STUDY TECHNIQUE As per the AASM Manual for the Scoring of Sleep and Associated Events v2.3 (April 2016) with a hypopnea requiring 4% desaturations. The channels recorded and monitored were frontal, central and occipital EEG, electrooculogram (EOG), submentalis EMG (chin), nasal and oral airflow, thoracic and abdominal wall motion, anterior tibialis EMG, snore microphone, electrocardiogram, and pulse oximetry.  MEDICATIONS Patient's medications include:Xanax, ASA, Carvedilol, Zyrtec, Vit D3, Cymbalta, Flonase, Lasix, Losartan, Ivabradine, Niaspan, Antivert, Nortriptyline, Prilosec, Evista, Ultram. Medications self-administered by patient during sleep study : No sleep medicine administered.  SLEEP ARCHITECTURE The study was initiated at 9:38:52 PM and ended at 4:46:50 AM. Sleep onset time was 69.0 minutes and the sleep efficiency was reduced at 67.6%. The total sleep time was 289.5 minutes. Stage REM latency was N/A minutes. The patient spent 23.83% of the night in stage N1 sleep, 76.17% in stage N2 sleep, 0.00% in stage N3 and 0.00% in REM. Alpha intrusion was absent. Supine sleep was 50.78%.  RESPIRATORY PARAMETERS The overall apnea/hypopnea index (AHI) was 6.4 per hour. There were 0 total apneas, including 0 obstructive, 0 central and 0 mixed apneas. There were 31 hypopneas and 126 RERAs. The AHI during Stage REM sleep was N/A per hour. AHI while supine was 7.3 per hour. The mean oxygen saturation was 93.86%. The minimum  SpO2 during sleep was 89.00%. Moderate snoring was noted during this study.  CARDIAC DATA The 2 lead EKG demonstrated sinus rhythm. The mean heart rate was 67.01 beats per minute. Other EKG findings include: PVCs.  LEG MOVEMENT DATA The total PLMS were 0 with a resulting PLMS index of 0.00. Associated arousal with leg movement index was 0.0 .  IMPRESSIONS - Mild obstructive sleep apnea occurred during this study (AHI = 6.4/h). - No significant central sleep apnea occurred during this study (CAI = 0.0/h). - The patient had minimal or no oxygen desaturation during the study (Min O2 = 89.00%) - The patient snored with Moderate snoring volume. - EKG findings include PVCs. - Clinically significant periodic limb movements did not occur during sleep. No significant associated arousals.  DIAGNOSIS - Obstructive Sleep Apnea (327.23 [G47.33 ICD-10])  RECOMMENDATIONS - Refer to sleep clinic to discuss treatment options.  - Avoid alcohol, sedatives and other CNS depressants that may worsen sleep apnea and disrupt normal sleep architecture. - Sleep hygiene should be reviewed to assess factors that may improve sleep quality. - Weight management and regular exercise should be initiated or continued if appropriate.    Sueanne Margarita Diplomate, American Board of Sleep Medicine  ELECTRONICALLY SIGNED ON:  12/31/2015, 7:24 PM [place] PH: [phone]   FX: [phone] [place]

## 2016-01-01 ENCOUNTER — Telehealth: Payer: Self-pay | Admitting: *Deleted

## 2016-01-01 NOTE — Telephone Encounter (Signed)
Returning your call. °

## 2016-01-01 NOTE — Telephone Encounter (Signed)
Tried to call pt to discuss her sleep study that she had with Dr. Radford Pax.  I have forward a message to Sammuel Bailiff, RN, Dr. Landis Gandy nurse, to find an appt for pt.   Was unable to leave message, as pt does not have an answering machine on home phone and voicemail has not been set up on the cell. Will call back at a later time

## 2016-01-01 NOTE — Telephone Encounter (Signed)
Returned pts call and advised her that her sleep study shows mild sleep apnea and that we would like for her to come and see Dr. Radford Pax in the sleep clinic for treatment options.  Pt is aware that we are waiting for an appt with Dr. Radford Pax and we will call her back with that information. Pt verbalized appreciation and understanding.

## 2016-01-01 NOTE — Telephone Encounter (Signed)
Patient is scheduled for 02/26/16 @945am  to discuss.

## 2016-01-02 ENCOUNTER — Ambulatory Visit
Admission: RE | Admit: 2016-01-02 | Discharge: 2016-01-02 | Disposition: A | Discharge status: Home / Self Care | Source: Ambulatory Visit | Attending: Family Medicine | Admitting: Family Medicine

## 2016-01-02 DIAGNOSIS — R928 Other abnormal and inconclusive findings on diagnostic imaging of breast: Secondary | ICD-10-CM

## 2016-01-20 ENCOUNTER — Ambulatory Visit: Admitting: Cardiology

## 2016-02-26 ENCOUNTER — Ambulatory Visit (INDEPENDENT_AMBULATORY_CARE_PROVIDER_SITE_OTHER): Admitting: Cardiology

## 2016-02-26 ENCOUNTER — Encounter: Payer: Self-pay | Admitting: Cardiology

## 2016-02-26 VITALS — BP 146/94 | HR 88 | Ht 64.0 in | Wt 233.4 lb

## 2016-02-26 DIAGNOSIS — I10 Essential (primary) hypertension: Secondary | ICD-10-CM

## 2016-02-26 DIAGNOSIS — I420 Dilated cardiomyopathy: Secondary | ICD-10-CM | POA: Diagnosis not present

## 2016-02-26 DIAGNOSIS — G4733 Obstructive sleep apnea (adult) (pediatric): Secondary | ICD-10-CM | POA: Diagnosis not present

## 2016-02-26 DIAGNOSIS — Z6841 Body Mass Index (BMI) 40.0 and over, adult: Secondary | ICD-10-CM

## 2016-02-26 DIAGNOSIS — I5022 Chronic systolic (congestive) heart failure: Secondary | ICD-10-CM

## 2016-02-26 LAB — BASIC METABOLIC PANEL
BUN: 12 mg/dL (ref 7–25)
CALCIUM: 8.9 mg/dL (ref 8.6–10.4)
CO2: 27 mmol/L (ref 20–31)
CREATININE: 0.95 mg/dL (ref 0.50–0.99)
Chloride: 100 mmol/L (ref 98–110)
GLUCOSE: 83 mg/dL (ref 65–99)
Potassium: 4.1 mmol/L (ref 3.5–5.3)
Sodium: 137 mmol/L (ref 135–146)

## 2016-02-26 MED ORDER — SACUBITRIL-VALSARTAN 24-26 MG PO TABS: 1.0000 | ORAL_TABLET | Freq: Two times a day (BID) | ORAL | Status: DC

## 2016-02-26 NOTE — Progress Notes (Signed)
Cardiology Office Note    Date:  02/26/2016   ID:  [name], DOB 18-Oct-1956, MRN [id number]  PCP:  Gennette Pac, MD  Cardiologist:  Sueanne Margarita, MD   Chief Complaint  Patient presents with  . Hypertension  . Sleep Apnea    History of Present Illness:  Meagan Ryan is a 60 y.o. 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 She is doing well. She denies any chest pain or palpitations. She has chronic SOB that she thinks may be getting worse.  She denies any LE edema,PBD, orthopnea,  dizziness or syncope.  .    Past Medical History  Diagnosis Date  . Pelvic pain in female   . Hypertension   . History of IBS   . History of hyperlipidemia   . H/O allergic rhinitis   . H/O osteopenia 2009  . HX: breast cancer left 1996  . Tubular adenoma of colon   . H/O fibromyalgia   . H/O actinic keratosis   . Dysuria 2011  . PMB (postmenopausal bleeding) 2011  . H/O peripheral neuropathy   . Hx of colonic polyps   . Hx of migraines   . Asthma   . H/O varicose veins   . Hx of ovarian cyst   . Pregnancy induced hypertension   . PMB (postmenopausal bleeding) 2011  . Hyperlipidemia   . Allergic rhinitis   . Right-sided lacunar infarction (Meagan Ryan)     MRI 1.2012  . Fatty liver CT 1/12    elevated LFT  . Incomplete RBBB   . DCM (dilated cardiomyopathy) (Meagan Ryan)   . 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 Complication of anesthesia     WAKE UP WITH HEADACHE  . PONV (postoperative nausea and vomiting)   . Breast cancer (Meagan Ryan) 1996    left breast-stage II infiltrating ductal carcinoma, 3/8 axillary lymph nodes were positive for metastatic carcinoma, getting yearly mammogram and every other year MRI followed by oncologist Dr Benay Spice  . Family history of adverse reaction to anesthesia     "family wakes up with personality changes, but are very short term"  . Meniere disease   . OSA (obstructive sleep  apnea) 12/31/2015    Very mild with AHI 6/hr but elevated RDI at 32/hr.  . Chronic systolic CHF (congestive heart failure), NYHA class 2 ([place]) 02/26/2016    Past Surgical History  Procedure Laterality Date  . Gastroc muscle repair  2007  . Mastectomy  06/1995    Left - for infiltrating ductal carcinoma  . Tubal ligation    . Nephrectomy Right late 1990s    due to kidney shrinkage  . Laparoscopic cholecystectomy    . Tram surgery  1996  . Robotic assisted total hysterectomy with bilateral salpingo oopherectomy Bilateral 10/09/2015    Procedure: ROBOTIC ASSISTED BILATERAL SALPINGO OOPHORECTOMY ;  Surgeon: Everitt Amber, MD;  Location: [facility];  Service: Gynecology;  Laterality: Bilateral;    Current Medications: Outpatient Prescriptions Prior to Visit  Medication Sig Dispense Refill  . ALPRAZolam (XANAX) 0.5 MG tablet Take 0.25 mg by mouth 2 (two) times daily.     Marland Kitchen aspirin 81 MG tablet Take 81 mg by mouth every morning.     Marland Kitchen azelastine (ASTELIN) 0.1 % nasal spray Place 1 spray into both nostrils 2 (two) times daily as needed for rhinitis or allergies.     Marland Kitchen  carvedilol (COREG) 3.125 MG tablet TAKE 1 TABLET (3.125 MG TOTAL) BY MOUTH 2 (TWO) TIMES DAILY WITH A MEAL. 180 tablet 3  . cetirizine (ZYRTEC) 10 MG tablet Take 10 mg by mouth every morning.     . Cholecalciferol (VITAMIN D-3 PO) Take 1 tablet by mouth 2 (two) times daily. Reported on 10/30/2015    . DULoxetine (CYMBALTA) 30 MG capsule Take 30 mg by mouth at bedtime.     . fluticasone (FLONASE) 50 MCG/ACT nasal spray Place 1 spray into both nostrils every morning.     . furosemide (LASIX) 20 MG tablet Take 20 mg by mouth. 20 mg, Oral, As needed, for increase of 6 lbs of dry weight. Take with extra potassium supplement    . ivabradine (CORLANOR) 5 MG TABS tablet Take 1 tablet (5 mg total) by mouth 2 (two) times daily with a meal. 180 tablet 3  . losartan (COZAAR) 50 MG tablet Take 0.5 tablets (25 mg total) by mouth daily. 90 tablet 3  .  meclizine (ANTIVERT) 12.5 MG tablet Take 12.5 mg by mouth 3 (three) times daily as needed for dizziness.    . niacin (NIASPAN) 500 MG CR tablet Take 500 mg by mouth every morning.     . nortriptyline (PAMELOR) 25 MG capsule Take 25 mg by mouth at bedtime as needed for sleep. For sleep    . Omega-3 Fatty Acids (FISH OIL) 1200 MG CAPS Take 1,400 mg by mouth 2 (two) times daily.    . potassium chloride (K-DUR) 10 MEQ tablet Take 10 mEq by mouth every morning.     . raloxifene (EVISTA) 60 MG tablet Take 60 mg by mouth every morning.     Marland Kitchen doxycycline (VIBRAMYCIN) 100 MG capsule Take 1 capsule (100 mg total) by mouth 2 (two) times daily. One po bid x 7 days 14 capsule 0  . omeprazole (PRILOSEC) 10 MG capsule Take 10 mg by mouth every morning.     . predniSONE (DELTASONE) 20 MG tablet 3 tabs po day one, then 2 po daily x 4 days 11 tablet 0  . traMADol (ULTRAM) 50 MG tablet Take 1 tablet (50 mg total) by mouth every 6 (six) hours as needed. 15 tablet 0  . Vitamin D, Ergocalciferol, (DRISDOL) 50000 UNITS CAPS capsule      No facility-administered medications prior to visit.     Allergies:   Amlodipine; Norvasc; Cephalexin; Codeine; Gluten meal; Noroxin; Sulfa antibiotics; Sulfamethoxazole; Latex; and Penicillins   Social History   Social History  . Marital Status: Widowed    Spouse Name: N/A  . Number of Children: N/A  . Years of Education: N/A   Social History Main Topics  . Smoking status: Never Smoker   . Smokeless tobacco: Never Used  . Alcohol Use: No  . Drug Use: No  . Sexual Activity: Not Asked   Other Topics Concern  . 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 Wears Ear plugs   Marital Status: Widowed   1 daughter     Family History:  The patient's family history includes Cancer in her  father and sister; Diabetes in her mother; Heart disease in her mother; Hypertension in her mother; Stroke in her father and mother.   ROS:   Please see the  history of present illness.    Review of Systems  Constitution: Negative.  HENT: Negative.   Eyes: Negative.   Cardiovascular: Negative.   Respiratory: Positive for snoring and wheezing.   Skin: Negative.   Musculoskeletal: Negative.   Gastrointestinal: Negative.   Genitourinary: Negative.   Neurological: Negative.   Psychiatric/Behavioral: Negative.    All other systems reviewed and are negative.   PHYSICAL EXAM:   VS:  BP 146/94 mmHg  Pulse 88  Ht 5\' 4"  (1.626 m)  Wt 233 lb 6.4 oz (105.87 kg)  BMI 40.04 kg/m2   GEN: Well nourished, well developed, in no acute distress HEENT: normal Neck: no JVD, carotid bruits, or masses Cardiac: RRR; no murmurs, rubs, or gallops,no edema.  Intact distal pulses bilaterally.  Respiratory:  clear to auscultation bilaterally, normal work of breathing GI: soft, nontender, nondistended, + BS MS: no deformity or atrophy Skin: warm and dry, no rash Neuro:  Alert and Oriented x 3, Strength and sensation are intact Psych: euthymic mood, full affect  Wt Readings from Last 3 Encounters:  02/26/16 233 lb 6.4 oz (105.87 kg)  12/30/15 234 lb (106.142 kg)  11/13/15 232 lb (105.235 kg)      Studies/Labs Reviewed:   EKG:  EKG is not ordered today.    Recent Labs: 09/30/2015: ALT 31 10/10/2015: B Natriuretic Peptide 151.5*; BUN 12; Creatinine, Ser 1.17*; Hemoglobin 13.3; Platelets 218; Potassium 4.5; Sodium 138   Lipid Panel No results found for: CHOL, TRIG, HDL, CHOLHDL, VLDL, LDLCALC, LDLDIRECT  Additional studies/ records that were reviewed today include:  non    ASSESSMENT:    1. DCM (dilated cardiomyopathy) ([place])   2. Benign essential HTN   3. OSA (obstructive sleep apnea)   4. Morbid obesity with BMI of 40.0-44.9, adult ([place])   5. Chronic systolic CHF (congestive heart  failure), NYHA class 2 ([facility])      PLAN:  In order of problems listed above:  1. DCM - nonischemic with EF 30-35% by last echo.  Coronary CTA with no CAD.  DCM secondary to prior chemo.  Repeat echo in 3 months after starting Entresto.   2. HTN - poorly controlled.  Stopping losartan since we are starting Entresto.  She will followup in HTN clinilc in 2 weeks 3. OSA - she just had a PSG done showing mild OSA.  We discussed treatment options including CPAP and oral device.  She has a lot of daytime sleepiness and says that she liked the mask she tried on at the sleep lab.  She would like to proceed with CPAP titration which we will set up.   4. Obesity - I have encouraged him to get into a routine exercise program and cut back on carbs and portions.  5. Chronic systolic CHF - she appears euvolemic on exam but still SOB.  I am going to stop her ARB and start Entresto since she has NYHA class II CHF.  She will be seen back in HTN/CHF clinic in 2 weeks for titration of meds.  I will check a BMET today and then in 2 weeks. She will continue Corlanor and Coreg as well as Lasix.    Medication Adjustments/Labs and Tests Ordered: Current medicines are reviewed at length with the patient today.  Concerns regarding medicines are outlined above.  Medication changes, Labs and Tests ordered today are listed in the Patient Instructions below. There are no Patient Instructions on file for this visit.   Lurena Nida, MD  02/26/2016  10:48 AM    [facility] [place], [place], [place]  [address] Phone: [phone]; Fax: [phone]

## 2016-02-26 NOTE — Patient Instructions (Signed)
Medication Instructions:  Your physician has recommended you make the following change in your medication:  1) START ENTRESTO 24/26 mg TWICE DAILY 2) STOP LOSARTAN  Labwork: TODAY: BMET  Testing/Procedures: Your physician has requested that you have an echocardiogram in July, 2017. Echocardiography is a painless test that uses sound waves to create images of your heart. It provides your doctor with information about the size and shape of your heart and how well your heart's chambers and valves are working. This procedure takes approximately one hour. There are no restrictions for this procedure.  Dr. Radford Pax recommends you have a CPAP TITRATION.  Follow-Up: Your physician recommends that you schedule a follow-up appointment in 2 WEEKS with Gay Filler in the [facility] Clinic.   Your physician recommends that you schedule a follow-up appointment in 3 MONTHS with Dr. Radford Pax.  Any Other Special Instructions Will Be Listed Below (If Applicable).     If you need a refill on your cardiac medications before your next appointment, please call your pharmacy.

## 2016-02-27 ENCOUNTER — Telehealth: Payer: Self-pay

## 2016-02-27 NOTE — Telephone Encounter (Signed)
Prior auth for [employer] 24-26 obtained from [facility]. Case ID # [id number], pharmacy notified.

## 2016-02-27 NOTE — Telephone Encounter (Signed)
Entresto approved through 11/07/2098.

## 2016-03-10 ENCOUNTER — Ambulatory Visit (INDEPENDENT_AMBULATORY_CARE_PROVIDER_SITE_OTHER): Admitting: Pharmacist

## 2016-03-10 ENCOUNTER — Other Ambulatory Visit: Payer: Self-pay

## 2016-03-10 VITALS — BP 132/98 | HR 68 | Wt 232.0 lb

## 2016-03-10 DIAGNOSIS — I10 Essential (primary) hypertension: Secondary | ICD-10-CM

## 2016-03-10 LAB — BASIC METABOLIC PANEL
BUN: 11 mg/dL (ref 7–25)
CALCIUM: 9.2 mg/dL (ref 8.6–10.4)
CO2: 27 mmol/L (ref 20–31)
Chloride: 103 mmol/L (ref 98–110)
Creat: 0.93 mg/dL (ref 0.50–0.99)
Glucose, Bld: 110 mg/dL — ABNORMAL HIGH (ref 65–99)
Potassium: 4.4 mmol/L (ref 3.5–5.3)
SODIUM: 139 mmol/L (ref 135–146)

## 2016-03-10 MED ORDER — SACUBITRIL-VALSARTAN 24-26 MG PO TABS: 1.0000 | ORAL_TABLET | Freq: Two times a day (BID) | ORAL | Status: DC

## 2016-03-10 NOTE — Progress Notes (Signed)
Pt seen by myself and pharmacy resident.  Agree with assessment.  Pt stated SOB is improving.  Given BP is controlled and symptoms improving will not increase Entresto at this time.  Instructed pt to call if she has any increase in SOB.  She has follow up with Dr. Radford Pax in July.

## 2016-03-10 NOTE — Progress Notes (Signed)
Patient ID: Meagan Ryan                 DOB: 11/13/1955                      MRN: [id number]     HPI: Meagan Ryan is a 60 y.o. female referred by Dr. Radford Pax to HTN clinic. Recently started Entresto approximately 2 weeks ago; tolerating well. Denies dizziness or lightheadedness. Denies adverse effects. Complains of some increase in wheezing with mildly improved shortness of breath from last visit with Dr. Radford Pax 3 weeks ago. Weighs daily at home and reports weight of approximately 228-230lbs. On lasix 20mg  PRN with last dose being sometime in the last week. Does not check BP at home.   Current HTN meds: Carvedilol 3.25mg  BID, Entresto 24/26mg  BID Previously tried: HCTZ 25mg  daily, losartan 50mg  daily, olmesartan 40mg  daily  4/20 BMET: K: 4.1 SCr: 0.95  BP goal: <140/90 mmHg  Family History: Cancer and stroke - father, cancer - sister; Diabetes, heart disease, hypertension, stroke - mother   Social History: Never smoked, denies alcohol or illicit drugs   Wt Readings from Last 3 Encounters:  03/10/16 232 lb (105.235 kg)  02/26/16 233 lb 6.4 oz (105.87 kg)  12/30/15 234 lb (106.142 kg)   BP Readings from Last 3 Encounters:  03/10/16 132/98  02/26/16 146/94  11/13/15 154/92   Pulse Readings from Last 3 Encounters:  03/10/16 68  02/26/16 88  11/13/15 68    Renal function: Estimated Creatinine Clearance: 73.5 mL/min (by C-G formula based on Cr of 0.95).  Past Medical History  Diagnosis Date  . Pelvic pain in female   . Hypertension   . History of IBS   . History of hyperlipidemia   . H/O allergic rhinitis   . H/O osteopenia 2009  . HX: breast cancer left 1996  . Tubular adenoma of colon   . H/O fibromyalgia   . H/O actinic keratosis   . Dysuria 2011  . PMB (postmenopausal bleeding) 2011  . H/O peripheral neuropathy   . Hx of colonic polyps   . Hx of migraines   . Asthma   . H/O varicose veins   . Hx of ovarian cyst   . Pregnancy induced hypertension   . PMB  (postmenopausal bleeding) 2011  . Hyperlipidemia   . Allergic rhinitis   . Right-sided lacunar infarction ([place])     MRI 1.2012  . Fatty liver CT 1/12    elevated LFT  . Incomplete RBBB   . DCM (dilated cardiomyopathy) ([place])   . 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 Complication of anesthesia     WAKE UP WITH HEADACHE  . PONV (postoperative nausea and vomiting)   . Breast cancer ([place]) 1996    left breast-stage II infiltrating ductal carcinoma, 3/8 axillary lymph nodes were positive for metastatic carcinoma, getting yearly mammogram and every other year MRI followed by oncologist Dr Benay Spice  . Family history of adverse reaction to anesthesia     "family wakes up with personality changes, but are very short term"  . Meniere disease   . OSA (obstructive sleep apnea) 12/31/2015    Very mild with AHI 6/hr but elevated RDI at 32/hr.  . Chronic systolic CHF (congestive heart failure), NYHA class 2 ([place]) 02/26/2016    Current Outpatient Prescriptions on File Prior to Visit  Medication Sig Dispense Refill  . ALPRAZolam (XANAX) 0.5 MG tablet  Take 0.25 mg by mouth 2 (two) times daily.     Marland Kitchen aspirin 81 MG tablet Take 81 mg by mouth every morning.     Marland Kitchen azelastine (ASTELIN) 0.1 % nasal spray Place 1 spray into both nostrils 2 (two) times daily as needed for rhinitis or allergies.     . carvedilol (COREG) 3.125 MG tablet TAKE 1 TABLET (3.125 MG TOTAL) BY MOUTH 2 (TWO) TIMES DAILY WITH A MEAL. 180 tablet 3  . cetirizine (ZYRTEC) 10 MG tablet Take 10 mg by mouth every morning.     . Cholecalciferol (VITAMIN D-3 PO) Take 1 tablet by mouth 2 (two) times daily. Reported on 10/30/2015    . DULoxetine (CYMBALTA) 30 MG capsule Take 30 mg by mouth at bedtime.     . fluticasone (FLONASE) 50 MCG/ACT nasal spray Place 1 spray into both nostrils every morning.     . furosemide (LASIX) 20 MG tablet Take 20 mg by mouth. 20 mg, Oral, As needed, for increase of 6 lbs of dry weight. Take with extra potassium  supplement    . gabapentin (NEURONTIN) 300 MG capsule Take 300 mg by mouth 2 (two) times daily.    . ivabradine (CORLANOR) 5 MG TABS tablet Take 1 tablet (5 mg total) by mouth 2 (two) times daily with a meal. 180 tablet 3  . meclizine (ANTIVERT) 12.5 MG tablet Take 12.5 mg by mouth 3 (three) times daily as needed for dizziness.    . niacin (NIASPAN) 500 MG CR tablet Take 500 mg by mouth every morning.     . nortriptyline (PAMELOR) 25 MG capsule Take 25 mg by mouth at bedtime as needed for sleep. For sleep    . Omega-3 Fatty Acids (FISH OIL) 1200 MG CAPS Take 1,400 mg by mouth 2 (two) times daily.    Marland Kitchen omeprazole (PRILOSEC) 20 MG capsule Take 20 mg by mouth daily.    . potassium chloride (K-DUR) 10 MEQ tablet Take 10 mEq by mouth every morning.     . raloxifene (EVISTA) 60 MG tablet Take 60 mg by mouth every morning.      No current facility-administered medications on file prior to visit.    Allergies  Allergen Reactions  . Amlodipine Shortness Of Breath  . Norvasc [Amlodipine Besylate] Shortness Of Breath    Breathing difficulties  . Cephalexin Other (See Comments) and Nausea And Vomiting    Fever got to 105 High temperature  . Codeine Nausea And Vomiting  . Gluten Meal Other (See Comments)    Gluten free diet required  . Noroxin [Norfloxacin]     Pt do not remember  . Sulfa Antibiotics Nausea And Vomiting  . Sulfamethoxazole Nausea And Vomiting  . Latex Itching and Rash    redness  . Penicillins Rash    Has patient had a PCN reaction causing immediate rash, facial/tongue/throat swelling, SOB or lightheadedness with hypotension:No Has patient had a PCN reaction causing severe rash involving mucus membranes or skin necrosis: Yes Has patient had a PCN reaction that required hospitalization No Has patient had a PCN reaction occurring within the last 10 years: No If all of the above answers are "NO", then may proceed with Cephalosporin use.      Assessment/Plan:  1.  Hypertension: BP controlled in clinic today at 132/98 with HR of 68. BMET today considering recent Entresto start. Weight stable. Will defer any medication changes at this time. Follow up as needed.   Stephens November, PharmD Clinical Pharmacy Resident 10:59  AM, 03/10/2016

## 2016-03-20 ENCOUNTER — Ambulatory Visit (HOSPITAL_BASED_OUTPATIENT_CLINIC_OR_DEPARTMENT_OTHER): Attending: Cardiology

## 2016-03-20 VITALS — Ht 64.0 in | Wt 233.0 lb

## 2016-03-20 DIAGNOSIS — G4733 Obstructive sleep apnea (adult) (pediatric): Secondary | ICD-10-CM | POA: Diagnosis not present

## 2016-03-20 DIAGNOSIS — G4730 Sleep apnea, unspecified: Secondary | ICD-10-CM | POA: Diagnosis present

## 2016-03-20 DIAGNOSIS — Z9989 Dependence on other enabling machines and devices: Secondary | ICD-10-CM

## 2016-03-20 DIAGNOSIS — R0683 Snoring: Secondary | ICD-10-CM | POA: Insufficient documentation

## 2016-04-01 NOTE — Procedures (Addendum)
See note dated 04/05/2016

## 2016-04-05 ENCOUNTER — Telehealth: Payer: Self-pay | Admitting: Cardiology

## 2016-04-05 NOTE — Procedures (Unsigned)
   Patient Name: Meagan Ryan, Meagan Ryan MRN: [id number] Study Date: 03/20/2016 Gender: Female D.O.B: 1956/03/19 Age (years): 59 Referring Provider: Eileen Stanford Interpreting Physician: Fransico Him MD, ABSM RPSGT: Jonna Coup  Weight (lbs): 234 BMI: 40 Neck Size: 15.50 Height (inches): 64  CLINICAL INFORMATION The patient is referred for a CPAP titration to treat sleep apnea.   SLEEP STUDY TECHNIQUE As per the AASM Manual for the Scoring of Sleep and Associated Events v2.3 (April 2016) with a hypopnea requiring 4% desaturations. The channels recorded and monitored were frontal, central and occipital EEG, electrooculogram (EOG), submentalis EMG (chin), nasal and oral airflow, thoracic and abdominal wall motion, anterior tibialis EMG, snore microphone, electrocardiogram, and pulse oximetry. Continuous positive airway pressure (CPAP) was initiated at the beginning of the study and titrated to treat sleep-disordered breathing.  MEDICATIONS Medications taken by the patient :Reviewed in the chart Medications administered by patient during sleep study : No sleep medicine administered.  TECHNICIAN COMMENTS Comments added by technician: Patient had difficulty initiating sleep.  Comments added by scorer: N/A  RESPIRATORY PARAMETERS Optimal PAP Pressure (cm): 12  AHI at Optimal Pressure (/hr):0.0 Overall Minimal O2 (%):91.00   Supine % at Optimal Pressure (%):0 Minimal O2 at Optimal Pressure (%):94.0    SLEEP ARCHITECTURE The study was initiated at 10:49:41 PM and ended at 4:52:27 AM. Sleep onset time was 87.6 minutes and the sleep efficiency was reduced at 54.6%. The total sleep time was 198.0 minutes. The patient spent 4.29% of the night in stage N1 sleep, 95.71% in stage N2 sleep, 0.00% in stage N3 and 0.00% in REM.Stage REM latency was N/A minutes Wake after sleep onset was 77.2. Alpha intrusion was absent. Supine sleep was 43.69%.  CARDIAC DATA The 2 lead EKG demonstrated  sinus rhythm. The mean heart rate was 57.19 beats per minute. Other EKG findings include: None.  LEG MOVEMENT DATA The total Periodic Limb Movements of Sleep (PLMS) were 16. The PLMS index was 4.85. A PLMS index of <15 is considered normal in adults.  IMPRESSIONS - The optimal PAP pressure was 12 cm of water. - Central sleep apnea was not noted during this titration (CAI = 0.0/h). - Significant oxygen desaturations were not observed during this titration (min O2 = 91.00%). - The patient snored with Moderate snoring volume during this titration study. - No cardiac abnormalities were observed during this study. - Clinically significant periodic limb movements were not noted during this study. Arousals associated with PLMs were rare.  DIAGNOSIS - Obstructive Sleep Apnea (327.23 [G47.33 ICD-10])  RECOMMENDATIONS - Trial of CPAP therapy on 12 cm H2O with a Small size Fisher&Paykel Full Face Mask Simplus mask and heated humidification. - Avoid alcohol, sedatives and other CNS depressants that may worsen sleep apnea and disrupt normal sleep architecture. - Sleep hygiene should be reviewed to assess factors that may improve sleep quality. - Weight management and regular exercise should be initiated or continued. - Return to Sleep Center for re-evaluation after 10 weeks of therapy   [place], [facility]  ELECTRONICALLY SIGNED ON:  04/05/2016, 3:37 PM [place] PH: [phone]   FX: [phone] [place]

## 2016-04-05 NOTE — Telephone Encounter (Signed)
Pt had successful PAP titration. Please setup appointment in 10 weeks. Please let [facility] know that order for PAP is in [facility].   

## 2016-04-07 NOTE — Telephone Encounter (Signed)
Patient informed of information.  Stated verbal understanding.  [place] Notified of orders.

## 2016-05-27 ENCOUNTER — Other Ambulatory Visit (HOSPITAL_COMMUNITY)

## 2016-06-14 ENCOUNTER — Ambulatory Visit: Admitting: Cardiology

## 2016-07-13 ENCOUNTER — Other Ambulatory Visit: Payer: Self-pay

## 2016-07-13 ENCOUNTER — Ambulatory Visit (HOSPITAL_COMMUNITY): Attending: Cardiovascular Disease

## 2016-07-13 DIAGNOSIS — Z6841 Body Mass Index (BMI) 40.0 and over, adult: Secondary | ICD-10-CM | POA: Diagnosis not present

## 2016-07-13 DIAGNOSIS — I420 Dilated cardiomyopathy: Secondary | ICD-10-CM | POA: Diagnosis not present

## 2016-07-13 DIAGNOSIS — I358 Other nonrheumatic aortic valve disorders: Secondary | ICD-10-CM | POA: Diagnosis not present

## 2016-07-13 DIAGNOSIS — I110 Hypertensive heart disease with heart failure: Secondary | ICD-10-CM | POA: Insufficient documentation

## 2016-07-13 DIAGNOSIS — I5022 Chronic systolic (congestive) heart failure: Secondary | ICD-10-CM | POA: Diagnosis not present

## 2016-07-13 DIAGNOSIS — E119 Type 2 diabetes mellitus without complications: Secondary | ICD-10-CM | POA: Insufficient documentation

## 2016-07-13 DIAGNOSIS — I4510 Unspecified right bundle-branch block: Secondary | ICD-10-CM | POA: Diagnosis not present

## 2016-07-13 DIAGNOSIS — E785 Hyperlipidemia, unspecified: Secondary | ICD-10-CM | POA: Insufficient documentation

## 2016-07-13 LAB — ECHOCARDIOGRAM COMPLETE
AOASC: 32 cm
CHL CUP MV DEC (S): 246
E decel time: 246 msec
E/e' ratio: 6.51
FS: 23 % — AB (ref 28–44)
IV/PV OW: 1.22
LA ID, A-P, ES: 41 mm
LA diam end sys: 41 mm
LA diam index: 1.96 cm/m2
LAVOLA4C: 46 mL
LDCA: 3.46 cm2
LV E/e'average: 6.51
LV TDI E'LATERAL: 8.19
LV TDI E'MEDIAL: 6.63
LVEEMED: 6.51
LVELAT: 8.19 cm/s
LVOT SV: 52 mL
LVOT VTI: 14.9 cm
LVOT peak grad rest: 3 mmHg
LVOTD: 21 mm
LVOTPV: 91.7 cm/s
MV pk E vel: 53.3 m/s
MVPKAVEL: 69.6 m/s
PW: 9.87 mm — AB (ref 0.6–1.1)
RV sys press: 21 mmHg
Reg peak vel: 210 cm/s
TR max vel: 210 cm/s

## 2016-07-17 ENCOUNTER — Other Ambulatory Visit: Payer: Self-pay | Admitting: Orthopedic Surgery

## 2016-07-17 DIAGNOSIS — M545 Low back pain: Secondary | ICD-10-CM

## 2016-07-25 ENCOUNTER — Ambulatory Visit
Admission: RE | Admit: 2016-07-25 | Discharge: 2016-07-25 | Disposition: A | Discharge status: Home / Self Care | Source: Ambulatory Visit | Attending: Orthopedic Surgery | Admitting: Orthopedic Surgery

## 2016-07-25 DIAGNOSIS — M545 Low back pain: Secondary | ICD-10-CM

## 2016-07-29 ENCOUNTER — Ambulatory Visit: Admitting: Cardiology

## 2016-09-07 ENCOUNTER — Ambulatory Visit (INDEPENDENT_AMBULATORY_CARE_PROVIDER_SITE_OTHER): Payer: Self-pay | Admitting: Orthopaedic Surgery

## 2016-10-08 ENCOUNTER — Ambulatory Visit (INDEPENDENT_AMBULATORY_CARE_PROVIDER_SITE_OTHER): Admitting: Orthopedic Surgery

## 2016-10-08 ENCOUNTER — Ambulatory Visit (HOSPITAL_COMMUNITY)
Admission: RE | Admit: 2016-10-08 | Discharge: 2016-10-08 | Disposition: A | Discharge status: Home / Self Care | Source: Ambulatory Visit | Attending: Orthopedic Surgery | Admitting: Orthopedic Surgery

## 2016-10-08 ENCOUNTER — Encounter (INDEPENDENT_AMBULATORY_CARE_PROVIDER_SITE_OTHER): Payer: Self-pay | Admitting: Orthopedic Surgery

## 2016-10-08 DIAGNOSIS — M79604 Pain in right leg: Secondary | ICD-10-CM | POA: Diagnosis not present

## 2016-10-08 NOTE — Progress Notes (Signed)
VASCULAR LAB PRELIMINARY  PRELIMINARY  PRELIMINARY  PRELIMINARY  Right lower extremity venous duplex completed.    Preliminary report:  Right:  No evidence of DVT, superficial thrombosis, or Baker's cyst.  Meagan Ryan, RVS 10/08/2016, 11:18 AM

## 2016-10-08 NOTE — Progress Notes (Addendum)
Office Visit Note   Patient: Meagan Ryan           Date of Birth: 19-Jul-1956           MRN: [id number] Visit Date: 10/08/2016 Requested by: Hulan Fess, MD [place], [place] [id number] PCP: Gennette Pac, MD  Subjective: Chief Complaint  Patient presents with  . Right Leg - Pain    HPI Meagan Ryan is a 60 year old female with pain in the posterior medial right leg extending into the calf and down to the foot.  It is sharp pain.  Denies much in way of numbness and tingling but does report weakness.  She has seen Dr. Inda Merlin and radiographs of the right leg were interpreted as normal.  I reviewed them as well and do not see much in terms of arthritis or tissue calcification around the knee.  MRI of the L-spine is also done and reviewed with the patient today.  Shows multilevel spondylosis but nothing really significant in terms of nerve compression in that area.  Patient states that her foot is flopping and not working.  Steps are difficult.  She states that she's been falling some.  She states that flexion of the right knee hurts and it is difficult for her to do.  She did have a right knee injection 07/27/2016 which gave her only a modicum of relief.  She describes 3 months of no strength in the right leg and foot.  She actually describes having an abrasion on her right knee from a fall.  She really denies any back pain.  She does have a history of heel cord lengthening on the right-hand side done by Dr. due to several years ago.  Importantly she does have a history of breast cancer 21 years ago which sounds like it was metastatic but she underwent double dose chemotherapy and this has given her some peripheral neuropathy.  She takes Cymbalta for this problem.              Review of Systems All systems reviewed are negative as they relate to the chief complaint within the history of present illness.  Patient denies  fevers or chills. Also patient denies any other systemic  symptoms of illness and other joint pain. Assessment & Plan: Visit Diagnoses:  1. Right leg pain     Plan: Impression is right leg pain with demonstrable weakness in ankle plantar flexion eversion and dorsiflexion.  Back MRI scan does not show any right-sided compressive lesions.  The nerves being affected may be compressed either at the knee or the hip.  Alternatively this could represent the natural progression of peripheral neuropathy from the chemotherapeutic agents 20 years ago.  Another possibility is some type of CNS issue which is giving her weakness in that right leg.  This could be affecting the cerebellum.  Plan at this time is to do nerve study to rule out peripheral compression.  If that study is normal we could consider scanning the knee since she feels some fullness in this area and inability to bend.  Alternatively we may need to schedule for brain MRI and/or neurological referral. I also want to obtain an ultrasound rule out DVT right lower extremity.  That was negative at the time of this dictation Follow-Up Instructions: Return in about 3 weeks (around 10/29/2016).   Orders:  No orders of the defined types were placed in this encounter.  No orders of the defined types were placed in this  encounter.     Procedures: No procedures performed   Clinical Data: No additional findings.  Objective: Vital Signs: There were no vitals taken for this visit.  Physical Exam  Constitutional: She appears well-developed.  HENT:  Head: Normocephalic.  Eyes: EOM are normal.  Neck: Normal range of motion.  Cardiovascular: Normal rate.   Pulmonary/Chest: Effort normal.  Neurological: She is alert.  Skin: Skin is warm.  Psychiatric: She has a normal mood and affect.    Ortho Exam examination of the bilateral lower extremities demonstrates symmetric and diminished reflexes bilateral patella and Achilles.  She does have difficulty standing on her toes with the right leg versus the  left.  She does have visible swelling in the right calf compared to the left about 1 cm.  No effusion in the knee is present.  Range of motion generally symmetric on the right versus left.  Collaterals and creases are stable on the right.  There is no groin pain with internal/external rotation of the leg.  No other masses lymph adenopathy or skin changes noted in the right knee region.  Again ankle dorsiflexion eversion and plantarflexion is about a half grade weaker on the right compared to the left.  No definite paresthesias in the foot however.  No real back pain sciatic notch pain.  Specialty Comments:  No specialty comments available.  Imaging: No results found.   PMFS History: Patient Active Problem List   Diagnosis Date Noted  . Chronic systolic CHF (congestive heart failure), NYHA class 2 ([place]) 02/26/2016  . OSA (obstructive sleep apnea) 12/31/2015  . History of breast cancer in female 10/30/2015  . Ovarian cyst 10/10/2015  . Bilateral ovarian cysts 10/09/2015  . S/P oophorectomy 10/09/2015  . Morbid obesity with BMI of 40.0-44.9, adult ([place]) 08/08/2015  . Benign essential HTN 09/18/2014  . RBBB 09/18/2014  . Incomplete RBBB   . DCM (dilated cardiomyopathy) ([place]) 04/24/2014  . Meniere disease 06/02/2012   Past Medical History:  Diagnosis Date  . Allergic rhinitis   . Asthma   . Breast cancer ([place]) 1996   left breast-stage II infiltrating ductal carcinoma, 3/8 axillary lymph nodes were positive for metastatic carcinoma, getting yearly mammogram and every other year MRI followed by oncologist Dr Benay Spice  . Chronic systolic CHF (congestive heart failure), NYHA class 2 ([place]) 02/26/2016  . Complication of anesthesia    WAKE UP WITH HEADACHE  . DCM (dilated cardiomyopathy) ([place])   . Dysuria 2011  . Family history of adverse reaction to anesthesia    "family wakes up with personality changes, but are very short term"  . Fatty liver CT 1/12   elevated LFT  . H/O actinic  keratosis   . H/O allergic rhinitis   . H/O fibromyalgia   . H/O osteopenia 2009  . H/O peripheral neuropathy   . H/O varicose veins   . History of hyperlipidemia   . History of IBS   . Hx of colonic polyps   . Hx of migraines   . Hx of ovarian cyst   . HX: breast cancer left 1996  . Hyperlipidemia   . Hypertension   . Incomplete RBBB   . 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 Meniere disease   . OSA (obstructive sleep apnea) 12/31/2015   Very mild with AHI 6/hr but elevated RDI at 32/hr.  . Pelvic pain in female   . PMB (postmenopausal bleeding) 2011  . PMB (postmenopausal bleeding) 2011  .  PONV (postoperative nausea and vomiting)   . Pregnancy induced hypertension   . Right-sided lacunar infarction ([place])    MRI 1.2012  . Tubular adenoma of colon     Family History  Problem Relation Age of Onset  . Heart disease Mother     MI  . Stroke Mother   . Diabetes Mother   . Hypertension Mother   . Cancer Father     Prostate  . Stroke Father   . Cancer Sister     Colon    Past Surgical History:  Procedure Laterality Date  . gastroc muscle repair  2007  . LAPAROSCOPIC CHOLECYSTECTOMY    . MASTECTOMY  06/1995   Left - for infiltrating ductal carcinoma  . 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 ROBOTIC ASSISTED TOTAL HYSTERECTOMY WITH BILATERAL SALPINGO OOPHERECTOMY Bilateral 10/09/2015   Procedure: ROBOTIC ASSISTED BILATERAL SALPINGO OOPHORECTOMY ;  Surgeon: Everitt Amber, MD;  Location: [facility];  Service: Gynecology;  Laterality: Bilateral;  . tram surgery  1996  . TUBAL LIGATION     Social History   Occupational History  . Not on file.   Social History Main Topics  . Smoking status: Never Smoker  . Smokeless tobacco: Never Used  . Alcohol use No  . Drug use: No  . Sexual activity: Not on file

## 2016-10-13 ENCOUNTER — Other Ambulatory Visit: Payer: Self-pay | Admitting: Cardiology

## 2016-10-13 ENCOUNTER — Other Ambulatory Visit (INDEPENDENT_AMBULATORY_CARE_PROVIDER_SITE_OTHER): Payer: Self-pay | Admitting: Orthopedic Surgery

## 2016-10-13 DIAGNOSIS — M79604 Pain in right leg: Secondary | ICD-10-CM

## 2016-10-22 ENCOUNTER — Encounter (INDEPENDENT_AMBULATORY_CARE_PROVIDER_SITE_OTHER): Payer: Self-pay | Admitting: Physical Medicine and Rehabilitation

## 2016-10-22 ENCOUNTER — Ambulatory Visit (INDEPENDENT_AMBULATORY_CARE_PROVIDER_SITE_OTHER): Admitting: Physical Medicine and Rehabilitation

## 2016-10-22 DIAGNOSIS — R202 Paresthesia of skin: Secondary | ICD-10-CM

## 2016-10-22 NOTE — Progress Notes (Signed)
Meagan Ryan - 60 y.o. female MRN [id number]  Date of birth: 07-Mar-1956  Office Visit Note: Visit Date: 10/22/2016 PCP: Gennette Pac, MD Referred by: Hulan Fess, MD  Subjective: Chief Complaint  Patient presents with  . Right Foot - Pain, Numbness  . Right Lower Leg - Pain, Numbness  . Right Knee - Pain   HPI: Meagan Ryan is a 60 year old female complaining of several months of predominantly right knee pain. Pain above knee posterior and also down back of lower leg to foot. Foot does not work like it's supposed to by end of day. Tripping a lot because of it. Numbness and tingling. Has neuropathy. Symptoms present for close to a year. She reports some left-sided symptoms but this is predominantly right-sided. Her right foot even though she says is weak is really more of an issue with she begins to have pain so she rotates her foot outwards or let's the leg fall into a position where it feels better. Her biggest complaint is posterior knee pain worse with walking. She has had a lumbar spine MRI which was essentially normal. She's also had a normal vascular study for DVT. She has not had an MRI of the knee. Dr. Marlou Sa is felt like this was not an intrinsic knee issue. She denies specific trauma. He denies she endorses neuropathy she has not had a prior electrodiagnostic study.     BROS Otherwise per HPI.  Assessment & Plan: Visit Diagnoses:  1. 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 Clinically this would not necessarily correspond with her symptoms. There is no significant electrodiagnostic evidence of any other focal nerve entrapment, lumbosacral plexopathy or lumbar radiculopathy in the right lower limb.   Recommendations: 1.  Follow-up with referring physician. 2.  Continue current management of symptoms. 3.   Suggest MRI of right knee to look for any posterior nerve impingement.     Meds & Orders: No orders of the defined types were placed in this encounter.   Orders Placed This Encounter  Procedures  . NCV with EMG (electromyography)    Follow-up: Return for Scheduled follow-up with Dr. Marlou Sa.   Procedures: No procedures performed  EMG & NCV Findings: Evaluation of the right fibular motor nerve showed reduced amplitude (1.4 mV).  The right superficial fibular sensory nerve showed prolonged distal peak latency (4.7 ms), reduced amplitude (4.1 V), and decreased conduction velocity (14 cm-Ant Lat Mall, 30 m/s).  The right sural sensory nerve showed prolonged distal peak latency (4.1 ms), reduced amplitude (3.4 V), and decreased conduction velocity (Calf-Lat Mall, 34 m/s).  All remaining nerves (as indicated in the following tables) were within normal limits.    All examined muscles (as indicated in the following table) showed no evidence of electrical instability.    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 Clinically this would not necessarily correspond with her symptoms. There is no significant electrodiagnostic evidence of any other focal nerve entrapment, lumbosacral plexopathy or lumbar radiculopathy in the right lower limb.   Recommendations: 1.  Follow-up with referring physician. 2.  Continue current management of symptoms. 3.  Suggest MRI of right knee to look for any posterior nerve impingement.   Nerve Conduction Studies Anti Sensory Summary Table   Stim Site NR Peak (ms) Norm  Peak (ms) P-T Amp (V) Norm P-T Amp Site1 Site2 Delta-P (ms) Dist (cm) Vel (m/s) Norm Vel (m/s)  Left Saphenous Anti Sensory (Ant Med Mall)  30.7C  14cm    4.0 <4.4 3.8 >2 14cm Ant Med Mall 4.0 0.0  >32  Right Sup Fibular Anti Sensory (Ant Lat Mall)  30.7C  14 cm    *4.7 <4.4 *4.1  >5.0 14 cm Ant Lat Mall 4.7 14.0 *30 >32  Site 2    3.9  10.1         Right Sural Anti Sensory (Lat Mall)  30.7C  Calf    *4.1 <4.0 *3.4 >5.0 Calf Lat Mall 4.1 14.0 *34 >35   Motor Summary Table   Stim Site NR Onset (ms) Norm Onset (ms) O-P Amp (mV) Norm O-P Amp Site1 Site2 Delta-0 (ms) Dist (cm) Vel (m/s) Norm Vel (m/s)  Right Fibular Motor (Ext Dig Brev)  30.8C  Ankle    6.1 <6.1 *1.4 >2.5 B Fib Ankle 7.8 31.0 40 >38  B Fib    13.9  1.1  Poplt B Fib 1.2 9.5 79 >40  Poplt    12.7  5.1         Right Tibial Motor (Abd Hall Brev)  30.9C  Ankle    4.1 <6.1 3.7 >3.0 Knee Ankle 7.9 37.0 47 >35  Knee    12.0  9.9          EMG   Side Muscle Nerve Root Ins Act Fibs Psw Amp Dur Poly Recrt Int Fraser Din Comment  Right AntTibialis Dp Br Peron L4-5 Nml Nml Nml Nml Nml 0 Nml Nml   Right Fibularis Longus  Sup Br Peron L5-S1 Nml Nml Nml Nml Nml 0 Nml Nml   Right MedGastroc Tibial S1-2 Nml Nml Nml Nml Nml 0 Nml Nml   Right VastusMed Femoral L2-4 Nml Nml Nml Nml Nml 0 Nml Nml   Right BicepsFemS Sciatic L5-S1 Nml Nml Nml Nml Nml 0 Nml Nml     Nerve Conduction Studies Anti Sensory Left/Right Comparison   Stim Site L Lat (ms) R Lat (ms) L-R Lat (ms) L Amp (V) R Amp (V) L-R Amp (%) Site1 Site2 L Vel (m/s) R Vel (m/s) L-R Vel (m/s)  Saphenous Anti Sensory (Ant Med Mall)  30.7C  14cm 4.0   3.8   14cm Ant Med Mall     Sup Fibular Anti Sensory (Ant Lat Mall)  30.7C  14 cm  *4.7   *4.1  14 cm Ant Lat Mall  *30   Site 2  3.9   10.1        Sural Anti Sensory (Lat Mall)  30.7C  Calf  *4.1   *3.4  Calf Lat Mall  *34    Motor Left/Right Comparison   Stim Site L Lat (ms) R Lat (ms) L-R Lat (ms) L Amp (mV) R Amp (mV) L-R Amp (%) Site1 Site2 L Vel (m/s) R Vel (m/s) L-R Vel (m/s)  Fibular Motor (Ext Dig Brev)  30.8C  Ankle  6.1   *1.4  B Fib Ankle  40   B Fib  13.9   1.1  Poplt B Fib  79   Poplt  12.7   5.1        Tibial Motor (Abd Hall Brev)  30.9C  Ankle  4.1   3.7  Knee Ankle  47   Knee  12.0    9.9  Clinical History: 10/09/2016 normal vascular study bilateral lower legs  Lspine MRI 07/25/2016 IMPRESSION: 1. Multilevel spondylosis and degenerative disc disease, without impingement to further explain the patient's symptoms. 2. Prior right nephrectomy.   She reports that she has never smoked. She has never used smokeless tobacco. No results for input(s): HGBA1C, LABURIC in the last 8760 hours.  Objective:  VS:  HT:    WT:   BMI:     BP:   HR: bpm  TEMP: ( )  RESP:  Physical Exam  Constitutional: She is oriented to person, place, and time.  Musculoskeletal:  She endorses swelling but does not appear to my last at any significant increased swelling side to side. There is no edema. There is good muscle bulk of the EDB muscles although may be a mild amount of atrophy bilaterally and symmetric. She ambulates without foot drop. She does want to lay with her right leg externally rotated and right foot turned inward.  Neurological: She is alert and oriented to person, place, and time. She displays normal reflexes. No sensory deficit. She exhibits normal muscle tone. Coordination normal.  She is somewhat reluctant to give active resistance but does seem to have full 5 out of 5 strength bilaterally with good EHL and dorsiflexion plantarflexion. She has downgoing toes bilaterally. She has no clonus.    Ortho Exam Imaging: No results found.  Past Medical/Family/Surgical/Social History: Medications & Allergies reviewed per EMR Patient Active Problem List   Diagnosis Date Noted  . Chronic systolic CHF (congestive heart failure), NYHA class 2 ([place]) 02/26/2016  . OSA (obstructive sleep apnea) 12/31/2015  . History of breast cancer in female 10/30/2015  . Ovarian cyst 10/10/2015  . Bilateral ovarian cysts 10/09/2015  . S/P oophorectomy 10/09/2015  . Morbid obesity with BMI of 40.0-44.9, adult ([place]) 08/08/2015  . Benign essential HTN 09/18/2014  . RBBB 09/18/2014  .  Incomplete RBBB   . DCM (dilated cardiomyopathy) ([place]) 04/24/2014  . Meniere disease 06/02/2012   Past Medical History:  Diagnosis Date  . Allergic rhinitis   . Asthma   . Breast cancer ([place]) 1996   left breast-stage II infiltrating ductal carcinoma, 3/8 axillary lymph nodes were positive for metastatic carcinoma, getting yearly mammogram and every other year MRI followed by oncologist Dr Benay Spice  . Chronic systolic CHF (congestive heart failure), NYHA class 2 ([place]) 02/26/2016  . Complication of anesthesia    WAKE UP WITH HEADACHE  . DCM (dilated cardiomyopathy) ([place])   . Dysuria 2011  . Family history of adverse reaction to anesthesia    "family wakes up with personality changes, but are very short term"  . Fatty liver CT 1/12   elevated LFT  . H/O actinic keratosis   . H/O allergic rhinitis   . H/O fibromyalgia   . H/O osteopenia 2009  . H/O peripheral neuropathy   . H/O varicose veins   . History of hyperlipidemia   . History of IBS   . Hx of colonic polyps   . Hx of migraines   . Hx of ovarian cyst   . HX: breast cancer left 1996  . Hyperlipidemia   . Hypertension   . Incomplete RBBB   . 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 Meniere disease   . OSA (obstructive sleep apnea) 12/31/2015   Very mild with AHI 6/hr but elevated RDI at 32/hr.  . Pelvic pain in female   . PMB (postmenopausal bleeding) 2011  . PMB (  postmenopausal bleeding) 2011  . PONV (postoperative nausea and vomiting)   . Pregnancy induced hypertension   . Right-sided lacunar infarction ([place])    MRI 1.2012  . Tubular adenoma of colon    Family History  Problem Relation Age of Onset  . Heart disease Mother     MI  . Stroke Mother   . Diabetes Mother   . Hypertension Mother   . Cancer Father     Prostate  . Stroke Father   . Cancer Sister     Colon   Past Surgical History:  Procedure Laterality Date  . gastroc muscle repair  2007  . LAPAROSCOPIC CHOLECYSTECTOMY    . MASTECTOMY  06/1995    Left - for infiltrating ductal carcinoma  . 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 ROBOTIC ASSISTED TOTAL HYSTERECTOMY WITH BILATERAL SALPINGO OOPHERECTOMY Bilateral 10/09/2015   Procedure: ROBOTIC ASSISTED BILATERAL SALPINGO OOPHORECTOMY ;  Surgeon: Everitt Amber, MD;  Location: [facility];  Service: Gynecology;  Laterality: Bilateral;  . tram surgery  1996  . TUBAL LIGATION     Social History   Occupational History  . Not on file.   Social History Main Topics  . Smoking status: Never Smoker  . Smokeless tobacco: Never Used  . Alcohol use No  . Drug use: No  . Sexual activity: Not on file

## 2016-10-22 NOTE — Procedures (Signed)
EMG & NCV Findings: Evaluation of the right fibular motor nerve showed reduced amplitude (1.4 mV).  The right superficial fibular sensory nerve showed prolonged distal peak latency (4.7 ms), reduced amplitude (4.1 V), and decreased conduction velocity (14 cm-Ant Lat Mall, 30 m/s).  The right sural sensory nerve showed prolonged distal peak latency (4.1 ms), reduced amplitude (3.4 V), and decreased conduction velocity (Calf-Lat Mall, 34 m/s).  All remaining nerves (as indicated in the following tables) were within normal limits.    All examined muscles (as indicated in the following table) showed no evidence of electrical instability.    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 Clinically this would not necessarily correspond with her symptoms. There is no significant electrodiagnostic evidence of any other focal nerve entrapment, lumbosacral plexopathy or lumbar radiculopathy in the right lower limb.   Recommendations: 1.  Follow-up with referring physician. 2.  Continue current management of symptoms. 3.  Suggest MRI of right knee to look for any posterior nerve impingement.   Nerve Conduction Studies Anti Sensory Summary Table   Stim Site NR Peak (ms) Norm Peak (ms) P-T Amp (V) Norm P-T Amp Site1 Site2 Delta-P (ms) Dist (cm) Vel (m/s) Norm Vel (m/s)  Left Saphenous Anti Sensory (Ant Med Mall)  30.7C  14cm    4.0 <4.4 3.8 >2 14cm Ant Med Mall 4.0 0.0  >32  Right Sup Fibular Anti Sensory (Ant Lat Mall)  30.7C  14 cm    *4.7 <4.4 *4.1 >5.0 14 cm Ant Lat Mall 4.7 14.0 *30 >32  Site 2    3.9  10.1         Right Sural Anti Sensory (Lat Mall)  30.7C  Calf    *4.1 <4.0 *3.4 >5.0 Calf Lat Mall 4.1 14.0 *34 >35   Motor Summary Table   Stim Site NR Onset (ms) Norm Onset (ms) O-P Amp (mV) Norm O-P Amp Site1 Site2 Delta-0 (ms) Dist (cm) Vel (m/s) Norm Vel (m/s)  Right  Fibular Motor (Ext Dig Brev)  30.8C  Ankle    6.1 <6.1 *1.4 >2.5 B Fib Ankle 7.8 31.0 40 >38  B Fib    13.9  1.1  Poplt B Fib 1.2 9.5 79 >40  Poplt    12.7  5.1         Right Tibial Motor (Abd Hall Brev)  30.9C  Ankle    4.1 <6.1 3.7 >3.0 Knee Ankle 7.9 37.0 47 >35  Knee    12.0  9.9          EMG   Side Muscle Nerve Root Ins Act Fibs Psw Amp Dur Poly Recrt Int Fraser Din Comment  Right AntTibialis Dp Br Peron L4-5 Nml Nml Nml Nml Nml 0 Nml Nml   Right Fibularis Longus  Sup Br Peron L5-S1 Nml Nml Nml Nml Nml 0 Nml Nml   Right MedGastroc Tibial S1-2 Nml Nml Nml Nml Nml 0 Nml Nml   Right VastusMed Femoral L2-4 Nml Nml Nml Nml Nml 0 Nml Nml   Right BicepsFemS Sciatic L5-S1 Nml Nml Nml Nml Nml 0 Nml Nml     Nerve Conduction Studies Anti Sensory Left/Right Comparison   Stim Site L Lat (ms) R Lat (ms) L-R Lat (ms) L Amp (V) R Amp (V) L-R Amp (%) Site1 Site2 L Vel (m/s) R Vel (m/s) L-R Vel (m/s)  Saphenous Anti Sensory (Ant Med Mall)  30.7C  14cm 4.0  3.8   14cm Ant Med Mall     Sup Fibular Anti Sensory (Ant Lat Mall)  30.7C  14 cm  *4.7   *4.1  14 cm Ant Lat Mall  *30   Site 2  3.9   10.1        Sural Anti Sensory (Lat Mall)  30.7C  Calf  *4.1   *3.4  Calf Lat Mall  *34    Motor Left/Right Comparison   Stim Site L Lat (ms) R Lat (ms) L-R Lat (ms) L Amp (mV) R Amp (mV) L-R Amp (%) Site1 Site2 L Vel (m/s) R Vel (m/s) L-R Vel (m/s)  Fibular Motor (Ext Dig Brev)  30.8C  Ankle  6.1   *1.4  B Fib Ankle  40   B Fib  13.9   1.1  Poplt B Fib  79   Poplt  12.7   5.1        Tibial Motor (Abd Hall Brev)  30.9C  Ankle  4.1   3.7  Knee Ankle  47   Knee  12.0   9.9

## 2016-10-25 ENCOUNTER — Other Ambulatory Visit: Payer: Self-pay | Admitting: Cardiology

## 2016-10-28 ENCOUNTER — Ambulatory Visit (INDEPENDENT_AMBULATORY_CARE_PROVIDER_SITE_OTHER): Admitting: Orthopedic Surgery

## 2016-10-28 ENCOUNTER — Encounter (INDEPENDENT_AMBULATORY_CARE_PROVIDER_SITE_OTHER): Payer: Self-pay | Admitting: Orthopedic Surgery

## 2016-10-28 DIAGNOSIS — M7989 Other specified soft tissue disorders: Secondary | ICD-10-CM

## 2016-10-28 DIAGNOSIS — R200 Anesthesia of skin: Secondary | ICD-10-CM | POA: Diagnosis not present

## 2016-10-28 DIAGNOSIS — M79661 Pain in right lower leg: Secondary | ICD-10-CM

## 2016-10-28 NOTE — Progress Notes (Signed)
Office Visit Note   Patient: Meagan Ryan           Date of Birth: [birth year]           MRN: [id number] Visit Date: 10/28/2016 Requested by: Hulan Fess, MD [place], [place] [id number] PCP: Gennette Pac, MD  Subjective: Chief Complaint  Patient presents with  . Right Leg - Pain, Edema, Numbness    HPI Meagan Ryan is a 60 year old female with right leg weakness with falls who presents for follow-up.  [facility] of [facility] she's had a right lower extremity nerve conduction study with Dr. Ernestina Patches.  Venous Doppler was negative for DVT.  She is still having the same symptoms.  She describes weakness and occasional falling from lack of dorsiflexion strength in the right leg.  Denies any headaches.  She does report occasional numbness and tingling with operative her foot.  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  Interestingly she has no left-sided symptoms.              Review of Systems All systems reviewed are negative as they relate to the chief complaint within the history of present illness.  Patient denies  fevers or chills.    Assessment & Plan: Visit Diagnoses:  1. Pain and swelling of right lower leg   2. Right leg numbness     Plan: Impression is right leg weakness unclear etiology with negative MRI scan non-informative nerve conduction study and negative venous Doppler.  Plan is MRI scan of the knee to look for some type of lesion which may be compressing the peroneal nerve.  I don't really palpate anything in this area but she does have problem bending the right knee.  Radiographs fairly unremarkable.  I'll see her back after that MRI scan of the knee.  IfThat's negative we may need this consider neurological referral to look at the cerebellum.  Follow-Up Instructions: Return for after MRI.   Orders:  Orders Placed This Encounter  Procedures  . MR Knee Right w/o contrast   No orders of the defined types were placed in this  encounter.     Procedures: No procedures performed   Clinical Data: No additional findings.  Objective: Vital Signs: There were no vitals taken for this visit.  Physical Exam   Constitutional: Patient appears well-developed HEENT:  Head: Normocephalic Eyes:EOM are normal Neck: Normal range of motion Cardiovascular: Normal rate Pulmonary/chest: Effort normal Neurologic: Patient is alert Skin: Skin is warm Psychiatric: Patient has normal mood and affect    Ortho Exam examination of the right leg demonstrates no groin pain with internal/external rotation of the leg I will detect much in the way of palpable masses posteriorly around the knee.  She does have limited flexion on the right compared to the left.  Ankle dorsiflexion and eversion strength is slightly weaker.  Plantarflexion strength is generally symmetric even though she has had gastroc recession on the right-hand side.  Pedal pulses palpable.  No knee effusion is present.  No other masses lymph adenopathy or skin changes noted in the right knee region  Specialty Comments:  No specialty comments available.  Imaging: No results found.   PMFS History: Patient Active Problem List   Diagnosis Date Noted  . Pain and swelling of right lower leg 10/28/2016  . Right leg numbness 10/28/2016  . Chronic systolic CHF (congestive heart failure), NYHA class 2 ([place]) 02/26/2016  . OSA (obstructive sleep apnea) 12/31/2015  . History of breast  cancer in female 10/30/2015  . Ovarian cyst 10/10/2015  . Bilateral ovarian cysts 10/09/2015  . S/P oophorectomy 10/09/2015  . Morbid obesity with BMI of 40.0-44.9, adult ([place]) 08/08/2015  . Benign essential HTN 09/18/2014  . RBBB 09/18/2014  . Incomplete RBBB   . DCM (dilated cardiomyopathy) ([place]) 04/24/2014  . Meniere disease 06/02/2012   Past Medical History:  Diagnosis Date  . Allergic rhinitis   . Asthma   . Breast cancer ([place]) 1996   left breast-stage II infiltrating  ductal carcinoma, 3/8 axillary lymph nodes were positive for metastatic carcinoma, getting yearly mammogram and every other year MRI followed by oncologist Dr Benay Spice  . Chronic systolic CHF (congestive heart failure), NYHA class 2 ([place]) 02/26/2016  . Complication of anesthesia    WAKE UP WITH HEADACHE  . DCM (dilated cardiomyopathy) ([place])   . Dysuria 2011  . Family history of adverse reaction to anesthesia    "family wakes up with personality changes, but are very short term"  . Fatty liver CT 1/12   elevated LFT  . H/O actinic keratosis   . H/O allergic rhinitis   . H/O fibromyalgia   . H/O osteopenia 2009  . H/O peripheral neuropathy   . H/O varicose veins   . History of hyperlipidemia   . History of IBS   . Hx of colonic polyps   . Hx of migraines   . Hx of ovarian cyst   . HX: breast cancer left 1996  . Hyperlipidemia   . Hypertension   . Incomplete RBBB   . 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 Meniere disease   . OSA (obstructive sleep apnea) 12/31/2015   Very mild with AHI 6/hr but elevated RDI at 32/hr.  . Pelvic pain in female   . PMB (postmenopausal bleeding) 2011  . PMB (postmenopausal bleeding) 2011  . PONV (postoperative nausea and vomiting)   . Pregnancy induced hypertension   . Right-sided lacunar infarction ([place])    MRI 1.2012  . Tubular adenoma of colon     Family History  Problem Relation Age of Onset  . Heart disease Mother     MI  . Stroke Mother   . Diabetes Mother   . Hypertension Mother   . Cancer Father     Prostate  . Stroke Father   . Cancer Sister     Colon    Past Surgical History:  Procedure Laterality Date  . gastroc muscle repair  2007  . LAPAROSCOPIC CHOLECYSTECTOMY    . MASTECTOMY  06/1995   Left - for infiltrating ductal carcinoma  . 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 ROBOTIC ASSISTED TOTAL HYSTERECTOMY WITH BILATERAL SALPINGO OOPHERECTOMY Bilateral 10/09/2015   Procedure: ROBOTIC ASSISTED BILATERAL  SALPINGO OOPHORECTOMY ;  Surgeon: Everitt Amber, MD;  Location: [facility];  Service: Gynecology;  Laterality: Bilateral;  . tram surgery  1996  . TUBAL LIGATION     Social History   Occupational History  . Not on file.   Social History Main Topics  . Smoking status: Never Smoker  . Smokeless tobacco: Never Used  . Alcohol use No  . Drug use: No  . Sexual activity: Not on file

## 2016-11-22 ENCOUNTER — Other Ambulatory Visit

## 2016-11-29 ENCOUNTER — Ambulatory Visit
Admission: RE | Admit: 2016-11-29 | Discharge: 2016-11-29 | Disposition: A | Discharge status: Home / Self Care | Source: Ambulatory Visit | Attending: Orthopedic Surgery | Admitting: Orthopedic Surgery

## 2016-11-29 DIAGNOSIS — R200 Anesthesia of skin: Secondary | ICD-10-CM

## 2016-12-28 ENCOUNTER — Other Ambulatory Visit: Payer: Self-pay | Admitting: Family Medicine

## 2016-12-28 DIAGNOSIS — Z1231 Encounter for screening mammogram for malignant neoplasm of breast: Secondary | ICD-10-CM

## 2017-01-12 ENCOUNTER — Ambulatory Visit
Admission: RE | Admit: 2017-01-12 | Discharge: 2017-01-12 | Disposition: A | Discharge status: Home / Self Care | Payer: TRICARE For Life (TFL) | Source: Ambulatory Visit | Attending: Family Medicine | Admitting: Family Medicine

## 2017-01-12 ENCOUNTER — Other Ambulatory Visit: Payer: Self-pay | Admitting: Family Medicine

## 2017-01-12 DIAGNOSIS — Z1231 Encounter for screening mammogram for malignant neoplasm of breast: Secondary | ICD-10-CM

## 2017-01-24 ENCOUNTER — Other Ambulatory Visit: Payer: Self-pay | Admitting: Cardiology

## 2017-02-15 ENCOUNTER — Other Ambulatory Visit: Payer: Self-pay | Admitting: Cardiology

## 2017-03-15 ENCOUNTER — Encounter: Payer: Self-pay | Admitting: Cardiology

## 2017-03-16 ENCOUNTER — Encounter: Payer: Self-pay | Admitting: Cardiology

## 2017-03-16 ENCOUNTER — Ambulatory Visit (INDEPENDENT_AMBULATORY_CARE_PROVIDER_SITE_OTHER): Payer: TRICARE For Life (TFL) | Admitting: Cardiology

## 2017-03-16 ENCOUNTER — Encounter (INDEPENDENT_AMBULATORY_CARE_PROVIDER_SITE_OTHER): Payer: Self-pay

## 2017-03-16 VITALS — BP 126/84 | HR 78 | Ht 64.0 in | Wt 241.5 lb

## 2017-03-16 DIAGNOSIS — I420 Dilated cardiomyopathy: Secondary | ICD-10-CM | POA: Diagnosis not present

## 2017-03-16 DIAGNOSIS — I10 Essential (primary) hypertension: Secondary | ICD-10-CM | POA: Diagnosis not present

## 2017-03-16 DIAGNOSIS — I5042 Chronic combined systolic (congestive) and diastolic (congestive) heart failure: Secondary | ICD-10-CM

## 2017-03-16 DIAGNOSIS — Z6841 Body Mass Index (BMI) 40.0 and over, adult: Secondary | ICD-10-CM

## 2017-03-16 LAB — BASIC METABOLIC PANEL
BUN/Creatinine Ratio: 11 — ABNORMAL LOW (ref 12–28)
BUN: 10 mg/dL (ref 8–27)
CALCIUM: 9.2 mg/dL (ref 8.7–10.3)
CO2: 25 mmol/L (ref 18–29)
CREATININE: 0.93 mg/dL (ref 0.57–1.00)
Chloride: 99 mmol/L (ref 96–106)
GFR calc Af Amer: 76 mL/min/{1.73_m2} (ref >59)
GFR calc non Af Amer: 66 mL/min/{1.73_m2} (ref >59)
GLUCOSE: 132 mg/dL — AB (ref 65–99)
Potassium: 4 mmol/L (ref 3.5–5.2)
Sodium: 140 mmol/L (ref 134–144)

## 2017-03-16 NOTE — Progress Notes (Signed)
Cardiology Office Note    Date:  03/16/2017   ID:  Meagan Ryan, DOB 01-08-1956, MRN [id number]  PCP:  Hulan Fess, MD  Cardiologist:  Fransico Him, MD   Chief Complaint  Patient presents with  . Cardiomyopathy  . Hypertension  . Congestive Heart Failure    History of Present Illness:  Meagan Ryan is a 61 y.o. 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  She presents today for followup and  is doing well. She denies any chest pain or pressure. She has chronic SOB which is stable. She recently started walking for exercise and has noticed some DOE but has gained 9 lbs in the past year.  She denies any LE edema, PND, orthopnea, palpitations, dizziness or syncope.  .   Past Medical History:  Diagnosis Date  . Allergic rhinitis   . Asthma   . Breast cancer ([place]) 1996   left breast-stage II infiltrating ductal carcinoma, 3/8 axillary lymph nodes were positive for metastatic carcinoma, getting yearly mammogram and every other year MRI followed by oncologist Dr Benay Spice  . Chronic systolic CHF (congestive heart failure), NYHA class 2 ([place]) 02/26/2016  . Complication of anesthesia    WAKE UP WITH HEADACHE  . DCM (dilated cardiomyopathy) ([place])   . Dysuria 2011  . Family history of adverse reaction to anesthesia    "family wakes up with personality changes, but are very short term"  . Fatty liver CT 1/12   elevated LFT  . H/O actinic keratosis   . H/O allergic rhinitis   . H/O fibromyalgia   . H/O osteopenia 2009  . H/O peripheral neuropathy   . H/O varicose veins   . History of IBS   . Hx of colonic polyps   . Hx of migraines   . Hx of ovarian cyst   . Hyperlipidemia   . Hypertension   . Incomplete RBBB   . 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 OSA (obstructive sleep apnea) 12/31/2015   Very mild with AHI 6/hr but elevated RDI at 32/hr.  . Pelvic pain in female   . PMB (postmenopausal bleeding) 2011  . PONV (postoperative nausea and vomiting)     . Pregnancy induced hypertension   . Right-sided lacunar infarction ([place])    MRI 1.2012  . Tubular adenoma of colon     Past Surgical History:  Procedure Laterality Date  . gastroc muscle repair  2007  . LAPAROSCOPIC CHOLECYSTECTOMY    . MASTECTOMY  06/1995   Left - for infiltrating ductal carcinoma  . 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 ROBOTIC ASSISTED TOTAL HYSTERECTOMY WITH BILATERAL SALPINGO OOPHERECTOMY Bilateral 10/09/2015   Procedure: ROBOTIC ASSISTED BILATERAL SALPINGO OOPHORECTOMY ;  Surgeon: Everitt Amber, MD;  Location: [facility];  Service: Gynecology;  Laterality: Bilateral;  . tram surgery  1996  . TUBAL LIGATION      Current Medications: Current Meds  Medication Sig  . ALPRAZolam (XANAX) 0.5 MG tablet Take 0.25 mg by mouth 2 (two) times daily.   Marland Kitchen aspirin 81 MG tablet Take 81 mg by mouth every morning.   Marland Kitchen azelastine (ASTELIN) 0.1 % nasal spray Place 1 spray into both nostrils 2 (two) times daily as needed for rhinitis or allergies.   . carvedilol (COREG) 3.125 MG tablet TAKE 1 TABLET TWICE A DAY WITH MEALS  . cetirizine (ZYRTEC) 10 MG tablet Take 10 mg by mouth every  morning.   . Cholecalciferol (VITAMIN D-3 PO) Take 1 tablet by mouth 2 (two) times daily. Reported on 10/30/2015  . CORLANOR 5 MG TABS tablet TAKE 1 TABLET TWICE A DAY WITH MEALS  . DULoxetine (CYMBALTA) 30 MG capsule Take 30 mg by mouth at bedtime.   Marland Kitchen ENTRESTO 24-26 MG TAKE 1 TABLET TWICE A DAY  . fluticasone (FLONASE) 50 MCG/ACT nasal spray Place 1 spray into both nostrils every morning.   . furosemide (LASIX) 20 MG tablet TAKE 1 TABLET AS NEEDED (FOR INCREASE OF 6 POUNDS OF DRY WEIGHT. TAKE WITH EXTRA POTASSIUM SUPPLEMENT)  . gabapentin (NEURONTIN) 300 MG capsule Take 300 mg by mouth 2 (two) times daily.  . meclizine (ANTIVERT) 12.5 MG tablet Take 12.5 mg by mouth 3 (three) times daily as needed for dizziness.  . niacin (NIASPAN) 500 MG CR tablet Take 500 mg by mouth every morning.    . nortriptyline (PAMELOR) 25 MG capsule Take 25 mg by mouth at bedtime as needed for sleep. For sleep  . Omega-3 Fatty Acids (FISH OIL) 1200 MG CAPS Take 1,400 mg by mouth 2 (two) times daily.  Marland Kitchen omeprazole (PRILOSEC) 20 MG capsule Take 20 mg by mouth daily.  . potassium chloride (K-DUR) 10 MEQ tablet Take 10 mEq by mouth every morning.   . raloxifene (EVISTA) 60 MG tablet Take 60 mg by mouth every morning.     Allergies:   Amlodipine; Norvasc [amlodipine besylate]; Cephalexin; Codeine; Gluten meal; Noroxin [norfloxacin]; Sulfa antibiotics; Sulfamethoxazole; Latex; and Penicillins   Social History   Social History  . Marital status: Widowed    Spouse name: N/A  . Number of children: N/A  . Years of education: N/A   Social History Main Topics  . Smoking status: Never Smoker  . Smokeless tobacco: Never Used  . Alcohol use No  . Drug use: No  . Sexual activity: Not Asked   Other Topics Concern  . 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 Wears Ear plugs   Marital Status: Widowed   1 daughter     Family History:  The patient's family history includes Cancer in her father and sister; Diabetes in her mother; Heart disease in her mother; Hypertension in her mother; Stroke in her father and mother.   ROS:   Please see the history of present illness.    ROS All other systems reviewed and are negative.  No flowsheet data found.     PHYSICAL EXAM:   VS:  BP 126/84   Pulse 78   Ht 5\' 4"  (1.626 m)   Wt 241 lb 8 oz (109.5 kg)   SpO2 96%   BMI 41.45 kg/m    GEN: Well nourished, well developed, in no acute distress  HEENT: normal  Neck: no JVD, carotid bruits, or masses Cardiac: RRR; no murmurs, rubs, or gallops,no edema.  Intact distal pulses bilaterally.  Respiratory:  clear to  auscultation bilaterally, normal work of breathing GI: soft, nontender, nondistended, + BS MS: no deformity or atrophy  Skin: warm and dry, no rash Neuro:  Alert and Oriented x 3, Strength and sensation are intact Psych: euthymic mood, full affect  Wt Readings from Last 3 Encounters:  03/16/17 241 lb 8 oz (109.5 kg)  03/20/16 233 lb (105.7 kg)  03/10/16 232 lb (105.2 kg)      Studies/Labs Reviewed:   EKG:  EKG is not ordered today.    Recent Labs: No results found for requested labs within last 8760 hours.   Lipid Panel No results found for: CHOL, TRIG, HDL, CHOLHDL, VLDL, LDLCALC, LDLDIRECT  Additional studies/ records that were reviewed today include:  none    ASSESSMENT:    1. Chronic combined systolic and diastolic CHF, NYHA class 2 ([place])   2. DCM (dilated cardiomyopathy) ([place])   3. Benign essential HTN   4. Morbid obesity with BMI of 40.0-44.9, adult ([place])      PLAN:  In order of problems listed above:  1. Chronic combined systolic/diastolic CHF NYHA class 2 - she appears euvolemic on exam today and weight is stable.  She will continue on carvedilol, Entresto, Lasix, Corlanor.  I will check a BMET today.   2. DCM - EF now normalized at 60-6% by echo 07/2016.   3. HTN - BP controlled on current meds.  She will continue on carvedilol 4. Morbid obesity - I have encouraged to continue to walk and cut back on carbs and portions and avoid eating late at night.    Medication Adjustments/Labs and Tests Ordered: Current medicines are reviewed at length with the patient today.  Concerns regarding medicines are outlined above.  Medication changes, Labs and Tests ordered today are listed in the Patient Instructions below.  There are no Patient Instructions on file for this visit.   Signed, Fransico Him, MD  03/16/2017 9:57 AM    [address] [place], [place],   [address] Phone: [phone]; Fax: [phone]

## 2017-03-16 NOTE — Patient Instructions (Signed)

## 2017-04-01 ENCOUNTER — Other Ambulatory Visit: Payer: Self-pay | Admitting: Cardiology

## 2017-04-01 ENCOUNTER — Other Ambulatory Visit: Payer: Self-pay | Admitting: Sports Medicine

## 2017-04-05 ENCOUNTER — Other Ambulatory Visit: Payer: Self-pay | Admitting: *Deleted

## 2017-04-05 MED ORDER — SACUBITRIL-VALSARTAN 24-26 MG PO TABS: 1.0000 | ORAL_TABLET | Freq: Two times a day (BID) | ORAL | 3 refills | Status: DC

## 2017-04-05 MED ORDER — IVABRADINE HCL 5 MG PO TABS: 5.0000 mg | ORAL_TABLET | Freq: Two times a day (BID) | ORAL | 3 refills | Status: DC

## 2017-10-12 IMAGING — NM NM MYOCAR MULTI W/ SPECT
3 series · 18 of 18 positions shown · non-contrast
Comparison: none

[Series 1: stress · 6.51mm/px · 6 of 64 frames shown (1 of 2)]
[frame 6/64]
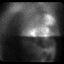
[frame 16/64]
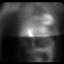
[frame 27/64]
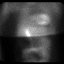
[frame 38/64]
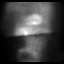
[frame 48/64]
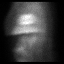
[frame 59/64]
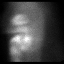

[Series 1: stress · 6.51mm/px · 6 of 468 frames shown (2 of 2)]
[frame 40/468  full-range]
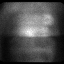
[frame 118/468  full-range]
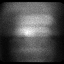
[frame 196/468  full-range]
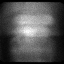
[frame 274/468  full-range]
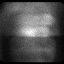
[frame 352/468  full-range]
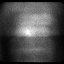
[frame 430/468  full-range]
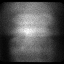

[Series 2: rest · 6.51mm/px · 6 of 64 frames shown]
[frame 6/64]
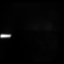
[frame 16/64]
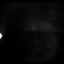
[frame 27/64]
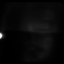
[frame 38/64]
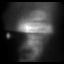
[frame 48/64]
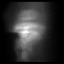
[frame 59/64]
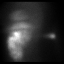

[18 of 18 positions shown; findings below may reference images not displayed]

Canned report from images found in remote index.

Refer to host system for actual result text.

## 2017-11-22 ENCOUNTER — Other Ambulatory Visit: Payer: Self-pay | Admitting: Cardiology

## 2017-12-14 ENCOUNTER — Emergency Department (HOSPITAL_COMMUNITY)

## 2017-12-14 ENCOUNTER — Other Ambulatory Visit: Payer: Self-pay

## 2017-12-14 ENCOUNTER — Observation Stay (HOSPITAL_COMMUNITY)

## 2017-12-14 ENCOUNTER — Observation Stay (HOSPITAL_COMMUNITY)
Admission: EM | Admit: 2017-12-14 | Discharge: 2017-12-14 | Disposition: A | Discharge status: Home / Self Care | Attending: Internal Medicine | Admitting: Internal Medicine

## 2017-12-14 ENCOUNTER — Encounter (HOSPITAL_COMMUNITY): Payer: Self-pay | Admitting: Emergency Medicine

## 2017-12-14 ENCOUNTER — Observation Stay (HOSPITAL_BASED_OUTPATIENT_CLINIC_OR_DEPARTMENT_OTHER)

## 2017-12-14 DIAGNOSIS — I427 Cardiomyopathy due to drug and external agent: Secondary | ICD-10-CM | POA: Insufficient documentation

## 2017-12-14 DIAGNOSIS — I5042 Chronic combined systolic (congestive) and diastolic (congestive) heart failure: Secondary | ICD-10-CM | POA: Diagnosis not present

## 2017-12-14 DIAGNOSIS — I420 Dilated cardiomyopathy: Secondary | ICD-10-CM | POA: Diagnosis present

## 2017-12-14 DIAGNOSIS — R9431 Abnormal electrocardiogram [ECG] [EKG]: Secondary | ICD-10-CM | POA: Diagnosis not present

## 2017-12-14 DIAGNOSIS — R55 Syncope and collapse: Principal | ICD-10-CM | POA: Insufficient documentation

## 2017-12-14 DIAGNOSIS — Z853 Personal history of malignant neoplasm of breast: Secondary | ICD-10-CM | POA: Diagnosis not present

## 2017-12-14 DIAGNOSIS — J45909 Unspecified asthma, uncomplicated: Secondary | ICD-10-CM | POA: Diagnosis not present

## 2017-12-14 DIAGNOSIS — Z79899 Other long term (current) drug therapy: Secondary | ICD-10-CM | POA: Diagnosis not present

## 2017-12-14 DIAGNOSIS — Z91040 Latex allergy status: Secondary | ICD-10-CM | POA: Diagnosis not present

## 2017-12-14 DIAGNOSIS — G4733 Obstructive sleep apnea (adult) (pediatric): Secondary | ICD-10-CM | POA: Diagnosis not present

## 2017-12-14 DIAGNOSIS — I10 Essential (primary) hypertension: Secondary | ICD-10-CM | POA: Diagnosis present

## 2017-12-14 DIAGNOSIS — I110 Hypertensive heart disease with heart failure: Secondary | ICD-10-CM | POA: Insufficient documentation

## 2017-12-14 DIAGNOSIS — H8109 Meniere's disease, unspecified ear: Secondary | ICD-10-CM | POA: Insufficient documentation

## 2017-12-14 LAB — CBC WITH DIFFERENTIAL/PLATELET
BASOS ABS: 0 10*3/uL (ref 0.0–0.1)
Basophils Relative: 1 %
EOS ABS: 0.1 10*3/uL (ref 0.0–0.7)
EOS PCT: 2 %
HCT: 42.5 % (ref 36.0–46.0)
HEMOGLOBIN: 13.9 g/dL (ref 12.0–15.0)
LYMPHS ABS: 1.8 10*3/uL (ref 0.7–4.0)
Lymphocytes Relative: 33 %
MCH: 30 pg (ref 26.0–34.0)
MCHC: 32.7 g/dL (ref 30.0–36.0)
MCV: 91.8 fL (ref 78.0–100.0)
Monocytes Absolute: 0.4 10*3/uL (ref 0.1–1.0)
Monocytes Relative: 7 %
NEUTROS PCT: 57 %
Neutro Abs: 3 10*3/uL (ref 1.7–7.7)
PLATELETS: 189 10*3/uL (ref 150–400)
RBC: 4.63 MIL/uL (ref 3.87–5.11)
RDW: 14.1 % (ref 11.5–15.5)
WBC: 5.4 10*3/uL (ref 4.0–10.5)

## 2017-12-14 LAB — URINALYSIS, ROUTINE W REFLEX MICROSCOPIC
BILIRUBIN URINE: NEGATIVE
Bacteria, UA: NONE SEEN
Glucose, UA: NEGATIVE mg/dL
Hgb urine dipstick: NEGATIVE
KETONES UR: NEGATIVE mg/dL
Nitrite: NEGATIVE
PH: 7 (ref 5.0–8.0)
PROTEIN: NEGATIVE mg/dL
SQUAMOUS EPITHELIAL / LPF: NONE SEEN
Specific Gravity, Urine: 1.008 (ref 1.005–1.030)

## 2017-12-14 LAB — BASIC METABOLIC PANEL
Anion gap: 11 (ref 5–15)
BUN: 10 mg/dL (ref 6–20)
CHLORIDE: 104 mmol/L (ref 101–111)
CO2: 22 mmol/L (ref 22–32)
CREATININE: 0.98 mg/dL (ref 0.44–1.00)
Calcium: 9.4 mg/dL (ref 8.9–10.3)
GFR calc non Af Amer: >60 mL/min (ref >60)
Glucose, Bld: 109 mg/dL — ABNORMAL HIGH (ref 65–99)
POTASSIUM: 3.8 mmol/L (ref 3.5–5.1)
SODIUM: 137 mmol/L (ref 135–145)

## 2017-12-14 LAB — HIV ANTIBODY (ROUTINE TESTING W REFLEX): HIV Screen 4th Generation wRfx: NONREACTIVE

## 2017-12-14 LAB — CBC
HEMATOCRIT: 41.9 % (ref 36.0–46.0)
HEMOGLOBIN: 13.7 g/dL (ref 12.0–15.0)
MCH: 29.9 pg (ref 26.0–34.0)
MCHC: 32.7 g/dL (ref 30.0–36.0)
MCV: 91.5 fL (ref 78.0–100.0)
Platelets: 206 10*3/uL (ref 150–400)
RBC: 4.58 MIL/uL (ref 3.87–5.11)
RDW: 14.1 % (ref 11.5–15.5)
WBC: 5.5 10*3/uL (ref 4.0–10.5)

## 2017-12-14 LAB — I-STAT CHEM 8, ED
BUN: 10 mg/dL (ref 6–20)
CHLORIDE: 102 mmol/L (ref 101–111)
Calcium, Ion: 1.16 mmol/L (ref 1.15–1.40)
Creatinine, Ser: 0.9 mg/dL (ref 0.44–1.00)
Glucose, Bld: 118 mg/dL — ABNORMAL HIGH (ref 65–99)
HEMATOCRIT: 42 % (ref 36.0–46.0)
Hemoglobin: 14.3 g/dL (ref 12.0–15.0)
POTASSIUM: 3.8 mmol/L (ref 3.5–5.1)
SODIUM: 138 mmol/L (ref 135–145)
TCO2: 25 mmol/L (ref 22–32)

## 2017-12-14 LAB — ECHOCARDIOGRAM COMPLETE
Height: 63 in
Weight: 3527.36 oz

## 2017-12-14 LAB — I-STAT TROPONIN, ED: TROPONIN I, POC: 0.01 ng/mL (ref 0.00–0.08)

## 2017-12-14 MED ORDER — ACETAMINOPHEN 650 MG RE SUPP: 650.0000 mg | Freq: Four times a day (QID) | RECTAL | Status: DC | PRN

## 2017-12-14 MED ORDER — GADOBENATE DIMEGLUMINE 529 MG/ML IV SOLN
20.0000 mL | Freq: Once | INTRAVENOUS | Status: AC
  Administered 2017-12-14: 20 mL via INTRAVENOUS

## 2017-12-14 MED ORDER — NORTRIPTYLINE HCL 25 MG PO CAPS: 25.0000 mg | ORAL_CAPSULE | Freq: Every evening | ORAL | Status: DC | PRN

## 2017-12-14 MED ORDER — ONDANSETRON HCL 4 MG/2ML IJ SOLN: 4.0000 mg | Freq: Four times a day (QID) | INTRAMUSCULAR | Status: DC | PRN

## 2017-12-14 MED ORDER — MECLIZINE HCL 25 MG PO TABS: 12.5000 mg | ORAL_TABLET | Freq: Three times a day (TID) | ORAL | Status: DC | PRN

## 2017-12-14 MED ORDER — DULOXETINE HCL 30 MG PO CPEP: 30.0000 mg | ORAL_CAPSULE | Freq: Every day | ORAL | Status: DC

## 2017-12-14 MED ORDER — SACUBITRIL-VALSARTAN 24-26 MG PO TABS
1.0000 | ORAL_TABLET | Freq: Two times a day (BID) | ORAL | Status: DC
Start: 2017-12-14 — End: 2017-12-14
  Administered 2017-12-14: 1 via ORAL
  Filled 2017-12-14 (×2): qty 1

## 2017-12-14 MED ORDER — GABAPENTIN 300 MG PO CAPS
300.0000 mg | ORAL_CAPSULE | Freq: Two times a day (BID) | ORAL | Status: DC
  Administered 2017-12-14: 300 mg via ORAL
  Filled 2017-12-14: qty 1

## 2017-12-14 MED ORDER — PERFLUTREN LIPID MICROSPHERE
1.0000 mL | INTRAVENOUS | Status: AC | PRN
  Filled 2017-12-14: qty 10

## 2017-12-14 MED ORDER — ONDANSETRON HCL 4 MG PO TABS: 4.0000 mg | ORAL_TABLET | Freq: Four times a day (QID) | ORAL | Status: DC | PRN

## 2017-12-14 MED ORDER — PERFLUTREN LIPID MICROSPHERE
INTRAVENOUS | Status: AC
  Administered 2017-12-14: 2 mL via INTRAVENOUS
  Filled 2017-12-14: qty 10

## 2017-12-14 MED ORDER — LORATADINE 10 MG PO TABS
10.0000 mg | ORAL_TABLET | Freq: Every day | ORAL | Status: DC
  Administered 2017-12-14: 10 mg via ORAL
  Filled 2017-12-14: qty 1

## 2017-12-14 MED ORDER — ALPRAZOLAM 0.25 MG PO TABS
0.2500 mg | ORAL_TABLET | Freq: Two times a day (BID) | ORAL | Status: DC
  Administered 2017-12-14: 0.25 mg via ORAL
  Filled 2017-12-14: qty 1

## 2017-12-14 MED ORDER — ACETAMINOPHEN 325 MG PO TABS: 650.0000 mg | ORAL_TABLET | Freq: Four times a day (QID) | ORAL | Status: DC | PRN

## 2017-12-14 MED ORDER — PANTOPRAZOLE SODIUM 40 MG PO TBEC
40.0000 mg | DELAYED_RELEASE_TABLET | Freq: Every day | ORAL | Status: DC
  Administered 2017-12-14: 40 mg via ORAL
  Filled 2017-12-14: qty 1

## 2017-12-14 MED ORDER — CARVEDILOL 3.125 MG PO TABS
3.1250 mg | ORAL_TABLET | Freq: Two times a day (BID) | ORAL | Status: DC
  Administered 2017-12-14: 3.125 mg via ORAL
  Filled 2017-12-14 (×2): qty 1

## 2017-12-14 MED ORDER — FLUTICASONE PROPIONATE 50 MCG/ACT NA SUSP
1.0000 | Freq: Every morning | NASAL | Status: DC
  Filled 2017-12-14: qty 16

## 2017-12-14 MED ORDER — NIACIN ER (ANTIHYPERLIPIDEMIC) 500 MG PO TBCR
500.0000 mg | EXTENDED_RELEASE_TABLET | Freq: Every morning | ORAL | Status: DC
  Administered 2017-12-14: 500 mg via ORAL
  Filled 2017-12-14: qty 1

## 2017-12-14 MED ORDER — OMEGA-3-ACID ETHYL ESTERS 1 G PO CAPS
1000.0000 mg | ORAL_CAPSULE | Freq: Two times a day (BID) | ORAL | Status: DC
  Administered 2017-12-14: 1000 mg via ORAL
  Filled 2017-12-14 (×2): qty 1

## 2017-12-14 MED ORDER — IVABRADINE HCL 5 MG PO TABS
5.0000 mg | ORAL_TABLET | Freq: Two times a day (BID) | ORAL | Status: DC
  Administered 2017-12-14: 5 mg via ORAL
  Filled 2017-12-14 (×2): qty 1

## 2017-12-14 MED ORDER — ENOXAPARIN SODIUM 40 MG/0.4ML ~~LOC~~ SOLN
40.0000 mg | SUBCUTANEOUS | Status: DC
  Administered 2017-12-14: 40 mg via SUBCUTANEOUS
  Filled 2017-12-14: qty 0.4

## 2017-12-14 NOTE — Evaluation (Signed)
Physical Therapy Evaluation Patient Details Name: Meagan Ryan MRN: [id number] DOB: 1956-01-13 Today's Date: 12/14/2017   History of Present Illness  Pt is a 62 y/o female admitted secondary to dizziness and 3 episodes of syncope. CT and MRI negative for acute abnormality. EEG normal. PMH inludes HTN, OSA, meniere's disease in L ear, breast cancer s/p mastectomy, CHF, and fibromyalgia.   Clinical Impression  Pt admitted secondary to problem above with deficits below. Pt slightly unsteady during gait requiring min guard to supervision, however, pt reports she is unsteady at baseline secondary to Meniere's disease. Pt asymptomatic throughout gait and VSS. Pt lives with her daughter and will have intermittent assist. Will continue to follow acutely to maximize functional mobility independence and safety.     Follow Up Recommendations No PT follow up;Supervision for mobility/OOB(Refusing outpatient PT )    Equipment Recommendations  None recommended by PT(refusing cane )    Recommendations for Other Services       Precautions / Restrictions Precautions Precautions: Fall Restrictions Weight Bearing Restrictions: No      Mobility  Bed Mobility Overal bed mobility: Modified Independent             General bed mobility comments: Increased time, however, no assist required.   Transfers Overall transfer level: Needs assistance Equipment used: None Transfers: Sit to/from Stand Sit to Stand: Supervision         General transfer comment: Supervision for safety. Increased time to perform.   Ambulation/Gait Ambulation/Gait assistance: Min guard;Supervision Ambulation Distance (Feet): 150 Feet Assistive device: None Gait Pattern/deviations: Step-through pattern;Decreased stride length Gait velocity: Decreased  Gait velocity interpretation: Below normal speed for age/gender General Gait Details: Slow, slightly unsteady gait, however, no LOB noted. Pt reports she is unsteady at  baseline given meniere's disease. Reports ambulation is close to baseline. Educated about use of cane to increase stability, however, pt refusing at this time.   Stairs            Wheelchair Mobility    Modified Rankin (Stroke Patients Only)       Balance Overall balance assessment: Needs assistance Sitting-balance support: No upper extremity supported;Feet supported Sitting balance-Leahy Scale: Good     Standing balance support: No upper extremity supported;During functional activity Standing balance-Leahy Scale: Fair                               Pertinent Vitals/Pain Pain Assessment: No/denies pain    Home Living Family/patient expects to be discharged to:: Private residence Living Arrangements: Children Available Help at Discharge: Family;Available PRN/intermittently Type of Home: Apartment Home Access: Ramped entrance     Home Layout: Two level Home Equipment: None      Prior Function Level of Independence: Independent         Comments: Still driving; picks up grandaughter from school      Hand Dominance   Dominant Hand: Right    Extremity/Trunk Assessment   Upper Extremity Assessment Upper Extremity Assessment: Overall WFL for tasks assessed    Lower Extremity Assessment Lower Extremity Assessment: Generalized weakness    Cervical / Trunk Assessment Cervical / Trunk Assessment: Normal  Communication   Communication: No difficulties  Cognition Arousal/Alertness: Awake/alert Behavior During Therapy: WFL for tasks assessed/performed Overall Cognitive Status: Within Functional Limits for tasks assessed  General Comments General comments (skin integrity, edema, etc.): Asymptomatic during gait and VSS. Pt's daughter present in room. Educated about outpatient PT to address balance deficits, however, pt refusing at this time.     Exercises     Assessment/Plan    PT  Assessment Patient needs continued PT services  PT Problem List Decreased strength;Decreased balance;Decreased mobility       PT Treatment Interventions Gait training;Functional mobility training;Therapeutic activities;Balance training;Neuromuscular re-education;Therapeutic exercise;Patient/family education;DME instruction    PT Goals (Current goals can be found in the Care Plan section)  Acute Rehab PT Goals Patient Stated Goal: to go home today  PT Goal Formulation: With patient Time For Goal Achievement: 12/28/17 Potential to Achieve Goals: Good    Frequency Min 3X/week   Barriers to discharge        Co-evaluation               AM-PAC PT "6 Clicks" Daily Activity  Outcome Measure Difficulty turning over in bed (including adjusting bedclothes, sheets and blankets)?: None Difficulty moving from lying on back to sitting on the side of the bed? : A Little Difficulty sitting down on and standing up from a chair with arms (e.g., wheelchair, bedside commode, etc,.)?: A Little Help needed moving to and from a bed to chair (including a wheelchair)?: A Little Help needed walking in hospital room?: A Little Help needed climbing 3-5 steps with a railing? : A Lot 6 Click Score: 18    End of Session Equipment Utilized During Treatment: Gait belt Activity Tolerance: Patient tolerated treatment well Patient left: in bed;with call bell/phone within reach;with family/visitor present Nurse Communication: Mobility status PT Visit Diagnosis: Unsteadiness on feet (R26.81)    Time: 1445-1510 PT Time Calculation (min) (ACUTE ONLY): 25 min   Charges:   PT Evaluation $PT Eval Low Complexity: 1 Low PT Treatments $Gait Training: 8-22 mins   PT G Codes:        Leighton Ruff, PT, DPT  Acute Rehabilitation Services  Pager: [phone]   Rudean Hitt 12/14/2017, 3:16 PM

## 2017-12-14 NOTE — Progress Notes (Signed)
EEG complete - results pending 

## 2017-12-14 NOTE — Discharge Instructions (Signed)
Near-Syncope °Near-syncope is when you suddenly get weak or dizzy, or you feel like you might pass out (faint). During an episode of near-syncope, you may: °· Feel dizzy or light-headed. °· Feel sick to your stomach (nauseous). °· See all white or all black. °· Have cold, clammy skin. ° °If you passed out, get help right away.Call your local emergency services (911 in the [place] Do not drive yourself to the hospital. °Follow these instructions at home: °Pay attention to any changes in your symptoms. Take these actions to help with your condition: °· Have someone stay with you until you feel stable. °· Do not drive, use machinery, or play sports until your doctor says it is okay. °· Keep all follow-up visits as told by your doctor. This is important. °· If you start to feel like you might pass out, lie down right away and raise (elevate) your feet above the level of your heart. Breathe deeply and steadily. Wait until all of the symptoms are gone. °· Drink enough fluid to keep your pee (urine) clear or pale yellow. °· If you are taking blood pressure or heart medicine, get up slowly and spend many minutes getting ready to sit and then stand. This can help with dizziness. °· Take over-the-counter and prescription medicines only as told by your doctor. ° °Get help right away if: °· You have a very bad headache. °· You have unusual pain in your chest, tummy, or back. °· You are bleeding from your mouth or rectum. °· You have black or tarry poop (stool). °· You have a very fast or uneven heartbeat (palpitations). °· You pass out one time or more than once. °· You have jerky movements that you cannot control (seizure). °· You are confused. °· You have trouble walking. °· You are very weak. °· You have vision problems. °These symptoms may be an emergency. Do not wait to see if the symptoms will go away. Get medical help right away. Call your local emergency services (911 in the [place] Do not drive yourself to the  hospital. °This information is not intended to replace advice given to you by your health care provider. Make sure you discuss any questions you have with your health care provider. °Document Released: 04/12/2008 Document Revised: 04/01/2016 Document Reviewed: 07/09/2015 °Elsevier Interactive Patient Education © 2017 [employer] ° °

## 2017-12-14 NOTE — Progress Notes (Signed)
Pt admitted from ED with family, alert ox3, oriented to unit and routines, safety discussed and bed alarm on, pt and family verbalize understanding

## 2017-12-14 NOTE — Progress Notes (Signed)
  Echocardiogram 2D Echocardiogram has been performed.  Meagan Ryan F 12/14/2017, 10:59 AM

## 2017-12-14 NOTE — H&P (Signed)
History and Physical    Meagan Ryan:[id number] DOB: 08/15/1956 DOA: 12/14/2017  PCP: Hulan Fess, MD  Patient coming from: Home.  Chief Complaint: Loss of consciousness.  HPI: Meagan Ryan is a 62 y.o. female with history of breast cancer in remission, asthma, chemotherapy-induced cardiomyopathy last EF measured in 2017 was 60-65% with grade 2 diastolic dysfunction was witnessed to have 3 episodes of syncope.  Patient's daughter states that patient started complaining of pressure in the head around 10:30 PM last night in the bed.  Following which patient had a brief episode of syncope and patient woke up with right arm jerking.  Appears mildly confused but did not have any incontinence of urine or tongue bite.  Similar episode happened again-ablated twice.  Patient headache improved by the time patient reached ER.  ED Course: In the ER patient appeared nonfocal.  CT head was unremarkable.  EKG was showing RBBB with LVH.  On-call neurologist was consulted by the ER physician and neurologist recommended syncope workup.  Review of Systems: As per HPI, rest all negative.   Past Medical History:  Diagnosis Date  . Allergic rhinitis   . Asthma   . Breast cancer ([place]) 1996   left breast-stage II infiltrating ductal carcinoma, 3/8 axillary lymph nodes were positive for metastatic carcinoma, getting yearly mammogram and every other year MRI followed by oncologist Dr Benay Spice  . Chronic combined systolic and diastolic heart failure ([place]) 02/26/2016  . Complication of anesthesia    WAKE UP WITH HEADACHE  . DCM (dilated cardiomyopathy) ([place])    EF normalized at 60-65% on echo 07/2016  . Dysuria 2011  . Family history of adverse reaction to anesthesia    "family wakes up with personality changes, but are very short term"  . Fatty liver CT 1/12   elevated LFT  . H/O actinic keratosis   . H/O allergic rhinitis   . H/O fibromyalgia   . H/O osteopenia 2009  . H/O peripheral neuropathy     . H/O varicose veins   . History of IBS   . Hx of colonic polyps   . Hx of migraines   . Hx of ovarian cyst   . Hyperlipidemia   . Hypertension   . Incomplete RBBB   . 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 OSA (obstructive sleep apnea) 12/31/2015   Very mild with AHI 6/hr but elevated RDI at 32/hr.  . Pelvic pain in female   . PMB (postmenopausal bleeding) 2011  . PONV (postoperative nausea and vomiting)   . Pregnancy induced hypertension   . Right-sided lacunar infarction    MRI 1.2012  . Tubular adenoma of colon     Past Surgical History:  Procedure Laterality Date  . gastroc muscle repair  2007  . LAPAROSCOPIC CHOLECYSTECTOMY    . MASTECTOMY  06/1995   Left - for infiltrating ductal carcinoma  . 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 ROBOTIC ASSISTED TOTAL HYSTERECTOMY WITH BILATERAL SALPINGO OOPHERECTOMY Bilateral 10/09/2015   Procedure: ROBOTIC ASSISTED BILATERAL SALPINGO OOPHORECTOMY ;  Surgeon: Everitt Amber, MD;  Location: [facility];  Service: Gynecology;  Laterality: Bilateral;  . tram surgery  1996  . TUBAL LIGATION       reports that  has never smoked. she has never used smokeless tobacco. She reports that she does not drink alcohol or use drugs.  Allergies  Allergen Reactions  . Amlodipine Shortness Of Breath  .  Norvasc [Amlodipine Besylate] Shortness Of Breath    Breathing difficulties  . Cephalexin Other (See Comments) and Nausea And Vomiting    Fever got to 105 High temperature  . Codeine Nausea And Vomiting  . Gluten Meal Other (See Comments)    Gluten free diet required  . Noroxin [Norfloxacin]     Pt do not remember  . Sulfa Antibiotics Nausea And Vomiting  . Sulfamethoxazole Nausea And Vomiting  . Latex Itching and Rash    redness  . Penicillins Rash    Has patient had a PCN reaction causing immediate rash, facial/tongue/throat swelling, SOB or lightheadedness with hypotension:No Has patient had a PCN reaction causing severe  rash involving mucus membranes or skin necrosis: Yes Has patient had a PCN reaction that required hospitalization No Has patient had a PCN reaction occurring within the last 10 years: No If all of the above answers are "NO", then may proceed with Cephalosporin use.     Family History  Problem Relation Age of Onset  . Heart disease Mother        MI  . Stroke Mother   . Diabetes Mother   . Hypertension Mother   . Cancer Father        Prostate  . Stroke Father   . Cancer Sister        Colon    Prior to Admission medications   Medication Sig Start Date End Date Taking? Authorizing Provider  ALPRAZolam Duanne Moron) 0.5 MG tablet Take 0.25 mg by mouth 2 (two) times daily.    Yes [provider]  azelastine (ASTELIN) 0.1 % nasal spray Place 1 spray into both nostrils 2 (two) times daily as needed for rhinitis or allergies.  07/21/15  Yes [provider]  carvedilol (COREG) 3.125 MG tablet Take 1 tablet (3.125 mg total) by mouth 2 (two) times daily with a meal. 04/01/17  Yes Turner, Traci R, MD  cetirizine (ZYRTEC) 10 MG tablet Take 10 mg by mouth every morning.    Yes [provider]  Cholecalciferol (VITAMIN D-3 PO) Take 1 tablet by mouth 2 (two) times daily. Reported on 10/30/2015   Yes [provider]  DULoxetine (CYMBALTA) 30 MG capsule Take 30 mg by mouth at bedtime.    Yes [provider]  fluticasone (FLONASE) 50 MCG/ACT nasal spray Place 1 spray into both nostrils every morning.  06/26/15  Yes [provider]  furosemide (LASIX) 20 MG tablet TAKE 1 TABLET AS NEEDED (FOR INCREASE OF 6 POUNDS OF DRY WEIGHT. TAKE WITH EXTRA POTASSIUM SUPPLEMENT) 11/22/17  Yes Turner, Eber Hong, MD  gabapentin (NEURONTIN) 300 MG capsule Take 300 mg by mouth 2 (two) times daily. 02/14/16  Yes [provider]  ivabradine (CORLANOR) 5 MG TABS tablet Take 1 tablet (5 mg total) by mouth 2 (two) times daily with a meal. 04/05/17  Yes Turner, Eber Hong, MD   meclizine (ANTIVERT) 12.5 MG tablet Take 12.5 mg by mouth 3 (three) times daily as needed for dizziness.   Yes [provider]  niacin (NIASPAN) 500 MG CR tablet Take 500 mg by mouth every morning.    Yes [provider]  nortriptyline (PAMELOR) 25 MG capsule Take 25 mg by mouth at bedtime as needed for sleep. For sleep   Yes [provider]  Omega-3 Fatty Acids (FISH OIL) 1200 MG CAPS Take 1,400 mg by mouth 2 (two) times daily.   Yes [provider]  omeprazole (PRILOSEC) 20 MG capsule Take 20 mg  by mouth daily. 12/29/15  Yes [provider]  potassium chloride (K-DUR) 10 MEQ tablet Take 10 mEq by mouth daily as needed (when taking lasix).    Yes [provider]  raloxifene (EVISTA) 60 MG tablet Take 60 mg by mouth every morning.    Yes [provider]  sacubitril-valsartan (ENTRESTO) 24-26 MG Take 1 tablet by mouth 2 (two) times daily. 04/05/17  Yes Sueanne Margarita, MD    Physical Exam: Vitals:   12/14/17 0300 12/14/17 0315 12/14/17 0330 12/14/17 0345  BP: 131/80 128/79 115/82 130/84  Pulse: 62 65 65 71  Resp: 13 18 14 19   Temp:      TempSrc:      SpO2: 96% 95% 96% 95%      Constitutional: Moderately built and nourished. Vitals:   12/14/17 0300 12/14/17 0315 12/14/17 0330 12/14/17 0345  BP: 131/80 128/79 115/82 130/84  Pulse: 62 65 65 71  Resp: 13 18 14 19   Temp:      TempSrc:      SpO2: 96% 95% 96% 95%   Eyes: Anicteric no pallor. ENMT: No discharge from the ears eyes nose or mouth. Neck: No mass felt.  No neck rigidity. Respiratory: No rhonchi or crepitations. Cardiovascular: S1-S2 heard no murmurs appreciated. Abdomen: Soft nontender bowel sounds present. Musculoskeletal: No edema.  No joint effusion. Skin: No rash.  Skin appears warm. Neurologic: Alert awake oriented to time place and person.  Moves all extremities. Psychiatric: Appears normal.  Normal affect.   Labs on Admission: I have personally  reviewed following labs and imaging studies  CBC: Recent Labs  Lab 12/14/17 0138 12/14/17 0222  WBC 5.4  --   NEUTROABS 3.0  --   HGB 13.9 14.3  HCT 42.5 42.0  MCV 91.8  --   PLT 189  --    Basic Metabolic Panel: Recent Labs  Lab 12/14/17 0222  NA 138  K 3.8  CL 102  GLUCOSE 118*  BUN 10  CREATININE 0.90   GFR: CrCl cannot be calculated (Unknown ideal weight.). Liver Function Tests: No results for input(s): AST, ALT, ALKPHOS, BILITOT, PROT, ALBUMIN in the last 168 hours. No results for input(s): LIPASE, AMYLASE in the last 168 hours. No results for input(s): AMMONIA in the last 168 hours. Coagulation Profile: No results for input(s): INR, PROTIME in the last 168 hours. Cardiac Enzymes: No results for input(s): CKTOTAL, CKMB, CKMBINDEX, TROPONINI in the last 168 hours. BNP (last 3 results) No results for input(s): PROBNP in the last 8760 hours. HbA1C: No results for input(s): HGBA1C in the last 72 hours. CBG: No results for input(s): GLUCAP in the last 168 hours. Lipid Profile: No results for input(s): CHOL, HDL, LDLCALC, TRIG, CHOLHDL, LDLDIRECT in the last 72 hours. Thyroid Function Tests: No results for input(s): TSH, T4TOTAL, FREET4, T3FREE, THYROIDAB in the last 72 hours. Anemia Panel: No results for input(s): VITAMINB12, FOLATE, FERRITIN, TIBC, IRON, RETICCTPCT in the last 72 hours. Urine analysis:    Component Value Date/Time   COLORURINE STRAW (A) 12/14/2017 0144   APPEARANCEUR CLEAR 12/14/2017 0144   LABSPEC 1.008 12/14/2017 0144   PHURINE 7.0 12/14/2017 0144   GLUCOSEU NEGATIVE 12/14/2017 0144   HGBUR NEGATIVE 12/14/2017 0144   BILIRUBINUR NEGATIVE 12/14/2017 0144   KETONESUR NEGATIVE 12/14/2017 0144   PROTEINUR NEGATIVE 12/14/2017 0144   UROBILINOGEN 1.0 08/28/2015 0907   NITRITE NEGATIVE 12/14/2017 0144   LEUKOCYTESUR MODERATE (A) 12/14/2017 0144   Sepsis Labs: @LABRCNTIP (procalcitonin:4,lacticidven:4) )No results found for this  or any  previous visit (from the past 240 hour(s)).   Radiological Exams on Admission: Dg Chest 2 View  Result Date: 12/14/2017 CLINICAL DATA:  Subacute onset of dizziness and syncope. Pounding headache. EXAM: CHEST  2 VIEW COMPARISON:  Chest radiograph performed 10/10/2015 FINDINGS: The lungs are well-aerated and clear. There is no evidence of focal opacification, pleural effusion or pneumothorax. The heart is borderline normal in size. No acute osseous abnormalities are seen. Postoperative change is noted about the upper abdomen. IMPRESSION: No acute cardiopulmonary process seen. Electronically Signed   By: Garald Balding M.D.   On: 12/14/2017 02:08   Ct Head Wo Contrast  Result Date: 12/14/2017 CLINICAL DATA:  Acute onset of dizziness and syncope. Pounding headache. EXAM: CT HEAD WITHOUT CONTRAST TECHNIQUE: Contiguous axial images were obtained from the base of the skull through the vertex without intravenous contrast. COMPARISON:  CT of the head performed 11/28/2015 FINDINGS: Brain: No evidence of acute infarction, hemorrhage, hydrocephalus, extra-axial collection or mass lesion / mass effect. Prominence of the sulci reflects mild cortical volume loss. Mild cerebellar atrophy is noted. Mild periventricular white matter change likely reflects small vessel ischemic microangiopathy. The brainstem and fourth ventricle are within normal limits. The basal ganglia are unremarkable in appearance. The cerebral hemispheres demonstrate grossly normal gray-white differentiation. No mass effect or midline shift is seen. Vascular: No hyperdense vessel or unexpected calcification. Skull: There is no evidence of fracture; visualized osseous structures are unremarkable in appearance. Sinuses/Orbits: The orbits are within normal limits. Mild mucosal thickening is noted at the right maxillary sinus. The remaining paranasal sinuses and mastoid air cells are well-aerated. Other: No significant soft tissue abnormalities are seen.  IMPRESSION: 1. No acute intracranial pathology seen on CT. 2. Mild cortical volume loss and scattered small vessel ischemic microangiopathy. 3. Mild mucosal thickening at the right maxillary sinus. Electronically Signed   By: Garald Balding M.D.   On: 12/14/2017 02:09    EKG: Independently reviewed.  Normal sinus rhythm with QTC of 473 ms and shows RBBB and LVH.  Assessment/Plan Principal Problem:   Syncope Active Problems:   Meniere disease   DCM (dilated cardiomyopathy) ([facility])   Benign essential HTN   OSA (obstructive sleep apnea)    1. Syncope -cause not clear.  Patient was on the bed when the incident happened.  Since patient had some jerking spell of the right upper extremity following the incident will check EEG and MRI brain.  Will check 2D echo closely monitor in telemetry. 2. History of chemotherapy-induced cardiomyopathy last EF was 60-65% with grade 2 diastolic dysfunction in 2706.  On Entresto Coreg and Corlanor. 3. History of Mnire's disease. 4. Hypertension we will continue present medications. 5. Sleep apnea on CPAP. 6. History of breast cancer in remission.   DVT prophylaxis: Lovenox. Code Status: Full code. Family Communication: Patient's daughter. Disposition Plan: Home. Consults called: None. Admission status: Observation.   Rise Patience MD [facility] Pager [phone].  If 7PM-7AM, please contact night-coverage [email or web address] Password [email or web address]  12/14/2017, 4:12 AM

## 2017-12-14 NOTE — ED Notes (Signed)
Patient transported to X-ray 

## 2017-12-14 NOTE — ED Provider Notes (Signed)
[facility] EMERGENCY DEPARTMENT Provider Note   CSN: [id number] Arrival date & time: 12/14/17  0126     History   Chief Complaint Chief Complaint  Patient presents with  . Loss of Consciousness    HPI Meagan Ryan is a 62 y.o. female.  The history is provided by the patient.  Loss of Consciousness   This is a new problem. The current episode started 3 to 5 hours ago. Episode frequency: three times witnessed twice with shaking of the RUE.  Once was awake with shaking once was unresponsive  The problem has not changed since onset.She lost consciousness for a period of 1 to 5 minutes. The problem is associated with normal activity. Pertinent negatives include abdominal pain, chest pain, diaphoresis, dizziness, focal weakness, palpitations and weakness. Associated symptoms comments: Strange loud noise in the head . She has tried nothing for the symptoms. The treatment provided no relief. Her past medical history does not include CVA.    Past Medical History:  Diagnosis Date  . Allergic rhinitis   . Asthma   . Breast cancer ([place]) 1996   left breast-stage II infiltrating ductal carcinoma, 3/8 axillary lymph nodes were positive for metastatic carcinoma, getting yearly mammogram and every other year MRI followed by oncologist Dr Benay Spice  . Chronic combined systolic and diastolic heart failure ([place]) 02/26/2016  . Complication of anesthesia    WAKE UP WITH HEADACHE  . DCM (dilated cardiomyopathy) ([place])    EF normalized at 60-65% on echo 07/2016  . Dysuria 2011  . Family history of adverse reaction to anesthesia    "family wakes up with personality changes, but are very short term"  . Fatty liver CT 1/12   elevated LFT  . H/O actinic keratosis   . H/O allergic rhinitis   . H/O fibromyalgia   . H/O osteopenia 2009  . H/O peripheral neuropathy   . H/O varicose veins   . History of IBS   . Hx of colonic polyps   . Hx of migraines   . Hx of ovarian cyst   .  Hyperlipidemia   . Hypertension   . Incomplete RBBB   . 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 OSA (obstructive sleep apnea) 12/31/2015   Very mild with AHI 6/hr but elevated RDI at 32/hr.  . Pelvic pain in female   . PMB (postmenopausal bleeding) 2011  . PONV (postoperative nausea and vomiting)   . Pregnancy induced hypertension   . Right-sided lacunar infarction    MRI 1.2012  . Tubular adenoma of colon     Patient Active Problem List   Diagnosis Date Noted  . Pain and swelling of right lower leg 10/28/2016  . Right leg numbness 10/28/2016  . Chronic systolic CHF (congestive heart failure), NYHA class 2 ([place]) 02/26/2016  . OSA (obstructive sleep apnea) 12/31/2015  . History of breast cancer in female 10/30/2015  . Ovarian cyst 10/10/2015  . Bilateral ovarian cysts 10/09/2015  . S/P oophorectomy 10/09/2015  . Morbid obesity with BMI of 40.0-44.9, adult ([place]) 08/08/2015  . Benign essential HTN 09/18/2014  . RBBB 09/18/2014  . Incomplete RBBB   . DCM (dilated cardiomyopathy) ([place]) 04/24/2014  . Meniere disease 06/02/2012    Past Surgical History:  Procedure Laterality Date  . gastroc muscle repair  2007  . LAPAROSCOPIC CHOLECYSTECTOMY    . MASTECTOMY  06/1995   Left - for infiltrating ductal carcinoma  . 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 ROBOTIC ASSISTED TOTAL HYSTERECTOMY WITH BILATERAL SALPINGO OOPHERECTOMY Bilateral 10/09/2015   Procedure: ROBOTIC ASSISTED BILATERAL SALPINGO OOPHORECTOMY ;  Surgeon: Everitt Amber, MD;  Location: [facility] ORS;  Service: Gynecology;  Laterality: Bilateral;  . tram surgery  1996  . TUBAL LIGATION      OB History    Gravida Para Term Preterm AB Living   1 1 1     1    SAB TAB Ectopic Multiple Live Births           1       Home Medications    Prior to Admission medications   Medication Sig Start Date End Date Taking? Authorizing Provider  ALPRAZolam Duanne Moron) 0.5 MG tablet Take 0.25 mg by mouth 2 (two) times daily.     Yes [provider]  azelastine (ASTELIN) 0.1 % nasal spray Place 1 spray into both nostrils 2 (two) times daily as needed for rhinitis or allergies.  07/21/15  Yes [provider]  carvedilol (COREG) 3.125 MG tablet Take 1 tablet (3.125 mg total) by mouth 2 (two) times daily with a meal. 04/01/17  Yes Turner, Traci R, MD  cetirizine (ZYRTEC) 10 MG tablet Take 10 mg by mouth every morning.    Yes [provider]  Cholecalciferol (VITAMIN D-3 PO) Take 1 tablet by mouth 2 (two) times daily. Reported on 10/30/2015   Yes [provider]  DULoxetine (CYMBALTA) 30 MG capsule Take 30 mg by mouth at bedtime.    Yes [provider]  fluticasone (FLONASE) 50 MCG/ACT nasal spray Place 1 spray into both nostrils every morning.  06/26/15  Yes [provider]  furosemide (LASIX) 20 MG tablet TAKE 1 TABLET AS NEEDED (FOR INCREASE OF 6 POUNDS OF DRY WEIGHT. TAKE WITH EXTRA POTASSIUM SUPPLEMENT) 11/22/17  Yes Turner, Eber Hong, MD  gabapentin (NEURONTIN) 300 MG capsule Take 300 mg by mouth 2 (two) times daily. 02/14/16  Yes [provider]  ivabradine (CORLANOR) 5 MG TABS tablet Take 1 tablet (5 mg total) by mouth 2 (two) times daily with a meal. 04/05/17  Yes Turner, Eber Hong, MD  meclizine (ANTIVERT) 12.5 MG tablet Take 12.5 mg by mouth 3 (three) times daily as needed for dizziness.   Yes [provider]  niacin (NIASPAN) 500 MG CR tablet Take 500 mg by mouth every morning.    Yes [provider]  nortriptyline (PAMELOR) 25 MG capsule Take 25 mg by mouth at bedtime as needed for sleep. For sleep   Yes [provider]  Omega-3 Fatty Acids (FISH OIL) 1200 MG CAPS Take 1,400 mg by mouth 2 (two) times daily.   Yes [provider]  omeprazole (PRILOSEC) 20 MG capsule Take 20 mg by mouth daily. 12/29/15  Yes [provider]  potassium chloride (K-DUR) 10 MEQ tablet Take 10 mEq by mouth daily as needed (when taking lasix).     Yes [provider]  raloxifene (EVISTA) 60 MG tablet Take 60 mg by mouth every morning.    Yes [provider]  sacubitril-valsartan (ENTRESTO) 24-26 MG Take 1 tablet by mouth 2 (two) times daily. 04/05/17  Yes Sueanne Margarita, MD    Family History Family History  Problem Relation Age of Onset  . Heart disease Mother        MI  . Stroke Mother   . Diabetes Mother   . Hypertension Mother   . Cancer Father  Prostate  . Stroke Father   . Cancer Sister        Colon    Social History Social History   Tobacco Use  . Smoking status: Never Smoker  . Smokeless tobacco: Never Used  Substance Use Topics  . Alcohol use: No  . Drug use: No     Allergies   Amlodipine; Norvasc [amlodipine besylate]; Cephalexin; Codeine; Gluten meal; Noroxin [norfloxacin]; Sulfa antibiotics; Sulfamethoxazole; Latex; and Penicillins   Review of Systems Review of Systems  Constitutional: Negative for diaphoresis.  Respiratory: Negative for shortness of breath.   Cardiovascular: Positive for syncope. Negative for chest pain, palpitations and leg swelling.  Gastrointestinal: Negative for abdominal pain.  Neurological: Positive for syncope. Negative for dizziness, focal weakness, facial asymmetry, speech difficulty, weakness and numbness.  All other systems reviewed and are negative.    Physical Exam Updated Vital Signs BP 133/78   Pulse 70   Temp 97.8 F (36.6 C) (Oral)   Resp 13   SpO2 97%   Physical Exam  Constitutional: She is oriented to person, place, and time. She appears well-developed and well-nourished.  HENT:  Head: Normocephalic and atraumatic.  Nose: Nose normal.  Mouth/Throat: No oropharyngeal exudate.  Eyes: Conjunctivae are normal. Pupils are equal, round, and reactive to light.  Neck: Normal range of motion. Neck supple. No JVD present.  Cardiovascular: Normal rate, regular rhythm, normal heart sounds and intact distal pulses.  Pulmonary/Chest:  Effort normal and breath sounds normal. No stridor. She has no wheezes. She has no rales.  Abdominal: Soft. Bowel sounds are normal. She exhibits no mass. There is no tenderness. There is no rebound and no guarding.  Musculoskeletal: Normal range of motion.  Neurological: She is alert and oriented to person, place, and time. She displays normal reflexes. No cranial nerve deficit.  Skin: Skin is warm and dry. Capillary refill takes less than 2 seconds.  Psychiatric: She has a normal mood and affect.  Nursing note and vitals reviewed.    ED Treatments / Results  Labs (all labs ordered are listed, but only abnormal results are displayed)  Results for orders placed or performed during the hospital encounter of 12/14/17  CBC with Differential/Platelet  Result Value Ref Range   WBC 5.4 4.0 - 10.5 K/uL   RBC 4.63 3.87 - 5.11 MIL/uL   Hemoglobin 13.9 12.0 - 15.0 g/dL   HCT 42.5 36.0 - 46.0 %   MCV 91.8 78.0 - 100.0 fL   MCH 30.0 26.0 - 34.0 pg   MCHC 32.7 30.0 - 36.0 g/dL   RDW 14.1 11.5 - 15.5 %   Platelets 189 150 - 400 K/uL   Neutrophils Relative % 57 %   Neutro Abs 3.0 1.7 - 7.7 K/uL   Lymphocytes Relative 33 %   Lymphs Abs 1.8 0.7 - 4.0 K/uL   Monocytes Relative 7 %   Monocytes Absolute 0.4 0.1 - 1.0 K/uL   Eosinophils Relative 2 %   Eosinophils Absolute 0.1 0.0 - 0.7 K/uL   Basophils Relative 1 %   Basophils Absolute 0.0 0.0 - 0.1 K/uL  Urinalysis, Routine w reflex microscopic  Result Value Ref Range   Color, Urine STRAW (A) YELLOW   APPearance CLEAR CLEAR   Specific Gravity, Urine 1.008 1.005 - 1.030   pH 7.0 5.0 - 8.0   Glucose, UA NEGATIVE NEGATIVE mg/dL   Hgb urine dipstick NEGATIVE NEGATIVE   Bilirubin Urine NEGATIVE NEGATIVE   Ketones, ur NEGATIVE NEGATIVE mg/dL   Protein,  ur NEGATIVE NEGATIVE mg/dL   Nitrite NEGATIVE NEGATIVE   Leukocytes, UA MODERATE (A) NEGATIVE   RBC / HPF 0-5 0 - 5 RBC/hpf   WBC, UA 6-30 0 - 5 WBC/hpf   Bacteria, UA NONE SEEN NONE SEEN    Squamous Epithelial / LPF NONE SEEN NONE SEEN  I-Stat Chem 8, ED  Result Value Ref Range   Sodium 138 135 - 145 mmol/L   Potassium 3.8 3.5 - 5.1 mmol/L   Chloride 102 101 - 111 mmol/L   BUN 10 6 - 20 mg/dL   Creatinine, Ser 0.90 0.44 - 1.00 mg/dL   Glucose, Bld 118 (H) 65 - 99 mg/dL   Calcium, Ion 1.16 1.15 - 1.40 mmol/L   TCO2 25 22 - 32 mmol/L   Hemoglobin 14.3 12.0 - 15.0 g/dL   HCT 42.0 36.0 - 46.0 %  I-stat troponin, ED  Result Value Ref Range   Troponin i, poc 0.01 0.00 - 0.08 ng/mL   Comment 3           Dg Chest 2 View  Result Date: 12/14/2017 CLINICAL DATA:  Subacute onset of dizziness and syncope. Pounding headache. EXAM: CHEST  2 VIEW COMPARISON:  Chest radiograph performed 10/10/2015 FINDINGS: The lungs are well-aerated and clear. There is no evidence of focal opacification, pleural effusion or pneumothorax. The heart is borderline normal in size. No acute osseous abnormalities are seen. Postoperative change is noted about the upper abdomen. IMPRESSION: No acute cardiopulmonary process seen. Electronically Signed   By: Garald Balding M.D.   On: 12/14/2017 02:08   Ct Head Wo Contrast  Result Date: 12/14/2017 CLINICAL DATA:  Acute onset of dizziness and syncope. Pounding headache. EXAM: CT HEAD WITHOUT CONTRAST TECHNIQUE: Contiguous axial images were obtained from the base of the skull through the vertex without intravenous contrast. COMPARISON:  CT of the head performed 11/28/2015 FINDINGS: Brain: No evidence of acute infarction, hemorrhage, hydrocephalus, extra-axial collection or mass lesion / mass effect. Prominence of the sulci reflects mild cortical volume loss. Mild cerebellar atrophy is noted. Mild periventricular white matter change likely reflects small vessel ischemic microangiopathy. The brainstem and fourth ventricle are within normal limits. The basal ganglia are unremarkable in appearance. The cerebral hemispheres demonstrate grossly normal gray-white differentiation. No  mass effect or midline shift is seen. Vascular: No hyperdense vessel or unexpected calcification. Skull: There is no evidence of fracture; visualized osseous structures are unremarkable in appearance. Sinuses/Orbits: The orbits are within normal limits. Mild mucosal thickening is noted at the right maxillary sinus. The remaining paranasal sinuses and mastoid air cells are well-aerated. Other: No significant soft tissue abnormalities are seen. IMPRESSION: 1. No acute intracranial pathology seen on CT. 2. Mild cortical volume loss and scattered small vessel ischemic microangiopathy. 3. Mild mucosal thickening at the right maxillary sinus. Electronically Signed   By: Garald Balding M.D.   On: 12/14/2017 02:09    EKG  EKG Interpretation  Date/Time:  Wednesday December 14 2017 01:45:22 EST Ventricular Rate:  76 PR Interval:    QRS Duration: 127 QT Interval:  420 QTC Calculation: 473 R Axis:   -58 Text Interpretation:  Sinus rhythm Prolonged PR interval Right bundle branch block Left ventricular hypertrophy Confirmed by Dory Horn) on 12/14/2017 1:56:35 AM       Radiology Dg Chest 2 View  Result Date: 12/14/2017 CLINICAL DATA:  Subacute onset of dizziness and syncope. Pounding headache. EXAM: CHEST  2 VIEW COMPARISON:  Chest radiograph performed 10/10/2015 FINDINGS: The lungs  are well-aerated and clear. There is no evidence of focal opacification, pleural effusion or pneumothorax. The heart is borderline normal in size. No acute osseous abnormalities are seen. Postoperative change is noted about the upper abdomen. IMPRESSION: No acute cardiopulmonary process seen. Electronically Signed   By: Garald Balding M.D.   On: 12/14/2017 02:08   Ct Head Wo Contrast  Result Date: 12/14/2017 CLINICAL DATA:  Acute onset of dizziness and syncope. Pounding headache. EXAM: CT HEAD WITHOUT CONTRAST TECHNIQUE: Contiguous axial images were obtained from the base of the skull through the vertex without  intravenous contrast. COMPARISON:  CT of the head performed 11/28/2015 FINDINGS: Brain: No evidence of acute infarction, hemorrhage, hydrocephalus, extra-axial collection or mass lesion / mass effect. Prominence of the sulci reflects mild cortical volume loss. Mild cerebellar atrophy is noted. Mild periventricular white matter change likely reflects small vessel ischemic microangiopathy. The brainstem and fourth ventricle are within normal limits. The basal ganglia are unremarkable in appearance. The cerebral hemispheres demonstrate grossly normal gray-white differentiation. No mass effect or midline shift is seen. Vascular: No hyperdense vessel or unexpected calcification. Skull: There is no evidence of fracture; visualized osseous structures are unremarkable in appearance. Sinuses/Orbits: The orbits are within normal limits. Mild mucosal thickening is noted at the right maxillary sinus. The remaining paranasal sinuses and mastoid air cells are well-aerated. Other: No significant soft tissue abnormalities are seen. IMPRESSION: 1. No acute intracranial pathology seen on CT. 2. Mild cortical volume loss and scattered small vessel ischemic microangiopathy. 3. Mild mucosal thickening at the right maxillary sinus. Electronically Signed   By: Garald Balding M.D.   On: 12/14/2017 02:09    Procedures Procedures (including critical care time)  Case d/w Dr. Rory Percy, unsure if this is a first time seizure, but would work up for syncope and not seizure at this time.  Follow up with outpatient neurology for [place]  Final Clinical Impressions(s) / ED Diagnoses   Final diagnoses:  Syncope and collapse  Abnormal EKG    Will admit for 3 episodes of syncope based on [place] syncope score.     Shenaya Lebo, MD 12/14/17 [phone]

## 2017-12-14 NOTE — ED Triage Notes (Signed)
Patient from home, felt dizzy and passed out twice.  Witnessed by daughter, lasted about 30 seconds each episode.  She is having dizziness and pounding headache.  CBG of 98.

## 2017-12-14 NOTE — ED Notes (Signed)
Pt ambulatory from stretcher to bed without difficulty. Pt denies any dizziness or lightheadedness.

## 2017-12-14 NOTE — Procedures (Signed)
ELECTROENCEPHALOGRAM REPORT  Date of Study: 12/14/2017  Patient's Name: Meagan Ryan MRN: [id number] Date of Birth: 1956-06-06  Referring Provider: Dr. Gean Birchwood  Clinical History: This is a 62 year old woman with recurrent syncope.  Medications: Neurontin  Technical Summary: A multichannel digital EEG recording measured by the international 10-20 system with electrodes applied with paste and impedances below 5000 ohms performed in our laboratory with EKG monitoring in an awake and drowsy patient.  Hyperventilation was not performed. Photic stimulation was performed.  The digital EEG was referentially recorded, reformatted, and digitally filtered in a variety of bipolar and referential montages for optimal display.    Description: The patient is awake and drowsy during the recording.  During maximal wakefulness, there is a symmetric, medium voltage 10-10.5 Hz posterior dominant rhythm that attenuates with eye opening.  The record is symmetric.  During drowsiness, there is an increase in theta slowing of the background, with shifting asymmetry over the bilateral temporal regions. Deeper stages of sleep were not seen. Photic stimulation did not elicit any abnormalities.  There were no epileptiform discharges or electrographic seizures seen.    EKG lead showed irregular rhythm..  Impression: This awake and drowsy EEG is normal.    Clinical Correlation: A normal EEG does not exclude a clinical diagnosis of epilepsy. Clinical correlation is advised.   Ellouise Newer, M.D.

## 2017-12-14 NOTE — Discharge Summary (Signed)
Physician Discharge Summary  Meagan Ryan ZOX:[id number] DOB: 05-Sep-1956 DOA: 12/14/2017  PCP: Hulan Fess, MD  Admit date: 12/14/2017 Discharge date: 12/14/2017  Time spent: 45 minutes  Recommendations for Outpatient Follow-up:  Patient will be discharged to home.  Patient will need to follow up with primary care provider within one week of discharge.  Follow up with neurology. Patient should continue medications as prescribed.  Patient should follow a heart healthy diet.   Discharge Diagnoses:  Presyncope History of chemotherapy-induced cardiomyopathy Sleep apnea Essential hypertension Depression anxiety History of breast cancer History of Mnire's disease  Discharge Condition: Stable  Diet recommendation: Heart healthy  Filed Weights   12/14/17 0641  Weight: 100 kg (220 lb 7.4 oz)    History of present illness:  On 12/14/2017 by Dr. Mike Craze Hunteris a 62 y.o.femalewithhistory of breast cancer in remission, asthma, chemotherapy-induced cardiomyopathy last EF measured in 2017 was 60-65% with grade 2 diastolic dysfunction was witnessed to have 3 episodes of syncope. Patient's daughter states that patient started complaining of pressure in the head around 10:30 PM last night in the bed. Following which patient had a brief episode of syncope and patient woke up with right arm jerking. Appears mildly confused but did not have any incontinence of urine or tongue bite. Similar episode happened again-ablated twice. Patient headache improved by the time patient reached ER.  Hospital Course:  Presyncope -Unknown etiology -Patient was recently in bed when the incident occurred.  She also had some type of a jerking spell of the right upper extremity following the incident. Patient remembers jerking spell and feeling as if she confused about her where-abouts. She did not lose consciousness  -CT head showed no acute intracranial pathology -EEG obtained is  unremarkable elective form discharges or electrographic seizure -MRI brain showed no acute or focal lesion to explain syncope or seizures.  No evidence of metastatic disease. -Echocardiogram EF 50-55%, G2DD -PT recommended cane and outpatient PT, patient refused  History of chemotherapy-induced cardiomyopathy -Repeat echocardiogram as above -Last echocardiogram 2017 showed an EF of 81-19%, grade 2 diastolic dysfunction -Continue Entresto, Coreg, corlanor  Sleep apnea -Continue CPAP nightly  Essential hypertension -Continue Coreg, Corlanor, Entresto  Depression anxiety -Continue Cymbalta, Xanax  History of breast cancer -In remission  History of Mnire's disease -Stable  Consultants None  Procedures  EEG Echocardiogram  Discharge Exam: Vitals:   12/14/17 0641 12/14/17 1425  BP: (!) 156/87 121/75  Pulse: (!) 59 66  Resp: 15 20  Temp: (!) 97.4 F (36.3 C) 97.9 F (36.6 C)  SpO2: 100% 97%   Denies chest pain, shortness of breath, abdominal pain, N/V/D/C, headache, dizziness.    General: Well developed, well nourished, NAD, appears stated age  HEENT: NCAT, mucous membranes moist.  Cardiovascular: S1 S2 auscultated, no rubs, murmurs or gallops. Regular rate and rhythm.  Respiratory: Clear to auscultation bilaterally with equal chest rise  Abdomen: Soft, obese, nontender, nondistended, + bowel sounds  Extremities: warm dry without cyanosis clubbing or edema  Neuro: AAOx3, nonfocal  Psych: Normal affect and demeanor with intact judgement and insight  Discharge Instructions Discharge Instructions    Discharge instructions   Complete by:  As directed    Patient will be discharged to home.  Patient will need to follow up with primary care provider within one week of discharge.  Follow up with neurology. Patient should continue medications as prescribed.  Patient should follow a heart healthy diet.     Allergies as of 12/14/2017  Reactions    Amlodipine Shortness Of Breath   Norvasc [amlodipine Besylate] Shortness Of Breath   Breathing difficulties   Cephalexin Other (See Comments), Nausea And Vomiting   Fever got to 105 High temperature   Codeine Nausea And Vomiting   Gluten Meal Other (See Comments)   Gluten free diet required   Noroxin [norfloxacin]    Pt do not remember   Sulfa Antibiotics Nausea And Vomiting   Sulfamethoxazole Nausea And Vomiting   Latex Itching, Rash   redness   Penicillins Rash   Has patient had a PCN reaction causing immediate rash, facial/tongue/throat swelling, SOB or lightheadedness with hypotension:No Has patient had a PCN reaction causing severe rash involving mucus membranes or skin necrosis: Yes Has patient had a PCN reaction that required hospitalization No Has patient had a PCN reaction occurring within the last 10 years: No If all of the above answers are "NO", then may proceed with Cephalosporin use.      Medication List    TAKE these medications   ALPRAZolam 0.5 MG tablet Commonly known as:  XANAX Take 0.25 mg by mouth 2 (two) times daily.   azelastine 0.1 % nasal spray Commonly known as:  ASTELIN Place 1 spray into both nostrils 2 (two) times daily as needed for rhinitis or allergies.   carvedilol 3.125 MG tablet Commonly known as:  COREG Take 1 tablet (3.125 mg total) by mouth 2 (two) times daily with a meal.   cetirizine 10 MG tablet Commonly known as:  ZYRTEC Take 10 mg by mouth every morning.   DULoxetine 30 MG capsule Commonly known as:  CYMBALTA Take 30 mg by mouth at bedtime.   Fish Oil 1200 MG Caps Take 1,400 mg by mouth 2 (two) times daily.   fluticasone 50 MCG/ACT nasal spray Commonly known as:  FLONASE Place 1 spray into both nostrils every morning.   furosemide 20 MG tablet Commonly known as:  LASIX TAKE 1 TABLET AS NEEDED (FOR INCREASE OF 6 POUNDS OF DRY WEIGHT. TAKE WITH EXTRA POTASSIUM SUPPLEMENT)   gabapentin 300 MG capsule Commonly known as:   NEURONTIN Take 300 mg by mouth 2 (two) times daily.   ivabradine 5 MG Tabs tablet Commonly known as:  CORLANOR Take 1 tablet (5 mg total) by mouth 2 (two) times daily with a meal.   meclizine 12.5 MG tablet Commonly known as:  ANTIVERT Take 12.5 mg by mouth 3 (three) times daily as needed for dizziness.   niacin 500 MG CR tablet Commonly known as:  NIASPAN Take 500 mg by mouth every morning.   nortriptyline 25 MG capsule Commonly known as:  PAMELOR Take 25 mg by mouth at bedtime as needed for sleep. For sleep   omeprazole 20 MG capsule Commonly known as:  PRILOSEC Take 20 mg by mouth daily.   potassium chloride 10 MEQ tablet Commonly known as:  K-DUR Take 10 mEq by mouth daily as needed (when taking lasix).   raloxifene 60 MG tablet Commonly known as:  EVISTA Take 60 mg by mouth every morning.   sacubitril-valsartan 24-26 MG Commonly known as:  ENTRESTO Take 1 tablet by mouth 2 (two) times daily.   VITAMIN D-3 PO Take 1 tablet by mouth 2 (two) times daily. Reported on 10/30/2015      Allergies  Allergen Reactions  . Amlodipine Shortness Of Breath  . Norvasc [Amlodipine Besylate] Shortness Of Breath    Breathing difficulties  . Cephalexin Other (See Comments) and Nausea And Vomiting  Fever got to 105 High temperature  . Codeine Nausea And Vomiting  . Gluten Meal Other (See Comments)    Gluten free diet required  . Noroxin [Norfloxacin]     Pt do not remember  . Sulfa Antibiotics Nausea And Vomiting  . Sulfamethoxazole Nausea And Vomiting  . Latex Itching and Rash    redness  . Penicillins Rash    Has patient had a PCN reaction causing immediate rash, facial/tongue/throat swelling, SOB or lightheadedness with hypotension:No Has patient had a PCN reaction causing severe rash involving mucus membranes or skin necrosis: Yes Has patient had a PCN reaction that required hospitalization No Has patient had a PCN reaction occurring within the last 10 years: No If  all of the above answers are "NO", then may proceed with Cephalosporin use.       The results of significant diagnostics from this hospitalization (including imaging, microbiology, ancillary and laboratory) are listed below for reference.    Significant Diagnostic Studies: Dg Chest 2 View  Result Date: 12/14/2017 CLINICAL DATA:  Subacute onset of dizziness and syncope. Pounding headache. EXAM: CHEST  2 VIEW COMPARISON:  Chest radiograph performed 10/10/2015 FINDINGS: The lungs are well-aerated and clear. There is no evidence of focal opacification, pleural effusion or pneumothorax. The heart is borderline normal in size. No acute osseous abnormalities are seen. Postoperative change is noted about the upper abdomen. IMPRESSION: No acute cardiopulmonary process seen. Electronically Signed   By: Garald Balding M.D.   On: 12/14/2017 02:08   Ct Head Wo Contrast  Result Date: 12/14/2017 CLINICAL DATA:  Acute onset of dizziness and syncope. Pounding headache. EXAM: CT HEAD WITHOUT CONTRAST TECHNIQUE: Contiguous axial images were obtained from the base of the skull through the vertex without intravenous contrast. COMPARISON:  CT of the head performed 11/28/2015 FINDINGS: Brain: No evidence of acute infarction, hemorrhage, hydrocephalus, extra-axial collection or mass lesion / mass effect. Prominence of the sulci reflects mild cortical volume loss. Mild cerebellar atrophy is noted. Mild periventricular white matter change likely reflects small vessel ischemic microangiopathy. The brainstem and fourth ventricle are within normal limits. The basal ganglia are unremarkable in appearance. The cerebral hemispheres demonstrate grossly normal gray-white differentiation. No mass effect or midline shift is seen. Vascular: No hyperdense vessel or unexpected calcification. Skull: There is no evidence of fracture; visualized osseous structures are unremarkable in appearance. Sinuses/Orbits: The orbits are within normal  limits. Mild mucosal thickening is noted at the right maxillary sinus. The remaining paranasal sinuses and mastoid air cells are well-aerated. Other: No significant soft tissue abnormalities are seen. IMPRESSION: 1. No acute intracranial pathology seen on CT. 2. Mild cortical volume loss and scattered small vessel ischemic microangiopathy. 3. Mild mucosal thickening at the right maxillary sinus. Electronically Signed   By: Garald Balding M.D.   On: 12/14/2017 02:09   Mr Jeri Cos HY Contrast  Result Date: 12/14/2017 CLINICAL DATA:  New onset syncope and seizures. Personal history of breast cancer. EXAM: MRI HEAD WITHOUT AND WITH CONTRAST TECHNIQUE: Multiplanar, multiecho pulse sequences of the brain and surrounding structures were obtained without and with intravenous contrast. CONTRAST:  94mL MULTIHANCE GADOBENATE DIMEGLUMINE 529 MG/ML IV SOLN COMPARISON:  CT head without contrast 12/14/2017. MRI brain 04/27/2012. FINDINGS: Brain: The diffusion-weighted images demonstrate no acute or subacute infarction. Dilated perivascular spaces are present throughout the basal ganglia. Mild generalized atrophy is advanced for age. No significant white matter disease is present otherwise. Dedicated imaging of the temporal lobes demonstrates normal appearance of the hippocampal  structures bilaterally. No mass lesion is present. There is no acute hemorrhage. Ventricles are of normal size. No significant extra-axial fluid collection is present. The postcontrast images demonstrate no pathologic enhancement. Vascular: Flow is present in the major intracranial arteries. Skull and upper cervical spine: The skull base is within normal limits. The craniocervical junction is normal. Midline sagittal structures are unremarkable. Marrow signal is within normal limits. Sinuses/Orbits: A polyp or mucous retention cyst is present along the inferior right maxillary sinus. The paranasal sinuses and mastoid air cells are otherwise clear.  Bilateral globes and orbits are within normal limits. IMPRESSION: 1. No acute or focal lesion to explain syncope or seizures. 2. No evidence for metastatic disease. 3. Atrophy is mildly advanced for age. Electronically Signed   By: San Morelle M.D.   On: 12/14/2017 10:05    Microbiology: No results found for this or any previous visit (from the past 240 hour(s)).   Labs: Basic Metabolic Panel: Recent Labs  Lab 12/14/17 0222 12/14/17 0426  NA 138 137  K 3.8 3.8  CL 102 104  CO2  --  22  GLUCOSE 118* 109*  BUN 10 10  CREATININE 0.90 0.98  CALCIUM  --  9.4   Liver Function Tests: No results for input(s): AST, ALT, ALKPHOS, BILITOT, PROT, ALBUMIN in the last 168 hours. No results for input(s): LIPASE, AMYLASE in the last 168 hours. No results for input(s): AMMONIA in the last 168 hours. CBC: Recent Labs  Lab 12/14/17 0138 12/14/17 0222 12/14/17 0426  WBC 5.4  --  5.5  NEUTROABS 3.0  --   --   HGB 13.9 14.3 13.7  HCT 42.5 42.0 41.9  MCV 91.8  --  91.5  PLT 189  --  206   Cardiac Enzymes: No results for input(s): CKTOTAL, CKMB, CKMBINDEX, TROPONINI in the last 168 hours. BNP: BNP (last 3 results) No results for input(s): BNP in the last 8760 hours.  ProBNP (last 3 results) No results for input(s): PROBNP in the last 8760 hours.  CBG: No results for input(s): GLUCAP in the last 168 hours.     Signed:  Cristal Ford  [facility] 12/14/2017, 4:15 PM

## 2017-12-14 NOTE — ED Notes (Signed)
Attempted to call report to [phone] 

## 2018-01-11 ENCOUNTER — Other Ambulatory Visit: Payer: Self-pay | Admitting: Family Medicine

## 2018-01-11 DIAGNOSIS — Z139 Encounter for screening, unspecified: Secondary | ICD-10-CM

## 2018-01-22 ENCOUNTER — Encounter: Payer: Self-pay | Admitting: Emergency Medicine

## 2018-01-22 ENCOUNTER — Emergency Department (INDEPENDENT_AMBULATORY_CARE_PROVIDER_SITE_OTHER)
Admission: EM | Admit: 2018-01-22 | Discharge: 2018-01-22 | Disposition: A | Discharge status: Home / Self Care | Source: Home / Self Care | Attending: Family Medicine | Admitting: Family Medicine

## 2018-01-22 ENCOUNTER — Other Ambulatory Visit: Payer: Self-pay

## 2018-01-22 DIAGNOSIS — B9789 Other viral agents as the cause of diseases classified elsewhere: Secondary | ICD-10-CM | POA: Diagnosis not present

## 2018-01-22 DIAGNOSIS — J069 Acute upper respiratory infection, unspecified: Secondary | ICD-10-CM

## 2018-01-22 MED ORDER — BENZONATATE 200 MG PO CAPS: ORAL_CAPSULE | ORAL | 0 refills | Status: DC

## 2018-01-22 MED ORDER — DOXYCYCLINE HYCLATE 100 MG PO CAPS: 100.0000 mg | ORAL_CAPSULE | Freq: Two times a day (BID) | ORAL | 0 refills | Status: DC

## 2018-01-22 MED ORDER — PREDNISONE 20 MG PO TABS: ORAL_TABLET | ORAL | 0 refills | Status: DC

## 2018-01-22 NOTE — ED Triage Notes (Signed)
Reports sinus congestion and pressure along with headache for past 3-4 days; feels she has a sinus infection; no fever.

## 2018-01-22 NOTE — ED Provider Notes (Signed)
[name]    CSN: [id number] Arrival date & time: 01/22/18  1212     History   Chief Complaint Chief Complaint  Patient presents with  . Headache  . Nasal Congestion    HPI Meagan Ryan is a 61 y.o. female.   Patient complains of 3 to 4 day history of typical cold-like symptoms developing over several days, including mild sore throat, sinus congestion, headache, fatigue, sneezing, and cough.  Her cough is non-productive and worse at night.  She has developed increased sinus congestion and facial pain.  She has a history of seasonal rhinitis and recurring sinusitis.   The history is provided by the patient.    Past Medical History:  Diagnosis Date  . Allergic rhinitis   . Asthma   . Breast cancer ([place]) 1996   left breast-stage II infiltrating ductal carcinoma, 3/8 axillary lymph nodes were positive for metastatic carcinoma, getting yearly mammogram and every other year MRI followed by oncologist Dr Benay Spice  . Chronic combined systolic and diastolic heart failure ([place]) 02/26/2016  . Complication of anesthesia    WAKE UP WITH HEADACHE  . DCM (dilated cardiomyopathy) ([place])    EF normalized at 60-65% on echo 07/2016  . Dysuria 2011  . Family history of adverse reaction to anesthesia    "family wakes up with personality changes, but are very short term"  . Fatty liver CT 1/12   elevated LFT  . H/O actinic keratosis   . H/O allergic rhinitis   . H/O fibromyalgia   . H/O osteopenia 2009  . H/O peripheral neuropathy   . H/O varicose veins   . History of IBS   . Hx of colonic polyps   . Hx of migraines   . Hx of ovarian cyst   . Hyperlipidemia   . Hypertension   . Incomplete RBBB   . 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 OSA (obstructive sleep apnea) 12/31/2015   Very mild with AHI 6/hr but elevated RDI at 32/hr.  . Pelvic pain in female   . PMB (postmenopausal bleeding) 2011  . PONV (postoperative nausea and vomiting)   . Pregnancy induced  hypertension   . Right-sided lacunar infarction    MRI 1.2012  . Tubular adenoma of colon     Patient Active Problem List   Diagnosis Date Noted  . Syncope 12/14/2017  . Pain and swelling of right lower leg 10/28/2016  . Right leg numbness 10/28/2016  . Chronic systolic CHF (congestive heart failure), NYHA class 2 ([place]) 02/26/2016  . OSA (obstructive sleep apnea) 12/31/2015  . History of breast cancer in female 10/30/2015  . Ovarian cyst 10/10/2015  . Bilateral ovarian cysts 10/09/2015  . S/P oophorectomy 10/09/2015  . Morbid obesity with BMI of 40.0-44.9, adult ([place]) 08/08/2015  . Benign essential HTN 09/18/2014  . RBBB 09/18/2014  . Incomplete RBBB   . DCM (dilated cardiomyopathy) ([place]) 04/24/2014  . Meniere disease 06/02/2012    Past Surgical History:  Procedure Laterality Date  . gastroc muscle repair  2007  . LAPAROSCOPIC CHOLECYSTECTOMY    . MASTECTOMY  06/1995   Left - for infiltrating ductal carcinoma  . 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 ROBOTIC ASSISTED TOTAL HYSTERECTOMY WITH BILATERAL SALPINGO OOPHERECTOMY Bilateral 10/09/2015   Procedure: ROBOTIC ASSISTED BILATERAL SALPINGO OOPHORECTOMY ;  Surgeon: Everitt Amber, MD;  Location: [facility];  Service: Gynecology;  Laterality: Bilateral;  . tram surgery  1996  . TUBAL LIGATION      OB History    Gravida Para Term Preterm AB Living   1 1 1     1    SAB TAB Ectopic Multiple Live Births           1       Home Medications    Prior to Admission medications   Medication Sig Start Date End Date Taking? Authorizing Provider  ALPRAZolam Duanne Moron) 0.5 MG tablet Take 0.25 mg by mouth 2 (two) times daily.     [provider]  azelastine (ASTELIN) 0.1 % nasal spray Place 1 spray into both nostrils 2 (two) times daily as needed for rhinitis or allergies.  07/21/15   [provider]  benzonatate (TESSALON) 200 MG capsule Take one cap by mouth at bedtime as needed for cough.  May repeat in 4  to 6 hours 01/22/18   Kandra Nicolas, MD  carvedilol (COREG) 3.125 MG tablet Take 1 tablet (3.125 mg total) by mouth 2 (two) times daily with a meal. 04/01/17   Turner, Eber Hong, MD  cetirizine (ZYRTEC) 10 MG tablet Take 10 mg by mouth every morning.     [provider]  Cholecalciferol (VITAMIN D-3 PO) Take 1 tablet by mouth 2 (two) times daily. Reported on 10/30/2015    [provider]  doxycycline (VIBRAMYCIN) 100 MG capsule Take 1 capsule (100 mg total) by mouth 2 (two) times daily. Take with food. 01/22/18   Kandra Nicolas, MD  DULoxetine (CYMBALTA) 30 MG capsule Take 30 mg by mouth at bedtime.     [provider]  fluticasone (FLONASE) 50 MCG/ACT nasal spray Place 1 spray into both nostrils every morning.  06/26/15   [provider]  furosemide (LASIX) 20 MG tablet TAKE 1 TABLET AS NEEDED (FOR INCREASE OF 6 POUNDS OF DRY WEIGHT. TAKE WITH EXTRA POTASSIUM SUPPLEMENT) 11/22/17   Sueanne Margarita, MD  gabapentin (NEURONTIN) 300 MG capsule Take 300 mg by mouth 2 (two) times daily. 02/14/16   [provider]  ivabradine (CORLANOR) 5 MG TABS tablet Take 1 tablet (5 mg total) by mouth 2 (two) times daily with a meal. 04/05/17   Turner, Eber Hong, MD  meclizine (ANTIVERT) 12.5 MG tablet Take 12.5 mg by mouth 3 (three) times daily as needed for dizziness.    [provider]  niacin (NIASPAN) 500 MG CR tablet Take 500 mg by mouth every morning.     [provider]  nortriptyline (PAMELOR) 25 MG capsule Take 25 mg by mouth at bedtime as needed for sleep. For sleep    [provider]  Omega-3 Fatty Acids (FISH OIL) 1200 MG CAPS Take 1,400 mg by mouth 2 (two) times daily.    [provider]  omeprazole (PRILOSEC) 20 MG capsule Take 20 mg by mouth daily. 12/29/15   [provider]  potassium chloride (K-DUR) 10 MEQ tablet Take 10 mEq by mouth daily as needed (when taking lasix).     [provider]  predniSONE  (DELTASONE) 20 MG tablet Take one tab by mouth twice daily for 4 days, then one daily. Take with food. 01/22/18   Kandra Nicolas, MD  raloxifene (EVISTA) 60 MG tablet Take 60 mg by mouth every morning.     [provider]  sacubitril-valsartan (ENTRESTO) 24-26 MG Take 1 tablet by mouth 2 (two) times daily. 04/05/17   Sueanne Margarita, MD    Family History Family History  Problem Relation Age of Onset  . Heart disease Mother        MI  . Stroke Mother   . Diabetes Mother   . Hypertension Mother   . Cancer Father        Prostate  . Stroke Father   . Cancer Sister        Colon    Social History Social History   Tobacco Use  . Smoking status: Never Smoker  . Smokeless tobacco: Never Used  Substance Use Topics  . Alcohol use: No  . Drug use: No     Allergies   Amlodipine; Norvasc [amlodipine besylate]; Cephalexin; Codeine; Gluten meal; Noroxin [norfloxacin]; Sulfa antibiotics; Sulfamethoxazole; Latex; and Penicillins   Review of Systems Review of Systems + sore throat + cough + sneezing No pleuritic pain No wheezing + nasal congestion + post-nasal drainage + sinus pain/pressure No itchy/red eyes No earache No hemoptysis No SOB No fever, ? chills No nausea No vomiting No abdominal pain No diarrhea No urinary symptoms No skin rash + fatigue No myalgias + headache Used OTC meds without relief   Physical Exam Triage Vital Signs ED Triage Vitals [01/22/18 1242]  Enc Vitals Group     BP 125/84     Pulse Rate 85     Resp 18     Temp 98.5 F (36.9 C)     Temp Source Oral     SpO2 98 %     Weight 234 lb (106.1 kg)     Height 5\' 4"  (1.626 m)     Head Circumference      Peak Flow      Pain Score 5     Pain Loc      Pain Edu?      Excl. in [place]?    No data found.  Updated Vital Signs BP 125/84 (BP Location: Right Arm)   Pulse 85   Temp 98.5 F (36.9 C) (Oral)   Resp 18   Ht 5\' 4"  (1.626 m)   Wt 234 lb (106.1 kg)   SpO2 98%   BMI 40.17  kg/m   Visual Acuity Right Eye Distance:   Left Eye Distance:   Bilateral Distance:    Right Eye Near:   Left Eye Near:    Bilateral Near:     Physical Exam Nursing notes and Vital Signs reviewed. Appearance:  Patient appears stated age, and in no acute distress Eyes:  Pupils are equal, round, and reactive to light and accomodation.  Extraocular movement is intact.  Conjunctivae are not inflamed  Ears:  Canals normal.  Tympanic membranes normal.  Nose:  Congested turbinates.  Maxillary sinus tenderness is present.  Pharynx:  Normal Neck:  Supple.  Enlarged posterior/lateral nodes are palpated bilaterally, tender to palpation on the left.   Lungs:  Clear to auscultation.  Breath sounds are equal.  Moving air well. Heart:  Regular rate and rhythm without murmurs, rubs, or gallops.  Abdomen:  Nontender without masses or hepatosplenomegaly.  Bowel sounds are present.  No CVA or flank tenderness.  Extremities:  No edema.  Skin:  No rash present.    UC Treatments / Results  Labs (all labs ordered are listed, but only abnormal results are displayed) Labs Reviewed - No data to display  EKG  EKG Interpretation None       Radiology No results found.  Procedures Procedures (including critical care time)  Medications Ordered in UC Medications - No data to display  Initial Impression / Assessment and Plan / UC Course  I have reviewed the triage vital signs and the nursing notes.  Pertinent labs & imaging results that were available during my care of the patient were reviewed by me and considered in my medical decision making (see chart for details).    Because of her history of recurring sinusitis and perennial rhinitis, will begin prednisone burst/taper and empiric doxycycline. Prescription written for Benzonatate [facility]) to take at bedtime for night-time cough.  Take plain guaifenesin (1200mg  extended release tabs such as Mucinex) twice daily, with plenty of water,  for cough and congestion.  May add Pseudoephedrine (30mg , one or two every 4 to 6 hours) for sinus congestion.  Get adequate rest.   May use Afrin nasal spray (or generic oxymetazoline) each morning for about 5 days and then discontinue.  Also recommend using saline nasal spray several times daily and saline nasal irrigation (AYR is a common brand).  Use Flonase nasal spray each morning after using Afrin nasal spray and saline nasal irrigation. Try warm salt water gargles for sore throat.  Stop all antihistamines for now, and other non-prescription cough/cold preparations. May take Delsym Cough Suppressant with Tessalon at bedtime for nighttime cough.  Followup with Family Doctor if not improved in 10 days.    Final Clinical Impressions(s) / UC Diagnoses   Final diagnoses:  Viral URI with cough    ED Discharge Orders        Ordered    doxycycline (VIBRAMYCIN) 100 MG capsule  2 times daily     01/22/18 1409    predniSONE (DELTASONE) 20 MG tablet     01/22/18 1409    benzonatate (TESSALON) 200 MG capsule     01/22/18 1409         Kandra Nicolas, MD 01/23/18 1355

## 2018-01-22 NOTE — Discharge Instructions (Signed)
Take plain guaifenesin (1200mg  extended release tabs such as Mucinex) twice daily, with plenty of water, for cough and congestion.  May add Pseudoephedrine (30mg , one or two every 4 to 6 hours) for sinus congestion.  Get adequate rest.   May use Afrin nasal spray (or generic oxymetazoline) each morning for about 5 days and then discontinue.  Also recommend using saline nasal spray several times daily and saline nasal irrigation (AYR is a common brand).  Use Flonase nasal spray each morning after using Afrin nasal spray and saline nasal irrigation. Try warm salt water gargles for sore throat.  Stop all antihistamines for now, and other non-prescription cough/cold preparations. May take Delsym Cough Suppressant with Tessalon at bedtime for nighttime cough.

## 2018-01-26 ENCOUNTER — Institutional Professional Consult (permissible substitution): Admitting: Neurology

## 2018-01-26 ENCOUNTER — Telehealth: Payer: Self-pay

## 2018-01-26 NOTE — Telephone Encounter (Signed)
Z pac called to [employer], daughter notified

## 2018-01-26 NOTE — Telephone Encounter (Signed)
May discontinue doxycycline and begin a Z-pak.

## 2018-02-07 ENCOUNTER — Ambulatory Visit
Admission: RE | Admit: 2018-02-07 | Discharge: 2018-02-07 | Disposition: A | Discharge status: Home / Self Care | Source: Ambulatory Visit | Attending: Family Medicine | Admitting: Family Medicine

## 2018-02-07 DIAGNOSIS — Z139 Encounter for screening, unspecified: Secondary | ICD-10-CM

## 2018-02-08 ENCOUNTER — Other Ambulatory Visit: Payer: Self-pay | Admitting: Family Medicine

## 2018-02-08 DIAGNOSIS — R928 Other abnormal and inconclusive findings on diagnostic imaging of breast: Secondary | ICD-10-CM

## 2018-02-16 ENCOUNTER — Other Ambulatory Visit: Payer: Self-pay | Admitting: Family Medicine

## 2018-02-16 ENCOUNTER — Ambulatory Visit
Admission: RE | Admit: 2018-02-16 | Discharge: 2018-02-16 | Disposition: A | Discharge status: Home / Self Care | Source: Ambulatory Visit | Attending: Family Medicine | Admitting: Family Medicine

## 2018-02-16 DIAGNOSIS — N6310 Unspecified lump in the right breast, unspecified quadrant: Secondary | ICD-10-CM

## 2018-02-16 DIAGNOSIS — R928 Other abnormal and inconclusive findings on diagnostic imaging of breast: Secondary | ICD-10-CM

## 2018-02-21 ENCOUNTER — Ambulatory Visit
Admission: RE | Admit: 2018-02-21 | Discharge: 2018-02-21 | Disposition: A | Discharge status: Home / Self Care | Source: Ambulatory Visit | Attending: Family Medicine | Admitting: Family Medicine

## 2018-02-21 DIAGNOSIS — N6310 Unspecified lump in the right breast, unspecified quadrant: Secondary | ICD-10-CM

## 2018-02-22 ENCOUNTER — Telehealth: Payer: Self-pay | Admitting: Hematology

## 2018-02-22 NOTE — Telephone Encounter (Signed)
Spoke with patient and confirmed afternoon [facility] appointment for 4/24, packet will be mailed to patient

## 2018-02-23 ENCOUNTER — Encounter: Payer: Self-pay | Admitting: *Deleted

## 2018-02-23 DIAGNOSIS — Z170 Estrogen receptor positive status [ER+]: Principal | ICD-10-CM

## 2018-02-23 DIAGNOSIS — C50411 Malignant neoplasm of upper-outer quadrant of right female breast: Secondary | ICD-10-CM | POA: Insufficient documentation

## 2018-02-28 NOTE — Progress Notes (Signed)
[place]  [phone] Fax:([phone]) [place] Note   Patient Care Team: Hulan Fess, MD as PCP - General (Family Medicine) Sueanne Margarita, MD as Consulting Physician (Cardiology) Gery Pray, MD as Consulting Physician (Radiation Oncology) Excell Seltzer, MD as Consulting Physician (General Surgery) 03/01/2018  CHIEF COMPLAINTS/PURPOSE OF CONSULTATION:  Right breast invasive ductal carcinoma   Oncology History   Cancer Staging Malignant neoplasm of upper-outer quadrant of right breast in female, estrogen receptor positive ([place]) Staging form: Breast, AJCC 8th Edition - Clinical stage from 02/21/2018: Stage IA (cT1b, cN0, cM0, G1, ER+, PR+, HER2-) - Signed by Truitt Merle, MD on 03/01/2018       Malignant neoplasm of upper-outer quadrant of right breast in female, estrogen receptor positive ([place])   02/16/2018 Mammogram    IMPRESSION: Irregular hypoechoic mass in the RIGHT breast at the 12 o'clock axis, retroareolar, measuring 7 mm, corresponding to the new mass seen on mammogram. This is a suspicious finding for which ultrasound-guided biopsy is recommended.      02/21/2018 Initial Biopsy    Diagnosis 02/21/18 Breast, right, needle core biopsy, 12 o'clock - INVASIVE DUCTAL CARCINOMA. - DUCTAL CARCINOMA IN SITU. - SEE COMMENT.      02/21/2018 Receptors her2    Prognostic indicators significant for: ER, 100% positive and PR, 100% positive or negative, both with strong staining intensity. Proliferation marker Ki67 at 5%. HER2 negative.      02/21/2018 Cancer Staging    Staging form: Breast, AJCC 8th Edition - Clinical stage from 02/21/2018: Stage IA (cT1b, cN0, cM0, G1, ER+, PR+, HER2-) - Signed by Truitt Merle, MD on 03/01/2018      02/23/2018 Initial Diagnosis    Malignant neoplasm of upper-outer quadrant of right breast in female, estrogen receptor positive ([place])      HISTORY OF PRESENTING ILLNESS:  Meagan Ryan 62 y.o.  female is a here because of newly diagnosed right breast cancer. The patient was referred by her PCP Dr. Rex Kras. The patient presents to the clinic today accompanied by daughter.  Pt reports she had breast cancer previously in 1996. She had a left breast mastectomy with sentinel node resection. She received adjuvant chemotherapy. She has been taking Evista since she completed chemo per her PCP Dr. Rex Kras. Her oncologist was Dr. Benay Spice.   Prior to pt's abnormal mammogram, she did not feel any abnormality. She states she has been compliant with yearly screening since her previous breast cancer diagnosis.   Pt's screening mammogram from 02/15/18 warranted further evaluation. Her diagnostic mammogram from 02/16/18 revealed an irregular hypoechoic mass in the right breast at the 12 o'clock position measuring 7 mm. Her initial biopsy confirmed invasive ductal carcinoma.   Prognostic indicators significant for: ER, 100% positive and PR, 100% positive or negative, both with strong staining intensity. Proliferation marker Ki67 at 5%. HER2 negative.  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 She has a surgical history of a partial hysterectomy, tubal ligation, nephrectomy, and cholecystectomy.    GYN HISTORY  Menarchal: 12 LMP: 48 Contraceptive: None  HRT: None  G1P1:  She has a FMHx of prostate cancer by her father, and colon cancer by her sister. She denies tobacco use or alcohol use.   Socially, she is windowed and lives at home with her daughter. She previously was in the [employer] and worked at a  [employer].   MEDICAL HISTORY:  Past Medical History:  Diagnosis Date  . Allergic rhinitis   . Asthma   . Breast cancer ([place]) 1996   left breast-stage II infiltrating ductal carcinoma, 3/8 axillary lymph nodes were positive for metastatic  carcinoma, getting yearly mammogram and every other year MRI followed by oncologist Dr Benay Spice  . CHF (congestive heart failure) ([place])   . Chronic combined systolic and diastolic heart failure ([place]) 02/26/2016  . Complication of anesthesia    WAKE UP WITH HEADACHE  . DCM (dilated cardiomyopathy) ([place])    EF normalized at 60-65% on echo 07/2016  . Dysuria 2011  . Family history of adverse reaction to anesthesia    "family wakes up with personality changes, but are very short term"  . Fatty liver CT 1/12   elevated LFT  . H/O actinic keratosis   . H/O allergic rhinitis   . H/O fibromyalgia   . H/O osteopenia 2009  . H/O peripheral neuropathy   . H/O varicose veins   . History of IBS   . Hx of colonic polyps   . Hx of migraines   . Hx of ovarian cyst   . Hyperlipidemia   . Hypertension   . Incomplete RBBB   . 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 OSA (obstructive sleep apnea) 12/31/2015   Very mild with AHI 6/hr but elevated RDI at 32/hr.  . Pelvic pain in female   . PMB (postmenopausal bleeding) 2011  . PONV (postoperative nausea and vomiting)   . Pregnancy induced hypertension   . Right-sided lacunar infarction ([place])    MRI 1.2012  . Tubular adenoma of colon     SURGICAL HISTORY: Past Surgical History:  Procedure Laterality Date  . gastroc muscle repair  2007  . LAPAROSCOPIC CHOLECYSTECTOMY    . MASTECTOMY  06/1995   Left - for infiltrating ductal carcinoma  . 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 ROBOTIC ASSISTED TOTAL HYSTERECTOMY WITH BILATERAL SALPINGO OOPHERECTOMY Bilateral 10/09/2015   Procedure: ROBOTIC ASSISTED BILATERAL SALPINGO OOPHORECTOMY ;  Surgeon: Everitt Amber, MD;  Location: [facility];  Service: Gynecology;  Laterality: Bilateral;  . tram surgery  1996  . TUBAL LIGATION      SOCIAL HISTORY: Social History   Socioeconomic History  . Marital status: Widowed    Spouse name: Not on file  . Number of children: Not on file  . Years of  education: Not on file  . Highest education level: Not on file  Occupational History  . Not on file  Social Needs  . Financial resource strain: Not on file  . Food insecurity:    Worry: Not on file    Inability: Not on file  . Transportation needs:    Medical: Not on file    Non-medical: Not on file  Tobacco Use  . Smoking status: Never Smoker  . Smokeless tobacco: Never Used  Substance and Sexual Activity  . Alcohol use: No  . Drug use: No  . Sexual activity: Not on file  Lifestyle  . Physical activity:    Days per week: Not on file    Minutes per session: Not on file  . Stress: Not on file  Relationships  . Social connections:    Talks on phone: Not on file    Gets together: Not on file    Attends religious service: Not on file    Active member of club  or organization: Not on file    Attends meetings of clubs or organizations: Not on file    Relationship status: Not on file  . Intimate partner violence:    Fear of current or ex partner: Not on file    Emotionally abused: Not on file    Physically abused: Not on file    Forced sexual activity: Not on file  Other Topics Concern  . 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 Wears Ear plugs   Marital Status: Widowed   1 daughter    FAMILY HISTORY: Family History  Problem Relation Age of Onset  . Heart disease Mother        MI  . Stroke Mother   . Diabetes Mother   . Hypertension Mother   . Cancer Father        Prostate  . Stroke Father   . Cancer Sister        Colon  . Breast cancer Neg Hx     ALLERGIES:  is allergic to amlodipine; norvasc [amlodipine besylate]; cephalexin; codeine; noroxin [norfloxacin]; sulfa antibiotics; sulfamethoxazole; latex; and penicillins.  MEDICATIONS:  Current Outpatient Medications    Medication Sig Dispense Refill  . ALPRAZolam (XANAX) 0.5 MG tablet Take 0.25 mg by mouth 2 (two) times daily.     . carvedilol (COREG) 3.125 MG tablet Take 1 tablet (3.125 mg total) by mouth 2 (two) times daily with a meal. 180 tablet 3  . cetirizine (ZYRTEC) 10 MG tablet Take 10 mg by mouth every morning.     . DULoxetine (CYMBALTA) 30 MG capsule Take 30 mg by mouth at bedtime.     . fluticasone (FLONASE) 50 MCG/ACT nasal spray Place 1 spray into both nostrils every morning.     . gabapentin (NEURONTIN) 300 MG capsule Take 300 mg by mouth 2 (two) times daily.    . ivabradine (CORLANOR) 5 MG TABS tablet Take 1 tablet (5 mg total) by mouth 2 (two) times daily with a meal. 180 tablet 3  . niacin (NIASPAN) 500 MG CR tablet Take 500 mg by mouth every morning.     . nortriptyline (PAMELOR) 25 MG capsule Take 25 mg by mouth at bedtime as needed for sleep. For sleep    . omeprazole (PRILOSEC) 20 MG capsule Take 20 mg by mouth daily.    . potassium chloride (K-DUR) 10 MEQ tablet Take 10 mEq by mouth daily as needed (when taking lasix).     . raloxifene (EVISTA) 60 MG tablet Take 60 mg by mouth every morning.     . sacubitril-valsartan (ENTRESTO) 24-26 MG Take 1 tablet by mouth 2 (two) times daily. 180 tablet 3  . azelastine (ASTELIN) 0.1 % nasal spray Place 1 spray into both nostrils 2 (two) times daily as needed for rhinitis or allergies.      No current facility-administered medications for this visit.     REVIEW OF SYSTEMS:   Constitutional: Denies fevers, chills or abnormal night sweats Eyes: Denies blurriness of vision, double vision or watery eyes Ears, nose, mouth, throat, and face: Denies mucositis or sore throat (+) left ear deafness  Respiratory: Denies cough, dyspnea or wheezes Cardiovascular: Denies palpitation, chest discomfort or lower extremity swelling Gastrointestinal:  Denies nausea, heartburn or change in bowel habits  Skin: Denies abnormal skin rashes Lymphatics: Denies new  lymphadenopathy or easy bruising Neurological:Denies numbness, tingling or new weaknesses Behavioral/Psych: Mood is stable, no new changes  All other systems were reviewed with the patient and are negative.  PHYSICAL EXAMINATION: ECOG PERFORMANCE STATUS: 0 - Asymptomatic  Vitals:   03/01/18 1240  BP: 135/88  Pulse: 76  Resp: 16  Temp: 97.6 F (36.4 C)  SpO2: 99%   Filed Weights   03/01/18 1240  Weight: 229 lb (103.9 kg)    GENERAL:alert, no distress and comfortable SKIN: skin color, texture, turgor are normal, no rashes or significant lesions EYES: normal, conjunctiva are pink and non-injected, sclera clear OROPHARYNX:no exudate, no erythema and lips, buccal mucosa, and tongue normal  NECK: supple, thyroid normal size, non-tender, without nodularity LYMPH:  no palpable lymphadenopathy in the cervical, axillary or inguinal LUNGS: clear to auscultation and percussion with normal breathing effort HEART: regular rate & rhythm and no murmurs and no lower extremity edema ABDOMEN:abdomen soft, non-tender and normal bowel sounds Musculoskeletal:no cyanosis of digits and no clubbing  PSYCH: alert & oriented x 3 with fluent speech NEURO: no focal motor/sensory deficits Breasts: Breast inspection showed them to be symmetrical with no nipple discharge. Palpation of the breasts and axilla revealed no obvious mass that I could appreciate. She has significant skin bruise at the 3 o'clock position in the right breast secondary to biopsy.    LABORATORY DATA:  I have reviewed the data as listed CBC Latest Ref Rng & Units 12/14/2017 12/14/2017 12/14/2017  WBC 4.0 - 10.5 K/uL 5.5 - 5.4  Hemoglobin 12.0 - 15.0 g/dL 13.7 14.3 13.9  Hematocrit 36.0 - 46.0 % 41.9 42.0 42.5  Platelets 150 - 400 K/uL 206 - 189   CMP Latest Ref Rng & Units 12/14/2017 12/14/2017 03/16/2017  Glucose 65 - 99 mg/dL 109(H) 118(H) 132(H)  BUN 6 - 20 mg/dL 10 10 10   Creatinine 0.44 - 1.00 mg/dL 0.98 0.90 0.93  Sodium 135 - 145  mmol/L 137 138 140  Potassium 3.5 - 5.1 mmol/L 3.8 3.8 4.0  Chloride 101 - 111 mmol/L 104 102 99  CO2 22 - 32 mmol/L 22 - 25  Calcium 8.9 - 10.3 mg/dL 9.4 - 9.2  Total Protein 6.5 - 8.1 g/dL - - -  Total Bilirubin 0.3 - 1.2 mg/dL - - -  Alkaline Phos 38 - 126 U/L - - -  AST 15 - 41 U/L - - -  ALT 14 - 54 U/L - - -   PATHOLOGY Diagnosis 02/21/18 Breast, right, needle core biopsy, 12 o'clock - INVASIVE DUCTAL CARCINOMA. - DUCTAL CARCINOMA IN SITU. - SEE COMMENT. Microscopic Comment The carcinoma partially involves a papilloma. A breast prognostic profile will be attempted on the invasive component and the results reported separately. The results were called to [facility] on 02/22/2018. (JBK:ecj 02/22/2018) Results: Estrogen Receptor: 100%, POSITIVE, STRONG STAINING INTENSITY Progesterone Receptor: 100%, POSITIVE, STRONG STAINING INTENSITY Proliferation Marker Ki67: 5% HER2 - NEGATIVE  RADIOGRAPHIC STUDIES: I have personally reviewed the radiological images as listed and agreed with the findings in the report.  Diagnostic Mammogram and [place] 02/16/18 IMPRESSION: Irregular hypoechoic mass in the RIGHT breast at the 12 o'clock axis, retroareolar, measuring 7 mm, corresponding to the new mass seen on mammogram. This is a suspicious finding for which ultrasound-guided biopsy is recommended.  Screening Mammogram 02/07/18 IMPRESSION: Further evaluation is suggested for possible mass in the right breast.  US Breast Ltd Uni Right Inc Axilla  Result Date: 02/16/2018 CLINICAL DATA:  Patient returns today to evaluate a RIGHT breast mass identified on recent screening mammogram. EXAM: DIGITAL DIAGNOSTIC RIGHT MAMMOGRAM WITH CAD AND TOMO ULTRASOUND RIGHT BREAST COMPARISON:  Previous exams including recent screening mammogram dated 02/07/2018. ACR Breast Density Category b: There are scattered areas of fibroglandular density. FINDINGS: On today's additional diagnostic views,  including spot compression with 3D tomosynthesis, a new oval slightly irregular mass is confirmed within the retroareolar RIGHT breast, at middle depth, measuring approximately 5 mm greatest dimension. Mammographic images were processed with CAD. Targeted ultrasound is performed, showing an irregular hypoechoic (mixed echogenicity) mass in the RIGHT breast at the 12 o'clock axis, retroareolar, measuring 7 x 5 x 5 mm, avascular, corresponding to the mammographic finding. RIGHT axilla was evaluated with ultrasound showing no enlarged or morphologically abnormal lymph nodes. IMPRESSION: Irregular hypoechoic mass in the RIGHT breast at the 12 o'clock axis, retroareolar, measuring 7 mm, corresponding to the new mass seen on mammogram. This is a suspicious finding for which ultrasound-guided biopsy is recommended. RECOMMENDATION: Ultrasound-guided biopsy for the RIGHT breast mass at the 12 o'clock axis, retroareolar, measuring 7 mm. Ultrasound-guided biopsy is scheduled for April 16th. I have discussed the findings and recommendations with the patient. Results were also provided in writing at the conclusion of the visit. If applicable, a reminder letter will be sent to the patient regarding the next appointment. BI-RADS CATEGORY  4: Suspicious. Electronically Signed   By: Franki Cabot M.D.   On: 02/16/2018 11:09   Mm Diag Breast Tomo Uni Right  Result Date: 02/16/2018 CLINICAL DATA:  Patient returns today to evaluate a RIGHT breast mass identified on recent screening mammogram. EXAM: DIGITAL DIAGNOSTIC RIGHT MAMMOGRAM WITH CAD AND TOMO ULTRASOUND RIGHT BREAST COMPARISON:  Previous exams including recent screening mammogram dated 02/07/2018. ACR Breast Density Category b: There are scattered areas of fibroglandular density. FINDINGS: On today's additional diagnostic views, including spot compression with 3D tomosynthesis, a new oval slightly irregular mass is confirmed within the retroareolar RIGHT breast, at middle  depth, measuring approximately 5 mm greatest dimension. Mammographic images were processed with CAD. Targeted ultrasound is performed, showing an irregular hypoechoic (mixed echogenicity) mass in the RIGHT breast at the 12 o'clock axis, retroareolar, measuring 7 x 5 x 5 mm, avascular, corresponding to the mammographic finding. RIGHT axilla was evaluated with ultrasound showing no enlarged or morphologically abnormal lymph nodes. IMPRESSION: Irregular hypoechoic mass in the RIGHT breast at the 12 o'clock axis, retroareolar, measuring 7 mm, corresponding to the new mass seen on mammogram. This is a suspicious finding for which ultrasound-guided biopsy is recommended. RECOMMENDATION: Ultrasound-guided biopsy for the RIGHT breast mass at the 12 o'clock axis, retroareolar, measuring 7 mm. Ultrasound-guided biopsy is scheduled for April 16th. I have discussed the findings and recommendations with the patient. Results were also provided in writing at the conclusion of the visit. If applicable, a reminder letter will be sent to the patient regarding the next appointment. BI-RADS CATEGORY  4: Suspicious. Electronically Signed   By: Franki Cabot M.D.   On: 02/16/2018 11:09   Mm Screening Breast Tomo Uni R  Result Date: 02/07/2018 CLINICAL DATA:  Screening. EXAM: DIGITAL SCREENING UNILATERAL RIGHT MAMMOGRAM WITH CAD AND TOMO COMPARISON:  Previous exam(s). ACR Breast Density Category b: There are scattered areas of fibroglandular density. FINDINGS: In the right breast, a possible mass warrants further evaluation. In the left breast, no findings suspicious for malignancy. Images were processed with CAD. IMPRESSION: Further evaluation is suggested for  possible mass in the right breast. RECOMMENDATION: Diagnostic mammogram and possibly ultrasound of the right breast. (Code:[id number]) The patient will be contacted regarding the findings, and additional imaging will be scheduled. BI-RADS CATEGORY  0: Incomplete. Need additional  imaging evaluation and/or prior mammograms for comparison. Electronically Signed   By: Everlean Alstrom M.D.   On: 02/07/2018 14:29   Mm Clip Placement Right  Result Date: 02/21/2018 CLINICAL DATA:  Status post ultrasound-guided biopsy of a retroareolar RIGHT breast mass. EXAM: DIAGNOSTIC RIGHT MAMMOGRAM POST ULTRASOUND BIOPSY COMPARISON:  Previous exam(s). FINDINGS: Mammographic images were obtained following ultrasound guided biopsy of the RIGHT breast mass at the 12 o'clock axis, retroareolar. At the conclusion of the procedure, a ribbon shaped clip was placed at the biopsy site. The clip is positioned at the posterior margin of the targeted mass, as seen on postprocedure ultrasound images and the postprocedure mammogram. IMPRESSION: Ribbon shaped biopsy clip is positioned at the posterior margin of the targeted RIGHT breast mass, 12 o'clock axis, retroareolar. Final Assessment: Post Procedure Mammograms for Marker Placement Electronically Signed   By: Franki Cabot M.D.   On: 02/21/2018 14:56   [place] Rt Breast Bx W Loc Dev 1st Lesion Img Bx Spec US Guide  Addendum Date: 02/23/2018   ADDENDUM REPORT: 02/22/2018 15:45 ADDENDUM: Pathology revealed INVASIVE DUCTAL CARCINOMA, DUCTAL CARCINOMA IN SITU of RIGHT breast, 12 o'clock. The carcinoma partially involves a papilloma. This was found to be concordant by Dr. Franki Cabot. Pathology results were discussed with the patient by telephone. The patient reported doing well after the biopsy with tenderness at the site. Post biopsy instructions and care were reviewed and questions were answered. The patient was encouraged to call [facility] for any additional concerns. The patient was referred to [facility] on March 01, 2018. Pathology results reported by Roselind Messier, RN on 02/22/2018. Electronically Signed   By: Franki Cabot M.D.   On: 02/22/2018 15:45    Result Date: 02/23/2018 CLINICAL DATA:  Patient with a RIGHT breast mass presents today for ultrasound-guided core biopsy. EXAM: ULTRASOUND GUIDED RIGHT BREAST CORE NEEDLE BIOPSY COMPARISON:  Previous exam(s). PROCEDURE: I met with the patient and we discussed the procedure of ultrasound-guided biopsy, including benefits and alternatives. We discussed the high likelihood of a successful procedure. We discussed the risks of the procedure including infection, bleeding, tissue injury, clip migration, and inadequate sampling. Informed written consent was given. The usual time-out protocol was performed immediately prior to the procedure. Using sterile technique and 1% Lidocaine as local anesthetic, under direct ultrasound visualization, a 12 gauge spring-loaded device was used to perform biopsy of the RIGHT breast mass at the 12 o'clock axis, retroareolar,using a medial approach. At the conclusion of the procedure, a ribbon shaped tissue marker clip was deployed into the biopsy cavity. Follow-up 2-view mammogram was performed and dictated separately. IMPRESSION: Ultrasound-guided biopsy of the RIGHT breast mass at the 12 o'clock axis. No apparent complications. Electronically Signed: By: Franki Cabot M.D. On: 02/21/2018 14:34    ASSESSMENT & PLAN:  BELANNA MANRING is 62 year old post-menopausal woman, presented with screening discovered to breast cancer.   1.  Malignant neoplasm of upper-outer quadrant of right breast, Invasive Ductal Carcinoma and DCIS, cT1bN0M0 stage IA, grade I, ER/PR postive, HER2 negative --We discussed her imaging findings and the biopsy results in great details. --She is a candidate for breast conservation surgery, however she previously had left breast  mastectomy so she is more interested in right breast mastectomy. She has been seen by breast surgeon Dr. Excell Seltzer today. . -I recommend a Oncotype Dx test if her tumor is greater than 1 cm (or more than 71m with G2-3) on the surgical  sample and we'll make a decision about adjuvant chemotherapy based on the Oncotype result. Written material of this test was given to her. She is young and fit, would be a good candidate for chemotherapy if her Oncotype recurrence score is high. -If her surgical sentinel lymph node positive, I recommend mammaprint for further risk stratification and guide adjuvant chemotherapy. -The risk of recurrence depends on the stage and biology of the tumor. She is 1A stage, Grade I with ER/PR positve and HER2 negative. I discussed this is the more common type of slow growing tumor, the risk of recurrence is likely low. -She was also seen by radiation oncologist Dr. KSondra Cometoday. If she chooses mastectomy she will likely not need adjuvant Radiation Therapy unless her tumor is large or she has positive lymph nodes. However, if she chooses to preserve her breast she would benefit from RT to reduce her risk of local recurrence.   -Giving the strong ER and PR expression in her postmenopausal status, I recommend adjuvant endocrine therapy with aromatase inhibitor for a total of 5-10 years to reduce the risk of cancer recurrence. Potential benefits and side effects were discussed with patient and she is interested. She has been taking Evista since 1993 so she should tolerate Anastrzole/Letrozole well.  -We also discussed the breast cancer surveillance after her surgery. She will continue annual screening mammogram, self exam, and a routine office visit with lab and exam with [place] -I encouraged her to have healthy diet and exercise regularly -F/u after surgery   2. Previous left breast cancer in 1996.  -Pt reports she had breast cancer previously in 1996. She had a left breast mastectomy with sentinel node resection. She received adjuvant chemotherapy. She has been taking Evista since she completed chemo per her PCP Dr. LRex Kras Her oncologist was Dr. SBenay Spice  3. Obesity, OSA and dilated cardiomyopathy -Up with his  primary care physician and cardiologist Dr. TRadford Pax -Cardiomyopathy was probably related to her previous chemotherapy, recent echo showed EF 50 to 55%.  PLAN:  -Mastectomy with Dr. HExcell Seltzersoon -Oncotyple if tumor is greater than 1 cm (or >=532mwith G2 or 3) or mammaprint if lymph node is positive   -Genetic referral   -F/u after surgery to discuss adjuvant anti-estrogen therapy  No orders of the defined types were placed in this encounter.   All questions were answered. The patient knows to call the clinic with any problems, questions or concerns. I spent 45 minutes counseling the patient face to face. The total time spent in the appointment was 60 minutes and more than 50% was on counseling.   This document serves as a record of services personally performed by YaTruitt MerleMD. It was created on her behalf by DaTheresia Bougha trained medical scribe. The creation of this record is based on the scribe's personal observations and the provider's statements to them.   I have reviewed the above documentation for accuracy and completeness, and I agree with the above.    YaTruitt MerleMD 03/01/2018

## 2018-03-01 ENCOUNTER — Ambulatory Visit
Admission: RE | Admit: 2018-03-01 | Discharge: 2018-03-01 | Disposition: A | Discharge status: Home / Self Care | Source: Ambulatory Visit | Attending: Radiation Oncology | Admitting: Radiation Oncology

## 2018-03-01 ENCOUNTER — Inpatient Hospital Stay

## 2018-03-01 ENCOUNTER — Encounter: Payer: Self-pay | Admitting: Hematology

## 2018-03-01 ENCOUNTER — Inpatient Hospital Stay: Attending: Hematology | Admitting: Hematology

## 2018-03-01 ENCOUNTER — Ambulatory Visit: Payer: Self-pay | Admitting: General Surgery

## 2018-03-01 VITALS — BP 135/88 | HR 76 | Temp 97.6°F | Resp 16 | Ht 64.0 in | Wt 229.0 lb

## 2018-03-01 DIAGNOSIS — C50911 Malignant neoplasm of unspecified site of right female breast: Secondary | ICD-10-CM

## 2018-03-01 DIAGNOSIS — Z9012 Acquired absence of left breast and nipple: Secondary | ICD-10-CM | POA: Diagnosis not present

## 2018-03-01 DIAGNOSIS — Z853 Personal history of malignant neoplasm of breast: Secondary | ICD-10-CM | POA: Diagnosis not present

## 2018-03-01 DIAGNOSIS — Z170 Estrogen receptor positive status [ER+]: Secondary | ICD-10-CM | POA: Diagnosis not present

## 2018-03-01 DIAGNOSIS — I10 Essential (primary) hypertension: Secondary | ICD-10-CM

## 2018-03-01 DIAGNOSIS — G4733 Obstructive sleep apnea (adult) (pediatric): Secondary | ICD-10-CM | POA: Diagnosis not present

## 2018-03-01 DIAGNOSIS — I429 Cardiomyopathy, unspecified: Secondary | ICD-10-CM | POA: Diagnosis not present

## 2018-03-01 DIAGNOSIS — C50411 Malignant neoplasm of upper-outer quadrant of right female breast: Secondary | ICD-10-CM

## 2018-03-01 DIAGNOSIS — E669 Obesity, unspecified: Secondary | ICD-10-CM | POA: Diagnosis not present

## 2018-03-01 NOTE — Progress Notes (Signed)
Radiation Oncology         [phone] ________________________________  Multidisciplinary Breast Oncology Clinic [facility]) Initial Outpatient Consultation  Name: Meagan Ryan MRN: [id number]  Date: 03/01/2018  DOB: December 04, 1955  Meagan Ryan  Meagan Ryan   REFERRING PHYSICIAN: Excell Seltzer, Ryan  DIAGNOSIS: The encounter diagnosis was Malignant neoplasm of upper-outer quadrant of right breast in female, estrogen receptor positive ([place]). 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 Malignant neoplasm of upper-outer quadrant of right breast in female, estrogen receptor positive ([place]) C50.411    Z17.0     HISTORY OF PRESENT ILLNESS::Meagan Ryan is a 62 y.o. female who is presenting to the office today for evaluation of her newly diagnosed breast cancer.   She had routine screening mammography on 4/2/209 showing: Further evaluation is suggested for possible mass in the right breast. She underwent unilateral right diagnostic mammography with tomography and right breast ultrasonography at [facility] on 02/16/2018 showing: Breast density category B. Irregular hypoechoic mass in the RIGHT breast at the 12 o'clock axis, retroareolar, measuring 7 mm, corresponding to the new mass seen on mammogram. This is a suspicious finding for which ultrasound-guided biopsy is recommended.  Accordingly on 02/21/2018 she proceeded to biopsy of the right breast area in question. The pathology from this procedure showed: Breast, right, needle core biopsy, 12 o'clock with invasive ductal carcinoma. Ductal carcinoma in situ. Prognostic indicators significant for: estrogen receptor, 100% positive and progesterone receptor, 100% positive, both with strong staining intensity. Proliferation marker Ki67 at 5%. HER2 negative.  On review of systems, She reports fatigue, hearing loss, tinnitus, SOB, breast pain,  and hot flashes. She denies any other symptoms.    Menarche: 62 years old Age at first live birth: 62 years old GP: GxP1 LMP: at age 7 Contraceptive: for approximately 9 years starting in 1974 HRT: No   The patient was referred today for presentation in the multidisciplinary conference.  Radiology studies and pathology slides were presented there for review and discussion of treatment options.  A consensus was discussed regarding potential next steps.  PREVIOUS RADIATION THERAPY: No  PAST MEDICAL HISTORY:  has a past medical history of Allergic rhinitis, Asthma, Breast cancer ([place]) (1996), CHF (congestive heart failure) ([place]), Chronic combined systolic and diastolic heart failure ([place]) (68/34/1962), Complication of anesthesia, DCM (dilated cardiomyopathy) ([place]), Dysuria (2011), Family history of adverse reaction to anesthesia, Fatty liver (CT 1/12), H/O actinic keratosis, H/O allergic rhinitis, H/O fibromyalgia, H/O osteopenia (2009), H/O peripheral neuropathy, H/O varicose veins, History of IBS, colonic polyps, migraines, ovarian cyst, Hyperlipidemia, Hypertension, Incomplete RBBB, Meniere disease, OSA (obstructive sleep apnea) (12/31/2015), Pelvic pain in female, PMB (postmenopausal bleeding) (2011), PONV (postoperative nausea and vomiting), Pregnancy induced hypertension, Right-sided lacunar infarction ([place]), and Tubular adenoma of colon.    PAST SURGICAL HISTORY: Past Surgical History:  Procedure Laterality Date  . gastroc muscle repair  2007  . LAPAROSCOPIC CHOLECYSTECTOMY    . MASTECTOMY  06/1995   Left - for infiltrating ductal carcinoma  . 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 ROBOTIC ASSISTED TOTAL HYSTERECTOMY WITH BILATERAL SALPINGO OOPHERECTOMY Bilateral 10/09/2015   Procedure: ROBOTIC ASSISTED BILATERAL SALPINGO OOPHORECTOMY ;  Surgeon: Meagan Amber, Ryan;  Location: [facility];  Service: Gynecology;  Laterality: Bilateral;  . tram surgery  1996  . TUBAL LIGATION  FAMILY HISTORY: family history includes Cancer in her father and sister; Diabetes in her mother; Heart disease in her mother; Hypertension in her mother; Stroke in her father and mother.  SOCIAL HISTORY:  reports that she has never smoked. She has never used smokeless tobacco. She reports that she does not drink alcohol or use drugs.  ALLERGIES: Amlodipine; Norvasc [amlodipine besylate]; Cephalexin; Codeine; Noroxin [norfloxacin]; Sulfa antibiotics; Sulfamethoxazole; Latex; and Penicillins  MEDICATIONS:  Current Outpatient Medications  Medication Sig Dispense Refill  . ALPRAZolam (XANAX) 0.5 MG tablet Take 0.25 mg by mouth 2 (two) times daily.     Marland Kitchen azelastine (ASTELIN) 0.1 % nasal spray Place 1 spray into both nostrils 2 (two) times daily as needed for rhinitis or allergies.     . carvedilol (COREG) 3.125 MG tablet Take 1 tablet (3.125 mg total) by mouth 2 (two) times daily with a meal. 180 tablet 3  . cetirizine (ZYRTEC) 10 MG tablet Take 10 mg by mouth every morning.     . DULoxetine (CYMBALTA) 30 MG capsule Take 30 mg by mouth at bedtime.     . fluticasone (FLONASE) 50 MCG/ACT nasal spray Place 1 spray into both nostrils every morning.     . gabapentin (NEURONTIN) 300 MG capsule Take 300 mg by mouth 2 (two) times daily.    . ivabradine (CORLANOR) 5 MG TABS tablet Take 1 tablet (5 mg total) by mouth 2 (two) times daily with a meal. 180 tablet 3  . niacin (NIASPAN) 500 MG CR tablet Take 500 mg by mouth every morning.     . nortriptyline (PAMELOR) 25 MG capsule Take 25 mg by mouth at bedtime as needed for sleep. For sleep    . omeprazole (PRILOSEC) 20 MG capsule Take 20 mg by mouth daily.    . potassium chloride (K-DUR) 10 MEQ tablet Take 10 mEq by mouth daily as needed (when taking lasix).     . raloxifene (EVISTA) 60 MG tablet Take 60 mg by mouth every morning.     . sacubitril-valsartan (ENTRESTO) 24-26 MG Take 1 tablet by mouth 2 (two) times daily. 180 tablet 3   No current  facility-administered medications for this encounter.     REVIEW OF SYSTEMS:  REVIEW OF SYSTEMS: A 10+ POINT REVIEW OF SYSTEMS WAS OBTAINED including neurology, dermatology, psychiatry, cardiac, respiratory, lymph, extremities, GI, GU, musculoskeletal, constitutional, reproductive, HEENT. All pertinent positives are noted in the HPI. All others are negative.   PHYSICAL EXAM:  Vitals with BMI 03/01/2018  Height 5' 4"   Weight 229 lbs  BMI 64.40  Systolic 347  Diastolic 88  Pulse 76  Respirations 16   General: Alert and oriented, in no acute distress HEENT: Head is normocephalic. Extraocular movements are intact. Oropharynx is clear. Neck: Neck is supple, no palpable cervical or supraclavicular lymphadenopathy. Heart: Regular in rate and rhythm with no murmurs, rubs, or gallops. Chest: Clear to auscultation bilaterally, with no rhonchi, wheezes, or rales. Abdomen: Soft, nontender, nondistended, with no rigidity or guarding. She has a scar in the RUQ of the abdomen from prior cholesectomy and kidney removal.  Extremities: No cyanosis or edema. Lymphatics: see Neck Exam Skin: No concerning lesions. Musculoskeletal: symmetric strength and muscle tone throughout. Neurologic: Cranial nerves II through XII are grossly intact. No obvious focalities. Speech is fluent. Coordination is intact. Psychiatric: Judgment and insight are intact. Affect is appropriate. Breast: Left chest with (tram-flap reconstruction) breast. Right breast the patient has a lot of brusiing in the medial aspect of the  breast at approximately the 3 o'clock position. No dominant mass, nipple discharge, or bleeding. Small scar in right upper chest from remote port-a-cath placement.     KPS = 90  100 - Normal; no complaints; no evidence of disease. 90   - Able to carry on normal activity; minor signs or symptoms of disease. 80   - Normal activity with effort; some signs or symptoms of disease. 29   - Cares for self; unable  to carry on normal activity or to do active work. 60   - Requires occasional assistance, but is able to care for most of his personal needs. 50   - Requires considerable assistance and frequent medical care. 29   - Disabled; requires special care and assistance. 56   - Severely disabled; hospital admission is indicated although death not imminent. 76   - Very sick; hospital admission necessary; active supportive treatment necessary. 10   - Moribund; fatal processes progressing rapidly. 0     - Dead  Karnofsky DA, Abelmann Elmo, Craver LS and Burchenal JH [phone]) The use of the nitrogen mustards in the palliative treatment of carcinoma: with particular reference to bronchogenic carcinoma Cancer 1 634-56  LABORATORY DATA:  Lab Results  Component Value Date   WBC 5.5 12/14/2017   HGB 13.7 12/14/2017   HCT 41.9 12/14/2017   MCV 91.5 12/14/2017   PLT 206 12/14/2017   Lab Results  Component Value Date   NA 137 12/14/2017   K 3.8 12/14/2017   CL 104 12/14/2017   CO2 22 12/14/2017   Lab Results  Component Value Date   ALT 31 09/30/2015   AST 44 (H) 09/30/2015   ALKPHOS 85 09/30/2015   BILITOT 0.8 09/30/2015    PULMONARY FUNCTION TEST:   Recent Review Flowsheet Data    There is no flowsheet data to display.      RADIOGRAPHY: US Breast Ltd Uni Right Inc Axilla  Result Date: 02/16/2018 CLINICAL DATA:  Patient returns today to evaluate a RIGHT breast mass identified on recent screening mammogram. EXAM: DIGITAL DIAGNOSTIC RIGHT MAMMOGRAM WITH CAD AND TOMO ULTRASOUND RIGHT BREAST COMPARISON:  Previous exams including recent screening mammogram dated 02/07/2018. ACR Breast Density Category b: There are scattered areas of fibroglandular density. FINDINGS: On today's additional diagnostic views, including spot compression with 3D tomosynthesis, a new oval slightly irregular mass is confirmed within the retroareolar RIGHT breast, at middle depth, measuring approximately 5 mm greatest dimension.  Mammographic images were processed with CAD. Targeted ultrasound is performed, showing an irregular hypoechoic (mixed echogenicity) mass in the RIGHT breast at the 12 o'clock axis, retroareolar, measuring 7 x 5 x 5 mm, avascular, corresponding to the mammographic finding. RIGHT axilla was evaluated with ultrasound showing no enlarged or morphologically abnormal lymph nodes. IMPRESSION: Irregular hypoechoic mass in the RIGHT breast at the 12 o'clock axis, retroareolar, measuring 7 mm, corresponding to the new mass seen on mammogram. This is a suspicious finding for which ultrasound-guided biopsy is recommended. RECOMMENDATION: Ultrasound-guided biopsy for the RIGHT breast mass at the 12 o'clock axis, retroareolar, measuring 7 mm. Ultrasound-guided biopsy is scheduled for April 16th. I have discussed the findings and recommendations with the patient. Results were also provided in writing at the conclusion of the visit. If applicable, a reminder letter will be sent to the patient regarding the next appointment. BI-RADS CATEGORY  4: Suspicious. Electronically Signed   By: Franki Cabot M.D.   On: 02/16/2018 11:09   Mm Diag Breast Tomo Uni Right  Result Date: 02/16/2018 CLINICAL DATA:  Patient returns today to evaluate a RIGHT breast mass identified on recent screening mammogram. EXAM: DIGITAL DIAGNOSTIC RIGHT MAMMOGRAM WITH CAD AND TOMO ULTRASOUND RIGHT BREAST COMPARISON:  Previous exams including recent screening mammogram dated 02/07/2018. ACR Breast Density Category b: There are scattered areas of fibroglandular density. FINDINGS: On today's additional diagnostic views, including spot compression with 3D tomosynthesis, a new oval slightly irregular mass is confirmed within the retroareolar RIGHT breast, at middle depth, measuring approximately 5 mm greatest dimension. Mammographic images were processed with CAD. Targeted ultrasound is performed, showing an irregular hypoechoic (mixed echogenicity) mass in the  RIGHT breast at the 12 o'clock axis, retroareolar, measuring 7 x 5 x 5 mm, avascular, corresponding to the mammographic finding. RIGHT axilla was evaluated with ultrasound showing no enlarged or morphologically abnormal lymph nodes. IMPRESSION: Irregular hypoechoic mass in the RIGHT breast at the 12 o'clock axis, retroareolar, measuring 7 mm, corresponding to the new mass seen on mammogram. This is a suspicious finding for which ultrasound-guided biopsy is recommended. RECOMMENDATION: Ultrasound-guided biopsy for the RIGHT breast mass at the 12 o'clock axis, retroareolar, measuring 7 mm. Ultrasound-guided biopsy is scheduled for April 16th. I have discussed the findings and recommendations with the patient. Results were also provided in writing at the conclusion of the visit. If applicable, a reminder letter will be sent to the patient regarding the next appointment. BI-RADS CATEGORY  4: Suspicious. Electronically Signed   By: Franki Cabot M.D.   On: 02/16/2018 11:09   Mm Screening Breast Tomo Uni R  Result Date: 02/07/2018 CLINICAL DATA:  Screening. EXAM: DIGITAL SCREENING UNILATERAL RIGHT MAMMOGRAM WITH CAD AND TOMO COMPARISON:  Previous exam(s). ACR Breast Density Category b: There are scattered areas of fibroglandular density. FINDINGS: In the right breast, a possible mass warrants further evaluation. In the left breast, no findings suspicious for malignancy. Images were processed with CAD. IMPRESSION: Further evaluation is suggested for possible mass in the right breast. RECOMMENDATION: Diagnostic mammogram and possibly ultrasound of the right breast. (Code:[id number]) The patient will be contacted regarding the findings, and additional imaging will be scheduled. BI-RADS CATEGORY  0: Incomplete. Need additional imaging evaluation and/or prior mammograms for comparison. Electronically Signed   By: Everlean Alstrom M.D.   On: 02/07/2018 14:29   Mm Clip Placement Right  Result Date: 02/21/2018 CLINICAL DATA:   Status post ultrasound-guided biopsy of a retroareolar RIGHT breast mass. EXAM: DIAGNOSTIC RIGHT MAMMOGRAM POST ULTRASOUND BIOPSY COMPARISON:  Previous exam(s). FINDINGS: Mammographic images were obtained following ultrasound guided biopsy of the RIGHT breast mass at the 12 o'clock axis, retroareolar. At the conclusion of the procedure, a ribbon shaped clip was placed at the biopsy site. The clip is positioned at the posterior margin of the targeted mass, as seen on postprocedure ultrasound images and the postprocedure mammogram. IMPRESSION: Ribbon shaped biopsy clip is positioned at the posterior margin of the targeted RIGHT breast mass, 12 o'clock axis, retroareolar. Final Assessment: Post Procedure Mammograms for Marker Placement Electronically Signed   By: Franki Cabot M.D.   On: 02/21/2018 14:56   [place] Rt Breast Bx W Loc Dev 1st Lesion Img Bx Spec US Guide  Addendum Date: 02/23/2018   ADDENDUM REPORT: 02/22/2018 15:45 ADDENDUM: Pathology revealed INVASIVE DUCTAL CARCINOMA, DUCTAL CARCINOMA IN SITU of RIGHT breast, 12 o'clock. The carcinoma partially involves a papilloma. This was found to be concordant by Dr. Franki Cabot. Pathology results were discussed with the patient by telephone. The patient reported doing well after the  biopsy with tenderness at the site. Post biopsy instructions and care were reviewed and questions were answered. The patient was encouraged to call [facility] for any additional concerns. The patient was referred to [facility] on March 01, 2018. Pathology results reported by Roselind Messier, RN on 02/22/2018. Electronically Signed   By: Franki Cabot M.D.   On: 02/22/2018 15:45   Result Date: 02/23/2018 CLINICAL DATA:  Patient with a RIGHT breast mass presents today for ultrasound-guided core biopsy. EXAM: ULTRASOUND GUIDED RIGHT BREAST CORE NEEDLE BIOPSY COMPARISON:  Previous  exam(s). PROCEDURE: I met with the patient and we discussed the procedure of ultrasound-guided biopsy, including benefits and alternatives. We discussed the high likelihood of a successful procedure. We discussed the risks of the procedure including infection, bleeding, tissue injury, clip migration, and inadequate sampling. Informed written consent was given. The usual time-out protocol was performed immediately prior to the procedure. Using sterile technique and 1% Lidocaine as local anesthetic, under direct ultrasound visualization, a 12 gauge spring-loaded device was used to perform biopsy of the RIGHT breast mass at the 12 o'clock axis, retroareolar,using a medial approach. At the conclusion of the procedure, a ribbon shaped tissue marker clip was deployed into the biopsy cavity. Follow-up 2-view mammogram was performed and dictated separately. IMPRESSION: Ultrasound-guided biopsy of the RIGHT breast mass at the 12 o'clock axis. No apparent complications. Electronically Signed: By: Franki Cabot M.D. 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   We discussed the general indication of post-mastectomy radiation being, margin status, tumor size, and lymph node positivity.       PLAN:  1. Genetics, 03/06/2018 2. Definitive surgery, most likely mastectomy (patient's decision) 3. Aromatase Inhibitor      ------------------------------------------------  Blair Promise, PhD, Ryan   This document serves as a record of services personally performed by Gery Pray, Ryan. It was created on his behalf by Steva Colder, a trained medical scribe. The creation of this record is based on the scribe's personal observations and the provider's statements to them. This document has been checked and approved by the attending provider.

## 2018-03-02 ENCOUNTER — Encounter: Payer: Self-pay | Admitting: General Practice

## 2018-03-02 ENCOUNTER — Telehealth: Payer: Self-pay | Admitting: Hematology

## 2018-03-02 NOTE — Telephone Encounter (Signed)
No LOS 4/24 °

## 2018-03-02 NOTE — Progress Notes (Signed)
[place] presented to Breast Multidisciplinary Clinic to introduce [place] team/resources, completing distress screen per protocol.  The patient scored a 2 on the Psychosocial Distress Thermometer which indicates mild distress.   ONCBCN DISTRESS SCREENING 03/02/2018  Screening Type Initial Screening  Distress experienced in past week (1-10) 2  Emotional problem type Nervousness/Anxiety  Information Concerns Type Lack of info about diagnosis;Lack of info about treatment;Lack of info about complementary therapy choices  Physical Problem type Breathing  Referral to support programs Yes    Follow up needed: No. Full packet of [place] team/programming provided. Please page if circumstances change or immediate needs arise. Thank you.   [place], [place], [facility] Pager [phone] Voicemail [phone]

## 2018-03-06 ENCOUNTER — Inpatient Hospital Stay (HOSPITAL_BASED_OUTPATIENT_CLINIC_OR_DEPARTMENT_OTHER): Admitting: Genetic Counselor

## 2018-03-06 ENCOUNTER — Inpatient Hospital Stay

## 2018-03-06 ENCOUNTER — Encounter: Payer: Self-pay | Admitting: Genetic Counselor

## 2018-03-06 DIAGNOSIS — C50411 Malignant neoplasm of upper-outer quadrant of right female breast: Secondary | ICD-10-CM | POA: Diagnosis not present

## 2018-03-06 DIAGNOSIS — Z853 Personal history of malignant neoplasm of breast: Secondary | ICD-10-CM | POA: Diagnosis not present

## 2018-03-06 DIAGNOSIS — Z170 Estrogen receptor positive status [ER+]: Secondary | ICD-10-CM | POA: Diagnosis not present

## 2018-03-06 DIAGNOSIS — Z803 Family history of malignant neoplasm of breast: Secondary | ICD-10-CM | POA: Diagnosis not present

## 2018-03-06 NOTE — Progress Notes (Signed)
[facility]      Initial Visit   Patient Name: Meagan Ryan Patient DOB: 1956-03-31 Patient Age: 62 y.o. Encounter Date: 03/06/2018  Referring Provider: Truitt Merle, MD  Primary Care Provider: Hulan Fess, MD  Reason for Visit: Evaluate for hereditary susceptibility to cancer    Assessment and Plan:  . Meagan Ryan's history of bilateral breast cancers at ages 1 and 7 is somewhat suggestive of a hereditary predisposition to cancer, but she has many unaffected family members. Given her young age at initial diagnosis, a genetics evaluation is indicated.   . Testing is recommended to determine whether she has a pathogenic mutation that will impact her screening and risk-reduction for cancer. A negative result will be reassuring.  . Meagan Ryan wished to pursue genetic testing and a blood sample will be sent for analysis of the 47 genes on [facility]'s Common Cancers panel (APC, ATM, AXIN2, BARD1, BMPR1A, BRCA1, BRCA2, BRIP1, CDH1, CDK4, CDKN2A, CHEK2, CTNNA1, DICER1, EPCAM, GREM1, HOXB13, KIT, MEN1, MLH1, MSH2, MSH3, MSH6, MUTYH, NBN, NF1, NTHL1, PALB2, PDGFRA, PMS2, POLD1, POLE, PTEN, RAD50, RAD51C, RAD51D, SDHA, SDHB, SDHC, SDHD, SMAD4, SMARCA4, STK11, TP53, TSC1, TSC2, VHL).   . Results should be available in approximately 2-4 weeks, at which point we will contact her and address implications for her as well as address genetic testing for at-risk family members, if needed.     Dr. Burr Medico was available for questions concerning this case. Total time spent by me in face-to-face counseling was approximately 30 minutes.   _____________________________________________________________________   History of Present Illness: Ms. CANDELA Ryan, a 62 y.o. female, is being seen at the [facility] due to a personal and family history of breast cancer. She presents to clinic today to discuss the possibility of a hereditary predisposition to cancer and  discuss whether genetic testing is warranted.  Meagan Ryan was recently diagnosed with right breast cancer at the age of 53. She is stated that she is pursuing a right-sided mastectomy. At age 10, she had a left breast cancer also treated with mastectomy.  Oncology History   Cancer Staging Malignant neoplasm of upper-outer quadrant of right breast in female, estrogen receptor positive ([place]) Staging form: Breast, AJCC 8th Edition - Clinical stage from 02/21/2018: Stage IA (cT1b, cN0, cM0, G1, ER+, PR+, HER2-) - Signed by Truitt Merle, MD on 03/01/2018       Malignant neoplasm of upper-outer quadrant of right breast in female, estrogen receptor positive ([place])   02/16/2018 Mammogram    IMPRESSION: Irregular hypoechoic mass in the RIGHT breast at the 12 o'clock axis, retroareolar, measuring 7 mm, corresponding to the new mass seen on mammogram. This is a suspicious finding for which ultrasound-guided biopsy is recommended.      02/21/2018 Initial Biopsy    Diagnosis 02/21/18 Breast, right, needle core biopsy, 12 o'clock - INVASIVE DUCTAL CARCINOMA. - DUCTAL CARCINOMA IN SITU. - SEE COMMENT.      02/21/2018 Receptors her2    Prognostic indicators significant for: ER, 100% positive and PR, 100% positive or negative, both with strong staining intensity. Proliferation marker Ki67 at 5%. HER2 negative.      02/21/2018 Cancer Staging    Staging form: Breast, AJCC 8th Edition - Clinical stage from 02/21/2018: Stage IA (cT1b, cN0, cM0, G1, ER+, PR+, HER2-) - Signed by Truitt Merle, MD on 03/01/2018      02/23/2018 Initial Diagnosis    Malignant neoplasm of upper-outer quadrant  of right breast in female, estrogen receptor positive ([place])        Past Medical History:  Diagnosis Date  . Allergic rhinitis   . Asthma   . Breast cancer ([place]) 1996   left breast-stage II infiltrating ductal carcinoma, 3/8 axillary lymph nodes were positive for metastatic carcinoma, getting yearly mammogram and every  other year MRI followed by oncologist Dr Benay Spice  . CHF (congestive heart failure) ([place])   . Chronic combined systolic and diastolic heart failure ([place]) 02/26/2016  . Complication of anesthesia    WAKE UP WITH HEADACHE  . DCM (dilated cardiomyopathy) ([place])    EF normalized at 60-65% on echo 07/2016  . Dysuria 2011  . Family history of adverse reaction to anesthesia    "family wakes up with personality changes, but are very short term"  . Family history of breast cancer   . Fatty liver CT 1/12   elevated LFT  . H/O actinic keratosis   . H/O allergic rhinitis   . H/O fibromyalgia   . H/O osteopenia 2009  . H/O peripheral neuropathy   . H/O varicose veins   . History of breast cancer   . History of IBS   . Hx of colonic polyps   . Hx of migraines   . Hx of ovarian cyst   . Hyperlipidemia   . Hypertension   . Incomplete RBBB   . 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 OSA (obstructive sleep apnea) 12/31/2015   Very mild with AHI 6/hr but elevated RDI at 32/hr.  . Pelvic pain in female   . PMB (postmenopausal bleeding) 2011  . PONV (postoperative nausea and vomiting)   . Pregnancy induced hypertension   . Right-sided lacunar infarction ([place])    MRI 1.2012  . Tubular adenoma of colon     Past Surgical History:  Procedure Laterality Date  . gastroc muscle repair  2007  . LAPAROSCOPIC CHOLECYSTECTOMY    . MASTECTOMY  06/1995   Left - for infiltrating ductal carcinoma  . 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 ROBOTIC ASSISTED TOTAL HYSTERECTOMY WITH BILATERAL SALPINGO OOPHERECTOMY Bilateral 10/09/2015   Procedure: ROBOTIC ASSISTED BILATERAL SALPINGO OOPHORECTOMY ;  Surgeon: Everitt Amber, MD;  Location: [facility];  Service: Gynecology;  Laterality: Bilateral;  . tram surgery  1996  . TUBAL LIGATION      Social History   Socioeconomic History  . Marital status: Widowed    Spouse name: Not on file  . Number of children: Not on file  . Years of education: Not  on file  . Highest education level: Not on file  Occupational History  . Not on file  Social Needs  . Financial resource strain: Not on file  . Food insecurity:    Worry: Not on file    Inability: Not on file  . Transportation needs:    Medical: Not on file    Non-medical: Not on file  Tobacco Use  . Smoking status: Never Smoker  . Smokeless tobacco: Never Used  Substance and Sexual Activity  . Alcohol use: No  . Drug use: No  . Sexual activity: Not on file  Lifestyle  . Physical activity:    Days per week: Not on file    Minutes per session: Not on file  . Stress: Not on file  Relationships  . Social connections:    Talks on phone: Not on file    Gets  together: Not on file    Attends religious service: Not on file    Active member of club or organization: Not on file    Attends meetings of clubs or organizations: Not on file    Relationship status: Not on file  Other Topics Concern  . 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 Family tree will be scanned in the Media tab in Epic  Significant diagnoses include the following:  Family History  Problem Relation Age of Onset  . Heart disease Mother        MI  . Stroke Mother   . Diabetes Mother   . Hypertension Mother   . Stroke Father   . Prostate cancer Father 70       deceased 36  . Colon cancer Sister 83       currently 37  . Breast cancer Paternal Aunt 16       deceased 46s  . Breast cancer Cousin        daughter of pat aunt w/ breast ca  . Breast cancer Cousin        daughter of pat aunt w/ breast ca    Additionally, Ms. Sobocinski has one daughter (age 66). She has 2 sisters; one  (age 37) is noted above with colon cancer. The other (age 56) is cancer-free. Her mother died at 7. She had 3 brothers. Her father (noted above) had 4 sisters and a brother.  Ms. Gunnels ancestry is Caucasian - NOS. There is no known Jewish ancestry and no consanguinity.  Discussion: We reviewed the characteristics, features and inheritance patterns of hereditary cancer syndromes. We discussed her risk of harboring a mutation in the context of her personal and family history. We discussed the process of genetic testing, insurance coverage and implications of results: positive, negative and variant of unknown significance (VUS).    Ms. Armel questions were answered to her satisfaction today and she is welcome to call with any additional questions or concerns. Thank you for the referral and allowing [place] to share in the care of your patient.    Steele Berg, MS, [place] Certified Genetic Counselor phone: [phone] [email or web address]

## 2018-03-07 ENCOUNTER — Telehealth: Payer: Self-pay | Admitting: Cardiology

## 2018-03-07 NOTE — Telephone Encounter (Signed)
I returned call to [place]. I explained that the performing surgeon's office would need to fax over surgical clearance form and we route to the surgical pool for review or they will call if patient needs an office visit. She states she will call office to inquire about a surgical clearance form and have them complete and fax to our office. She was thankful for my assistance.

## 2018-03-07 NOTE — Telephone Encounter (Signed)
New message:       Pt's daughter is calling to see if we have received clearance papers for her mom to be able to have a mastectomy.

## 2018-03-08 ENCOUNTER — Telehealth: Payer: Self-pay | Admitting: *Deleted

## 2018-03-08 NOTE — Telephone Encounter (Signed)
Pt is aware and agreeable to clearance OV on 5/6 with Lyda Jester, PA-C @ 8:30 am. Will fax to performing surgeon.

## 2018-03-08 NOTE — Telephone Encounter (Signed)
   Primary Cardiologist:Traci Turner, MD (Last seen 03/2017)   Chart reviewed as part of pre-operative protocol coverage. Because of Meagan Ryan's past medical history and time since last visit, he/she will require a follow-up visit in order to better assess preoperative cardiovascular risk.  Pre-op covering staff: - Please schedule appointment and call patient to inform them. - Please contact requesting surgeon's office via preferred method (i.e, phone, fax) to inform them of need for appointment prior to surgery.  [place], [place]  03/08/2018, 3:22 PM

## 2018-03-08 NOTE — Telephone Encounter (Signed)
   [facility] Pre-operative Risk Assessment    Request for surgical clearance:  1. What type of surgery is being performed? Right total mastectomy   2. When is this surgery scheduled? Not scheduled.  Needs written clearance in order to get a surgery date   3. What type of clearance is required (medical clearance vs. Pharmacy clearance to hold med vs. Both)? medical  4. Are there any medications that need to be held prior to surgery and how long? --We need instructions from you as to if the patient should hold medication preoperatively. Please Advise   5. Practice name and name of physician performing surgery? [facility]. Dr. Excell Seltzer   6. What is your office phone number [phone]    7.   What is your office fax [phone] (Attn:Patricia McCaulley, [place])  8.   Anesthesia type (None, local, MAC, general) ? General    _________________________________________________________________   (provider comments below)  Please call to advise if this patient will require an office visit or further medical work up before clearance can be given.  Please call [phone] and leave a message with the triage nurse.

## 2018-03-08 NOTE — Telephone Encounter (Signed)
Attempted to call patient. There was no voicemail.

## 2018-03-13 ENCOUNTER — Ambulatory Visit (INDEPENDENT_AMBULATORY_CARE_PROVIDER_SITE_OTHER): Admitting: Cardiology

## 2018-03-13 ENCOUNTER — Ambulatory Visit: Payer: Self-pay | Admitting: Genetic Counselor

## 2018-03-13 ENCOUNTER — Encounter: Payer: Self-pay | Admitting: Cardiology

## 2018-03-13 ENCOUNTER — Encounter: Payer: Self-pay | Admitting: Genetic Counselor

## 2018-03-13 ENCOUNTER — Encounter (INDEPENDENT_AMBULATORY_CARE_PROVIDER_SITE_OTHER): Payer: Self-pay

## 2018-03-13 VITALS — BP 130/78 | HR 69 | Ht 64.0 in | Wt 227.8 lb

## 2018-03-13 DIAGNOSIS — Z01818 Encounter for other preprocedural examination: Secondary | ICD-10-CM | POA: Diagnosis not present

## 2018-03-13 DIAGNOSIS — Z1379 Encounter for other screening for genetic and chromosomal anomalies: Secondary | ICD-10-CM

## 2018-03-13 HISTORY — DX: Encounter for other screening for genetic and chromosomal anomalies: Z13.79

## 2018-03-13 NOTE — Progress Notes (Signed)
03/13/2018 ROCKY RISHEL   July 19, 1956  [id number]  Primary Physician Hulan Fess, MD Primary Cardiologist: Dr. Radford Pax   Reason for Visit/CC: Preoperative Cardiovascular Assessment   HPI:  Meagan Ryan is a 62 y.o. female who is being seen today for preoperative cardiovascular assessment prior to left Mastectomy for breast cancer. She is followed by Dr. Radford Pax and has a h/o DCM, secondary to chemotherapy and HTN. She has h/o breast cancer with recurrence.  Her cardiomyopathy was diagnosed in October 2016.  Echo at that time showed reduced EF down to 30 to 35%.  Subsequently, she underwent further ischemic evaluation with a coronary CTA with morphology done on September 04, 2015.  This showed a coronary calcium score of 0.  Normal coronary origin with right dominance.  There was no evidence of CAD, making the diagnosis of nonischemic cardiomyopathy.  She was placed on medical therapy. She has been on a BB and Entresto along with Corlanor. She had a repeat echocardiogram December 14, 2017 which showed improvement in EF to 50 to 55%.  Grade 2 diastolic dysfunction was also noted.  Her valvular function was normal.  Her only other risk factor is hypertension which is well controlled with medical therapy.  Blood pressure today is 130/78.  Her EKG shows normal sinus rhythm, unchanged compared to previous EKGs. She reports a remote h/o CVA. No h/o DM.   She reports that she has done well since her last office visit.  She denies any cardiac symptoms.  No anginal symptomatology.  She denies chest pain and dyspnea.  She is able to ambulate a flight of stairs, 4 METs of physical activity, without exertional chest pain or dyspnea.  She denies orthopnea, PND, lower extremity edema, palpitations, lightheadedness, dizziness.  She reports full medication compliance.  She had basic laboratory work at her PCP office in April 2019 which showed normal renal function.  Serum creatinine was 0.9.   Current Meds  Medication  Sig  . ALPRAZolam (XANAX) 0.5 MG tablet Take 0.25 mg by mouth 2 (two) times daily.   [place] Kitchen azelastine (ASTELIN) 0.1 % nasal spray Place 1 spray into both nostrils 2 (two) times daily as needed for rhinitis or allergies.   . carvedilol (COREG) 3.125 MG tablet Take 1 tablet (3.125 mg total) by mouth 2 (two) times daily with a meal.  . cetirizine (ZYRTEC) 10 MG tablet Take 10 mg by mouth every morning.   . DULoxetine (CYMBALTA) 30 MG capsule Take 30 mg by mouth at bedtime.   . fluticasone (FLONASE) 50 MCG/ACT nasal spray Place 1 spray into both nostrils every morning.   . gabapentin (NEURONTIN) 300 MG capsule Take 300 mg by mouth 2 (two) times daily.  . ivabradine (CORLANOR) 5 MG TABS tablet Take 1 tablet (5 mg total) by mouth 2 (two) times daily with a meal.  . niacin (NIASPAN) 500 MG CR tablet Take 500 mg by mouth every morning.   . nortriptyline (PAMELOR) 25 MG capsule Take 25 mg by mouth at bedtime as needed for sleep. For sleep  . omeprazole (PRILOSEC) 20 MG capsule Take 20 mg by mouth daily.  . potassium chloride (K-DUR) 10 MEQ tablet Take 10 mEq by mouth daily as needed (when taking lasix).   . raloxifene (EVISTA) 60 MG tablet Take 60 mg by mouth every morning.   . sacubitril-valsartan (ENTRESTO) 24-26 MG Take 1 tablet by mouth 2 (two) times daily.   Allergies  Allergen Reactions  . Amlodipine Shortness Of Breath  .  Norvasc [Amlodipine Besylate] Shortness Of Breath    Breathing difficulties  . Cephalexin Other (See Comments) and Nausea And Vomiting    Fever got to 105 High temperature  . Codeine Nausea And Vomiting  . Noroxin [Norfloxacin]     Pt do not remember  . Sulfa Antibiotics Nausea And Vomiting  . Sulfamethoxazole Nausea And Vomiting  . Latex Itching and Rash    redness  . Penicillins Rash    Has patient had a PCN reaction causing immediate rash, facial/tongue/throat swelling, SOB or lightheadedness with hypotension:No Has patient had a PCN reaction causing severe rash  involving mucus membranes or skin necrosis: Yes Has patient had a PCN reaction that required hospitalization No Has patient had a PCN reaction occurring within the last 10 years: No If all of the above answers are "NO", then may proceed with Cephalosporin use.    Past Medical History:  Diagnosis Date  . Allergic rhinitis   . Asthma   . Breast cancer ([place]) 1996   left breast-stage II infiltrating ductal carcinoma, 3/8 axillary lymph nodes were positive for metastatic carcinoma, getting yearly mammogram and every other year MRI followed by oncologist Dr Benay Spice  . CHF (congestive heart failure) ([place])   . Chronic combined systolic and diastolic heart failure ([place]) 02/26/2016  . Complication of anesthesia    WAKE UP WITH HEADACHE  . DCM (dilated cardiomyopathy) ([place])    EF normalized at 60-65% on echo 07/2016  . Dysuria 2011  . Family history of adverse reaction to anesthesia    "family wakes up with personality changes, but are very short term"  . Family history of breast cancer   . Fatty liver CT 1/12   elevated LFT  . H/O actinic keratosis   . H/O allergic rhinitis   . H/O fibromyalgia   . H/O osteopenia 2009  . H/O peripheral neuropathy   . H/O varicose veins   . History of breast cancer   . History of IBS   . Hx of colonic polyps   . Hx of migraines   . Hx of ovarian cyst   . Hyperlipidemia   . Hypertension   . Incomplete RBBB   . 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 OSA (obstructive sleep apnea) 12/31/2015   Very mild with AHI 6/hr but elevated RDI at 32/hr.  . Pelvic pain in female   . PMB (postmenopausal bleeding) 2011  . PONV (postoperative nausea and vomiting)   . Pregnancy induced hypertension   . Right-sided lacunar infarction ([place])    MRI 1.2012  . Tubular adenoma of colon    Family History  Problem Relation Age of Onset  . Heart disease Mother        MI  . Stroke Mother   . Diabetes Mother   . Hypertension Mother   . Stroke Father   . Prostate  cancer Father 20       deceased 76  . Colon cancer Sister 37       currently 59  . Breast cancer Paternal Aunt 71       deceased 65s  . Breast cancer Cousin        daughter of pat aunt w/ breast ca  . Breast cancer Cousin        daughter of pat aunt w/ breast ca   Past Surgical History:  Procedure Laterality Date  . gastroc muscle repair  2007  . LAPAROSCOPIC CHOLECYSTECTOMY    . MASTECTOMY  06/1995   Left - for infiltrating ductal carcinoma  . 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 ROBOTIC ASSISTED TOTAL HYSTERECTOMY WITH BILATERAL SALPINGO OOPHERECTOMY Bilateral 10/09/2015   Procedure: ROBOTIC ASSISTED BILATERAL SALPINGO OOPHORECTOMY ;  Surgeon: Everitt Amber, MD;  Location: [facility];  Service: Gynecology;  Laterality: Bilateral;  . tram surgery  1996  . TUBAL LIGATION     Social History   Socioeconomic History  . Marital status: Widowed    Spouse name: Not on file  . Number of children: Not on file  . Years of education: Not on file  . Highest education level: Not on file  Occupational History  . Not on file  Social Needs  . Financial resource strain: Not on file  . Food insecurity:    Worry: Not on file    Inability: Not on file  . Transportation needs:    Medical: Not on file    Non-medical: Not on file  Tobacco Use  . Smoking status: Never Smoker  . Smokeless tobacco: Never Used  Substance and Sexual Activity  . Alcohol use: No  . Drug use: No  . Sexual activity: Not on file  Lifestyle  . Physical activity:    Days per week: Not on file    Minutes per session: Not on file  . Stress: Not on file  Relationships  . Social connections:    Talks on phone: Not on file    Gets together: Not on file    Attends religious service: Not on file    Active member of club or organization: Not on file    Attends meetings of clubs or organizations: Not on file    Relationship status: Not on file  . Intimate partner violence:    Fear of current or ex  partner: Not on file    Emotionally abused: Not on file    Physically abused: Not on file    Forced sexual activity: Not on file  Other Topics Concern  . 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  Cardiovascular: negative for chest pain, dyspnea on exertion, edema, orthopnea, palpitations, paroxysmal nocturnal dyspnea or shortness of breath Dermatological: negative for rash Respiratory: negative for cough or wheezing Urologic: negative for hematuria Abdominal: negative for nausea, vomiting, diarrhea, bright red blood per rectum, melena, or hematemesis Neurologic: negative for visual changes, syncope, or dizziness All other systems reviewed and are otherwise negative except as noted above.   Physical Exam:  Blood pressure 130/78, pulse 69, height 5\' 4"  (1.626 m), weight 227 lb 12 oz (103.3 kg).  General appearance: alert, cooperative and no distress Neck: no carotid bruit and no JVD Lungs: clear to auscultation bilaterally Heart: regular rate and rhythm, S1, S2 normal, no murmur, click, rub or gallop Extremities: extremities normal, atraumatic, no cyanosis or edema Pulses: 2+ and symmetric Skin: Skin color, texture, turgor normal. No rashes or lesions Neurologic: Grossly normal  EKG NSR, 69 bpm. No ischemic changes -- personally reviewed   ASSESSMENT AND PLAN:   1. 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 EF in  2016 showed EF of 30-35%, Coronary CTA negative w/o CAD. EF improved on recent echo 12/2017 up to 50-55%. Continue medical management, w/ Entrestro and Coreg.   2. Preoperative Risk Assessment: She is being evaluated prior to left  vasectomy for treatment of breast cancer.  Denies any anginal symptomatology.  She has no known coronary disease, coronary CTA with morphology in 2016 showed calcium score of 0 and no CAD.  Recent echocardiogram February 2016 showed normal left ventricular EF 50 to 55% and no valvular abnormalities.  Her EKG shows normal sinus rhythm and is unchanged in comparison to previous EKGs.  She denies anginal symptomatology.  She is able to complete greater than 4 METS of physical activity, ambulate a flight of stairs, without exertional chest pain or dyspnea.  Her physical exam is benign, with regular rate and rhythm, no murmurs and no carotid bruits.  There is no edema.  Based on the revised cardiac risk index, her only risk factor is history of remote CVA.  Based on this, her risk of having a major cardiac event is less than 1% at 0.9%.  Based on evaluation and risk assessment, she can be cleared to undergo noncardiac surgery without need for additional cardiac testing.  She is of low risk.  Recommend continuation of cardiac medications during the perioperative period.  3.  Hypertension: Well-controlled on current regimen.  PCP recently checked basic laboratory work.  Renal function and electrolytes were within normal limits.  4.  Breast cancer: Awaiting left mastectomy  Follow-Up w/ Dr. Radford Pax in 1 year   Mysha Peeler Ladoris Gene, MHS [facility] 03/13/2018 9:02 AM

## 2018-03-13 NOTE — Patient Instructions (Signed)
Medication Instructions:   Your physician recommends that you continue on your current medications as directed. Please refer to the Current Medication list given to you today.   If you need a refill on your cardiac medications before your next appointment, please call your pharmacy.  Labwork: NONE ORDERED  TODAY    Testing/Procedures: NONE ORDERED  TODAY    Follow-Up: Your physician wants you to follow-up in: ONE YEAR WITH  [facility]  You will receive a reminder letter in the mail two months in advance. If you don't receive a letter, please call our office to schedule the follow-up appointment.     Any Other Special Instructions Will Be Listed Below (If Applicable).                                                                                                                                                   

## 2018-03-13 NOTE — Progress Notes (Signed)
[facility]etics [facility]       Genetic Test Results    Patient Name: Meagan Ryan Patient DOB: [id number] Patient Age: 62 y.o. Encounter Date: 03/13/2018  Referring Provider: Truitt Merle, MD  Primary Care Provider: Hulan Fess, MD   Meagan Ryan was called today to discuss genetic test results. Please see the Genetics note from her visit on 03/06/2018 for a detailed discussion of her personal and family history.  Genetic Testing: At the time of Meagan Ryan's visit, she decided to pursue genetic testing of multiple genes associated with hereditary susceptibility to cancer. Testing included sequencing and deletion/duplication analysis. Testing did not reveal a pathogenic mutation in any of the genes analyzed.  A copy of the genetic test report will be scanned into Epic under the Media tab.  The genes analyzed were the 47 genes on [facility]'s Common Cancers panel (APC, ATM, AXIN2, BARD1, BMPR1A, BRCA1, BRCA2, BRIP1, CDH1, CDK4, CDKN2A, CHEK2, CTNNA1, DICER1, EPCAM, GREM1, HOXB13, KIT, MEN1, MLH1, MSH2, MSH3, MSH6, MUTYH, NBN, NF1, NTHL1, PALB2, PDGFRA, PMS2, POLD1, POLE, PTEN, RAD50, RAD51C, RAD51D, SDHA, SDHB, SDHC, SDHD, SMAD4, SMARCA4, STK11, TP53, TSC1, TSC2, VHL).  Since the current test is not perfect, it is possible that there may be a gene mutation that current testing cannot detect, but that chance is small. It is possible that a different genetic factor, which has not yet been discovered or is not on this panel, is responsible for the cancer diagnoses in the family. Again, the likelihood of this is low. No additional testing is recommended at this time for Meagan Ryan.  A Variant of Uncertain Significance was detected: POLE c.2171C>T (p.Ala724Val). This is still considered a normal result. While at this time, it is unknown if this finding is associated with increased cancer risk, the majority of these variants get reclassified to be inconsequential. Medical management  should not be based on this finding. With time, we suspect the lab will determine the significance, if any. If we do learn more about it, we will try to contact Meagan Ryan to discuss it further. It is important to stay in touch with [place] periodically and keep the address and phone number up to date.  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 Most cancers happen by chance and this test, along with details of her family history, suggests that her cancers fall into this category. She is recommended to follow the cancer screening guidelines provided by her physician.   Family Members: Given the young age of initial diagnosis of breast cancer in Meagan Ryan (age 89), women are recommended to speak with their own providers about initiating breast imaging beginning at age 47, which is 74 years earlier than the youngest breast cancer in the family.  Any relative who had cancer at a young age or had a particularly rare cancer may also wish to pursue genetic testing. Genetic counselors can be located in other cities, by visiting the website of the [employer] (ArtistMovie.se) and Field seismologist for a Dietitian by zip code.   Family members are not recommended to get tested for the above VUS outside of a research protocol as this finding has no implications for their medical management.  Lastly, cancer genetics is a rapidly advancing field and it is possible  that new genetic tests will be appropriate for Meagan Ryan in the future. Meagan Ryan is encouraged to remain in contact with Genetics on an annual basis so we can update her personal and family histories, and let her know of advances in cancer genetics that may benefit the family. Meagan Ryan questions were answered to her satisfaction today, and she knows she is welcome to call  anytime with additional questions.     Steele Berg, MS, [place] Certified Genetic Counselor phone: [phone]

## 2018-03-23 NOTE — Pre-Procedure Instructions (Signed)
   Meagan Ryan  03/23/2018     [facility] - [address], [place] [address] Phone: [phone] Fax: [phone]  [facility] [place], [place] - [address] [place] [address] [place] [address] [place] [address] [place] [address] [place] [address] Phone: [phone] Fax: [phone]   Your procedure is scheduled on Friday, Mar 31, 2018  Report to [facility] Admitting at 5:30 A.M.  Call this number if you have problems the morning of surgery:  [phone]   Remember: Follow your surgeons instructions regarding Aspirin   Do not eat food or drink liquids after midnight Thursday, Mar 30, 2018   However, drink the Ensure Pre-surgery drink by 4:30 A.M on morning of surgery (3 hours prior to surgery).  Take these medicines the morning of surgery with A SIP OF WATER: ALPRAZolam (XANAX), carvedilol (COREG), cetirizine (ZYRTEC), gabapentin (NEURONTIN), omeprazole (PRILOSEC), ivabradine (CORLANOR), raloxifene (EVISTA), fluticasone (FLONASE)  nasal spray,  if needed: azelastine (ASTELIN)  nasal spray for allergies  Stop taking vitamins, fish oil and herbal medications. Do not take any NSAIDs ie: Ibuprofen, Advil, Naproxen (Aleve), Motrin, BC and Goody Powder; stop now.   Do not wear jewelry, make-up or nail polish.  Do not wear lotions, powders, or perfumes, or deodorant.  Do not shave 48 hours prior to surgery.    Do not bring valuables to the hospital.  [facility] is not responsible for any belongings or valuables.  Contacts, dentures or bridgework may not be worn into surgery.  Leave your suitcase in the car.  After surgery it may be brought to your room.  Patients discharged the day of surgery will not be allowed to drive home.   Special instructions: Shower the night before surgery and the morning of surgery with CHG. Please read over the following fact sheets that you were given. Pain  Booklet, Coughing and Deep Breathing and Surgical Site Infection Prevention

## 2018-03-24 ENCOUNTER — Other Ambulatory Visit: Payer: Self-pay

## 2018-03-24 ENCOUNTER — Encounter (HOSPITAL_COMMUNITY): Payer: Self-pay

## 2018-03-24 ENCOUNTER — Encounter (HOSPITAL_COMMUNITY)
Admission: RE | Admit: 2018-03-24 | Discharge: 2018-03-24 | Disposition: A | Discharge status: Home / Self Care | Source: Ambulatory Visit | Attending: General Surgery | Admitting: General Surgery

## 2018-03-24 DIAGNOSIS — Z01812 Encounter for preprocedural laboratory examination: Secondary | ICD-10-CM | POA: Insufficient documentation

## 2018-03-24 DIAGNOSIS — C50911 Malignant neoplasm of unspecified site of right female breast: Secondary | ICD-10-CM | POA: Insufficient documentation

## 2018-03-24 LAB — CBC
HEMATOCRIT: 42.6 % (ref 36.0–46.0)
HEMOGLOBIN: 13.7 g/dL (ref 12.0–15.0)
MCH: 28.9 pg (ref 26.0–34.0)
MCHC: 32.2 g/dL (ref 30.0–36.0)
MCV: 89.9 fL (ref 78.0–100.0)
Platelets: 248 10*3/uL (ref 150–400)
RBC: 4.74 MIL/uL (ref 3.87–5.11)
RDW: 14 % (ref 11.5–15.5)
WBC: 5.5 10*3/uL (ref 4.0–10.5)

## 2018-03-24 LAB — BASIC METABOLIC PANEL
ANION GAP: 8 (ref 5–15)
BUN: 11 mg/dL (ref 6–20)
CO2: 27 mmol/L (ref 22–32)
Calcium: 9.2 mg/dL (ref 8.9–10.3)
Chloride: 104 mmol/L (ref 101–111)
Creatinine, Ser: 1.15 mg/dL — ABNORMAL HIGH (ref 0.44–1.00)
GFR calc Af Amer: 57 mL/min — ABNORMAL LOW (ref >60)
GFR, EST NON AFRICAN AMERICAN: 50 mL/min — AB (ref >60)
GLUCOSE: 93 mg/dL (ref 65–99)
POTASSIUM: 4.2 mmol/L (ref 3.5–5.1)
Sodium: 139 mmol/L (ref 135–145)

## 2018-03-24 MED ORDER — CHLORHEXIDINE GLUCONATE CLOTH 2 % EX PADS: 6.0000 | MEDICATED_PAD | Freq: Once | CUTANEOUS | Status: DC

## 2018-03-24 NOTE — Progress Notes (Signed)
PCP: Hulan Fess, MD  Cardiologist: Fransico Him, MD  EKG: 03/13/18 in EPIC  Stress test: 08/26/15 in EPIC  ECHO: 12/14/17 in EPIC  Cardiac Cath: pt denies ever  Chest x-ray: 12/14/17 in [facility]

## 2018-03-27 ENCOUNTER — Other Ambulatory Visit: Payer: Self-pay | Admitting: Cardiology

## 2018-03-27 NOTE — Progress Notes (Signed)
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 (all labs ordered are listed, but only abnormal results are displayed)  Labs Reviewed  BASIC METABOLIC PANEL - Abnormal; Notable for the following components:      Result Value   Creatinine, Ser 1.15 (*)    GFR calc non Af Amer 50 (*)    GFR calc Af Amer 57 (*)    All other components within normal limits  CBC     IMAGES:  CXR 12/14/17:  No acute cardiopulmonary process seen.   EKG 03/13/18: NSr. LVH with QRS widening. Possible lateral infarct, age undetermined.    CV:  Echo 12/14/17:  - Left ventricle: The cavity size was normal. Wall thickness was normal. Systolic function was normal. The estimated ejection fraction was in the range of 50% to 55%. Wall motion was normal; there were no regional wall motion abnormalities. Features are consistent with a pseudonormal left ventricular filling pattern, with concomitant abnormal relaxation and increased filling pressure (grade 2 diastolic dysfunction). - Right atrium: The atrium was mildly  dilated.  CTA coronary morphology 09/04/15:  1. Coronary calcium score of 0. This was 0 percentile for age and sex matched control. 2. Normal coronary origin.  Right dominance. 3.  No evidence of CAD.  Nuclear stress test 08/26/15:   Nuclear stress EF: 46%. Paradoxical septal motion.  There was no ST segment deviation noted during stress.  Defect 1: There is a small defect of mild severity present in the basal inferolateral and mid inferolateral location.  Findings consistent with ischemia.  This is an intermediate risk study.  The left ventricular ejection fraction is mildly decreased (45-54%). - There is a small area, mild severity reversible defect in the basal and mid inferolateral walls consistent with mild ischemia in the LCX territory (SDS = 3).    Past Medical History:  Diagnosis Date  . Allergic rhinitis   . Asthma   . Breast cancer ([place]) 1996   left breast-stage II infiltrating ductal carcinoma, 3/8 axillary lymph nodes were positive for metastatic carcinoma, getting yearly mammogram and every other year MRI followed by oncologist Dr Benay Spice  . CHF (congestive heart failure) ([place])   . Chronic combined systolic and diastolic heart failure ([place]) 02/26/2016  . Complication of anesthesia    WAKE UP WITH HEADACHE, difficulty waking up  . DCM (dilated cardiomyopathy) ([place])    EF normalized at 60-65% on echo 07/2016  . Dysuria  2011  . Family history of adverse reaction to anesthesia    "family wakes up with personality changes, but are very short term"  . Family history of breast cancer   . Fatty liver CT 1/12   elevated LFT  . Genetic testing 03/13/2018   Common cancers panel (47 genes) @ [facility] - No pathogenic mutations detected  . H/O actinic keratosis   . H/O allergic rhinitis   . H/O fibromyalgia   . H/O osteopenia 2009  . H/O peripheral neuropathy   . H/O varicose veins   . History of breast cancer   . History of IBS   . Hx of colonic polyps   . Hx of  migraines   . Hx of ovarian cyst   . Hyperlipidemia   . Hypertension   . Incomplete RBBB   . 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 OSA (obstructive sleep apnea) 12/31/2015   Very mild with AHI 6/hr but elevated RDI at 32/hr.  . Pelvic pain in female   . PMB (postmenopausal bleeding) 2011  . PONV (postoperative nausea and vomiting)   . Pregnancy induced hypertension   . Right-sided lacunar infarction ([place])    MRI 1.2012  . Tubular adenoma of colon     Past Surgical History:  Procedure Laterality Date  . gastroc muscle repair  2007  . LAPAROSCOPIC CHOLECYSTECTOMY    . MASTECTOMY  06/1995   Left - for infiltrating ductal carcinoma  . 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 ROBOTIC ASSISTED TOTAL HYSTERECTOMY WITH BILATERAL SALPINGO OOPHERECTOMY Bilateral 10/09/2015   Procedure: ROBOTIC ASSISTED BILATERAL SALPINGO OOPHORECTOMY ;  Surgeon: Everitt Amber, MD;  Location: [facility] ORS;  Service: Gynecology;  Laterality: Bilateral;  . tram surgery  1996  . TUBAL LIGATION      MEDICATIONS: . ALPRAZolam (XANAX) 0.5 MG tablet  . azelastine (ASTELIN) 0.1 % nasal spray  . carvedilol (COREG) 3.125 MG tablet  . cetirizine (ZYRTEC) 10 MG tablet  . DULoxetine (CYMBALTA) 30 MG capsule  . fluticasone (FLONASE) 50 MCG/ACT nasal spray  . gabapentin (NEURONTIN) 300 MG capsule  . ibuprofen (ADVIL,MOTRIN) 200 MG tablet  . ivabradine (CORLANOR) 5 MG TABS tablet  . niacin 500 MG tablet  . nortriptyline (PAMELOR) 25 MG capsule  . Omega-3 Fatty Acids (FISH OIL) 1000 MG CAPS  . omeprazole (PRILOSEC) 20 MG capsule  . raloxifene (EVISTA) 60 MG tablet  . sacubitril-valsartan (ENTRESTO) 24-26 MG   No current facility-administered medications for this encounter.     If no changes, I anticipate pt can proceed with surgery as scheduled.   Willeen Cass, FNP-BC [facility]/Anesthesiology Phone: [phone] 03/27/2018 10:01 AM

## 2018-03-29 ENCOUNTER — Institutional Professional Consult (permissible substitution): Admitting: Neurology

## 2018-03-30 MED ORDER — VANCOMYCIN HCL 10 G IV SOLR
1500.0000 mg | INTRAVENOUS | Status: AC
  Administered 2018-03-31: 1500 mg via INTRAVENOUS
  Filled 2018-03-30: qty 1500

## 2018-03-30 NOTE — H&P (Signed)
History of Present Illness Meagan Kitchen T. Syndi Pua MD; 03/01/2018 5:25 PM) The patient is a 62 year old female who presents with breast cancer. She is a postmenopausal female referred by Dr. Malka So for evaluation of recently diagnosed carcinoma of the right breast. the patient has a significant past medical history of left breast cancer treated with modified mastectomy, TRAM flap reconstruction and adjuvant chemotherapy in 1996. She recently presented for a screening mamogram revealing a possible right breast mass. Subsequent imaging included diagnostic mamogram showing a 5 mm irregular retro-areolar mass middle depth and ultrasound showing a 7 x 5 mm avascular mass 12:00 position and negative axilla. An ultrasound guided breast biopsy was performed on 02/21/2018 with pathology revealing invasive ductal carcinoma of the breast. She is seen now in breast multidisciplinary for initial treatment planning. She has experienced no breast symptoms, specifically mass or nipple discharge or skin changes.   Findings at that time were the following: Tumor size: 0.7 cm Tumor grade: grade 1, Ki-67 5% Estrogen Receptor: 100% positive Progesterone Receptor: 100% positive Her-2 neu: negative Lymph node status: negative    Past Surgical History Meagan Pummel, RN; 03/01/2018 7:41 AM) Breast Biopsy  Right. Hysterectomy (not due to cancer) - Partial  Mastectomy  Left. Nephrectomy  Right.  Diagnostic Studies History Meagan Pummel, RN; 03/01/2018 7:41 AM) Colonoscopy  5-10 years ago Mammogram  within last year Pap Smear  1-5 years ago  Medication History Meagan Pummel, RN; 03/01/2018 7:41 AM) Medications Reconciled  Social History Meagan Pummel, RN; 03/01/2018 7:41 AM) Caffeine use  Carbonated beverages, Coffee. No alcohol use  No drug use  Tobacco use  Never smoker.  Family History Meagan Pummel, RN; 03/01/2018 7:41 AM) Anesthetic complications  Sister. Diabetes Mellitus   Mother, Sister. Heart Disease  Mother, Sister. Hypertension  Father, Sister. Kidney Disease  Father.  Pregnancy / Birth History Meagan Pummel, RN; 03/01/2018 7:41 AM) Age at menarche  74 years. Age of menopause  <43 Maternal age  52-30 Para  1 Regular periods   Other Problems Meagan Pummel, RN; 03/01/2018 7:41 AM) Breast Cancer  Cerebrovascular Accident  Depression  General anesthesia - complications  Lump In Breast  Other disease, cancer, significant illness  Sleep Apnea     Review of Systems Meagan Spillers Ledford RN; 03/01/2018 7:41 AM) General Present- Fatigue. Not Present- Appetite Loss, Chills, Fever, Night Sweats, Weight Gain and Weight Loss. Skin Not Present- Change in Wart/Mole, Dryness, Hives, Jaundice, New Lesions, Non-Healing Wounds, Rash and Ulcer. HEENT Present- Seasonal Allergies, Sinus Pain and Wears glasses/contact lenses. Not Present- Earache, Hearing Loss, Hoarseness, Nose Bleed, Oral Ulcers, Ringing in the Ears, Sore Throat, Visual Disturbances and Yellow Eyes. Respiratory Present- Snoring. Not Present- Bloody sputum, Chronic Cough, Difficulty Breathing and Wheezing. Breast Not Present- Breast Mass, Breast Pain, Nipple Discharge and Skin Changes. Cardiovascular Not Present- Chest Pain, Difficulty Breathing Lying Down, Leg Cramps, Palpitations, Rapid Heart Rate, Shortness of Breath and Swelling of Extremities. Gastrointestinal Not Present- Abdominal Pain, Bloating, Bloody Stool, Change in Bowel Habits, Chronic diarrhea, Constipation, Difficulty Swallowing, Excessive gas, Gets full quickly at meals, Hemorrhoids, Indigestion, Nausea, Rectal Pain and Vomiting. Female Genitourinary Not Present- Frequency, Nocturia, Painful Urination, Pelvic Pain and Urgency. Musculoskeletal Present- Muscle Weakness. Not Present- Back Pain, Joint Pain, Joint Stiffness, Muscle Pain and Swelling of Extremities. Neurological Present- Seizures and Weakness. Not Present- Decreased  Memory, Fainting, Headaches, Numbness, Tingling, Tremor and Trouble walking. Psychiatric Not Present- Anxiety, Bipolar, Change in Sleep Pattern, Depression, Fearful and Frequent crying. Endocrine Present- Hot flashes.  Not Present- Cold Intolerance, Excessive Hunger, Hair Changes, Heat Intolerance and New Diabetes.   Physical Exam Meagan Kitchen T. Tigran Haynie MD; 03/01/2018 5:28 PM) The physical exam findings are as follows: Note:General: Alert, pleasant, obese Caucasian female, in no distress Skin: Warm and dry without rash or infection. HEENT: No palpable masses or thyromegaly. Sclera nonicteric. Pupils equal round and reactive. Lymph nodes: No cervical, supraclavicular, nodes palpable. Breasts: No palpable masses in the right breast. No pkin changes orry adenopathy.ion or crusting. No palpable axillary adenopathy. Reconstructed left chest wall well-healed without skin changes or palpable masses. Lungs: Breath sounds clear and equal. No wheezing or increased work of breathing. Cardiovascular: Regular rate and rhythm without murmer. No JVD or edema. Peripheral pulses intact. Abdomen: Nondistended. Soft and nontender. No masses palpable. No organomegaly. No palpable hernias. Extremities: No edema or joint swelling or deformity. No chronic venous stasis changes. Neurologic: Alert and fully oriented. Gait normal. No focal weakness. Psychiatric: Normal mood and affect. Thought content appropriate with normal judgement and insight    Assessment & Plan Meagan Kitchen T. Antoni Stefan MD; 03/01/2018 5:32 PM) MALIGNANT NEOPLASM OF RIGHT BREAST, STAGE 1, ESTROGEN RECEPTOR POSITIVE (C50.911) Impression: 62 year old female with a new diagnosis of cancer of the right breast, upper outer quadrant. Clinical stage one a, ER positive, PR positive, HER-2 negative. I discussed with the patient and her daughter today initial surgical treatment options. We discussed options of breast conservation with lumpectomy or total  mastectomy and sentinal lymph node biopsy/dissection. Options for reconstruction were discussed. After discussion they have elected to proceed with right total mastectomy with axillary sentinel lymph node biopsy without reconstruction. she understands she would be a candidate for breast conservation and that mastectomy does not confer any additional survival advantage. She however does not want to undergo further screening or diagnoses and states she actually wished she had undergone a right mastectomy at the time of her diagnosis of left breast cancer. We discussed the indications and nature of the procedure, and expected recovery, in detail. Surgical risks including anesthetic complications, cardiorespiratory complications, bleeding, infection, wound healing complications, blood clots, lymphedema, local and distant recurrence and possible need for further surgery based on the final pathology was discussed and understood. Chemotherapy, hormonal therapy and radiation therapy have been discussed. They have been provided with literature regarding the treatment of breast cancer. All questions were answered. They understand and agree to proceed and we will go ahead with scheduling. Current Plans Schedule for Surgery  Right total mastectomy and axillary sentinel LN biopsy under general anesthesia with overnight observation

## 2018-03-31 ENCOUNTER — Observation Stay (HOSPITAL_COMMUNITY)
Admission: RE | Admit: 2018-03-31 | Discharge: 2018-04-01 | Disposition: A | Discharge status: Home / Self Care | Source: Ambulatory Visit | Attending: General Surgery | Admitting: General Surgery

## 2018-03-31 ENCOUNTER — Encounter (HOSPITAL_COMMUNITY): Payer: Self-pay | Admitting: Certified Registered"

## 2018-03-31 ENCOUNTER — Encounter (HOSPITAL_COMMUNITY)
Admission: RE | Disposition: A | Discharge status: Home / Self Care | Payer: Self-pay | Source: Ambulatory Visit | Attending: General Surgery

## 2018-03-31 ENCOUNTER — Other Ambulatory Visit: Payer: Self-pay

## 2018-03-31 ENCOUNTER — Other Ambulatory Visit: Payer: Self-pay | Admitting: Cardiology

## 2018-03-31 ENCOUNTER — Encounter (HOSPITAL_COMMUNITY)
Admission: RE | Admit: 2018-03-31 | Discharge: 2018-03-31 | Disposition: A | Discharge status: Home / Self Care | Source: Ambulatory Visit | Attending: General Surgery | Admitting: General Surgery

## 2018-03-31 ENCOUNTER — Ambulatory Visit (HOSPITAL_COMMUNITY): Admitting: Certified Registered"

## 2018-03-31 ENCOUNTER — Ambulatory Visit (HOSPITAL_COMMUNITY): Admitting: Emergency Medicine

## 2018-03-31 DIAGNOSIS — Z853 Personal history of malignant neoplasm of breast: Secondary | ICD-10-CM | POA: Insufficient documentation

## 2018-03-31 DIAGNOSIS — Z170 Estrogen receptor positive status [ER+]: Secondary | ICD-10-CM | POA: Insufficient documentation

## 2018-03-31 DIAGNOSIS — C50911 Malignant neoplasm of unspecified site of right female breast: Secondary | ICD-10-CM

## 2018-03-31 DIAGNOSIS — C50411 Malignant neoplasm of upper-outer quadrant of right female breast: Secondary | ICD-10-CM | POA: Diagnosis not present

## 2018-03-31 DIAGNOSIS — Z882 Allergy status to sulfonamides status: Secondary | ICD-10-CM | POA: Insufficient documentation

## 2018-03-31 DIAGNOSIS — Z7951 Long term (current) use of inhaled steroids: Secondary | ICD-10-CM | POA: Insufficient documentation

## 2018-03-31 DIAGNOSIS — Z79899 Other long term (current) drug therapy: Secondary | ICD-10-CM | POA: Diagnosis not present

## 2018-03-31 DIAGNOSIS — Z79810 Long term (current) use of selective estrogen receptor modulators (SERMs): Secondary | ICD-10-CM | POA: Insufficient documentation

## 2018-03-31 DIAGNOSIS — Z880 Allergy status to penicillin: Secondary | ICD-10-CM | POA: Insufficient documentation

## 2018-03-31 DIAGNOSIS — Z9012 Acquired absence of left breast and nipple: Secondary | ICD-10-CM | POA: Insufficient documentation

## 2018-03-31 DIAGNOSIS — I10 Essential (primary) hypertension: Secondary | ICD-10-CM | POA: Diagnosis not present

## 2018-03-31 DIAGNOSIS — M797 Fibromyalgia: Secondary | ICD-10-CM | POA: Insufficient documentation

## 2018-03-31 DIAGNOSIS — Z888 Allergy status to other drugs, medicaments and biological substances status: Secondary | ICD-10-CM | POA: Insufficient documentation

## 2018-03-31 DIAGNOSIS — Z885 Allergy status to narcotic agent status: Secondary | ICD-10-CM | POA: Insufficient documentation

## 2018-03-31 DIAGNOSIS — Z881 Allergy status to other antibiotic agents status: Secondary | ICD-10-CM | POA: Insufficient documentation

## 2018-03-31 DIAGNOSIS — G4730 Sleep apnea, unspecified: Secondary | ICD-10-CM | POA: Insufficient documentation

## 2018-03-31 HISTORY — PX: MASTECTOMY W/ SENTINEL NODE BIOPSY: SHX2001

## 2018-03-31 SURGERY — MASTECTOMY WITH SENTINEL LYMPH NODE BIOPSY
Anesthesia: General | Site: Breast | Laterality: Right

## 2018-03-31 MED ORDER — SCOPOLAMINE 1 MG/3DAYS TD PT72
MEDICATED_PATCH | TRANSDERMAL | Status: DC | PRN
  Administered 2018-03-31: 1 via TRANSDERMAL

## 2018-03-31 MED ORDER — SODIUM CHLORIDE 0.9 % IJ SOLN
INTRAMUSCULAR | Status: AC
  Filled 2018-03-31: qty 10

## 2018-03-31 MED ORDER — ENOXAPARIN SODIUM 40 MG/0.4ML ~~LOC~~ SOLN
40.0000 mg | SUBCUTANEOUS | Status: DC
  Administered 2018-04-01: 40 mg via SUBCUTANEOUS
  Filled 2018-03-31: qty 0.4

## 2018-03-31 MED ORDER — PROPOFOL 10 MG/ML IV BOLUS
INTRAVENOUS | Status: DC | PRN
  Administered 2018-03-31: 200 mg via INTRAVENOUS

## 2018-03-31 MED ORDER — ACETAMINOPHEN 500 MG PO TABS
ORAL_TABLET | ORAL | Status: AC
  Filled 2018-03-31: qty 2

## 2018-03-31 MED ORDER — ONDANSETRON HCL 4 MG/2ML IJ SOLN
INTRAMUSCULAR | Status: AC
  Filled 2018-03-31: qty 2

## 2018-03-31 MED ORDER — PHENYLEPHRINE 40 MCG/ML (10ML) SYRINGE FOR IV PUSH (FOR BLOOD PRESSURE SUPPORT)
PREFILLED_SYRINGE | INTRAVENOUS | Status: DC | PRN
  Administered 2018-03-31 (×2): 120 ug via INTRAVENOUS

## 2018-03-31 MED ORDER — DULOXETINE HCL 30 MG PO CPEP
30.0000 mg | ORAL_CAPSULE | Freq: Every day | ORAL | Status: DC
  Administered 2018-03-31: 30 mg via ORAL
  Filled 2018-03-31: qty 1

## 2018-03-31 MED ORDER — METHYLENE BLUE 0.5 % INJ SOLN
INTRAVENOUS | Status: AC
  Filled 2018-03-31: qty 10

## 2018-03-31 MED ORDER — ONDANSETRON HCL 4 MG/2ML IJ SOLN: 4.0000 mg | Freq: Four times a day (QID) | INTRAMUSCULAR | Status: DC | PRN

## 2018-03-31 MED ORDER — 0.9 % SODIUM CHLORIDE (POUR BTL) OPTIME
TOPICAL | Status: DC | PRN
  Administered 2018-03-31: 1000 mL

## 2018-03-31 MED ORDER — MORPHINE SULFATE (PF) 2 MG/ML IV SOLN: 2.0000 mg | INTRAVENOUS | Status: DC | PRN

## 2018-03-31 MED ORDER — FENTANYL CITRATE (PF) 250 MCG/5ML IJ SOLN
INTRAMUSCULAR | Status: DC | PRN
  Administered 2018-03-31: 25 ug via INTRAVENOUS
  Administered 2018-03-31: 50 ug via INTRAVENOUS

## 2018-03-31 MED ORDER — TECHNETIUM TC 99M SULFUR COLLOID FILTERED
1.0000 | Freq: Once | INTRAVENOUS | Status: AC | PRN
  Administered 2018-03-31: 1 via INTRADERMAL

## 2018-03-31 MED ORDER — MIDAZOLAM HCL 2 MG/2ML IJ SOLN
INTRAMUSCULAR | Status: DC | PRN
  Administered 2018-03-31: 1 mg via INTRAVENOUS

## 2018-03-31 MED ORDER — ONDANSETRON HCL 4 MG/2ML IJ SOLN
INTRAMUSCULAR | Status: DC | PRN
  Administered 2018-03-31: 4 mg via INTRAVENOUS

## 2018-03-31 MED ORDER — PANTOPRAZOLE SODIUM 40 MG PO TBEC
40.0000 mg | DELAYED_RELEASE_TABLET | Freq: Every day | ORAL | Status: DC
  Administered 2018-04-01: 40 mg via ORAL
  Filled 2018-03-31: qty 1

## 2018-03-31 MED ORDER — DEXAMETHASONE SODIUM PHOSPHATE 10 MG/ML IJ SOLN
INTRAMUSCULAR | Status: AC
  Filled 2018-03-31: qty 1

## 2018-03-31 MED ORDER — MEPERIDINE HCL 50 MG/ML IJ SOLN: 6.2500 mg | INTRAMUSCULAR | Status: DC | PRN

## 2018-03-31 MED ORDER — METOCLOPRAMIDE HCL 5 MG/ML IJ SOLN: 10.0000 mg | Freq: Once | INTRAMUSCULAR | Status: DC | PRN

## 2018-03-31 MED ORDER — SACUBITRIL-VALSARTAN 24-26 MG PO TABS
1.0000 | ORAL_TABLET | Freq: Two times a day (BID) | ORAL | Status: DC
  Administered 2018-03-31 – 2018-04-01 (×2): 1 via ORAL
  Filled 2018-03-31 (×2): qty 1

## 2018-03-31 MED ORDER — IVABRADINE HCL 5 MG PO TABS
5.0000 mg | ORAL_TABLET | Freq: Two times a day (BID) | ORAL | Status: DC
  Administered 2018-03-31 – 2018-04-01 (×2): 5 mg via ORAL
  Filled 2018-03-31 (×2): qty 1

## 2018-03-31 MED ORDER — ROPIVACAINE HCL 5 MG/ML IJ SOLN
INTRAMUSCULAR | Status: DC | PRN
  Administered 2018-03-31: 30 mL via PERINEURAL

## 2018-03-31 MED ORDER — CARVEDILOL 3.125 MG PO TABS
3.1250 mg | ORAL_TABLET | Freq: Two times a day (BID) | ORAL | Status: DC
  Administered 2018-03-31 – 2018-04-01 (×2): 3.125 mg via ORAL
  Filled 2018-03-31 (×2): qty 1

## 2018-03-31 MED ORDER — LACTATED RINGERS IV SOLN
INTRAVENOUS | Status: DC | PRN
  Administered 2018-03-31: 07:00:00 via INTRAVENOUS

## 2018-03-31 MED ORDER — LIDOCAINE 2% (20 MG/ML) 5 ML SYRINGE
INTRAMUSCULAR | Status: AC
  Filled 2018-03-31: qty 5

## 2018-03-31 MED ORDER — MIDAZOLAM HCL 2 MG/2ML IJ SOLN
INTRAMUSCULAR | Status: AC
  Filled 2018-03-31: qty 2

## 2018-03-31 MED ORDER — EPHEDRINE SULFATE 50 MG/ML IJ SOLN
INTRAMUSCULAR | Status: AC
  Filled 2018-03-31: qty 1

## 2018-03-31 MED ORDER — GABAPENTIN 300 MG PO CAPS
ORAL_CAPSULE | ORAL | Status: AC
  Filled 2018-03-31: qty 1

## 2018-03-31 MED ORDER — IBUPROFEN 600 MG PO TABS: 600.0000 mg | ORAL_TABLET | Freq: Every day | ORAL | Status: DC | PRN

## 2018-03-31 MED ORDER — EPHEDRINE SULFATE-NACL 50-0.9 MG/10ML-% IV SOSY
PREFILLED_SYRINGE | INTRAVENOUS | Status: DC | PRN
  Administered 2018-03-31: 5 mg via INTRAVENOUS
  Administered 2018-03-31 (×3): 10 mg via INTRAVENOUS

## 2018-03-31 MED ORDER — PROPOFOL 10 MG/ML IV BOLUS
INTRAVENOUS | Status: AC
Start: 2018-03-31 — End: ?
  Filled 2018-03-31: qty 40

## 2018-03-31 MED ORDER — FENTANYL CITRATE (PF) 100 MCG/2ML IJ SOLN: 25.0000 ug | INTRAMUSCULAR | Status: DC | PRN

## 2018-03-31 MED ORDER — POTASSIUM CHLORIDE IN NACL 20-0.9 MEQ/L-% IV SOLN
INTRAVENOUS | Status: DC
  Administered 2018-03-31: 11:00:00 via INTRAVENOUS
  Filled 2018-03-31: qty 1000

## 2018-03-31 MED ORDER — ACETAMINOPHEN 500 MG PO TABS
1000.0000 mg | ORAL_TABLET | ORAL | Status: AC
  Administered 2018-03-31: 1000 mg via ORAL

## 2018-03-31 MED ORDER — AZELASTINE HCL 0.1 % NA SOLN: 1.0000 | Freq: Two times a day (BID) | NASAL | Status: DC | PRN

## 2018-03-31 MED ORDER — ALPRAZOLAM 0.25 MG PO TABS
0.2500 mg | ORAL_TABLET | Freq: Two times a day (BID) | ORAL | Status: DC
  Administered 2018-03-31 – 2018-04-01 (×2): 0.25 mg via ORAL
  Filled 2018-03-31 (×2): qty 1

## 2018-03-31 MED ORDER — LACTATED RINGERS IV SOLN: INTRAVENOUS | Status: DC

## 2018-03-31 MED ORDER — DEXAMETHASONE SODIUM PHOSPHATE 10 MG/ML IJ SOLN
INTRAMUSCULAR | Status: DC | PRN
  Administered 2018-03-31: 10 mg via INTRAVENOUS

## 2018-03-31 MED ORDER — PHENYLEPHRINE HCL 10 MG/ML IJ SOLN
INTRAVENOUS | Status: DC | PRN
  Administered 2018-03-31: 50 ug/min via INTRAVENOUS

## 2018-03-31 MED ORDER — FENTANYL CITRATE (PF) 250 MCG/5ML IJ SOLN
INTRAMUSCULAR | Status: AC
  Filled 2018-03-31: qty 5

## 2018-03-31 MED ORDER — ONDANSETRON 4 MG PO TBDP: 4.0000 mg | ORAL_TABLET | Freq: Four times a day (QID) | ORAL | Status: DC | PRN

## 2018-03-31 MED ORDER — RALOXIFENE HCL 60 MG PO TABS
60.0000 mg | ORAL_TABLET | Freq: Every morning | ORAL | Status: DC
  Administered 2018-04-01: 60 mg via ORAL
  Filled 2018-03-31: qty 1

## 2018-03-31 MED ORDER — LIDOCAINE 2% (20 MG/ML) 5 ML SYRINGE
INTRAMUSCULAR | Status: DC | PRN
  Administered 2018-03-31: 100 mg via INTRAVENOUS

## 2018-03-31 MED ORDER — OXYCODONE HCL 5 MG PO TABS
5.0000 mg | ORAL_TABLET | ORAL | Status: DC | PRN
  Filled 2018-03-31: qty 2

## 2018-03-31 MED ORDER — SCOPOLAMINE 1 MG/3DAYS TD PT72
MEDICATED_PATCH | TRANSDERMAL | Status: AC
  Filled 2018-03-31: qty 1

## 2018-03-31 MED ORDER — NORTRIPTYLINE HCL 25 MG PO CAPS: 25.0000 mg | ORAL_CAPSULE | Freq: Every evening | ORAL | Status: DC | PRN

## 2018-03-31 MED ORDER — FLUTICASONE PROPIONATE 50 MCG/ACT NA SUSP
1.0000 | Freq: Every morning | NASAL | Status: DC
  Administered 2018-04-01: 1 via NASAL
  Filled 2018-03-31: qty 16

## 2018-03-31 MED ORDER — GABAPENTIN 300 MG PO CAPS: 300.0000 mg | ORAL_CAPSULE | ORAL | Status: DC

## 2018-03-31 MED ORDER — GABAPENTIN 300 MG PO CAPS
300.0000 mg | ORAL_CAPSULE | Freq: Two times a day (BID) | ORAL | Status: DC
  Administered 2018-03-31 – 2018-04-01 (×2): 300 mg via ORAL
  Filled 2018-03-31 (×2): qty 1

## 2018-03-31 SURGICAL SUPPLY — 58 items
ADH SKN CLS APL DERMABOND .7 (GAUZE/BANDAGES/DRESSINGS) ×1
BINDER BREAST LRG (GAUZE/BANDAGES/DRESSINGS) IMPLANT
BINDER BREAST XLRG (GAUZE/BANDAGES/DRESSINGS) ×1 IMPLANT
BIOPATCH RED 1 DISK 7.0 (GAUZE/BANDAGES/DRESSINGS) ×1 IMPLANT
CANISTER SUCT 3000ML PPV (MISCELLANEOUS) ×4 IMPLANT
CHLORAPREP W/TINT 26ML (MISCELLANEOUS) ×2 IMPLANT
CLIP VESOCCLUDE MED 6/CT (CLIP) ×2 IMPLANT
CONT SPEC 4OZ CLIKSEAL STRL BL (MISCELLANEOUS) ×3 IMPLANT
COVER PROBE W GEL 5X96 (DRAPES) ×2 IMPLANT
COVER SURGICAL LIGHT HANDLE (MISCELLANEOUS) ×2 IMPLANT
DERMABOND ADVANCED (GAUZE/BANDAGES/DRESSINGS) ×1
DERMABOND ADVANCED .7 DNX12 (GAUZE/BANDAGES/DRESSINGS) ×1 IMPLANT
DEVICE DISSECT PLASMABLAD 3.0S (MISCELLANEOUS) ×1 IMPLANT
DRAIN CHANNEL 19F RND (DRAIN) ×2 IMPLANT
DRAPE CHEST BREAST 15X10 FENES (DRAPES) ×2 IMPLANT
DRAPE SURG 17X23 STRL (DRAPES) ×8 IMPLANT
DRSG TEGADERM 4X4.75 (GAUZE/BANDAGES/DRESSINGS) ×1 IMPLANT
ELECT CAUTERY BLADE 6.4 (BLADE) ×2 IMPLANT
ELECT REM PT RETURN 9FT ADLT (ELECTROSURGICAL) ×4
ELECTRODE REM PT RTRN 9FT ADLT (ELECTROSURGICAL) ×2 IMPLANT
EVACUATOR SILICONE 100CC (DRAIN) ×2 IMPLANT
GLOVE BIOGEL PI IND STRL 7.5 (GLOVE) IMPLANT
GLOVE BIOGEL PI IND STRL 8 (GLOVE) ×1 IMPLANT
GLOVE BIOGEL PI INDICATOR 7.5 (GLOVE) ×3
GLOVE BIOGEL PI INDICATOR 8 (GLOVE) ×1
GLOVE ECLIPSE 7.5 STRL STRAW (GLOVE) ×1 IMPLANT
GLOVE SURG SS PI 7.0 STRL IVOR (GLOVE) ×1 IMPLANT
GLOVE SURG SS PI 7.5 STRL IVOR (GLOVE) ×5 IMPLANT
GLOVE SURG SS PI 8.0 STRL IVOR (GLOVE) ×1 IMPLANT
GOWN STRL REUS W/ TWL LRG LVL3 (GOWN DISPOSABLE) ×1 IMPLANT
GOWN STRL REUS W/ TWL XL LVL3 (GOWN DISPOSABLE) ×1 IMPLANT
GOWN STRL REUS W/TWL LRG LVL3 (GOWN DISPOSABLE) ×6
GOWN STRL REUS W/TWL XL LVL3 (GOWN DISPOSABLE) ×4
KIT BASIN OR (CUSTOM PROCEDURE TRAY) ×2 IMPLANT
KIT TURNOVER KIT B (KITS) ×2 IMPLANT
NDL 18GX1X1/2 (RX/OR ONLY) (NEEDLE) ×1 IMPLANT
NDL BLUNT 16X1.5 OR ONLY (NEEDLE) IMPLANT
NDL HYPO 25GX1X1/2 BEV (NEEDLE) ×1 IMPLANT
NEEDLE 18GX1X1/2 (RX/OR ONLY) (NEEDLE) IMPLANT
NEEDLE BLUNT 16X1.5 OR ONLY (NEEDLE) IMPLANT
NEEDLE HYPO 25GX1X1/2 BEV (NEEDLE) IMPLANT
NS IRRIG 1000ML POUR BTL (IV SOLUTION) ×2 IMPLANT
PACK GENERAL/GYN (CUSTOM PROCEDURE TRAY) ×2 IMPLANT
PAD ABD 8X10 STRL (GAUZE/BANDAGES/DRESSINGS) ×2 IMPLANT
PAD ARMBOARD 7.5X6 YLW CONV (MISCELLANEOUS) ×2 IMPLANT
PLASMABLADE 3.0S (MISCELLANEOUS) ×2
SPECIMEN JAR X LARGE (MISCELLANEOUS) ×2 IMPLANT
SPONGE LAP 18X18 X RAY DECT (DISPOSABLE) ×1 IMPLANT
STAPLER VISISTAT 35W (STAPLE) ×1 IMPLANT
SUT ETHILON 2 0 FS 18 (SUTURE) ×2 IMPLANT
SUT MNCRL AB 4-0 PS2 18 (SUTURE) ×2 IMPLANT
SUT VIC AB 3-0 54X BRD REEL (SUTURE) ×1 IMPLANT
SUT VIC AB 3-0 BRD 54 (SUTURE) ×2
SUT VIC AB 3-0 SH 18 (SUTURE) ×3 IMPLANT
SYR CONTROL 10ML LL (SYRINGE) ×2 IMPLANT
TOWEL OR 17X24 6PK STRL BLUE (TOWEL DISPOSABLE) ×2 IMPLANT
TOWEL OR 17X26 10 PK STRL BLUE (TOWEL DISPOSABLE) ×1 IMPLANT
TUBE CONNECTING 12X1/4 (SUCTIONS) ×2 IMPLANT

## 2018-03-31 NOTE — Anesthesia Preprocedure Evaluation (Signed)
Anesthesia Evaluation  Patient identified by MRN, date of birth, ID band Patient awake    Reviewed: Allergy & Precautions, NPO status , Patient's Chart, lab work & pertinent test results  History of Anesthesia Complications (+) PONV  Airway Mallampati: II  TM Distance: >3 FB Neck ROM: Full    Dental no notable dental hx.    Pulmonary sleep apnea and Continuous Positive Airway Pressure Ventilation ,    Pulmonary exam normal breath sounds clear to auscultation       Cardiovascular hypertension, Pt. on medications Normal cardiovascular exam Rhythm:Regular Rate:Normal     Neuro/Psych negative neurological ROS  negative psych ROS   GI/Hepatic negative GI ROS, Neg liver ROS,   Endo/Other  negative endocrine ROS  Renal/GU negative Renal ROS  negative genitourinary   Musculoskeletal  (+) Fibromyalgia -  Abdominal   Peds negative pediatric ROS (+)  Hematology negative hematology ROS (+)   Anesthesia Other Findings   Reproductive/Obstetrics negative OB ROS                             Anesthesia Physical Anesthesia Plan  ASA: II  Anesthesia Plan: General   Post-op Pain Management:  Regional for Post-op pain   Induction: Intravenous  PONV Risk Score and Plan: 4 or greater and Ondansetron, Dexamethasone, Midazolam, Scopolamine patch - Pre-op and Treatment may vary due to age or medical condition  Airway Management Planned: LMA  Additional Equipment:   Intra-op Plan:   Post-operative Plan: Extubation in OR  Informed Consent: I have reviewed the patients History and Physical, chart, labs and discussed the procedure including the risks, benefits and alternatives for the proposed anesthesia with the patient or authorized representative who has indicated his/her understanding and acceptance.   Dental advisory given  Plan Discussed with: CRNA  Anesthesia Plan Comments:          Anesthesia Quick Evaluation

## 2018-03-31 NOTE — Anesthesia Procedure Notes (Signed)
Procedure Name: LMA Insertion Date/Time: 03/31/2018 7:38 AM Performed by: Imagene Riches, CRNA Pre-anesthesia Checklist: Patient identified, Emergency Drugs available, Suction available and Patient being monitored Patient Re-evaluated:Patient Re-evaluated prior to induction Oxygen Delivery Method: Circle System Utilized Preoxygenation: Pre-oxygenation with 100% oxygen Induction Type: IV induction Ventilation: Mask ventilation without difficulty LMA: LMA inserted LMA Size: 4.0 Number of attempts: 1 Airway Equipment and Method: Bite block Placement Confirmation: positive ETCO2 Tube secured with: Tape Dental Injury: Teeth and Oropharynx as per pre-operative assessment

## 2018-03-31 NOTE — Interval H&P Note (Signed)
History and Physical Interval Note:  03/31/2018 7:09 AM  Meagan Ryan  has presented today for surgery, with the diagnosis of cancer right breast  The various methods of treatment have been discussed with the patient and family. After consideration of risks, benefits and other options for treatment, the patient has consented to  Procedure(s): RIGHT TOTAL MASTECTOMY WITH AXILLARY SENTINEL [place] (Right) as a surgical intervention .  The patient's history has been reviewed, patient examined, no change in status, stable for surgery.  I have reviewed the patient's chart and labs.  Questions were answered to the patient's satisfaction.     Darene Lamer Hennesy Sobalvarro

## 2018-03-31 NOTE — Anesthesia Postprocedure Evaluation (Signed)
Anesthesia Post Note  Patient: Meagan Ryan  Procedure(s) Performed: RIGHT TOTAL MASTECTOMY WITH AXILLARY SENTINEL LYMPH NODE BIOPSY ERAS PATHWAY (Right Breast)     Patient location during evaluation: PACU Anesthesia Type: General Level of consciousness: awake and alert Pain management: pain level controlled Vital Signs Assessment: post-procedure vital signs reviewed and stable Respiratory status: spontaneous breathing, nonlabored ventilation, respiratory function stable and patient connected to nasal cannula oxygen Cardiovascular status: blood pressure returned to baseline and stable Postop Assessment: no apparent nausea or vomiting Anesthetic complications: no    Last Vitals:  Vitals:   03/31/18 0946 03/31/18 1013  BP: 129/72 129/75  Pulse: 62   Resp: 11   Temp:  (!) 36.4 C  SpO2: 98% 98%    Last Pain:  Vitals:   03/31/18 1146  TempSrc:   PainSc: 0-No pain                 Montez Hageman

## 2018-03-31 NOTE — Op Note (Signed)
Preoperative Diagnosis: cancer right breast  Postoprative Diagnosis: cancer right breast  Procedure: Procedure(s): RIGHT TOTAL MASTECTOMY WITH AXILLARY SENTINEL LYMPH NODE BIOPSY ERAS PATHWAY   Surgeon: Excell Seltzer T   Assistants: one  Anesthesia:  General LMA anesthesia  Indications: 62 year old female with a new diagnosis of cancer of the right breast, upper outer quadrant. Clinical stage one a, ER positive, PR positive, HER-2 negative.  She has a remote history of left breast cancer treated with modified mastectomy and TRAM flap reconstruction in the 1990s.  After discussion of treatment options and risks extensively detailed elsewhere she has elected to proceed with right total mastectomy and axillary sentinel lymph node biopsy as initial surgical therapy.      Procedure Detail: In the holding area the patient underwent injection of 1 mCi of technetium sulfur colloid intradermally around the right nipple.  She underwent a pectoral block by anesthesia.  She was taken to the operating room, placed in the supine position on the operating table and laryngeal mask general anesthesia induced.  She was carefully positioned with her right arm extended and the entire right anterior chest, neck and upper arm were widely sterilely prepped and draped.  Patient timeout was performed and correct procedure verified.  A transversely oriented elliptical incision was used encompassing the central breast skin and nipple areolar complex.  Using the plasma blade skin and subtenons flaps were raised superiorly toward the clavicle, medially to the edge of the sternum, inferiorly to the origin of the rectus and laterally out to the anterior border of the latissimus which was identified and dissected.  The dissection was deepened down to the chest wall.  The specimen was then reflected off the chest wall working medial to lateral dividing attachments to the pectoralis major and serratus.  The lateral border the  pectoralis was freed and the specimen was dissected anteriorly up off of the inferior latissimus border.  Dissection progressed up to the axilla until there was attachment only to the axillary contents.  The clavipectoral fascia was incised.  Palpation in the axilla did reveal one slightly enlarged firm node and with the neoprobe it did have with elevated counts and was excised as a sentinel lymph node.  I could not feel any other abnormal nodes and could not detect any significant elevated counts in the axilla following this.  I then came across the low axilla with Kelly clamps and tied with 3-0 Vicryl.  During this dissection there was a slightly enlarged node encountered which had minimal blood present counts of this was sent as a second axillary sentinel lymph node.  The specimen was detached and oriented and sent for permanent pathology.  The wound was thoroughly irrigated and hemostasis assured.  A 19 Blake closed suction drain was brought out through separate stab wound and left under both flaps.  Subcutaneous tissue was closed with interrupted 3-0 Vicryl and the skin with running subcuticular 4-0 Monocryl and Dermabond.  Sponge needle and instrument counts were correct.    Findings: As above  Estimated Blood Loss:  less than 50 mL         Drains: 19 Blake drain  Blood Given: none          Specimens: #1 right total mastectomy #2 right axillary sentinel lymph nodes X 2        Complications:  * No complications entered in OR log *         Disposition: PACU - hemodynamically stable.  Condition: stable

## 2018-03-31 NOTE — Transfer of Care (Signed)
Immediate Anesthesia Transfer of Care Note  Patient: Meagan Ryan  Procedure(s) Performed: RIGHT TOTAL MASTECTOMY WITH AXILLARY SENTINEL LYMPH NODE BIOPSY ERAS PATHWAY (Right Breast)  Patient Location: PACU  Anesthesia Type:GA combined with regional for post-op pain  Level of Consciousness: awake, alert  and oriented  Airway & Oxygen Therapy: Patient Spontanous Breathing and Patient connected to nasal cannula oxygen  Post-op Assessment: Report given to RN and Post -op Vital signs reviewed and stable  Post vital signs: Reviewed and stable  Last Vitals:  Vitals Value Taken Time  BP    Temp    Pulse 65 03/31/2018  9:24 AM  Resp 18 03/31/2018  9:24 AM  SpO2 100 % 03/31/2018  9:24 AM  Vitals shown include unvalidated device data.  Last Pain:  Vitals:   03/31/18 0612  TempSrc:   PainSc: 0-No pain      Patients Stated Pain Goal: 3 (62/69/48 [id number])  Complications: No apparent anesthesia complications

## 2018-03-31 NOTE — Anesthesia Procedure Notes (Signed)
Anesthesia Regional Block: Pectoralis block   Pre-Anesthetic Checklist: ,, timeout performed, Correct Patient, Correct Site, Correct Laterality, Correct Procedure, Correct Position, site marked, Risks and benefits discussed,  Surgical consent,  Pre-op evaluation,  At surgeon's request and post-op pain management  Laterality: Right and Upper  Prep: Maximum Sterile Barrier Precautions used, chloraprep       Needles:  Injection technique: Single-shot  Needle Type: Echogenic Stimulator Needle     Needle Length: 10cm      Additional Needles:   Procedures:,,,, ultrasound used (permanent image in chart),,,,  Narrative:  Start time: 03/31/2018 7:03 AM End time: 03/31/2018 7:13 AM Injection made incrementally with aspirations every 5 mL.  Performed by: Personally  Anesthesiologist: Montez Hageman, MD  Additional Notes: Risks, benefits and alternative to block explained extensively.  Patient tolerated procedure well, without complications.

## 2018-03-31 NOTE — Progress Notes (Signed)
1015  Received pt from PACU, A&O x4. Right chest and axilla incision with skin glue dry and intact, JP drain in place. Pt denies pain. Showed to pt's daughter on how to empty JP drain.

## 2018-04-01 ENCOUNTER — Encounter (HOSPITAL_COMMUNITY): Payer: Self-pay | Admitting: General Surgery

## 2018-04-01 DIAGNOSIS — C50411 Malignant neoplasm of upper-outer quadrant of right female breast: Secondary | ICD-10-CM | POA: Diagnosis not present

## 2018-04-01 LAB — BASIC METABOLIC PANEL
ANION GAP: 6 (ref 5–15)
BUN: 12 mg/dL (ref 6–20)
CALCIUM: 8.9 mg/dL (ref 8.9–10.3)
CHLORIDE: 107 mmol/L (ref 101–111)
CO2: 26 mmol/L (ref 22–32)
Creatinine, Ser: 0.99 mg/dL (ref 0.44–1.00)
GFR calc non Af Amer: 59 mL/min — ABNORMAL LOW (ref >60)
Glucose, Bld: 144 mg/dL — ABNORMAL HIGH (ref 65–99)
Potassium: 4.9 mmol/L (ref 3.5–5.1)
Sodium: 139 mmol/L (ref 135–145)

## 2018-04-01 LAB — CBC
HEMATOCRIT: 34.7 % — AB (ref 36.0–46.0)
HEMOGLOBIN: 11.4 g/dL — AB (ref 12.0–15.0)
MCH: 30.1 pg (ref 26.0–34.0)
MCHC: 32.9 g/dL (ref 30.0–36.0)
MCV: 91.6 fL (ref 78.0–100.0)
Platelets: 231 10*3/uL (ref 150–400)
RBC: 3.79 MIL/uL — ABNORMAL LOW (ref 3.87–5.11)
RDW: 14.5 % (ref 11.5–15.5)
WBC: 13.6 10*3/uL — AB (ref 4.0–10.5)

## 2018-04-01 MED ORDER — IBUPROFEN 600 MG PO TABS
600.0000 mg | ORAL_TABLET | Freq: Four times a day (QID) | ORAL | Status: DC | PRN
Start: 2018-04-01 — End: 2018-04-01
  Administered 2018-04-01: 600 mg via ORAL
  Filled 2018-04-01: qty 1

## 2018-04-01 NOTE — Progress Notes (Signed)
Discharge home. Home discharge instruction given, no question verbalized. 

## 2018-04-01 NOTE — Plan of Care (Signed)
  Problem: Education: Goal: Knowledge of General Education information will improve Outcome: Progressing   Problem: Health Behavior/Discharge Planning: Goal: Ability to manage health-related needs will improve Outcome: Progressing   Problem: Skin Integrity: Goal: Risk for impaired skin integrity will decrease Outcome: Progressing   

## 2018-04-06 ENCOUNTER — Telehealth: Payer: Self-pay | Admitting: Hematology

## 2018-04-06 NOTE — Telephone Encounter (Signed)
Spoke with patient regarding appt per 5/29  Letter/ calendar mailed to patient per 5/29 sch msg

## 2018-04-10 ENCOUNTER — Other Ambulatory Visit: Payer: Self-pay | Admitting: Cardiology

## 2018-04-12 NOTE — Discharge Summary (Signed)
Physician Discharge Summary  ARDELIA Ryan UYQ:[id number] DOB: [birth year] DOA: 03/31/2018  PCP: Hulan Fess, MD  Admit date: 03/31/2018 Discharge date: 04/12/2018  Recommendations for Outpatient Follow-up:  1.  (include homehealth, outpatient follow-up instructions, specific recommendations for PCP to follow-up on, etc.)  Follow-up Information    Excell Seltzer, MD Follow up in 3 week(s).   Specialty:  General Surgery Contact information: [address] [place] [address] [phone]          Discharge Diagnoses:  Active Problems:   Stage I breast cancer, right [facility])   Surgical Procedure: right total mastectomy with axillary SNB  Discharge Condition: Good Disposition: Home  Diet recommendation: regular diet   Hospital Course:  62 yo female underwent right total mastectomy for breast cancer. Post op she was admitted to surgical floor for post op care. POD 1 she was doing well, tolerating a diet, ambulating well, pain well controlled. Her drain put out serosanguinous drainage. She underwent drain education and was discharged home.  Discharge Instructions  Discharge Instructions    Call MD for:  difficulty breathing, headache or visual disturbances   Complete by:  As directed    Call MD for:  hives   Complete by:  As directed    Call MD for:  persistant nausea and vomiting   Complete by:  As directed    Call MD for:  redness, tenderness, or signs of infection (pain, swelling, redness, odor or green/yellow discharge around incision site)   Complete by:  As directed    Call MD for:  severe uncontrolled pain   Complete by:  As directed    Call MD for:  temperature >100.4   Complete by:  As directed    Diet - low sodium heart healthy   Complete by:  As directed    Discharge wound care:   Complete by:  As directed    Maintain drain and empty daily   Increase activity slowly   Complete by:  As directed      Allergies as of 04/01/2018    Reactions   Amlodipine Shortness Of Breath   Noroxin [norfloxacin] Shortness Of Breath   Cephalexin Other (See Comments), Nausea And Vomiting   Fever got to 105 High temperature   Chocolate    Migraines    Codeine Nausea And Vomiting   Sulfa Antibiotics Nausea And Vomiting   Latex Itching, Rash   redness   Penicillins Rash   Has patient had a PCN reaction causing immediate rash, facial/tongue/throat swelling, SOB or lightheadedness with hypotension:No Has patient had a PCN reaction causing severe rash involving mucus membranes or skin necrosis: Yes Has patient had a PCN reaction that required hospitalization No Has patient had a PCN reaction occurring within the last 10 years: No If all of the above answers are "NO", then may proceed with Cephalosporin use.      Medication List    TAKE these medications   ALPRAZolam 0.5 MG tablet Commonly known as:  XANAX Take 0.25 mg by mouth 2 (two) times daily.   azelastine 0.1 % nasal spray Commonly known as:  ASTELIN Place 1 spray into both nostrils 2 (two) times daily as needed for rhinitis or allergies.   carvedilol 3.125 MG tablet Commonly known as:  COREG TAKE 1 TABLET TWICE A DAY WITH MEALS   cetirizine 10 MG tablet Commonly known as:  ZYRTEC Take 10 mg by mouth every morning.   CORLANOR 5 MG Tabs tablet Generic drug:  ivabradine TAKE 1 TABLET TWICE A DAY WITH MEALS   DULoxetine 30 MG capsule Commonly known as:  CYMBALTA Take 30 mg by mouth at bedtime.   ENTRESTO 24-26 MG Generic drug:  sacubitril-valsartan TAKE 1 TABLET TWICE A DAY   Fish Oil 1000 MG Caps Take 2,000 mg by mouth 2 (two) times daily.   fluticasone 50 MCG/ACT nasal spray Commonly known as:  FLONASE Place 1 spray into both nostrils every morning.   gabapentin 300 MG capsule Commonly known as:  NEURONTIN Take 300 mg by mouth 2 (two) times daily.   ibuprofen 200 MG tablet Commonly known as:  ADVIL,MOTRIN Take 600 mg by mouth daily as needed for  moderate pain.   niacin 500 MG tablet Take 500 mg by mouth daily.   nortriptyline 25 MG capsule Commonly known as:  PAMELOR Take 25 mg by mouth at bedtime as needed for sleep.   omeprazole 20 MG capsule Commonly known as:  PRILOSEC Take 20 mg by mouth daily.   raloxifene 60 MG tablet Commonly known as:  EVISTA Take 60 mg by mouth every morning.            Discharge Care Instructions  (From admission, onward)        Start     Ordered   04/01/18 0000  Discharge wound care:    Comments:  Maintain drain and empty daily   04/01/18 1008     Follow-up Information    Excell Seltzer, MD Follow up in 3 week(s).   Specialty:  General Surgery Contact information: [place] [address] [phone]            The results of significant diagnostics from this hospitalization (including imaging, microbiology, ancillary and laboratory) are listed below for reference.    Significant Diagnostic Studies: Nm Sentinel Node Inj-no Rpt (breast)  Result Date: 03/31/2018 Sulfur colloid was injected by the nuclear medicine technologist for melanoma sentinel node.    Labs: Basic Metabolic Panel: No results for input(s): NA, K, CL, CO2, GLUCOSE, BUN, CREATININE, CALCIUM, MG, PHOS in the last 168 hours. Liver Function Tests: No results for input(s): AST, ALT, ALKPHOS, BILITOT, PROT, ALBUMIN in the last 168 hours.  CBC: No results for input(s): WBC, NEUTROABS, HGB, HCT, MCV, PLT in the last 168 hours.  CBG: No results for input(s): GLUCAP in the last 168 hours.  Active Problems:   Stage I breast cancer, right [facility])   Time coordinating discharge: 70min

## 2018-04-20 ENCOUNTER — Other Ambulatory Visit: Payer: Self-pay | Admitting: General Surgery

## 2018-04-20 ENCOUNTER — Ambulatory Visit
Admission: RE | Admit: 2018-04-20 | Discharge: 2018-04-20 | Disposition: A | Discharge status: Home / Self Care | Source: Ambulatory Visit | Attending: General Surgery | Admitting: General Surgery

## 2018-04-20 DIAGNOSIS — R059 Cough, unspecified: Secondary | ICD-10-CM

## 2018-04-20 DIAGNOSIS — R05 Cough: Secondary | ICD-10-CM

## 2018-04-20 MED ORDER — IOPAMIDOL (ISOVUE-M 200) INJECTION 41%: 1.0000 mL | Freq: Once | INTRAMUSCULAR | Status: DC

## 2018-04-20 MED ORDER — METHYLPREDNISOLONE ACETATE 40 MG/ML INJ SUSP (RADIOLOG: 120.0000 mg | Freq: Once | INTRAMUSCULAR | Status: DC

## 2018-04-29 NOTE — Progress Notes (Signed)
[place]  [phone] Fax:([phone]) [phone]  Clinic Follow up Note   Patient Care Team: Hulan Fess, MD as PCP - General (Family Medicine) Sueanne Margarita, MD as PCP - Cardiology (Cardiology) Sueanne Margarita, MD as Consulting Physician (Cardiology) Gery Pray, MD as Consulting Physician (Radiation Oncology) Excell Seltzer, MD as Consulting Physician (General Surgery) 05/01/2018  CHIEF COMPLAINTS:  Right breast invasive ductal carcinoma   Oncology History   Cancer Staging Malignant neoplasm of upper-outer quadrant of right breast in female, estrogen receptor positive ([place]) Staging form: Breast, AJCC 8th Edition - Clinical stage from 02/21/2018: Stage IA (cT1b, cN0, cM0, G1, ER+, PR+, HER2-) - Signed by Truitt Merle, MD on 03/01/2018       Malignant neoplasm of upper-outer quadrant of right breast in female, estrogen receptor positive ([place])   12/25/2015 Imaging    12/25/2015 Digital Screen Unilat R IMPRESSION: Further evaluation is suggested for possible distortion in the right breast.      01/02/2016 Imaging    01/02/2016 MM DIAG Breast TOMO Uni Right IMPRESSION: Further evaluation is suggested for possible distortion in the right breast.      01/12/2017 Imaging    01/12/2017 MM Digital Screening Unilat R IMPRESSION: No mammographic evidence of malignancy. A result letter of this screening mammogram will be mailed directly to the patient.      02/07/2018 Imaging    02/07/2018 MM 3D Screen Breast UNI Right IMPRESSION: Further evaluation is suggested for possible mass in the right breast.       02/16/2018 Mammogram    IMPRESSION: Irregular hypoechoic mass in the RIGHT breast at the 12 o'clock axis, retroareolar, measuring 7 mm, corresponding to the new mass seen on mammogram. This is a suspicious finding for which ultrasound-guided biopsy is recommended.      02/21/2018 Initial Biopsy    Diagnosis 02/21/18 Breast, right, needle core  biopsy, 12 o'clock - INVASIVE DUCTAL CARCINOMA. - DUCTAL CARCINOMA IN SITU. - SEE COMMENT.      02/21/2018 Receptors her2    Prognostic indicators significant for: ER, 100% positive and PR, 100% positive or negative, both with strong staining intensity. Proliferation marker Ki67 at 5%. HER2 negative.      02/21/2018 Cancer Staging    Staging form: Breast, AJCC 8th Edition - Clinical stage from 02/21/2018: Stage IA (cT1b, cN0, cM0, G1, ER+, PR+, HER2-) - Signed by Truitt Merle, MD on 03/01/2018      02/21/2018 Imaging    02/21/2018 [place] RT Breast BX W LOC DEV 1st Lesion IMG BX SPEC US Guide IMPRESSION: Ultrasound-guided biopsy of the RIGHT breast mass at the 12 o'clock axis. No apparent complications      11/13/2692 Initial Diagnosis    Malignant neoplasm of upper-outer quadrant of right breast in female, estrogen receptor positive ([place])      03/31/2018 Surgery    03/31/2018 Right total mastectomy 62 year old female with a new diagnosis of cancer of the right breast, upper outer quadrant. Clinical stage one a, ER positive, PR positive, HER-2 negative.  She has a remote history of left breast cancer treated with modified mastectomy and TRAM flap reconstruction in the 1990s.  After discussion of treatment options and risks extensively detailed elsewhere she has elected to proceed with right total mastectomy and axillary sentinel lymph node biopsy as initial surgical therapy.      03/31/2018 Pathology Results    03/31/2018 Diagnosis 1. Breast, simple mastectomy, Right Total - INVASIVE AND IN SITU DUCTAL CARCINOMA, 0.6 CM. -  MARGINS NOT INVOLVED. - FIBROCYSTIC CHANGES. - BIOPSY SITE AND BIOPSY CLIP. 2. Lymph node, sentinel, biopsy, Right Axillary #1 - ONE BENIGN LYMPH NODE (0/1). 3. Lymph node, sentinel, biopsy, Right Axillary #2 - ONE BENIGN LYMPH NODE (0/1).       05/01/2018 -  Anti-estrogen oral therapy    Discontinued Evista. Anastrozole 1 mg daily       HISTORY OF PRESENTING  ILLNESS:  Meagan Ryan 62 y.o. female is a here because of newly diagnosed right breast cancer. The patient was referred by her PCP Dr. Rex Kras. The patient presents to the clinic today accompanied by daughter.  Pt reports she had breast cancer previously in 1996. She had a left breast mastectomy with sentinel node resection. She received adjuvant chemotherapy. She has been taking Evista since she completed chemo per her PCP Dr. Rex Kras. Her oncologist was Dr. Benay Spice.   Prior to pt's abnormal mammogram, she did not feel any abnormality. She states she has been compliant with yearly screening since her previous breast cancer diagnosis.   Pt's screening mammogram from 02/15/18 warranted further evaluation. Her diagnostic mammogram from 02/16/18 revealed an irregular hypoechoic mass in the right breast at the 12 o'clock position measuring 7 mm. Her initial biopsy confirmed invasive ductal carcinoma.   Prognostic indicators significant for: ER, 100% positive and PR, 100% positive or negative, both with strong staining intensity. Proliferation marker Ki67 at 5%. HER2 negative.  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 She has a surgical history of a partial hysterectomy, tubal ligation, nephrectomy, and cholecystectomy.    GYN HISTORY  Menarchal: 12 LMP: 48 Contraceptive: None  HRT: None  G1P1:  She has a FMHx of prostate cancer by her father, and colon cancer by her sister. She denies tobacco use or alcohol use.   Socially, she is windowed and lives at home with her daughter. She previously was in the [employer] and worked at [employer].   CURRENT THERAPY: pending adjuvant anastrozole 38m daily starting in a few weeks   INTERVAL HISTORY: Meagan CAISONis a 62y.o. female who is here today after surgery for follow-up. She presents to  the clinic by herself. She feel good.    She underwent right mastectomy on Mar 28, 2018.  She tolerated surgery very well, no significant incision pain, or other major complaints.  She developed productive cough with green sputum associated with low grade fever, headache, and nasal congestion after surgery. It has been going on for about 2 weeks.  Her fever is now resolved. She sometimes feels dizzy. She states that her CXR after surgery was normal. No joint pain.  She has not received any antibiotics for the cough.    MEDICAL HISTORY:  Past Medical History:  Diagnosis Date  . Allergic rhinitis   . Asthma   . Breast cancer ([place]   left breast-stage II infiltrating ductal carcinoma, 3/8 axillary lymph nodes were positive for metastatic carcinoma, getting yearly mammogram and every other year MRI followed by oncologist Dr SBenay Spice . CHF (congestive heart failure) ([place]   . Chronic combined systolic and diastolic heart failure ([facility] 02/26/2016  . Complication of anesthesia    WAKE UP WITH HEADACHE, difficulty waking up  . DCM (dilated cardiomyopathy) ([place]    EF normalized at 60-65% on echo 07/2016  . Dysuria 2011  . Family history  of adverse reaction to anesthesia    "family wakes up with personality changes, but are very short term"  . Family history of breast cancer   . Fatty liver CT 1/12   elevated LFT  . Genetic testing 03/13/2018   Common cancers panel (47 genes) @ [facility] - No pathogenic mutations detected  . H/O actinic keratosis   . H/O allergic rhinitis   . H/O fibromyalgia   . H/O osteopenia 2009  . H/O peripheral neuropathy   . H/O varicose veins   . History of breast cancer   . History of IBS   . Hx of colonic polyps   . Hx of migraines   . Hx of ovarian cyst   . Hyperlipidemia   . Hypertension   . Incomplete RBBB   . 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 OSA (obstructive sleep apnea) 12/31/2015   Very mild with AHI 6/hr but elevated RDI at 32/hr.  .  Pelvic pain in female   . PMB (postmenopausal bleeding) 2011  . PONV (postoperative nausea and vomiting)   . Pregnancy induced hypertension   . Right-sided lacunar infarction ([place])    MRI 1.2012  . Tubular adenoma of colon     SURGICAL HISTORY: Past Surgical History:  Procedure Laterality Date  . gastroc muscle repair  2007  . LAPAROSCOPIC CHOLECYSTECTOMY    . MASTECTOMY  06/1995   Left - for infiltrating ductal carcinoma  . MASTECTOMY W/ SENTINEL NODE BIOPSY Right 03/31/2018  . MASTECTOMY W/ SENTINEL NODE BIOPSY Right 03/31/2018   Procedure: RIGHT TOTAL MASTECTOMY WITH AXILLARY SENTINEL LYMPH NODE BIOPSY ERAS PATHWAY;  Surgeon: Excell Seltzer, MD;  Location: [facility];  Service: General;  Laterality: Right;  . 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 ROBOTIC ASSISTED TOTAL HYSTERECTOMY WITH BILATERAL SALPINGO OOPHERECTOMY Bilateral 10/09/2015   Procedure: ROBOTIC ASSISTED BILATERAL SALPINGO OOPHORECTOMY ;  Surgeon: Everitt Amber, MD;  Location: [facility];  Service: Gynecology;  Laterality: Bilateral;  . tram surgery  1996  . TUBAL LIGATION      SOCIAL HISTORY: Social History   Socioeconomic History  . Marital status: Widowed    Spouse name: Not on file  . Number of children: Not on file  . Years of education: Not on file  . Highest education level: Not on file  Occupational History  . Not on file  Social Needs  . Financial resource strain: Not on file  . Food insecurity:    Worry: Not on file    Inability: Not on file  . Transportation needs:    Medical: Not on file    Non-medical: Not on file  Tobacco Use  . Smoking status: Never Smoker  . Smokeless tobacco: Never Used  Substance and Sexual Activity  . Alcohol use: No  . Drug use: No  . Sexual activity: Not on file  Lifestyle  . Physical activity:    Days per week: Not on file    Minutes per session: Not on file  . Stress: Not on file  Relationships  . Social connections:    Talks on phone: Not on  file    Gets together: Not on file    Attends religious service: Not on file    Active member of club or organization: Not on file    Attends meetings of clubs or organizations: Not on file    Relationship status: Not on file  . Intimate partner violence:  Fear of current or ex partner: Not on file    Emotionally abused: Not on file    Physically abused: Not on file    Forced sexual activity: Not on file  Other Topics Concern  . 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 Wears Ear plugs   Marital Status: Widowed   1 daughter    FAMILY HISTORY: Family History  Problem Relation Age of Onset  . Heart disease Mother        MI  . Stroke Mother   . Diabetes Mother   . Hypertension Mother   . Stroke Father   . Prostate cancer Father 76       deceased 25  . Colon cancer Sister 31       currently 101  . Breast cancer Paternal Aunt 36       deceased 79s  . Breast cancer Cousin        daughter of pat aunt w/ breast ca  . Breast cancer Cousin        daughter of pat aunt w/ breast ca    ALLERGIES:  is allergic to amlodipine; noroxin [norfloxacin]; cephalexin; chocolate; codeine; sulfa antibiotics; latex; and penicillins.  MEDICATIONS:  Current Outpatient Medications  Medication Sig Dispense Refill  . ALPRAZolam (XANAX) 0.5 MG tablet Take 0.25 mg by mouth 2 (two) times daily.     Marland Kitchen azelastine (ASTELIN) 0.1 % nasal spray Place 1 spray into both nostrils 2 (two) times daily as needed for rhinitis or allergies.     . carvedilol (COREG) 3.125 MG tablet TAKE 1 TABLET TWICE A DAY WITH MEALS 180 tablet 3  . cetirizine (ZYRTEC) 10 MG tablet Take 10 mg by mouth every morning.     Steffanie Dunn 5 MG TABS tablet TAKE 1 TABLET TWICE A DAY WITH MEALS 180 tablet 3  . DULoxetine (CYMBALTA) 30 MG capsule  Take 30 mg by mouth at bedtime.     Marland Kitchen ENTRESTO 24-26 MG TAKE 1 TABLET TWICE A DAY 180 tablet 3  . fluticasone (FLONASE) 50 MCG/ACT nasal spray Place 1 spray into both nostrils every morning.     . gabapentin (NEURONTIN) 300 MG capsule Take 300 mg by mouth 2 (two) times daily.    Marland Kitchen ibuprofen (ADVIL,MOTRIN) 200 MG tablet Take 600 mg by mouth daily as needed for moderate pain.    . niacin 500 MG tablet Take 500 mg by mouth daily.    . nortriptyline (PAMELOR) 25 MG capsule Take 25 mg by mouth at bedtime as needed for sleep.     . Omega-3 Fatty Acids (FISH OIL) 1000 MG CAPS Take 2,000 mg by mouth 2 (two) times daily.    Marland Kitchen omeprazole (PRILOSEC) 20 MG capsule Take 20 mg by mouth daily.    . raloxifene (EVISTA) 60 MG tablet Take 60 mg by mouth every morning.     Marland Kitchen anastrozole (ARIMIDEX) 1 MG tablet Take 1 tablet (1 mg total) by mouth daily. 30 tablet 5  . azithromycin (ZITHROMAX) 500 MG tablet Take 1 tablet (500 mg total) by mouth daily. 5 tablet 0   No current facility-administered medications for this visit.     REVIEW OF SYSTEMS:   Constitutional: Denies fevers, chills or abnormal night sweats (+) fever few days ago,  now resolved Eyes: Denies blurriness of vision, double vision or watery eyes (+) Meniere disease Ears, nose, mouth, throat, and face: Denies mucositis or sore throat (+) left ear deafness  Respiratory: Denies dyspnea or wheezes (+) productive cough and nasal congestion Cardiovascular: Denies palpitation, chest discomfort or lower extremity swelling Gastrointestinal:  Denies nausea, heartburn or change in bowel habits Skin: Denies abnormal skin rashes Lymphatics: Denies new lymphadenopathy or easy bruising Neurological:Denies numbness, tingling or new weaknesses (+) Headache Behavioral/Psych: Mood is stable, no new changes  All other systems were reviewed with the patient and are negative.  PHYSICAL EXAMINATION: ECOG PERFORMANCE STATUS: 0 - Asymptomatic  Vitals:   05/01/18  1502  BP: (!) 142/88  Pulse: 83  Resp: 18  Temp: 97.8 F (36.6 C)  SpO2: 100%   Filed Weights   05/01/18 1502  Weight: 222 lb 12.8 oz (101.1 kg)    GENERAL:alert, no distress and comfortable SKIN: skin color, texture, turgor are normal, no rashes or significant lesions EYES: normal, conjunctiva are pink and non-injected, sclera clear OROPHARYNX:no exudate, no erythema and lips, buccal mucosa, and tongue normal  NECK: supple, thyroid normal size, non-tender, without nodularity LYMPH:  no palpable lymphadenopathy in the cervical, axillary or inguinal LUNGS: clear to auscultation and percussion with normal breathing effort (+) Cough and nasal congestion HEART: regular rate & rhythm and no murmurs and no lower extremity edema ABDOMEN:abdomen soft, non-tender and normal bowel sounds Musculoskeletal:no cyanosis of digits and no clubbing  PSYCH: alert & oriented x 3 with fluent speech NEURO: no focal motor/sensory deficits Breasts:Bilater mastectomy healing properly. No erythema, discharge, or cellulitis.  LABORATORY DATA:  I have reviewed the data as listed CBC Latest Ref Rng & Units 04/01/2018 03/24/2018 12/14/2017  WBC 4.0 - 10.5 K/uL 13.6(H) 5.5 5.5  Hemoglobin 12.0 - 15.0 g/dL 11.4(L) 13.7 13.7  Hematocrit 36.0 - 46.0 % 34.7(L) 42.6 41.9  Platelets 150 - 400 K/uL 231 248 206   CMP Latest Ref Rng & Units 04/01/2018 03/24/2018 12/14/2017  Glucose 65 - 99 mg/dL 144(H) 93 109(H)  BUN 6 - 20 mg/dL _0 Creatinine 0.44 - 1.00 mg/dL 0.99 1.15(H) 0.98  Sodium 135 - 145 mmol/L 139 139 137  Potassium 3.5 - 5.1 mmol/L 4.9 4.2 3.8  Chloride 101 - 111 mmol/L 107 104 104  CO2 22 - 32 mmol/L _1 Calcium 8.9 - 10.3 mg/dL 8.9 9.2 9.4  Total Protein 6.5 - 8.1 g/dL - - -  Total Bilirubin 0.3 - 1.2 mg/dL - - -  Alkaline Phos 38 - 126 U/L - - -  AST 15 - 41 U/L - - -  ALT 14 - 54 U/L - - -   PATHOLOGY 03/31/2018: Diagnosis 1. Breast, simple mastectomy, Right Total - INVASIVE AND IN  SITU DUCTAL CARCINOMA, 0.6 CM. - MARGINS NOT INVOLVED. - FIBROCYSTIC CHANGES. - BIOPSY SITE AND BIOPSY CLIP. 2. Lymph node, sentinel, biopsy, Right Axillary #1 - ONE BENIGN LYMPH NODE (0/1). 3. Lymph node, sentinel, biopsy, Right Axillary #2 - ONE BENIGN LYMPH NODE (0/1).  Microscopic Comment 1. INVASIVE CARCINOMA OF THE BREAST: Resection Procedure: Mastectomy and two sentinel lymph nodes. Specimen Laterality: Right breast. Tumor Size: 0.6 cm Histologic Type: Ductal with extracellular mucin. Histologic Grade: I Glandular (Acinar)/Tubular Differentiation: 1 Nuclear Pleomorphism: 2 Mitotic Rate: 1 Overall Grade: 4 Ductal Carcinoma In Situ: Present Tumor Extension (required only if the structures are present and involved) (select all that apply): No Margins: Free of tumor Distance from  closest margin (millimeters): 70 mm Specify closest margin (required only if <78m): Anterior DCIS Margins (required only if DCIS is present in specimen): Distance from closest margin (millimeters): 70 mm Specify closest margin (required only if <175m: Anterior Cannot be determined (explain): N/A Regional Lymph Nodes: Number of Lymph Nodes Examined: 2 Number of Sentinel Nodes Examined (if applicable): 2 Number of Lymph Nodes with Macrometastases (>2 mm): 0 Number of Lymph Nodes with Micrometastases: 0 Number of Lymph Nodes with Isolated Tumor Cells (?0.2 mm or ?200 cells)#: 0 Size of Largest Metastatic Deposit (millimeters): N/A Extranodal Extension: N/A Treatment Effect: No known presurgical therapy Breast Biomarker Testing Performed on Previous Biopsy: Yes Testing Performed on Case Number: [id number] Receptor: 100%, positive, strong staining. Progesterone Receptor: 100%, positive, strong staining. HER2: Negative, ratio 1.41 Representative tumor block: 1A Pathologic Stage Classification (pTNM, AJCC 8th Edition): pT1b, pN0  Diagnosis 02/21/18 Breast, right, needle core biopsy, 12  o'clock - INVASIVE DUCTAL CARCINOMA. - DUCTAL CARCINOMA IN SITU. - SEE COMMENT. Microscopic Comment The carcinoma partially involves a papilloma. A breast prognostic profile will be attempted on the invasive component and the results reported separately. The results were called to [facility] 02/22/2018. (JBK:ecj 02/22/2018) Results: Estrogen Receptor: 100%, POSITIVE, STRONG STAINING INTENSITY Progesterone Receptor: 100%, POSITIVE, STRONG STAINING INTENSITY Proliferation Marker Ki67: 5% HER2 - NEGATIVE  RADIOGRAPHIC STUDIES: I have personally reviewed the radiological images as listed and agreed with the findings in the report.  Diagnostic Mammogram and [place]/11/19 IMPRESSION: Irregular hypoechoic mass in the RIGHT breast at the 12 o'clock axis, retroareolar, measuring 7 mm, corresponding to the new mass seen on mammogram. This is a suspicious finding for which ultrasound-guided biopsy is recommended.  Screening Mammogram 02/07/18 IMPRESSION: Further evaluation is suggested for possible mass in the right breast.  Dg Chest 2 View  Result Date: 04/20/2018 CLINICAL DATA:  Post mastectomy back pain, cough EXAM: CHEST - 2 VIEW COMPARISON:  Chest x-ray of 12/14/2017 FINDINGS: No active infiltrate or effusion is seen. Mediastinal hilar contours are unremarkable. The heart is within upper limits of normal and stable. Multiple clips overlie the left breast and epigastrium. No bony abnormality is seen. A surgical drain remains. IMPRESSION: No active cardiopulmonary disease. Electronically Signed   By: PaIvar Drape.D.   On: 04/20/2018 08:04    ASSESSMENT & PLAN:  Meagan ARISTAs 89.o. post-menopausal woman, presented with screening discovered to breast cancer.   1.  Malignant neoplasm of upper-outer quadrant of right breast, Invasive Ductal Carcinoma and DCIS, pT1bN0M0 stage IA, grade I, ER/PR postive, HER2 negative --He underwent right mastectomy and sentinel lymph  node biopsy.  I reviewed her surgical pathology findings with patient and her husband in details.  Her tumor was 0.6 cm, G1, lymph nodes were all negative, surgical margins were negative. -Given the small tumor, low-grade, negative lymph nodes, this is likely low risk disease.  I did not feel she needs Oncotype.   -Recovering well after surgery.  -She does not need adjuvant radiation  -Given the strongly ER and PR positive disease, I recommend adjuvant aromatase inhibitor for 5 years.  She has been on Evista, will discontinue when she starts AI. -The potential benefit and side effects, which includes but not limited to, hot flash, skin and vaginal dryness, metabolic changes ( increased blood glucose, cholesterol, weight, etc.), slightly in increased risk of cardiovascular disease, cataracts, muscular and joint discomfort, osteopenia and osteoporosis, etc, were discussed with her in great details. She  is interested, will start in 2 to 3 weeks when she recovers completely from surgery. -We also discussed breast cancer surveillance, I recommend routine office visit with lab and exam. -She has had a bilateral mastectomy, no need annal mammogram.  2. Previous left breast cancer in 1996.  -Pt reports she had breast cancer previously in 1996. She had a left breast mastectomy with sentinel node resection. She received adjuvant chemotherapy. She has been taking Evista since she completed chemo per her PCP Dr. Rex Kras. Her oncologist was Dr. Benay Spice  -I will stop Evista when she starts anastrozole   3. Obesity, OSA and dilated cardiomyopathy -Up with his primary care physician and cardiologist Dr. Radford Pax. -Cardiomyopathy was probably related to her previous chemotherapy, recent echo showed EF 50 to 55%.  4. Productive cough  -she has had productive cough for 2 weeks, afebrile  -I call in azithromycin 572m daily for 5 days   PLAN:  - Discontinue Evista and start Anastrozole in 2-3 weeks. - f/u 3 month  for survivorship clinic -lab and f/u 6 months office visit -Azithromycin 5072mdaily given for productive cough and fever  No orders of the defined types were placed in this encounter.   All questions were answered. The patient knows to call the clinic with any problems, questions or concerns. I spent 20 minutes counseling the patient face to face. The total time spent in the appointment was 25 minutes and more than 50% was on counseling.   I,Dierdre Searlesweik am acting as scribe for Dr. YaTruitt Merle I have reviewed the above documentation for accuracy and completeness, and I agree with the above.   YaTruitt MerleMD 05/01/2018

## 2018-05-01 ENCOUNTER — Encounter: Payer: Self-pay | Admitting: Hematology

## 2018-05-01 ENCOUNTER — Telehealth: Payer: Self-pay | Admitting: Hematology

## 2018-05-01 ENCOUNTER — Inpatient Hospital Stay: Attending: Hematology | Admitting: Hematology

## 2018-05-01 VITALS — BP 142/88 | HR 83 | Temp 97.8°F | Resp 18 | Ht 64.0 in | Wt 222.8 lb

## 2018-05-01 DIAGNOSIS — G4733 Obstructive sleep apnea (adult) (pediatric): Secondary | ICD-10-CM | POA: Diagnosis not present

## 2018-05-01 DIAGNOSIS — I420 Dilated cardiomyopathy: Secondary | ICD-10-CM | POA: Insufficient documentation

## 2018-05-01 DIAGNOSIS — Z9013 Acquired absence of bilateral breasts and nipples: Secondary | ICD-10-CM

## 2018-05-01 DIAGNOSIS — Z853 Personal history of malignant neoplasm of breast: Secondary | ICD-10-CM | POA: Diagnosis not present

## 2018-05-01 DIAGNOSIS — R05 Cough: Secondary | ICD-10-CM | POA: Insufficient documentation

## 2018-05-01 DIAGNOSIS — E669 Obesity, unspecified: Secondary | ICD-10-CM | POA: Diagnosis not present

## 2018-05-01 DIAGNOSIS — Z780 Asymptomatic menopausal state: Secondary | ICD-10-CM | POA: Diagnosis not present

## 2018-05-01 DIAGNOSIS — C50411 Malignant neoplasm of upper-outer quadrant of right female breast: Secondary | ICD-10-CM | POA: Insufficient documentation

## 2018-05-01 DIAGNOSIS — R509 Fever, unspecified: Secondary | ICD-10-CM | POA: Insufficient documentation

## 2018-05-01 DIAGNOSIS — Z79811 Long term (current) use of aromatase inhibitors: Secondary | ICD-10-CM | POA: Insufficient documentation

## 2018-05-01 DIAGNOSIS — Z170 Estrogen receptor positive status [ER+]: Secondary | ICD-10-CM | POA: Insufficient documentation

## 2018-05-01 MED ORDER — AZITHROMYCIN 500 MG PO TABS: 500.0000 mg | ORAL_TABLET | Freq: Every day | ORAL | 0 refills | Status: DC

## 2018-05-01 MED ORDER — ANASTROZOLE 1 MG PO TABS: 1.0000 mg | ORAL_TABLET | Freq: Every day | ORAL | 5 refills | Status: DC

## 2018-05-01 NOTE — Telephone Encounter (Signed)
Appointments scheduled AVS/Calendar printed per 6/24 los °

## 2018-05-08 ENCOUNTER — Ambulatory Visit (INDEPENDENT_AMBULATORY_CARE_PROVIDER_SITE_OTHER)

## 2018-05-08 ENCOUNTER — Encounter (INDEPENDENT_AMBULATORY_CARE_PROVIDER_SITE_OTHER): Payer: Self-pay | Admitting: Orthopedic Surgery

## 2018-05-08 ENCOUNTER — Ambulatory Visit (INDEPENDENT_AMBULATORY_CARE_PROVIDER_SITE_OTHER): Admitting: Orthopedic Surgery

## 2018-05-08 VITALS — Ht 64.0 in | Wt 222.0 lb

## 2018-05-08 DIAGNOSIS — M25572 Pain in left ankle and joints of left foot: Secondary | ICD-10-CM

## 2018-05-08 DIAGNOSIS — M6701 Short Achilles tendon (acquired), right ankle: Secondary | ICD-10-CM | POA: Diagnosis not present

## 2018-05-08 MED ORDER — LIDOCAINE HCL 1 % IJ SOLN
2.0000 mL | INTRAMUSCULAR | Status: AC | PRN
  Administered 2018-05-08: 2 mL

## 2018-05-08 MED ORDER — METHYLPREDNISOLONE ACETATE 40 MG/ML IJ SUSP
40.0000 mg | INTRAMUSCULAR | Status: AC | PRN
  Administered 2018-05-08: 40 mg

## 2018-05-08 NOTE — Progress Notes (Signed)
Office Visit Note   Patient: Meagan Ryan           Date of Birth: May 30, 1956           MRN: [id number] Visit Date: 05/08/2018              Requested by: Hulan Fess, MD [place], [place] [id number] PCP: Hulan Fess, MD  Chief Complaint  Patient presents with  . Left Ankle - Pain      HPI: Patient is a 62 year old woman who is status post recent multiple surgical interventions primarily for breast cancer.  Patient complains of global pain around the foot and ankle on the left side pain with start up in the morning over the origin the plantar fascia pain with sheets touching her foot.  Patient denies any specific injury states this is been going on for 5 days.  States that her foot has been warmer but denies any redness or cellulitis.  Assessment & Plan: Visit Diagnoses:  1. Pain in left ankle and joints of left foot   2. Achilles tendon contracture, right     Plan: The origin the plantar fascia was injected she tolerated this well we will draw a uric acid level, her recent stressful surgical interventions could lead to elevated uric acid level she was given instructions for Achilles stretching to do 5 times a day a minute at a time this was demonstrated to her.  Follow-Up Instructions: Return in about 1 month (around 06/05/2018).   Ortho Exam  Patient is alert, oriented, no adenopathy, well-dressed, normal affect, normal respiratory effort. Examination patient has a good dorsalis pedis pulse she has global tenderness around the ankle and foot to palpation however she is most tender to palpation the origin the plantar fascia.  She has an antalgic gait.  With her knee extended she has significant heel cord contracture with dorsiflexion about 20 degrees short of neutral.  There is no redness no cellulitis no signs of infection.  Imaging: Xr Ankle 2 Views Left  Result Date: 05/08/2018 2 view radiographs of the left ankle shows a Haglund's deformity as well as  calcification of the wound to the of the flexor muscles on the calcaneus.  AP radiograph shows a congruent ankle mortise.  No images are attached to the encounter.  Labs: No results found for: HGBA1C, ESRSEDRATE, CRP, LABURIC, REPTSTATUS, GRAMSTAIN, CULT, LABORGA   Lab Results  Component Value Date   ALBUMIN 3.4 (L) 09/30/2015   ALBUMIN 3.2 (L) 08/28/2015    Body mass index is 38.11 kg/m.  Orders:  Orders Placed This Encounter  Procedures  . Foot Inj  . XR Ankle 2 Views Left  . Uric acid   No orders of the defined types were placed in this encounter.    Procedures: Foot Inj Date/Time: 05/08/2018 4:45 PM Performed by: Newt Minion, MD Authorized by: Newt Minion, MD   Consent Given by:  Patient Site marked: the procedure site was marked   Timeout: prior to procedure the correct patient, procedure, and site was verified   Indications:  Fasciitis and pain Condition: Plantar Fasciitis   Location: right plantar fascia muscle   Prep: patient was prepped and draped in usual sterile fashion   Needle Size:  22 G Approach:  Dorsal Medications:  2 mL lidocaine 1 %; 40 mg methylPREDNISolone acetate 40 MG/ML Patient Tolerance:  Patient tolerated the procedure well with no immediate complications    Clinical Data: No additional findings.  ROS:  All other systems negative, except as noted in the HPI. Review of Systems  Objective: Vital Signs: Ht 5\' 4"  (1.626 m)   Wt 222 lb (100.7 kg)   BMI 38.11 kg/m   Specialty Comments:  No specialty comments available.  PMFS History: Patient Active Problem List   Diagnosis Date Noted  . History of left breast cancer 05/01/2018  . Genetic testing 03/13/2018  . Family history of breast cancer   . Malignant neoplasm of upper-outer quadrant of right breast in female, estrogen receptor positive ([place]) 02/23/2018  . Syncope 12/14/2017  . Pain and swelling of right lower leg 10/28/2016  . Right leg numbness 10/28/2016  .  Chronic systolic CHF (congestive heart failure), NYHA class 2 ([place]) 02/26/2016  . OSA (obstructive sleep apnea) 12/31/2015  . History of breast cancer in female 10/30/2015  . Ovarian cyst 10/10/2015  . Bilateral ovarian cysts 10/09/2015  . S/P oophorectomy 10/09/2015  . Morbid obesity with BMI of 40.0-44.9, adult ([place]) 08/08/2015  . Benign essential HTN 09/18/2014  . RBBB 09/18/2014  . Incomplete RBBB   . DCM (dilated cardiomyopathy) ([place]) 04/24/2014  . Meniere disease 06/02/2012   Past Medical History:  Diagnosis Date  . Allergic rhinitis   . Asthma   . Breast cancer ([place]) 1996   left breast-stage II infiltrating ductal carcinoma, 3/8 axillary lymph nodes were positive for metastatic carcinoma, getting yearly mammogram and every other year MRI followed by oncologist Dr Benay Spice  . CHF (congestive heart failure) ([place])   . Chronic combined systolic and diastolic heart failure ([place]) 02/26/2016  . Complication of anesthesia    WAKE UP WITH HEADACHE, difficulty waking up  . DCM (dilated cardiomyopathy) ([place])    EF normalized at 60-65% on echo 07/2016  . Dysuria 2011  . Family history of adverse reaction to anesthesia    "family wakes up with personality changes, but are very short term"  . Family history of breast cancer   . Fatty liver CT 1/12   elevated LFT  . Genetic testing 03/13/2018   Common cancers panel (47 genes) @ [facility] - No pathogenic mutations detected  . H/O actinic keratosis   . H/O allergic rhinitis   . H/O fibromyalgia   . H/O osteopenia 2009  . H/O peripheral neuropathy   . H/O varicose veins   . History of breast cancer   . History of IBS   . Hx of colonic polyps   . Hx of migraines   . Hx of ovarian cyst   . Hyperlipidemia   . Hypertension   . Incomplete RBBB   . 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 OSA (obstructive sleep apnea) 12/31/2015   Very mild with AHI 6/hr but elevated RDI at 32/hr.  . Pelvic pain in female   . PMB (postmenopausal  bleeding) 2011  . PONV (postoperative nausea and vomiting)   . Pregnancy induced hypertension   . Right-sided lacunar infarction ([place])    MRI 1.2012  . Tubular adenoma of colon     Family History  Problem Relation Age of Onset  . Heart disease Mother        MI  . Stroke Mother   . Diabetes Mother   . Hypertension Mother   . Stroke Father   . Prostate cancer Father 65       deceased 45  . Colon cancer Sister 24       currently 37  . Breast cancer Paternal  Aunt 70       deceased 24s  . Breast cancer Cousin        daughter of pat aunt w/ breast ca  . Breast cancer Cousin        daughter of pat aunt w/ breast ca    Past Surgical History:  Procedure Laterality Date  . gastroc muscle repair  2007  . LAPAROSCOPIC CHOLECYSTECTOMY    . MASTECTOMY  06/1995   Left - for infiltrating ductal carcinoma  . MASTECTOMY W/ SENTINEL NODE BIOPSY Right 03/31/2018  . MASTECTOMY W/ SENTINEL NODE BIOPSY Right 03/31/2018   Procedure: RIGHT TOTAL MASTECTOMY WITH AXILLARY SENTINEL LYMPH NODE BIOPSY ERAS PATHWAY;  Surgeon: Excell Seltzer, MD;  Location: [place];  Service: General;  Laterality: Right;  . 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 ROBOTIC ASSISTED TOTAL HYSTERECTOMY WITH BILATERAL SALPINGO OOPHERECTOMY Bilateral 10/09/2015   Procedure: ROBOTIC ASSISTED BILATERAL SALPINGO OOPHORECTOMY ;  Surgeon: Everitt Amber, MD;  Location: [facility];  Service: Gynecology;  Laterality: Bilateral;  . tram surgery  1996  . TUBAL LIGATION     Social History   Occupational History  . Not on file  Tobacco Use  . Smoking status: Never Smoker  . Smokeless tobacco: Never Used  Substance and Sexual Activity  . Alcohol use: No  . Drug use: No  . Sexual activity: Not on file

## 2018-05-09 LAB — URIC ACID: Uric Acid, Serum: 5.5 mg/dL (ref 2.5–7.0)

## 2018-05-31 ENCOUNTER — Other Ambulatory Visit: Payer: Self-pay | Admitting: Nurse Practitioner

## 2018-06-02 ENCOUNTER — Other Ambulatory Visit: Payer: Self-pay | Admitting: Emergency Medicine

## 2018-06-02 ENCOUNTER — Telehealth: Payer: Self-pay | Admitting: Emergency Medicine

## 2018-06-02 MED ORDER — ANASTROZOLE 1 MG PO TABS: 1.0000 mg | ORAL_TABLET | Freq: Every day | ORAL | 4 refills | Status: DC

## 2018-06-02 NOTE — Telephone Encounter (Signed)
Spoke to pt to let her know to stop taking the evista when she starts the arimidex per NP. Pt verbalized understanding.

## 2018-07-21 ENCOUNTER — Telehealth: Payer: Self-pay

## 2018-07-21 NOTE — Telephone Encounter (Signed)
Spoke with patient to remind of SCP visit with [facility] on 07/25/18 at 10 am.  Patient said she will come to appt.

## 2018-07-25 ENCOUNTER — Inpatient Hospital Stay: Attending: Hematology | Admitting: Adult Health

## 2018-07-25 ENCOUNTER — Telehealth: Payer: Self-pay | Admitting: Adult Health

## 2018-07-25 ENCOUNTER — Encounter: Payer: Self-pay | Admitting: Adult Health

## 2018-07-25 VITALS — BP 148/70 | HR 61 | Temp 97.5°F | Resp 18 | Ht 64.0 in | Wt 236.2 lb

## 2018-07-25 DIAGNOSIS — M2550 Pain in unspecified joint: Secondary | ICD-10-CM | POA: Diagnosis not present

## 2018-07-25 DIAGNOSIS — R5383 Other fatigue: Secondary | ICD-10-CM | POA: Diagnosis not present

## 2018-07-25 DIAGNOSIS — Z803 Family history of malignant neoplasm of breast: Secondary | ICD-10-CM | POA: Diagnosis not present

## 2018-07-25 DIAGNOSIS — R1011 Right upper quadrant pain: Secondary | ICD-10-CM | POA: Diagnosis not present

## 2018-07-25 DIAGNOSIS — Z79811 Long term (current) use of aromatase inhibitors: Secondary | ICD-10-CM | POA: Insufficient documentation

## 2018-07-25 DIAGNOSIS — C50411 Malignant neoplasm of upper-outer quadrant of right female breast: Secondary | ICD-10-CM | POA: Diagnosis present

## 2018-07-25 DIAGNOSIS — Z170 Estrogen receptor positive status [ER+]: Secondary | ICD-10-CM | POA: Diagnosis not present

## 2018-07-25 NOTE — Telephone Encounter (Signed)
Per 9/17 no los °

## 2018-07-25 NOTE — Progress Notes (Signed)
CLINIC:  Survivorship   REASON FOR VISIT:  Routine follow-up post-treatment for a recent history of breast cancer.  BRIEF ONCOLOGIC HISTORY:  Oncology History   Cancer Staging Malignant neoplasm of upper-outer quadrant of right breast in female, estrogen receptor positive ([name]) Staging form: Breast, AJCC 8th Edition - Clinical stage from 02/21/2018: Stage IA (cT1b, cN0, cM0, G1, ER+, PR+, HER2-) - Signed by Truitt Merle, MD on 03/01/2018       Malignant neoplasm of upper-outer quadrant of right breast in female, estrogen receptor positive ([name])   12/25/2015 Imaging    12/25/2015 Digital Screen Unilat R IMPRESSION: Further evaluation is suggested for possible distortion in the right breast.    01/02/2016 Imaging    01/02/2016 MM DIAG Breast TOMO Uni Right IMPRESSION: Further evaluation is suggested for possible distortion in the right breast.    01/12/2017 Imaging    01/12/2017 MM Digital Screening Unilat R IMPRESSION: No mammographic evidence of malignancy. A result letter of this screening mammogram will be mailed directly to the patient.    02/07/2018 Imaging    02/07/2018 MM 3D Screen Breast UNI Right IMPRESSION: Further evaluation is suggested for possible mass in the right breast.     02/16/2018 Mammogram    IMPRESSION: Irregular hypoechoic mass in the RIGHT breast at the 12 o'clock axis, retroareolar, measuring 7 mm, corresponding to the new mass seen on mammogram. This is a suspicious finding for which ultrasound-guided biopsy is recommended.    02/21/2018 Initial Biopsy    Diagnosis 02/21/18 Breast, right, needle core biopsy, 12 o'clock - INVASIVE DUCTAL CARCINOMA. - DUCTAL CARCINOMA IN SITU. - SEE COMMENT.    02/21/2018 Receptors her2    Prognostic indicators significant for: ER, 100% positive and PR, 100% positive or negative, both with strong staining intensity. Proliferation marker Ki67 at 5%. HER2 negative.    02/21/2018 Cancer Staging    Staging form:  Breast, AJCC 8th Edition - Clinical stage from 02/21/2018: Stage IA (cT1b, cN0, cM0, G1, ER+, PR+, HER2-) - Signed by Truitt Merle, MD on 03/01/2018    02/21/2018 Imaging    02/21/2018 [place] RT Breast BX W LOC DEV 1st Lesion IMG BX SPEC US Guide IMPRESSION: Ultrasound-guided biopsy of the RIGHT breast mass at the 12 o'clock axis. No apparent complications    7/94/8016 Initial Diagnosis    Malignant neoplasm of upper-outer quadrant of right breast in female, estrogen receptor positive ([name])    03/31/2018 Surgery    03/31/2018 Right total mastectomy 62 year old female with a new diagnosis of cancer of the right breast, upper outer quadrant. Clinical stage one a, ER positive, PR positive, HER-2 negative.  She has a remote history of left breast cancer treated with modified mastectomy and TRAM flap reconstruction in the 1990s.  After discussion of treatment options and risks extensively detailed elsewhere she has elected to proceed with right total mastectomy and axillary sentinel lymph node biopsy as initial surgical therapy.    03/31/2018 Pathology Results    03/31/2018 Diagnosis 1. Breast, simple mastectomy, Right Total - INVASIVE AND IN SITU DUCTAL CARCINOMA, 0.6 CM. - MARGINS NOT INVOLVED. - FIBROCYSTIC CHANGES. - BIOPSY SITE AND BIOPSY CLIP. 2. Lymph node, sentinel, biopsy, Right Axillary #1 - ONE BENIGN LYMPH NODE (0/1). 3. Lymph node, sentinel, biopsy, Right Axillary #2 - ONE BENIGN LYMPH NODE (0/1).     05/01/2018 -  Anti-estrogen oral therapy    Discontinued Evista. Anastrozole 1 mg daily      INTERVAL HISTORY:  Meagan Ryan  presents to the Survivorship Clinic today for our initial meeting to review her survivorship care plan detailing her treatment course for breast cancer, as well as monitoring long-term side effects of that treatment, education regarding health maintenance, screening, and overall wellness and health promotion.     Overall, Meagan Ryan reports feeling moderately well  today.  She started on Anastrozole in 04/2018.  Initially she was tolerating it well.  She says a positive about this medication is the fact that she hasn't experienced hot flashes.  However, she does note an increase in fatigue, and some achiness in her joints.  She notes that she is sleeping well at night.  She is not napping during the day.  She says she is sluggish in the morning.  She notes she does snore at night but not as bad as she used to.  She has neuropathy in her feet which makes it challenging for her to walk. She walks her dog, as much as she can.  She says she does this 10 minutes three times per day.  She denies starting any new medications.    She notes intermittent RUQ pain x 2 weeks.  She denies any specific pattern.  She notes she has had several surgeries in her RUQ including cholecystectomy, and also has undergone right nephrectomy.  She wonders if it could be scar tissue.     REVIEW OF SYSTEMS:  Review of Systems  Constitutional: Positive for fatigue. Negative for appetite change, chills, fever and unexpected weight change.  HENT:   Negative for hearing loss, lump/mass and trouble swallowing.   Eyes: Negative for eye problems and icterus.  Respiratory: Negative for chest tightness, cough and shortness of breath.   Cardiovascular: Negative for chest pain, leg swelling and palpitations.  Gastrointestinal: Negative for abdominal distention, abdominal pain, constipation, diarrhea, nausea and vomiting.  Endocrine: Negative for hot flashes.  Musculoskeletal: Positive for arthralgias.  Skin: Negative for itching and rash.  Neurological: Negative for dizziness, extremity weakness, headaches and numbness.  Hematological: Negative for adenopathy. Does not bruise/bleed easily.  Psychiatric/Behavioral: Negative for depression and sleep disturbance. The patient is not nervous/anxious.   Breast: Denies any new nodularity, masses, tenderness, nipple changes, or nipple discharge.       ONCOLOGY TREATMENT TEAM:  1. Surgeon:  Dr. Excell Seltzer at [facility] 2. Medical Oncologist: Dr. Burr Medico      PAST MEDICAL/SURGICAL HISTORY:  Past Medical History:  Diagnosis Date  . Allergic rhinitis   . Asthma   . Breast cancer ([place]) 1996   left breast-stage II infiltrating ductal carcinoma, 3/8 axillary lymph nodes were positive for metastatic carcinoma, getting yearly mammogram and every other year MRI followed by oncologist Dr Benay Spice  . CHF (congestive heart failure) ([place])   . Chronic combined systolic and diastolic heart failure ([place]) 02/26/2016  . Complication of anesthesia    WAKE UP WITH HEADACHE, difficulty waking up  . DCM (dilated cardiomyopathy) ([place])    EF normalized at 60-65% on echo 07/2016  . Dysuria 2011  . Family history of adverse reaction to anesthesia    "family wakes up with personality changes, but are very short term"  . Family history of breast cancer   . Fatty liver CT 1/12   elevated LFT  . Genetic testing 03/13/2018   Common cancers panel (47 genes) @ [facility] - No pathogenic mutations detected  . H/O actinic keratosis   . H/O allergic rhinitis   . H/O fibromyalgia   . H/O osteopenia 2009  .  H/O peripheral neuropathy   . H/O varicose veins   . History of breast cancer   . History of IBS   . Hx of colonic polyps   . Hx of migraines   . Hx of ovarian cyst   . Hyperlipidemia   . Hypertension   . Incomplete RBBB   . 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 OSA (obstructive sleep apnea) 12/31/2015   Very mild with AHI 6/hr but elevated RDI at 32/hr.  . Pelvic pain in female   . PMB (postmenopausal bleeding) 2011  . PONV (postoperative nausea and vomiting)   . Pregnancy induced hypertension   . Right-sided lacunar infarction ([place])    MRI 1.2012  . Tubular adenoma of colon    Past Surgical History:  Procedure Laterality Date  . gastroc muscle repair  2007  . LAPAROSCOPIC CHOLECYSTECTOMY    . MASTECTOMY  06/1995   Left - for  infiltrating ductal carcinoma  . MASTECTOMY W/ SENTINEL NODE BIOPSY Right 03/31/2018  . MASTECTOMY W/ SENTINEL NODE BIOPSY Right 03/31/2018   Procedure: RIGHT TOTAL MASTECTOMY WITH AXILLARY SENTINEL LYMPH NODE BIOPSY ERAS PATHWAY;  Surgeon: Excell Seltzer, MD;  Location: [facility];  Service: General;  Laterality: Right;  . 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 ROBOTIC ASSISTED TOTAL HYSTERECTOMY WITH BILATERAL SALPINGO OOPHERECTOMY Bilateral 10/09/2015   Procedure: ROBOTIC ASSISTED BILATERAL SALPINGO OOPHORECTOMY ;  Surgeon: Everitt Amber, MD;  Location: [facility];  Service: Gynecology;  Laterality: Bilateral;  . tram surgery  1996  . TUBAL LIGATION       ALLERGIES:  Allergies  Allergen Reactions  . Amlodipine Shortness Of Breath  . Noroxin [Norfloxacin] Shortness Of Breath  . Cephalexin Other (See Comments) and Nausea And Vomiting    Fever got to 105 High temperature  . Chocolate     Migraines   . Codeine Nausea And Vomiting  . Sulfa Antibiotics Nausea And Vomiting  . Latex Itching and Rash    redness  . Penicillins Rash    Has patient had a PCN reaction causing immediate rash, facial/tongue/throat swelling, SOB or lightheadedness with hypotension:No Has patient had a PCN reaction causing severe rash involving mucus membranes or skin necrosis: Yes Has patient had a PCN reaction that required hospitalization No Has patient had a PCN reaction occurring within the last 10 years: No If all of the above answers are "NO", then may proceed with Cephalosporin use.      CURRENT MEDICATIONS:  Outpatient Encounter Medications as of 07/25/2018  Medication Sig  . ALPRAZolam (XANAX) 0.5 MG tablet Take 0.25 mg by mouth 2 (two) times daily.   Marland Kitchen anastrozole (ARIMIDEX) 1 MG tablet Take 1 tablet (1 mg total) by mouth daily.  Marland Kitchen azelastine (ASTELIN) 0.1 % nasal spray Place 1 spray into both nostrils 2 (two) times daily as needed for rhinitis or allergies.   Marland Kitchen azithromycin  (ZITHROMAX) 500 MG tablet Take 1 tablet (500 mg total) by mouth daily.  . carvedilol (COREG) 3.125 MG tablet TAKE 1 TABLET TWICE A DAY WITH MEALS  . cetirizine (ZYRTEC) 10 MG tablet Take 10 mg by mouth every morning.   . CORLANOR 5 MG TABS tablet TAKE 1 TABLET TWICE A DAY WITH MEALS  . DULoxetine (CYMBALTA) 30 MG capsule Take 30 mg by mouth at bedtime.   Marland Kitchen ENTRESTO 24-26 MG TAKE 1 TABLET TWICE A DAY  . fluticasone (FLONASE) 50 MCG/ACT nasal spray Place 1 spray into  both nostrils every morning.   . gabapentin (NEURONTIN) 300 MG capsule Take 300 mg by mouth 2 (two) times daily.  Marland Kitchen ibuprofen (ADVIL,MOTRIN) 200 MG tablet Take 600 mg by mouth daily as needed for moderate pain.  . niacin 500 MG tablet Take 500 mg by mouth daily.  . nortriptyline (PAMELOR) 25 MG capsule Take 25 mg by mouth at bedtime as needed for sleep.   . Omega-3 Fatty Acids (FISH OIL) 1000 MG CAPS Take 2,000 mg by mouth 2 (two) times daily.  Marland Kitchen omeprazole (PRILOSEC) 20 MG capsule Take 20 mg by mouth daily.  . [DISCONTINUED] raloxifene (EVISTA) 60 MG tablet Take 60 mg by mouth every morning.    No facility-administered encounter medications on file as of 07/25/2018.      ONCOLOGIC FAMILY HISTORY:  Family History  Problem Relation Age of Onset  . Heart disease Mother        MI  . Stroke Mother   . Diabetes Mother   . Hypertension Mother   . Stroke Father   . Prostate cancer Father 61       deceased 15  . Colon cancer Sister 66       currently 67  . Breast cancer Paternal Aunt 59       deceased 45s  . Breast cancer Cousin        daughter of pat aunt w/ breast ca  . Breast cancer Cousin        daughter of pat aunt w/ breast ca     GENETIC COUNSELING/TESTING: +VUS, negative otherwise  SOCIAL HISTORY:  Social History   Socioeconomic History  . Marital status: Widowed    Spouse name: Not on file  . Number of children: Not on file  . Years of education: Not on file  . Highest education level: Not on file    Occupational History  . Not on file  Social Needs  . Financial resource strain: Not on file  . Food insecurity:    Worry: Not on file    Inability: Not on file  . Transportation needs:    Medical: Not on file    Non-medical: Not on file  Tobacco Use  . Smoking status: Never Smoker  . Smokeless tobacco: Never Used  Substance and Sexual Activity  . Alcohol use: No  . Drug use: No  . Sexual activity: Not on file  Lifestyle  . Physical activity:    Days per week: Not on file    Minutes per session: Not on file  . Stress: Not on file  Relationships  . Social connections:    Talks on phone: Not on file    Gets together: Not on file    Attends religious service: Not on file    Active member of club or organization: Not on file    Attends meetings of clubs or organizations: Not on file    Relationship status: Not on file  . Intimate partner violence:    Fear of current or ex partner: Not on file    Emotionally abused: Not on file    Physically abused: Not on file    Forced sexual activity: Not on file  Other Topics Concern  . 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 Wears Ear  plugs   Marital Status: Widowed   1 daughter     PHYSICAL EXAMINATION:  Vital Signs:   Vitals:   07/25/18 0957  BP: (!) 148/70  Pulse: 61  Resp: 18  Temp: (!) 97.5 F (36.4 C)  SpO2: 100%   Filed Weights   07/25/18 0957  Weight: 236 lb 3.2 oz (107.1 kg)   General: Well-nourished, well-appearing female in no acute distress.  She is unaccompanied today.   HEENT: Head is normocephalic.  Pupils equal and reactive to light. Conjunctivae clear without exudate.  Sclerae anicteric. Oral mucosa is pink, moist.  Oropharynx is pink without lesions or erythema.  Lymph: No cervical, supraclavicular, or  infraclavicular lymphadenopathy noted on palpation.  Cardiovascular: Regular rate and rhythm.Marland Kitchen Respiratory: Clear to auscultation bilaterally. Chest expansion symmetric; breathing non-labored.  Breasts: right breast s/p mastectomy, no nodules or masses noted, scar is healing well.  Left breast s/p mastectomy and reconstruction, no nodules, masses, skin or nipple changes GI: Abdomen soft and round; non-tender, non-distended. Bowel sounds normoactive.  GU: Deferred.  Neuro: No focal deficits. Steady gait.  Psych: Mood and affect normal and appropriate for situation.  Extremities: No edema. MSK: No focal spinal tenderness to palpation.  Full range of motion in bilateral upper extremities Skin: Warm and dry.  LABORATORY DATA:  None for this visit.  DIAGNOSTIC IMAGING:  None for this visit.      ASSESSMENT AND PLAN:  Meagan Ryan is a pleasant 62 y.o. female with Stage IA right breast invasive ductal carcinoma, ER+/PR+/HER2-, diagnosed in 02/2018, treated with mastectomy,  and anti-estrogen therapy with Anastrozole beginning in 04/2018.  She presents to the Survivorship Clinic for our initial meeting and routine follow-up post-completion of treatment for breast cancer.    1. Stage IA right breast cancer:  Meagan Ryan is continuing to recover from definitive treatment for breast cancer. She will follow-up with her medical oncologist, Dr. Burr Medico in 10/2018 with history and physical exam per surveillance protocol.  She will continue her anti-estrogen therapy with Anastrozole. Thus far, she is tolerating the Anastrozole well, with minimal side effects see #2. Today, a comprehensive survivorship care plan and treatment summary was reviewed with the patient today detailing her breast cancer diagnosis, treatment course, potential late/long-term effects of treatment, appropriate follow-up care with recommendations for the future, and patient education resources.  A copy of this summary, along with a letter will  be sent to the patient's primary care provider via mail/fax/In Basket message after today's visit.    2.  Fatigue and arthralgias: We reviewed things that can help her fatigue and arthralgias.  I recommended yoga, tai chi, and exercise.  She is also going to start taking Vitamin B12 and Turmeric.  She and I reviewed these and she will try them to see if they help.   3.  RUQ pain: She declined RUQ ultrasound.  She knows to call if she changes her mind and I would be happy to order it for her.    4. Bone health:  Given Meagan Ryan's age/history of breast cancer and her current treatment regimen including anti-estrogen therapy with Anastrozole, she is at risk for bone demineralization.  Her last DEXA scan was with Dr. Rex Kras, and she cannot recall the results.  We will request these from his office.  She was given education on specific activities to promote bone health.  5. Cancer screening:  Due to Meagan Ryan's history and her age, she should receive screening for skin cancers, colon cancer,  and gynecologic cancers.  The information and recommendations are listed on the patient's comprehensive care plan/treatment summary and were reviewed in detail with the patient.    6. Health maintenance and wellness promotion: Meagan Ryan was encouraged to consume 5-7 servings of fruits and vegetables per day. We reviewed the "Nutrition Rainbow" handout, as well as the handout "Take Control of Your Health and Reduce Your Cancer Risk" from the [facility].  She was also encouraged to engage in moderate to vigorous exercise for 30 minutes per day most days of the week. We discussed the [employer] fitness program, which is designed for cancer survivors to help them become more physically fit after cancer treatments.  She was instructed to limit her alcohol consumption and continue to abstain from tobacco use.     7. Support services/counseling: It is not uncommon for this period of the patient's cancer care  trajectory to be one of many emotions and stressors.  We discussed an opportunity for her to participate in the next session of [facility] ("Finding Your New Normal") support group series designed for patients after they have completed treatment.   Meagan Ryan was encouraged to take advantage of our many other support services programs, support groups, and/or counseling in coping with her new life as a cancer survivor after completing anti-cancer treatment.  She was given information regarding our available services and encouraged to contact me with any questions or for help enrolling in any of our support group/programs.    Dispo:   -Return to cancer center in 10/2018 for f/u with Dr. Burr Medico -Follow up with Dr. Excell Seltzer in 12/2017 -She is welcome to return back to the Survivorship Clinic at any time; no additional follow-up needed at this time.  -Consider referral back to survivorship as a long-term survivor for continued surveillance  A total of (30) minutes of face-to-face time was spent with this patient with greater than 50% of that time in counseling and care-coordination.   Gardenia Phlegm, [place] [phone]   Note: PRIMARY CARE PROVIDER Hulan Fess, [place] [phone]

## 2018-07-27 ENCOUNTER — Telehealth: Payer: Self-pay

## 2018-07-27 NOTE — Telephone Encounter (Signed)
Spoke with patient to inform her that NP had looked over her last BD and it was from 11/04/2014/Dr. Little/[facility].  NP recommends repeating BD either here or with Dr. Rex Kras.  Patient says she prefers Dr. Rex Kras since he already knows everything. T-score -1.8 AP spine T-score -1.3 Dual femur

## 2018-09-05 ENCOUNTER — Encounter: Payer: Self-pay | Admitting: Licensed Clinical Social Worker

## 2018-09-05 NOTE — Progress Notes (Signed)
Update: Variant of Uncertain Significance POLE [id number] ([id number]) has been reclassified to "Likely Benign." The report date is 09/02/2018.

## 2018-10-24 NOTE — Progress Notes (Signed)
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    01/02/2016 Imaging    01/02/2016 MM DIAG Breast TOMO Uni Right IMPRESSION: Further evaluation is suggested for possible distortion in the right breast.    01/12/2017 Imaging    01/12/2017 MM Digital Screening Unilat R IMPRESSION: No mammographic evidence of malignancy. A result letter of this screening mammogram will be mailed directly to the patient.    02/07/2018 Imaging    02/07/2018 MM 3D Screen Breast UNI Right IMPRESSION: Further evaluation is suggested for possible mass in the right breast.     02/16/2018 Mammogram    IMPRESSION: Irregular hypoechoic mass in the RIGHT breast at the 12 o'clock axis, retroareolar, measuring 7 mm, corresponding to the new mass seen on mammogram. This is a suspicious finding for which ultrasound-guided  biopsy is recommended.    02/21/2018 Initial Biopsy    Diagnosis 02/21/18 Breast, right, needle core biopsy, 12 o'clock - INVASIVE DUCTAL CARCINOMA. - DUCTAL CARCINOMA IN SITU. - SEE COMMENT.    02/21/2018 Receptors her2    Prognostic indicators significant for: ER, 100% positive and PR, 100% positive or negative, both with strong staining intensity. Proliferation marker Ki67 at 5%. HER2 negative.    02/21/2018 Cancer Staging    Staging form: Breast, AJCC 8th Edition - Clinical stage from 02/21/2018: Stage IA (cT1b, cN0, cM0, G1, ER+, PR+, HER2-) - Signed by Feng, Yan, MD on 03/01/2018    02/21/2018 Imaging    02/21/2018 US RT Breast BX W LOC DEV 1st Lesion IMG BX SPEC US Guide IMPRESSION: Ultrasound-guided biopsy of the RIGHT breast mass at the 12 o'clock axis. No apparent complications    02/23/2018 Initial Diagnosis    Malignant neoplasm of upper-outer quadrant of right breast in female, estrogen receptor positive (HCC)    03/31/2018 Surgery    03/31/2018 Right total mastectomy 61 year old female with a new diagnosis of cancer of the right breast, upper outer quadrant. Clinical stage one a, ER positive, PR positive, HER-2 negative.  She has a remote history of left breast cancer treated with modified mastectomy and TRAM flap reconstruction in the 1990s.  After discussion of treatment options and risks extensively detailed elsewhere she has elected to proceed with right total mastectomy and axillary sentinel lymph node biopsy as initial surgical therapy.    03/31/2018 Pathology Results    03/31/2018 Diagnosis 1. Breast, simple mastectomy, Right Total - INVASIVE AND IN SITU DUCTAL CARCINOMA, 0.6 CM. - MARGINS NOT INVOLVED. - FIBROCYSTIC CHANGES. -   BIOPSY SITE AND BIOPSY CLIP. 2. Lymph node, sentinel, biopsy, Right Axillary #1 - ONE BENIGN LYMPH NODE (0/1). 3. Lymph node, sentinel, biopsy, Right Axillary #2 - ONE BENIGN LYMPH NODE (0/1).     05/01/2018 -  Anti-estrogen oral therapy     Discontinued Evista. Anastrozole 1 mg daily      CURRENT THERAPY Anastrozole 70m daily, started 04/2018.   INTERVAL HISTORY: Meagan LINDOis a 62y.o. female who is here for follow-up. Today, she is here alone. She is doing well and is tolerating Anastrozole well. She is gaining weight and says that Cymbalta relieved her hot flashes. She tries to avoid carbs. 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 She denies other pain.    Pertinent positives and negatives of review of systems are listed and detailed within the above HPI.  REVIEW OF SYSTEMS:   Constitutional: Denies fevers, chills or abnormal weight loss (+) weight gain Eyes: Denies blurriness of vision Ears, nose, mouth, throat, and face: Denies mucositis or sore throat (+) Mnire disease  Respiratory: Denies cough, dyspnea or wheezes Cardiovascular: Denies palpitation, chest discomfort or lower extremity swelling Gastrointestinal:  Denies nausea, heartburn or change in bowel habits Skin: Denies abnormal skin rashes Lymphatics: Denies new lymphadenopathy or easy bruising Neurological: (+) LL neuropathy Behavioral/Psych: Mood is stable, no new changes  All other systems were reviewed with the patient and are negative.  MEDICAL HISTORY:  Past Medical History:  Diagnosis Date  . Allergic rhinitis   . Asthma   . [place]   left breast-stage II infiltrating ductal carcinoma, 3/8 axillary lymph nodes were positive for metastatic carcinoma, getting yearly mammogram and every other year MRI followed by oncologist Dr SBenay Spice . [place]   . Chronic combined systolic and diastolic heart failure ([place] 02/26/2016  . Complication of anesthesia    WAKE UP WITH HEADACHE, difficulty waking up  . DCM (dilated cardiomyopathy) ([place]    EF normalized at 60-65% on echo  07/2016  . Dysuria 2011  . Family history of adverse reaction to anesthesia    "family wakes up with personality changes, but are very short term"  . Family history of breast cancer   . Fatty liver CT 1/12   elevated LFT  . Genetic testing 03/13/2018   Common cancers panel (47 genes) @ [facility] - No pathogenic mutations detected  . H/O actinic keratosis   . H/O allergic rhinitis   . H/O fibromyalgia   . H/O osteopenia 2009  . H/O peripheral neuropathy   . H/O varicose veins   . History of breast cancer   . History of IBS   . Hx of colonic polyps   . Hx of migraines   . Hx of ovarian cyst   . Hyperlipidemia   . Hypertension   . Incomplete RBBB   . 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 OSA (obstructive sleep apnea) 12/31/2015   Very mild with AHI 6/hr but elevated RDI at 32/hr.  . Pelvic pain in female   . PMB (postmenopausal bleeding) 2011  . PONV (postoperative nausea and vomiting)   . Pregnancy induced hypertension   . Right[place]sided lacunar infarction ([place]    MRI 1.2012  . Tubular adenoma of colon     SURGICAL HISTORY: Past Surgical History:  Procedure Laterality  Date  . gastroc muscle repair  2007  . LAPAROSCOPIC CHOLECYSTECTOMY    . MASTECTOMY  06/1995   Left - for infiltrating ductal carcinoma  . MASTECTOMY W/ SENTINEL NODE BIOPSY Right 03/31/2018  . MASTECTOMY W/ SENTINEL NODE BIOPSY Right 03/31/2018   Procedure: RIGHT TOTAL MASTECTOMY WITH AXILLARY SENTINEL LYMPH NODE BIOPSY ERAS PATHWAY;  Surgeon: Hoxworth, Benjamin, MD;  Location: [facility] OR;  Service: General;  Laterality: Right;  . 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 ROBOTIC ASSISTED TOTAL HYSTERECTOMY WITH BILATERAL SALPINGO OOPHERECTOMY Bilateral 10/09/2015   Procedure: ROBOTIC ASSISTED BILATERAL SALPINGO OOPHORECTOMY ;  Surgeon: Emma Rossi, MD;  Location: [facility] ORS;  Service: Gynecology;  Laterality: Bilateral;  . tram surgery  1996  . TUBAL LIGATION      I have reviewed the social history  and family history with the patient and they are unchanged from previous note.  ALLERGIES:  is allergic to amlodipine; noroxin [norfloxacin]; cephalexin; chocolate; codeine; sulfa antibiotics; latex; and penicillins.  MEDICATIONS:  Current Outpatient Medications  Medication Sig Dispense Refill  . ALPRAZolam (XANAX) 0.5 MG tablet Take 0.25 mg by mouth 2 (two) times daily.     . anastrozole (ARIMIDEX) 1 MG tablet Take 1 tablet (1 mg total) by mouth daily. 90 tablet 4  . azelastine (ASTELIN) 0.1 % nasal spray Place 1 spray into both nostrils 2 (two) times daily as needed for rhinitis or allergies.     . azithromycin (ZITHROMAX) 500 MG tablet Take 1 tablet (500 mg total) by mouth daily. 5 tablet 0  . carvedilol (COREG) 3.125 MG tablet TAKE 1 TABLET TWICE A DAY WITH MEALS 180 tablet 3  . cetirizine (ZYRTEC) 10 MG tablet Take 10 mg by mouth every morning.     . CORLANOR 5 MG TABS tablet TAKE 1 TABLET TWICE A DAY WITH MEALS 180 tablet 3  . DULoxetine (CYMBALTA) 30 MG capsule Take 30 mg by mouth at bedtime.     . ENTRESTO 24-26 MG TAKE 1 TABLET TWICE A DAY 180 tablet 3  . fluticasone (FLONASE) 50 MCG/ACT nasal spray Place 1 spray into both nostrils every morning.     . gabapentin (NEURONTIN) 300 MG capsule Take 300 mg by mouth 2 (two) times daily.    . ibuprofen (ADVIL,MOTRIN) 200 MG tablet Take 600 mg by mouth daily as needed for moderate pain.    . niacin 500 MG tablet Take 500 mg by mouth daily.    . nortriptyline (PAMELOR) 25 MG capsule Take 25 mg by mouth at bedtime as needed for sleep.     . Omega-3 Fatty Acids (FISH OIL) 1000 MG CAPS Take 2,000 mg by mouth 2 (two) times daily.    . omeprazole (PRILOSEC) 20 MG capsule Take 20 mg by mouth daily.     No current facility-administered medications for this visit.     PHYSICAL EXAMINATION: ECOG PERFORMANCE STATUS: 0 - Asymptomatic  Vitals:   10/25/18 1530  BP: (!) 167/82  Pulse: 65  Resp: 18  Temp: 98 F (36.7 C)  SpO2: 99%   Filed  Weights   10/25/18 1530  Weight: 238 lb 1.6 oz (108 kg)    GENERAL:alert, no distress and comfortable SKIN: skin color, texture, turgor are normal, no rashes or significant lesions EYES: normal, Conjunctiva are pink and non-injected, sclera clear OROPHARYNX:no exudate, no erythema and lips, buccal mucosa, and tongue normal  NECK: supple, thyroid normal size, non-tender, without nodularity LYMPH:  no palpable lymphadenopathy in the   cervical, axillary or inguinal LUNGS: clear to auscultation and percussion with normal breathing effort HEART: regular rate & rhythm and no murmurs and no lower extremity edema ABDOMEN:abdomen soft, non-tender and normal bowel sounds Musculoskeletal:no cyanosis of digits and no clubbing  NEURO: alert & oriented x 3 with fluent speech, no focal motor/sensory deficits Breast: No new palpable masses, skin or nipple changes. (+) skin mole on left breast  LABORATORY DATA:  I have reviewed the data as listed CBC Latest Ref Rng & Units 10/25/2018 04/01/2018 03/24/2018  WBC 4.0 - 10.5 K/uL 5.5 13.6(H) 5.5  Hemoglobin 12.0 - 15.0 g/dL 13.0 11.4(L) 13.7  Hematocrit 36.0 - 46.0 % 40.5 34.7(L) 42.6  Platelets 150 - 400 K/uL 240 231 248     CMP Latest Ref Rng & Units 10/25/2018 04/01/2018 03/24/2018  Glucose 70 - 99 mg/dL 81 144(H) 93  BUN 8 - 23 mg/dL 10 12 11  Creatinine 0.44 - 1.00 mg/dL 1.04(H) 0.99 1.15(H)  Sodium 135 - 145 mmol/L 138 139 139  Potassium 3.5 - 5.1 mmol/L 4.4 4.9 4.2  Chloride 98 - 111 mmol/L 101 107 104  CO2 22 - 32 mmol/L 28 26 27  Calcium 8.9 - 10.3 mg/dL 10.1 8.9 9.2  Total Protein 6.5 - 8.1 g/dL 7.6 - -  Total Bilirubin 0.3 - 1.2 mg/dL 0.6 - -  Alkaline Phos 38 - 126 U/L 82 - -  AST 15 - 41 U/L 27 - -  ALT 0 - 44 U/L 21 - -      RADIOGRAPHIC STUDIES: I have personally reviewed the radiological images as listed and agreed with the findings in the report. No results found.   ASSESSMENT & PLAN:  Alyria C Pytel is a 62 y.o. female with  history of  1.  Malignant neoplasm of upper-outer quadrant of right breast, Invasive Ductal Carcinoma and DCIS, pT1bN0M0 stage IA, grade I, ER/PR postive, HER2 negative -Diagnosed in 12/2015. Treated with mastectomy. Currently on anastrozole 1 mg daily, started in 04/2018. Tolerating well. -She has attended survivorship clinic. -Labs reviewed, CBC is WNLs. CMP showed Cr 1.04.  -Her hot flashes improved on Cymbalta. She is gaining weight. We discussed weight loss options including dietary modifications and exercise. She is willing to try to lose weight. -I informed her that Anastrozole can contribute to weight gain and hight blood pressure. She denies history of HTN, but her BP today is 167/82. -I advised her to discuss a DEXA scan with Dr. Little. Will have one before next visit. -She is interested in a flu shot, will give it today  -f/u in 4 months  2. Previous left breast cancer in 1996.  -No evidence of recurrence. Currently on Anastrozole for right breast cancer.  3. Obesity with weight gain, OSA and dilated cardiomyopathy -f/u with PCP and cardiologist Dr. Turner. -She is gaining weight and upset about it. I discussed reducing carbohydrates in diet and exercise as tolerated to lose weight. I also discussed referring her to weight loss and wellness program.  -Her LL neuropathy and Menire disease limit her ability to exercise.  -I advised her to set a goal of a 5 Ib weight loss at a time. She agrees and is willing to try.   Plan  -DEXA with Dr. Little -flu shot today -f/u in 4 months with lab -continue anastrozole  -she will try to loose weight   No problem-specific Assessment & Plan notes found for this encounter.   No orders of the defined types were placed   in this encounter.  All questions were answered. The patient knows to call the clinic with any problems, questions or concerns. No barriers to learning was detected. I spent 15 minutes counseling the patient face to face.  The total time spent in the appointment was 20 minutes and more than 50% was on counseling and review of test results  I, Noor Dweik am acting as scribe for Dr. Truitt Merle.  I have reviewed the above documentation for accuracy and completeness, and I agree with the above.     Truitt Merle, MD 10/25/2018

## 2018-10-25 ENCOUNTER — Inpatient Hospital Stay (HOSPITAL_BASED_OUTPATIENT_CLINIC_OR_DEPARTMENT_OTHER): Admitting: Hematology

## 2018-10-25 ENCOUNTER — Encounter: Payer: Self-pay | Admitting: Hematology

## 2018-10-25 ENCOUNTER — Other Ambulatory Visit: Payer: Self-pay | Admitting: Hematology

## 2018-10-25 ENCOUNTER — Inpatient Hospital Stay: Attending: Hematology

## 2018-10-25 VITALS — BP 167/82 | HR 65 | Temp 98.0°F | Resp 18 | Ht 64.0 in | Wt 238.1 lb

## 2018-10-25 DIAGNOSIS — Z79811 Long term (current) use of aromatase inhibitors: Secondary | ICD-10-CM | POA: Insufficient documentation

## 2018-10-25 DIAGNOSIS — C50411 Malignant neoplasm of upper-outer quadrant of right female breast: Secondary | ICD-10-CM | POA: Insufficient documentation

## 2018-10-25 DIAGNOSIS — E669 Obesity, unspecified: Secondary | ICD-10-CM | POA: Diagnosis not present

## 2018-10-25 DIAGNOSIS — I10 Essential (primary) hypertension: Secondary | ICD-10-CM | POA: Insufficient documentation

## 2018-10-25 DIAGNOSIS — Z23 Encounter for immunization: Secondary | ICD-10-CM | POA: Diagnosis not present

## 2018-10-25 DIAGNOSIS — Z170 Estrogen receptor positive status [ER+]: Secondary | ICD-10-CM | POA: Insufficient documentation

## 2018-10-25 DIAGNOSIS — Z9011 Acquired absence of right breast and nipple: Secondary | ICD-10-CM

## 2018-10-25 DIAGNOSIS — Z79899 Other long term (current) drug therapy: Secondary | ICD-10-CM | POA: Diagnosis not present

## 2018-10-25 LAB — CBC WITH DIFFERENTIAL (CANCER CENTER ONLY)
Abs Immature Granulocytes: 0.01 10*3/uL (ref 0.00–0.07)
BASOS PCT: 1 %
Basophils Absolute: 0 10*3/uL (ref 0.0–0.1)
EOS PCT: 2 %
Eosinophils Absolute: 0.1 10*3/uL (ref 0.0–0.5)
HCT: 40.5 % (ref 36.0–46.0)
HEMOGLOBIN: 13 g/dL (ref 12.0–15.0)
Immature Granulocytes: 0 %
LYMPHS PCT: 48 %
Lymphs Abs: 2.6 10*3/uL (ref 0.7–4.0)
MCH: 27.8 pg (ref 26.0–34.0)
MCHC: 32.1 g/dL (ref 30.0–36.0)
MCV: 86.5 fL (ref 80.0–100.0)
MONO ABS: 0.6 10*3/uL (ref 0.1–1.0)
Monocytes Relative: 11 %
Neutro Abs: 2.1 10*3/uL (ref 1.7–7.7)
Neutrophils Relative %: 38 %
Platelet Count: 240 10*3/uL (ref 150–400)
RBC: 4.68 MIL/uL (ref 3.87–5.11)
RDW: 15.1 % (ref 11.5–15.5)
WBC Count: 5.5 10*3/uL (ref 4.0–10.5)
nRBC: 0 % (ref 0.0–0.2)

## 2018-10-25 LAB — CMP (CANCER CENTER ONLY)
ALT: 21 U/L (ref 0–44)
ANION GAP: 9 (ref 5–15)
AST: 27 U/L (ref 15–41)
Albumin: 3.5 g/dL (ref 3.5–5.0)
Alkaline Phosphatase: 82 U/L (ref 38–126)
BUN: 10 mg/dL (ref 8–23)
CO2: 28 mmol/L (ref 22–32)
Calcium: 10.1 mg/dL (ref 8.9–10.3)
Chloride: 101 mmol/L (ref 98–111)
Creatinine: 1.04 mg/dL — ABNORMAL HIGH (ref 0.44–1.00)
GFR, EST NON AFRICAN AMERICAN: 57 mL/min — AB (ref >60)
Glucose, Bld: 81 mg/dL (ref 70–99)
Potassium: 4.4 mmol/L (ref 3.5–5.1)
SODIUM: 138 mmol/L (ref 135–145)
TOTAL PROTEIN: 7.6 g/dL (ref 6.5–8.1)
Total Bilirubin: 0.6 mg/dL (ref 0.3–1.2)

## 2018-10-25 MED ORDER — INFLUENZA VAC SPLIT QUAD 0.5 ML IM SUSY
0.5000 mL | PREFILLED_SYRINGE | Freq: Once | INTRAMUSCULAR | Status: AC
  Administered 2018-10-25: 0.5 mL via INTRAMUSCULAR

## 2018-10-25 MED ORDER — INFLUENZA VAC SPLIT QUAD 0.5 ML IM SUSY
PREFILLED_SYRINGE | INTRAMUSCULAR | Status: AC
  Filled 2018-10-25: qty 0.5

## 2018-10-27 ENCOUNTER — Telehealth: Payer: Self-pay | Admitting: Hematology

## 2018-10-27 NOTE — Telephone Encounter (Signed)
Called patient was not able to leave a voice message, phone kept ringing and ringing.  Printed and mailed calendar for April.

## 2019-02-22 NOTE — Progress Notes (Signed)
[place]   [phone]) [phone] [phone]) [phone]   Clinic Follow up Note   Patient Care Team: Hulan Fess, MD as PCP - General (Family Medicine) Sueanne Margarita, MD as Consulting Physician (Cardiology) Gery Pray, MD as Consulting Physician (Radiation Oncology) Excell Seltzer, MD as Consulting Physician (General Surgery) Truitt Merle, MD as Consulting Physician (Hematology) Delice Bison Charlestine Massed, NP as Nurse Practitioner (Hematology and Oncology)   I connected with Sunny Schlein on 02/26/2019 at  2:30 PM EDT by telephone visit and verified that I am speaking with the correct person using two identifiers.  I discussed the limitations, risks, security and privacy concerns of performing an evaluation and management service by telephone and the availability of in person appointments. I also discussed with the patient that there may be a patient responsible charge related to this service. The patient expressed understanding and agreed to proceed.   Patient's location:  Her home  Provider's location:  My office  CHIEF COMPLAINT: F/u of right breast cancer  SUMMARY OF ONCOLOGIC HISTORY: Oncology History   Cancer Staging Malignant neoplasm of upper-outer quadrant of right breast in female, estrogen receptor positive ([place]) Staging form: Breast, AJCC 8th Edition - Clinical stage from 02/21/2018: Stage IA (cT1b, cN0, cM0, G1, ER+, PR+, HER2-) - Signed by Truitt Merle, MD on 03/01/2018       Malignant neoplasm of upper-outer quadrant of right breast in female, estrogen receptor positive ([place])   12/25/2015 Imaging    12/25/2015 Digital Screen Unilat R IMPRESSION: Further evaluation is suggested for possible distortion in the right breast.    01/02/2016 Imaging    01/02/2016 MM DIAG Breast TOMO Uni Right IMPRESSION: Further evaluation is suggested for possible distortion in the right breast.    01/12/2017 Imaging    01/12/2017 MM Digital Screening Unilat R  IMPRESSION: No mammographic evidence of malignancy. A result letter of this screening mammogram will be mailed directly to the patient.    02/07/2018 Imaging    02/07/2018 MM 3D Screen Breast UNI Right IMPRESSION: Further evaluation is suggested for possible mass in the right breast.     02/16/2018 Mammogram    IMPRESSION: Irregular hypoechoic mass in the RIGHT breast at the 12 o'clock axis, retroareolar, measuring 7 mm, corresponding to the new mass seen on mammogram. This is a suspicious finding for which ultrasound-guided biopsy is recommended.    02/21/2018 Initial Biopsy    Diagnosis 02/21/18 Breast, right, needle core biopsy, 12 o'clock - INVASIVE DUCTAL CARCINOMA. - DUCTAL CARCINOMA IN SITU. - SEE COMMENT.    02/21/2018 Receptors her2    Prognostic indicators significant for: ER, 100% positive and PR, 100% positive or negative, both with strong staining intensity. Proliferation marker Ki67 at 5%. HER2 negative.    02/21/2018 Cancer Staging    Staging form: Breast, AJCC 8th Edition - Clinical stage from 02/21/2018: Stage IA (cT1b, cN0, cM0, G1, ER+, PR+, HER2-) - Signed by Truitt Merle, MD on 03/01/2018    02/21/2018 Imaging    02/21/2018 [place] RT Breast BX W LOC DEV 1st Lesion IMG BX SPEC US Guide IMPRESSION: Ultrasound-guided biopsy of the RIGHT breast mass at the 12 o'clock axis. No apparent complications    1/54/0086 Initial Diagnosis    Malignant neoplasm of upper-outer quadrant of right breast in female, estrogen receptor positive ([place])    03/31/2018 Surgery    03/31/2018 Right total mastectomy 63 year old female with a new diagnosis of cancer of the right breast, upper outer quadrant. Clinical stage  one a, ER positive, PR positive, HER-2 negative.  She has a remote history of left breast cancer treated with modified mastectomy and TRAM flap reconstruction in the 1990s.  After discussion of treatment options and risks extensively detailed elsewhere she has elected to proceed  with right total mastectomy and axillary sentinel lymph node biopsy as initial surgical therapy.    03/31/2018 Pathology Results    03/31/2018 Diagnosis 1. Breast, simple mastectomy, Right Total - INVASIVE AND IN SITU DUCTAL CARCINOMA, 0.6 CM. - MARGINS NOT INVOLVED. - FIBROCYSTIC CHANGES. - BIOPSY SITE AND BIOPSY CLIP. 2. Lymph node, sentinel, biopsy, Right Axillary #1 - ONE BENIGN LYMPH NODE (0/1). 3. Lymph node, sentinel, biopsy, Right Axillary #2 - ONE BENIGN LYMPH NODE (0/1).     05/01/2018 -  Anti-estrogen oral therapy    Discontinued Evista. Anastrozole 1 mg daily       CURRENT THERAPY:  Anastrozole 51m daily, started 04/2018  INTERVAL HISTORY:  Meagan SHERRINis here for a follow up of right breast cancer. She was able to identify herself by birth date. She notes she is doing well and in the last 4 months she had gained 4-5 pounds. She notes she watches her appetite and drinks plenty of water. She notes she feels tired most of the time and has reduced her carbohydrate intake. She otherwise tolerates anastrozole. She denies joint pain.  She is still on Cymbalta for her neuropathy. She continues to see her Cardiologist and will f/u next month.    REVIEW OF SYSTEMS:   Constitutional: Denies fevers, chills (+) continued weight gain Eyes: Denies blurriness of vision Ears, nose, mouth, throat, and face: Denies mucositis or sore throat Respiratory: Denies cough, dyspnea or wheezes Cardiovascular: Denies palpitation, chest discomfort or lower extremity swelling Gastrointestinal:  Denies nausea, heartburn or change in bowel habits Skin: Denies abnormal skin rashes Lymphatics: Denies new lymphadenopathy or easy bruising Neurological:Denies numbness, tingling or new weaknesses Behavioral/Psych: Mood is stable, no new changes  All other systems were reviewed with the patient and are negative.  MEDICAL HISTORY:  Past Medical History:  Diagnosis Date  . Allergic rhinitis   .  Asthma   . Breast cancer ([facility] 1996   left breast-stage II infiltrating ductal carcinoma, 3/8 axillary lymph nodes were positive for metastatic carcinoma, getting yearly mammogram and every other year MRI followed by oncologist Dr SBenay Spice . CHF (congestive heart failure) ([facility]   . Chronic combined systolic and diastolic heart failure ([facility] 02/26/2016  . Complication of anesthesia    WAKE UP WITH HEADACHE, difficulty waking up  . DCM (dilated cardiomyopathy) ([facility]    EF normalized at 60-65% on echo 07/2016  . Dysuria 2011  . Family history of adverse reaction to anesthesia    "family wakes up with personality changes, but are very short term"  . Family history of breast cancer   . Fatty liver CT 1/12   elevated LFT  . Genetic testing 03/13/2018   Common cancers panel (47 genes) @ [facility] - No pathogenic mutations detected  . H/O actinic keratosis   . H/O allergic rhinitis   . H/O fibromyalgia   . H/O osteopenia 2009  . H/O peripheral neuropathy   . H/O varicose veins   . History of breast cancer   . History of IBS   . Hx of colonic polyps   . Hx of migraines   . Hx of ovarian cyst   . Hyperlipidemia   . Hypertension   . Incomplete RBBB   .  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 OSA (obstructive sleep apnea) 12/31/2015   Very mild with AHI 6/hr but elevated RDI at 32/hr.  . Pelvic pain in female   . PMB (postmenopausal bleeding) 2011  . PONV (postoperative nausea and vomiting)   . Pregnancy induced hypertension   . Right-sided lacunar infarction ([place])    MRI 1.2012  . Tubular adenoma of colon     SURGICAL HISTORY: Past Surgical History:  Procedure Laterality Date  . gastroc muscle repair  2007  . LAPAROSCOPIC CHOLECYSTECTOMY    . MASTECTOMY  06/1995   Left - for infiltrating ductal carcinoma  . MASTECTOMY W/ SENTINEL NODE BIOPSY Right 03/31/2018  . MASTECTOMY W/ SENTINEL NODE BIOPSY Right 03/31/2018   Procedure: RIGHT TOTAL MASTECTOMY WITH AXILLARY SENTINEL LYMPH  NODE BIOPSY ERAS PATHWAY;  Surgeon: Excell Seltzer, MD;  Location: [facility];  Service: General;  Laterality: Right;  . 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 ROBOTIC ASSISTED TOTAL HYSTERECTOMY WITH BILATERAL SALPINGO OOPHERECTOMY Bilateral 10/09/2015   Procedure: ROBOTIC ASSISTED BILATERAL SALPINGO OOPHORECTOMY ;  Surgeon: Everitt Amber, MD;  Location: [facility];  Service: Gynecology;  Laterality: Bilateral;  . tram surgery  1996  . TUBAL LIGATION      I have reviewed the social history and family history with the patient and they are unchanged from previous note.  ALLERGIES:  is allergic to amlodipine; noroxin [norfloxacin]; cephalexin; chocolate; codeine; sulfa antibiotics; latex; and penicillins.  MEDICATIONS:  Current Outpatient Medications  Medication Sig Dispense Refill  . ALPRAZolam (XANAX) 0.5 MG tablet Take 0.25 mg by mouth 2 (two) times daily.     Marland Kitchen anastrozole (ARIMIDEX) 1 MG tablet Take 1 tablet (1 mg total) by mouth daily. 90 tablet 4  . azelastine (ASTELIN) 0.1 % nasal spray Place 1 spray into both nostrils 2 (two) times daily as needed for rhinitis or allergies.     Marland Kitchen azithromycin (ZITHROMAX) 500 MG tablet Take 1 tablet (500 mg total) by mouth daily. 5 tablet 0  . carvedilol (COREG) 3.125 MG tablet TAKE 1 TABLET TWICE A DAY WITH MEALS 180 tablet 3  . cetirizine (ZYRTEC) 10 MG tablet Take 10 mg by mouth every morning.     Steffanie Dunn 5 MG TABS tablet TAKE 1 TABLET TWICE A DAY WITH MEALS 180 tablet 3  . DULoxetine (CYMBALTA) 30 MG capsule Take 30 mg by mouth at bedtime.     Marland Kitchen ENTRESTO 24-26 MG TAKE 1 TABLET TWICE A DAY 180 tablet 3  . fluticasone (FLONASE) 50 MCG/ACT nasal spray Place 1 spray into both nostrils every morning.     . gabapentin (NEURONTIN) 300 MG capsule Take 300 mg by mouth 2 (two) times daily.    Marland Kitchen ibuprofen (ADVIL,MOTRIN) 200 MG tablet Take 600 mg by mouth daily as needed for moderate pain.    . niacin 500 MG tablet Take 500 mg by mouth daily.     . nortriptyline (PAMELOR) 25 MG capsule Take 25 mg by mouth at bedtime as needed for sleep.     . Omega-3 Fatty Acids (FISH OIL) 1000 MG CAPS Take 2,000 mg by mouth 2 (two) times daily.    Marland Kitchen omeprazole (PRILOSEC) 20 MG capsule Take 20 mg by mouth daily.     No current facility-administered medications for this visit.     PHYSICAL EXAMINATION: ECOG PERFORMANCE STATUS: 0 - Asymptomatic  ** No vitals taken today, Exam not performed today **  LABORATORY DATA:  I have reviewed the data as listed CBC Latest Ref Rng & Units 10/25/2018 04/01/2018 03/24/2018  WBC 4.0 - 10.5 K/uL 5.5 13.6(H) 5.5  Hemoglobin 12.0 - 15.0 g/dL 13.0 11.4(L) 13.7  Hematocrit 36.0 - 46.0 % 40.5 34.7(L) 42.6  Platelets 150 - 400 K/uL 240 231 248     CMP Latest Ref Rng & Units 10/25/2018 04/01/2018 03/24/2018  Glucose 70 - 99 mg/dL 81 144(H) 93  BUN 8 - 23 mg/dL 10 12 11   Creatinine 0.44 - 1.00 mg/dL 1.04(H) 0.99 1.15(H)  Sodium 135 - 145 mmol/L 138 139 139  Potassium 3.5 - 5.1 mmol/L 4.4 4.9 4.2  Chloride 98 - 111 mmol/L 101 107 104  CO2 22 - 32 mmol/L 28 26 27   Calcium 8.9 - 10.3 mg/dL 10.1 8.9 9.2  Total Protein 6.5 - 8.1 g/dL 7.6 - -  Total Bilirubin 0.3 - 1.2 mg/dL 0.6 - -  Alkaline Phos 38 - 126 U/L 82 - -  AST 15 - 41 U/L 27 - -  ALT 0 - 44 U/L 21 - -      RADIOGRAPHIC STUDIES: I have personally reviewed the radiological images as listed and agreed with the findings in the report. No results found.   ASSESSMENT & PLAN:  Meagan Ryan is a 63 y.o. female with   1. Malignant neoplasm of upper-outer quadrant of right breast, Invasive Ductal Carcinoma and DCIS, pT1bN0M0 stage IA, grade I, ER/PR positive, HER2 negative -Diagnosed in 12/2015. Treated with right mastectomy. Currently on anastrozole 1 mg daily, started in 04/2018. Tolerating well. -She has attended survivorship clinic previously  -She is clinically doing well, stable. Her main concerns is fatigue and weight gain. She has gained another 5  pounds. We discussed weight management.  -She is s/p b/l mastectomy and does not need f/u mammograms. There is no clinical concern for recurrence -She will f/u with PCP for DEXA scan, she is overdue  -Continue anastrozole  -Lab and f/u in 3-4 months .   2. Previous left breast cancer in 1996, s/p left mastectomy. -No evidence of recurrence. Currently on Anastrozole for right breast cancer.  3. Obesity with weight gain, OSA and dilated cardiomyopathy  -f/u with PCP and cardiologist Dr. Radford Pax. -She is gaining weight and upset about it. I discussed reducing carbohydrates in diet and exercise as tolerated to lose weight. I also discussed referring her to weight loss and wellness program.  -Her LL neuropathy and Menire disease limit her ability to exercise.  -Per pt she continues to gain another 5 pounds since last visit.  -I strongly encouraged her to cut carbohydrates in her diet and increase in protein with less red meat and more chicken.    Plan  -Continue Anastrozole  -Lab and f/u in 3-4 months  -DEXA with Dr. Rex Kras on next visit  -she will try to loose weight  No problem-specific Assessment & Plan notes found for this encounter.   No orders of the defined types were placed in this encounter.  I discussed the assessment and treatment plan with the patient. The patient was provided an opportunity to ask questions and all were answered. The patient agreed with the plan and demonstrated an understanding of the instructions.  The patient was advised to call back or seek an in-person evaluation if the symptoms worsen or if the condition fails to improve as anticipated.   I provided 10 minutes of non face-to-face telephone visit time during this encounter, and > 50%  was spent counseling as documented under my assessment & plan.    Truitt Merle, MD 02/26/2019   I, Joslyn Devon, am acting as scribe for Truitt Merle, MD.   I have reviewed the above documentation for accuracy and  completeness, and I agree with the above.

## 2019-02-23 ENCOUNTER — Telehealth: Payer: Self-pay | Admitting: Hematology

## 2019-02-23 NOTE — Telephone Encounter (Signed)
Called patient regarding upcoming Webex appointment, patient does not have access and would prefer 04/20 meeting to be a telephone visit.

## 2019-02-26 ENCOUNTER — Telehealth: Payer: Self-pay | Admitting: Hematology

## 2019-02-26 ENCOUNTER — Inpatient Hospital Stay: Attending: Hematology | Admitting: Hematology

## 2019-02-26 ENCOUNTER — Encounter: Payer: Self-pay | Admitting: Hematology

## 2019-02-26 DIAGNOSIS — C50411 Malignant neoplasm of upper-outer quadrant of right female breast: Secondary | ICD-10-CM

## 2019-02-26 DIAGNOSIS — Z170 Estrogen receptor positive status [ER+]: Secondary | ICD-10-CM

## 2019-02-26 DIAGNOSIS — Z79811 Long term (current) use of aromatase inhibitors: Secondary | ICD-10-CM | POA: Diagnosis not present

## 2019-02-26 NOTE — Telephone Encounter (Signed)
Scheduled appt per 4/20 los. °

## 2019-03-05 ENCOUNTER — Other Ambulatory Visit (INDEPENDENT_AMBULATORY_CARE_PROVIDER_SITE_OTHER): Payer: Self-pay

## 2019-03-05 MED ORDER — AMOXICILLIN-POT CLAVULANATE 875-125 MG PO TABS: ORAL_TABLET | ORAL | 0 refills | Status: DC

## 2019-03-23 ENCOUNTER — Other Ambulatory Visit: Payer: Self-pay | Admitting: Cardiology

## 2019-03-26 ENCOUNTER — Other Ambulatory Visit: Payer: Self-pay | Admitting: Cardiology

## 2019-03-26 ENCOUNTER — Telehealth: Payer: Self-pay

## 2019-03-26 NOTE — Telephone Encounter (Signed)

## 2019-03-29 ENCOUNTER — Encounter: Payer: Self-pay | Admitting: Cardiology

## 2019-03-29 NOTE — Progress Notes (Signed)
Virtual Visit via Telephone Note   This visit type was conducted due to national recommendations for restrictions regarding the COVID-19 Pandemic (e.g. social distancing) in an effort to limit this patient's exposure and mitigate transmission in our community.  Due to her co-morbid illnesses, this patient is at least at moderate risk for complications without adequate follow up.  This format is felt to be most appropriate for this patient at this time.  The patient did not have access to video technology/had technical difficulties with video requiring transitioning to audio format only (telephone).  All issues noted in this document were discussed and addressed.  No physical exam could be performed with this format.  Please refer to the patient's chart for her  consent to telehealth for [facility].   Evaluation Performed:  Follow-up visit  This visit type was conducted due to national recommendations for restrictions regarding the COVID-19 Pandemic (e.g. social distancing).  This format is felt to be most appropriate for this patient at this time.  All issues noted in this document were discussed and addressed.  No physical exam was performed (except for noted visual exam findings with Video Visits).  Please refer to the patient's chart (MyChart message for video visits and phone note for telephone visits) for the patient's consent to telehealth for [facility].  Date:  03/30/2019   ID:  Meagan Ryan, DOB 07-Nov-1956, MRN [id number]  Patient Location:  [facility]  Provider location:   [place]  PCP:  Hulan Fess, MD  Cardiologist:  Fransico Him, MD Electrophysiologist:  None   Chief Complaint:  DCM, HTN, CHF  History of Present Illness:    Meagan Ryan is a 63 y.o. female who presents via audio/video conferencing for a telehealth visit today.    Meagan Ryan is a 63 y.o. 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  She has h/o breast cancer with  recurrence.  Her cardiomyopathy was diagnosed in October 2016.  Echo at that time showed reduced EF down to 30 to 35%.  Subsequently, she underwent further ischemic evaluation with a coronary CTA with morphology done on September 04, 2015.  This showed a coronary calcium score of 0.  Normal coronary origin with right dominance.  There was no evidence of CAD, making the diagnosis of nonischemic cardiomyopathy.  She was placed on medical therapy. She has been on a BB and Entresto along with Corlanor. She had a repeat echocardiogram December 14, 2017 which showed improvement in EF to 50 to 55%.  Grade 2 diastolic dysfunction was also noted.  Her valvular function was normal.  She was last seen by Lyda Jester, PA for preoperative cardiac clearance for left mastectomy for breast CA and was doing well.  She is here today for followup and is doing well.  She denies any chest pain or pressure,  PND, orthopnea, LE edema,  palpitations or syncope. She has chronic dizziness from vertigo.  She has chronic DOE which she attributes to significant weight gain from the Anastrozole.  She has gained about 40lbs.  Her main compliant is chronic fatigue. She is compliant with her meds and is tolerating meds with no SE.    The patient does not have symptoms concerning for COVID-19 infection (fever, chills, cough, or new shortness of breath).   Prior CV studies:   The following studies were reviewed today:  none  Past Medical History:  Diagnosis Date  . Allergic rhinitis   . Asthma   .  Breast cancer ([place]) 1996   left breast-stage II infiltrating ductal carcinoma, 3/8 axillary lymph nodes were positive for metastatic carcinoma, getting yearly mammogram and every other year MRI followed by oncologist Dr Benay Spice  . Chronic diastolic CHF (congestive heart failure), NYHA class 2 ([place]) 02/26/2016  . Complication of anesthesia    WAKE UP WITH HEADACHE, difficulty waking up  . DCM (dilated cardiomyopathy) ([place])    EF  normalized at 60-65% on echo 07/2016  . Dysuria 2011  . Family history of adverse reaction to anesthesia    "family wakes up with personality changes, but are very short term"  . Family history of breast cancer   . Fatty liver CT 1/12   elevated LFT  . Genetic testing 03/13/2018   Common cancers panel (47 genes) @ [facility] - No pathogenic mutations detected  . H/O actinic keratosis   . H/O allergic rhinitis   . H/O fibromyalgia   . H/O osteopenia 2009  . H/O peripheral neuropathy   . H/O varicose veins   . History of breast cancer   . History of IBS   . Hx of colonic polyps   . Hx of migraines   . Hx of ovarian cyst   . Hyperlipidemia   . Hypertension   . Incomplete RBBB   . 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 OSA (obstructive sleep apnea) 12/31/2015   Very mild with AHI 6/hr but elevated RDI at 32/hr.  . Pelvic pain in female   . PMB (postmenopausal bleeding) 2011  . PONV (postoperative nausea and vomiting)   . Pregnancy induced hypertension   . Right-sided lacunar infarction ([place])    MRI 1.2012  . Tubular adenoma of colon    Past Surgical History:  Procedure Laterality Date  . gastroc muscle repair  2007  . LAPAROSCOPIC CHOLECYSTECTOMY    . MASTECTOMY  06/1995   Left - for infiltrating ductal carcinoma  . MASTECTOMY W/ SENTINEL NODE BIOPSY Right 03/31/2018  . MASTECTOMY W/ SENTINEL NODE BIOPSY Right 03/31/2018   Procedure: RIGHT TOTAL MASTECTOMY WITH AXILLARY SENTINEL LYMPH NODE BIOPSY ERAS PATHWAY;  Surgeon: Excell Seltzer, MD;  Location: [place];  Service: General;  Laterality: Right;  . 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 ROBOTIC ASSISTED TOTAL HYSTERECTOMY WITH BILATERAL SALPINGO OOPHERECTOMY Bilateral 10/09/2015   Procedure: ROBOTIC ASSISTED BILATERAL SALPINGO OOPHORECTOMY ;  Surgeon: Everitt Amber, MD;  Location: [facility];  Service: Gynecology;  Laterality: Bilateral;  . tram surgery  1996  . TUBAL LIGATION       Current Meds  Medication  Sig  . ALPRAZolam (XANAX) 0.5 MG tablet Take 0.5 mg by mouth 2 (two) times daily.   Marland Kitchen anastrozole (ARIMIDEX) 1 MG tablet Take 1 tablet (1 mg total) by mouth daily.  Marland Kitchen azelastine (ASTELIN) 0.1 % nasal spray Place 1 spray into both nostrils 2 (two) times daily as needed for rhinitis or allergies.   . carvedilol (COREG) 3.125 MG tablet Take 1 tablet (3.125 mg total) by mouth 2 (two) times daily with a meal. Please keep upcoming appt with Dr. Radford Pax. Thank you  . cetirizine (ZYRTEC) 10 MG tablet Take 10 mg by mouth every morning.   . cholecalciferol (VITAMIN D3) 25 MCG (1000 UT) tablet Take 2,000 Units by mouth daily.  . CORLANOR 5 MG TABS tablet TAKE 1 TABLET TWICE A DAY WITH MEALS  . DULoxetine (CYMBALTA) 30 MG capsule Take 30 mg by mouth at bedtime.   Marland Kitchen  ENTRESTO 24-26 MG TAKE 1 TABLET TWICE A DAY  . fluticasone (FLONASE) 50 MCG/ACT nasal spray Place 1 spray into both nostrils every morning.   . gabapentin (NEURONTIN) 300 MG capsule Take 300 mg by mouth 2 (two) times daily.  Marland Kitchen ibuprofen (ADVIL,MOTRIN) 200 MG tablet Take 600 mg by mouth daily as needed for moderate pain.  . Multiple Vitamins-Minerals (AIRBORNE GUMMIES) CHEW Chew 2 tablets by mouth daily.  . niacin 500 MG tablet Take 500 mg by mouth daily.  . nortriptyline (PAMELOR) 25 MG capsule Take 25 mg by mouth at bedtime as needed for sleep.   . Omega-3 Fatty Acids (FISH OIL) 1000 MG CAPS Take 2,000 mg by mouth 2 (two) times daily.  Marland Kitchen omeprazole (PRILOSEC) 20 MG capsule Take 20 mg by mouth daily.  . potassium chloride (K-DUR) 10 MEQ tablet Take 10 mEq by mouth daily.     Allergies:   Amlodipine; Noroxin [norfloxacin]; Cephalexin; Chocolate; Codeine; Sulfa antibiotics; Latex; and Penicillins   Social History   Tobacco Use  . Smoking status: Never Smoker  . Smokeless tobacco: Never Used  Substance Use Topics  . Alcohol use: No  . Drug use: No     Family Hx: The patient's family history includes Breast cancer in her cousin and  cousin; Breast cancer (age of onset: 12) in her paternal aunt; Colon cancer (age of onset: 19) in her sister; Diabetes in her mother; Heart disease in her mother; Hypertension in her mother; Prostate cancer (age of onset: 98) in her father; Stroke in her father and mother.  ROS:   Please see the history of present illness.     All other systems reviewed and are negative.   Labs/Other Tests and Data Reviewed:    Recent Labs: 10/25/2018: ALT 21; BUN 10; Creatinine 1.04; Hemoglobin 13.0; Platelet Count 240; Potassium 4.4; Sodium 138   Recent Lipid Panel No results found for: CHOL, TRIG, HDL, CHOLHDL, LDLCALC, LDLDIRECT  Wt Readings from Last 3 Encounters:  03/30/19 239 lb (108.4 kg)  10/25/18 238 lb 1.6 oz (108 kg)  07/25/18 236 lb 3.2 oz (107.1 kg)     Objective:    Vital Signs:  BP 134/83   Pulse 69   Ht 5\' 4"  (1.626 m)   Wt 239 lb (108.4 kg)   BMI 41.02 kg/m     ASSESSMENT & PLAN:    1.  DCM - this was felt secondary to prior chemotherapy.  Her initial EF was 30 to 35% but has normalized and last echo a year ago was 50 to 55%.  She is NYHA CHF class 2a.  She will continue on Corlanor 5 mg twice daily, Entresto 24-26 mg twice daily and carvedilol 3.125 mg twice daily.  Her last creatinine was stable at 1.04 on 10/25/2018.  2.  Hypertension - her BP has been controlled.  She will continue on carvedilol 3.125 mg twice daily, Entresto 24-26 mg twice daily. Her last creatinine was 1.04 in 10/2018.     3.  Chronic diastolic CHF -she has not had any symptoms of lower extremity edema.  SHe does have DOE but has gained 40lb after starting her new chemo drug.  She does not think the weight gain is fluid.   She has not required any diuretic therapy.  She will continue on beta-blocker therapy  4.  Breast CA - she has a history of left breast-stage II infiltrating ductal carcinoma, 3/8 axillary lymph nodes were positive for metastatic carcinoma in 1996 with left  mastectomy.  Then developed   Stage 1A invasive ductal CA s/p right mastectomy.   5.  COVID-19 Education:The signs and symptoms of COVID-19 were discussed with the patient and how to seek care for testing (follow up with PCP or arrange E-visit).  The importance of social distancing was discussed today.  Patient Risk:   After full review of this patient's clinical status, I feel that they are at least moderate risk at this time.  Time:   Today, I have spent 12 minutes directly with the patient on telephone discussing medical problems including HTN, DCM, CHF.  We also reviewed the symptoms of COVID 19 and the ways to protect against contracting the virus with telehealth technology.  I spent an additional 5 minutes reviewing patient's chart including 2D echo and labs.  Medication Adjustments/Labs and Tests Ordered: Current medicines are reviewed at length with the patient today.  Concerns regarding medicines are outlined above.  Tests Ordered: No orders of the defined types were placed in this encounter.  Medication Changes: No orders of the defined types were placed in this encounter.   Disposition:  Follow up in 1 year(s)  Signed, Fransico Him, MD  03/30/2019 9:40 AM    [facility]

## 2019-03-30 ENCOUNTER — Other Ambulatory Visit: Payer: Self-pay

## 2019-03-30 ENCOUNTER — Encounter: Payer: Self-pay | Admitting: Cardiology

## 2019-03-30 ENCOUNTER — Telehealth (INDEPENDENT_AMBULATORY_CARE_PROVIDER_SITE_OTHER): Admitting: Cardiology

## 2019-03-30 VITALS — BP 134/83 | HR 69 | Ht 64.0 in | Wt 239.0 lb

## 2019-03-30 DIAGNOSIS — I110 Hypertensive heart disease with heart failure: Secondary | ICD-10-CM

## 2019-03-30 DIAGNOSIS — I10 Essential (primary) hypertension: Secondary | ICD-10-CM

## 2019-03-30 DIAGNOSIS — Z853 Personal history of malignant neoplasm of breast: Secondary | ICD-10-CM | POA: Diagnosis not present

## 2019-03-30 DIAGNOSIS — I420 Dilated cardiomyopathy: Secondary | ICD-10-CM

## 2019-03-30 DIAGNOSIS — I5022 Chronic systolic (congestive) heart failure: Secondary | ICD-10-CM

## 2019-03-30 DIAGNOSIS — Z7189 Other specified counseling: Secondary | ICD-10-CM

## 2019-03-30 NOTE — Patient Instructions (Signed)

## 2019-04-10 ENCOUNTER — Other Ambulatory Visit: Payer: Self-pay | Admitting: Cardiology

## 2019-04-10 MED ORDER — SACUBITRIL-VALSARTAN 24-26 MG PO TABS: 1.0000 | ORAL_TABLET | Freq: Two times a day (BID) | ORAL | 3 refills | Status: DC

## 2019-04-10 MED ORDER — IVABRADINE HCL 5 MG PO TABS: 5.0000 mg | ORAL_TABLET | Freq: Two times a day (BID) | ORAL | 3 refills | Status: DC

## 2019-04-10 NOTE — Telephone Encounter (Signed)
Pt's medications were sent to pt's pharmacy as requested. Confirmation received.  

## 2019-04-15 ENCOUNTER — Other Ambulatory Visit: Payer: Self-pay

## 2019-04-15 ENCOUNTER — Inpatient Hospital Stay (HOSPITAL_COMMUNITY)
Admission: EM | Admit: 2019-04-15 | Discharge: 2019-04-17 | DRG: 242 | Disposition: A | Discharge status: Home / Self Care | Attending: Internal Medicine | Admitting: Internal Medicine

## 2019-04-15 ENCOUNTER — Encounter (HOSPITAL_COMMUNITY): Payer: Self-pay | Admitting: Emergency Medicine

## 2019-04-15 ENCOUNTER — Emergency Department (HOSPITAL_COMMUNITY)

## 2019-04-15 DIAGNOSIS — H8109 Meniere's disease, unspecified ear: Secondary | ICD-10-CM | POA: Diagnosis present

## 2019-04-15 DIAGNOSIS — J22 Unspecified acute lower respiratory infection: Secondary | ICD-10-CM

## 2019-04-15 DIAGNOSIS — Z8673 Personal history of transient ischemic attack (TIA), and cerebral infarction without residual deficits: Secondary | ICD-10-CM | POA: Diagnosis not present

## 2019-04-15 DIAGNOSIS — Z905 Acquired absence of kidney: Secondary | ICD-10-CM | POA: Diagnosis not present

## 2019-04-15 DIAGNOSIS — Z853 Personal history of malignant neoplasm of breast: Secondary | ICD-10-CM

## 2019-04-15 DIAGNOSIS — Z800 Family history of malignant neoplasm of digestive organs: Secondary | ICD-10-CM

## 2019-04-15 DIAGNOSIS — Z9011 Acquired absence of right breast and nipple: Secondary | ICD-10-CM | POA: Diagnosis not present

## 2019-04-15 DIAGNOSIS — Z9012 Acquired absence of left breast and nipple: Secondary | ICD-10-CM

## 2019-04-15 DIAGNOSIS — Z8719 Personal history of other diseases of the digestive system: Secondary | ICD-10-CM

## 2019-04-15 DIAGNOSIS — U071 COVID-19: Secondary | ICD-10-CM | POA: Diagnosis present

## 2019-04-15 DIAGNOSIS — G629 Polyneuropathy, unspecified: Secondary | ICD-10-CM | POA: Diagnosis present

## 2019-04-15 DIAGNOSIS — Z882 Allergy status to sulfonamides status: Secondary | ICD-10-CM

## 2019-04-15 DIAGNOSIS — E785 Hyperlipidemia, unspecified: Secondary | ICD-10-CM | POA: Diagnosis present

## 2019-04-15 DIAGNOSIS — K760 Fatty (change of) liver, not elsewhere classified: Secondary | ICD-10-CM | POA: Diagnosis present

## 2019-04-15 DIAGNOSIS — I459 Conduction disorder, unspecified: Secondary | ICD-10-CM

## 2019-04-15 DIAGNOSIS — Z803 Family history of malignant neoplasm of breast: Secondary | ICD-10-CM

## 2019-04-15 DIAGNOSIS — Z9221 Personal history of antineoplastic chemotherapy: Secondary | ICD-10-CM

## 2019-04-15 DIAGNOSIS — G4733 Obstructive sleep apnea (adult) (pediatric): Secondary | ICD-10-CM | POA: Diagnosis present

## 2019-04-15 DIAGNOSIS — K589 Irritable bowel syndrome without diarrhea: Secondary | ICD-10-CM | POA: Diagnosis present

## 2019-04-15 DIAGNOSIS — I110 Hypertensive heart disease with heart failure: Secondary | ICD-10-CM | POA: Diagnosis present

## 2019-04-15 DIAGNOSIS — I420 Dilated cardiomyopathy: Secondary | ICD-10-CM | POA: Diagnosis present

## 2019-04-15 DIAGNOSIS — Z95818 Presence of other cardiac implants and grafts: Secondary | ICD-10-CM

## 2019-04-15 DIAGNOSIS — R001 Bradycardia, unspecified: Secondary | ICD-10-CM | POA: Diagnosis present

## 2019-04-15 DIAGNOSIS — Z79899 Other long term (current) drug therapy: Secondary | ICD-10-CM

## 2019-04-15 DIAGNOSIS — Z8679 Personal history of other diseases of the circulatory system: Secondary | ICD-10-CM | POA: Diagnosis not present

## 2019-04-15 DIAGNOSIS — Z833 Family history of diabetes mellitus: Secondary | ICD-10-CM

## 2019-04-15 DIAGNOSIS — Z7951 Long term (current) use of inhaled steroids: Secondary | ICD-10-CM

## 2019-04-15 DIAGNOSIS — Z9049 Acquired absence of other specified parts of digestive tract: Secondary | ICD-10-CM

## 2019-04-15 DIAGNOSIS — I5022 Chronic systolic (congestive) heart failure: Secondary | ICD-10-CM | POA: Diagnosis present

## 2019-04-15 DIAGNOSIS — Z950 Presence of cardiac pacemaker: Secondary | ICD-10-CM

## 2019-04-15 DIAGNOSIS — Z9079 Acquired absence of other genital organ(s): Secondary | ICD-10-CM | POA: Diagnosis not present

## 2019-04-15 DIAGNOSIS — Z91018 Allergy to other foods: Secondary | ICD-10-CM

## 2019-04-15 DIAGNOSIS — J309 Allergic rhinitis, unspecified: Secondary | ICD-10-CM | POA: Diagnosis present

## 2019-04-15 DIAGNOSIS — H9192 Unspecified hearing loss, left ear: Secondary | ICD-10-CM | POA: Diagnosis present

## 2019-04-15 DIAGNOSIS — I441 Atrioventricular block, second degree: Secondary | ICD-10-CM | POA: Diagnosis present

## 2019-04-15 DIAGNOSIS — Z6841 Body Mass Index (BMI) 40.0 and over, adult: Secondary | ICD-10-CM

## 2019-04-15 DIAGNOSIS — Z90722 Acquired absence of ovaries, bilateral: Secondary | ICD-10-CM

## 2019-04-15 DIAGNOSIS — Z8249 Family history of ischemic heart disease and other diseases of the circulatory system: Secondary | ICD-10-CM

## 2019-04-15 DIAGNOSIS — Z888 Allergy status to other drugs, medicaments and biological substances status: Secondary | ICD-10-CM

## 2019-04-15 DIAGNOSIS — I10 Essential (primary) hypertension: Secondary | ICD-10-CM | POA: Diagnosis present

## 2019-04-15 DIAGNOSIS — Z823 Family history of stroke: Secondary | ICD-10-CM

## 2019-04-15 DIAGNOSIS — R9431 Abnormal electrocardiogram [ECG] [EKG]: Secondary | ICD-10-CM | POA: Diagnosis not present

## 2019-04-15 DIAGNOSIS — Z881 Allergy status to other antibiotic agents status: Secondary | ICD-10-CM

## 2019-04-15 DIAGNOSIS — Z880 Allergy status to penicillin: Secondary | ICD-10-CM

## 2019-04-15 DIAGNOSIS — D72819 Decreased white blood cell count, unspecified: Secondary | ICD-10-CM | POA: Diagnosis present

## 2019-04-15 DIAGNOSIS — Z91040 Latex allergy status: Secondary | ICD-10-CM

## 2019-04-15 LAB — CBC WITH DIFFERENTIAL/PLATELET
Abs Immature Granulocytes: 0.01 10*3/uL (ref 0.00–0.07)
Basophils Absolute: 0 10*3/uL (ref 0.0–0.1)
Basophils Relative: 0 %
Eosinophils Absolute: 0.1 10*3/uL (ref 0.0–0.5)
Eosinophils Relative: 2 %
HCT: 40.4 % (ref 36.0–46.0)
Hemoglobin: 13.4 g/dL (ref 12.0–15.0)
Immature Granulocytes: 0 %
Lymphocytes Relative: 23 %
Lymphs Abs: 1 10*3/uL (ref 0.7–4.0)
MCH: 28 pg (ref 26.0–34.0)
MCHC: 33.2 g/dL (ref 30.0–36.0)
MCV: 84.5 fL (ref 80.0–100.0)
Monocytes Absolute: 0.5 10*3/uL (ref 0.1–1.0)
Monocytes Relative: 10 %
Neutro Abs: 2.9 10*3/uL (ref 1.7–7.7)
Neutrophils Relative %: 65 %
Platelets: 189 10*3/uL (ref 150–400)
RBC: 4.78 MIL/uL (ref 3.87–5.11)
RDW: 15 % (ref 11.5–15.5)
WBC: 4.5 10*3/uL (ref 4.0–10.5)
nRBC: 0 % (ref 0.0–0.2)

## 2019-04-15 LAB — TRIGLYCERIDES: Triglycerides: 110 mg/dL (ref <150)

## 2019-04-15 LAB — COMPREHENSIVE METABOLIC PANEL
ALT: 20 U/L (ref 0–44)
AST: 32 U/L (ref 15–41)
Albumin: 2.8 g/dL — ABNORMAL LOW (ref 3.5–5.0)
Alkaline Phosphatase: 74 U/L (ref 38–126)
Anion gap: 9 (ref 5–15)
BUN: 7 mg/dL — ABNORMAL LOW (ref 8–23)
CO2: 25 mmol/L (ref 22–32)
Calcium: 8.7 mg/dL — ABNORMAL LOW (ref 8.9–10.3)
Chloride: 101 mmol/L (ref 98–111)
Creatinine, Ser: 0.95 mg/dL (ref 0.44–1.00)
GFR calc Af Amer: >60 mL/min (ref >60)
GFR calc non Af Amer: >60 mL/min (ref >60)
Glucose, Bld: 104 mg/dL — ABNORMAL HIGH (ref 70–99)
Potassium: 3.7 mmol/L (ref 3.5–5.1)
Sodium: 135 mmol/L (ref 135–145)
Total Bilirubin: 0.6 mg/dL (ref 0.3–1.2)
Total Protein: 6.5 g/dL (ref 6.5–8.1)

## 2019-04-15 LAB — SARS CORONAVIRUS 2 BY RT PCR (HOSPITAL ORDER, PERFORMED IN ~~LOC~~ HOSPITAL LAB): SARS Coronavirus 2: POSITIVE — AB

## 2019-04-15 LAB — LACTIC ACID, PLASMA: Lactic Acid, Venous: 0.9 mmol/L (ref 0.5–1.9)

## 2019-04-15 LAB — D-DIMER, QUANTITATIVE: D-Dimer, Quant: 0.95 ug/mL-FEU — ABNORMAL HIGH (ref 0.00–0.50)

## 2019-04-15 LAB — FIBRINOGEN: Fibrinogen: 405 mg/dL (ref 210–475)

## 2019-04-15 LAB — C-REACTIVE PROTEIN: CRP: 4.7 mg/dL — ABNORMAL HIGH (ref <1.0)

## 2019-04-15 LAB — BRAIN NATRIURETIC PEPTIDE: B Natriuretic Peptide: 142.9 pg/mL — ABNORMAL HIGH (ref 0.0–100.0)

## 2019-04-15 LAB — TROPONIN I: Troponin I: <0.03 ng/mL (ref <0.03)

## 2019-04-15 LAB — PROCALCITONIN: Procalcitonin: <0.1 ng/mL

## 2019-04-15 LAB — LACTATE DEHYDROGENASE: LDH: 177 U/L (ref 98–192)

## 2019-04-15 LAB — FERRITIN: Ferritin: 58 ng/mL (ref 11–307)

## 2019-04-15 MED ORDER — BISACODYL 5 MG PO TBEC: 5.0000 mg | DELAYED_RELEASE_TABLET | Freq: Every day | ORAL | Status: DC | PRN

## 2019-04-15 MED ORDER — ENSURE ENLIVE PO LIQD: 237.0000 mL | Freq: Two times a day (BID) | ORAL | Status: DC

## 2019-04-15 MED ORDER — ONDANSETRON HCL 4 MG PO TABS: 4.0000 mg | ORAL_TABLET | Freq: Four times a day (QID) | ORAL | Status: DC | PRN

## 2019-04-15 MED ORDER — ONDANSETRON HCL 4 MG/2ML IJ SOLN: 4.0000 mg | Freq: Four times a day (QID) | INTRAMUSCULAR | Status: DC | PRN

## 2019-04-15 MED ORDER — ANASTROZOLE 1 MG PO TABS
1.0000 mg | ORAL_TABLET | Freq: Every day | ORAL | Status: DC
  Filled 2019-04-15 (×2): qty 1

## 2019-04-15 MED ORDER — FLUTICASONE PROPIONATE 50 MCG/ACT NA SUSP
1.0000 | Freq: Every morning | NASAL | Status: DC
  Administered 2019-04-16: 1 via NASAL
  Filled 2019-04-15: qty 16

## 2019-04-15 MED ORDER — POLYETHYLENE GLYCOL 3350 17 G PO PACK: 17.0000 g | PACK | Freq: Every day | ORAL | Status: DC | PRN

## 2019-04-15 MED ORDER — DOCUSATE SODIUM 100 MG PO CAPS
100.0000 mg | ORAL_CAPSULE | Freq: Two times a day (BID) | ORAL | Status: DC
  Administered 2019-04-15 – 2019-04-16 (×2): 100 mg via ORAL
  Filled 2019-04-15 (×4): qty 1

## 2019-04-15 MED ORDER — DULOXETINE HCL 30 MG PO CPEP
30.0000 mg | ORAL_CAPSULE | Freq: Every day | ORAL | Status: DC
  Administered 2019-04-15 – 2019-04-16 (×2): 30 mg via ORAL
  Filled 2019-04-15 (×2): qty 1

## 2019-04-15 MED ORDER — ENOXAPARIN SODIUM 40 MG/0.4ML ~~LOC~~ SOLN
40.0000 mg | SUBCUTANEOUS | Status: DC
  Administered 2019-04-15 – 2019-04-16 (×2): 40 mg via SUBCUTANEOUS
  Filled 2019-04-15 (×2): qty 0.4

## 2019-04-15 MED ORDER — ALPRAZOLAM 0.5 MG PO TABS
0.5000 mg | ORAL_TABLET | Freq: Two times a day (BID) | ORAL | Status: DC
  Administered 2019-04-15 – 2019-04-16 (×3): 0.5 mg via ORAL
  Filled 2019-04-15: qty 2
  Filled 2019-04-15: qty 1
  Filled 2019-04-15: qty 2

## 2019-04-15 MED ORDER — NORTRIPTYLINE HCL 25 MG PO CAPS: 25.0000 mg | ORAL_CAPSULE | Freq: Every evening | ORAL | Status: DC | PRN

## 2019-04-15 MED ORDER — LORATADINE 10 MG PO TABS
10.0000 mg | ORAL_TABLET | Freq: Every day | ORAL | Status: DC
  Administered 2019-04-16: 10 mg via ORAL
  Filled 2019-04-15: qty 1

## 2019-04-15 MED ORDER — SODIUM CHLORIDE 0.9% FLUSH
3.0000 mL | Freq: Two times a day (BID) | INTRAVENOUS | Status: DC
  Administered 2019-04-16 (×2): 3 mL via INTRAVENOUS

## 2019-04-15 MED ORDER — GABAPENTIN 300 MG PO CAPS
300.0000 mg | ORAL_CAPSULE | Freq: Two times a day (BID) | ORAL | Status: DC
  Administered 2019-04-15 – 2019-04-16 (×3): 300 mg via ORAL
  Filled 2019-04-15 (×3): qty 1

## 2019-04-15 MED ORDER — SACUBITRIL-VALSARTAN 24-26 MG PO TABS
1.0000 | ORAL_TABLET | Freq: Two times a day (BID) | ORAL | Status: DC
  Administered 2019-04-16: 1 via ORAL
  Filled 2019-04-15 (×5): qty 1

## 2019-04-15 MED ORDER — OXYCODONE HCL 5 MG PO TABS: 5.0000 mg | ORAL_TABLET | ORAL | Status: DC | PRN

## 2019-04-15 MED ORDER — ACETAMINOPHEN 325 MG PO TABS
650.0000 mg | ORAL_TABLET | Freq: Four times a day (QID) | ORAL | Status: DC | PRN
  Administered 2019-04-15 – 2019-04-16 (×2): 650 mg via ORAL
  Filled 2019-04-15 (×2): qty 2

## 2019-04-15 MED ORDER — PANTOPRAZOLE SODIUM 40 MG PO TBEC
40.0000 mg | DELAYED_RELEASE_TABLET | Freq: Every day | ORAL | Status: DC
  Administered 2019-04-16: 40 mg via ORAL
  Filled 2019-04-15: qty 1

## 2019-04-15 NOTE — ED Provider Notes (Signed)
[facility] EMERGENCY DEPARTMENT Provider Note   CSN: [id number] Arrival date & time: 04/15/19  1022    History   Chief Complaint Chief Complaint  Patient presents with  . Cough  . Heart Block    HPI Meagan Ryan is a 63 y.o. female.     HPI The patient presents to the emergency room for evaluation of cough, myalgias, fever and body aches that started a little less than a week ago.  Patient states a family member recently tested positive for COVID-19.  Patient has been having a dry cough.  She has had low-grade fevers.  She is had some diffuse body aches as well.  Patient has felt very fatigued.  She has noticed a lack of taste and smell.  Her symptoms have been getting worse and she was started to feel more lightheaded and weak.  EMS was called.  They noted the patient had what appeared to be heart block.  Her blood pressure has remained otherwise stable.  She denies any vomiting or diarrhea.  No chest pain.  No syncope. Past Medical History:  Diagnosis Date  . Allergic rhinitis   . Asthma   . Breast cancer ([place]) 1996   Left breast-stage II infiltrating ductal carcinoma, 3/8 axillary lymph nodes were positive for metastatic carcinoma, getting yearly mammogram and every other year MRI followed by oncologist Dr Benay Spice  . Complication of anesthesia    WAKE UP WITH HEADACHE, difficulty waking up  . DCM (dilated cardiomyopathy) ([place])    EF normalized at 60-65% on echo 07/2016  . Dysuria 2011  . Family history of adverse reaction to anesthesia    "family wakes up with personality changes, but are very short term"  . Family history of breast cancer   . Fatty liver CT 1/12   elevated LFT  . Genetic testing 03/13/2018   Common cancers panel (47 genes) @ [facility] - No pathogenic mutations detected  . H/O actinic keratosis   . H/O allergic rhinitis   . H/O fibromyalgia   . H/O osteopenia 2009  . H/O peripheral neuropathy   . H/O varicose veins   . History of  breast cancer   . History of IBS   . Hx of colonic polyps   . Hx of migraines   . Hx of ovarian cyst   . Hyperlipidemia   . Hypertension   . Incomplete RBBB   . 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 OSA (obstructive sleep apnea) 12/31/2015   Very mild with AHI 6/hr but elevated RDI at 32/hr.  Intolerant to PAP therapy  . Pelvic pain in female   . PMB (postmenopausal bleeding) 2011  . Pregnancy induced hypertension   . Right-sided lacunar infarction ([place])    MRI 1.2012  . Tubular adenoma of colon     Patient Active Problem List   Diagnosis Date Noted  . History of left breast cancer 05/01/2018  . Genetic testing 03/13/2018  . Family history of breast cancer   . Malignant neoplasm of upper-outer quadrant of right breast in female, estrogen receptor positive ([place]) 02/23/2018  . Syncope 12/14/2017  . Pain and swelling of right lower leg 10/28/2016  . Right leg numbness 10/28/2016  . Chronic systolic CHF (congestive heart failure), NYHA class 2 ([place]) 02/26/2016  . OSA (obstructive sleep apnea) 12/31/2015  . History of breast cancer in female 10/30/2015  . Ovarian cyst 10/10/2015  . Bilateral ovarian cysts 10/09/2015  .  S/P oophorectomy 10/09/2015  . Morbid obesity with BMI of 40.0-44.9, adult ([place]) 08/08/2015  . Benign essential HTN 09/18/2014  . RBBB 09/18/2014  . Incomplete RBBB   . DCM (dilated cardiomyopathy) ([place]) 04/24/2014  . Meniere disease 06/02/2012    Past Surgical History:  Procedure Laterality Date  . Gastroc muscle repair  2007  . LAPAROSCOPIC CHOLECYSTECTOMY    . MASTECTOMY  06/1995   Left - for infiltrating ductal carcinoma  . MASTECTOMY W/ SENTINEL NODE BIOPSY Right 03/31/2018  . MASTECTOMY W/ SENTINEL NODE BIOPSY Right 03/31/2018   Procedure: RIGHT TOTAL MASTECTOMY WITH AXILLARY SENTINEL LYMPH NODE BIOPSY ERAS PATHWAY;  Surgeon: Excell Seltzer, MD;  Location: [facility];  Service: General;  Laterality: Right;  . NEPHRECTOMY Right late 1990s   due to  kidney shrinkage  . ROBOTIC ASSISTED TOTAL HYSTERECTOMY WITH BILATERAL SALPINGO OOPHERECTOMY Bilateral 10/09/2015   Procedure: ROBOTIC ASSISTED BILATERAL SALPINGO OOPHORECTOMY ;  Surgeon: Everitt Amber, MD;  Location: [facility];  Service: Gynecology;  Laterality: Bilateral;  . Tram surgery  1996  . TUBAL LIGATION       OB History    Gravida  1   Para  1   Term  1   Preterm      AB      Living  1     SAB      TAB      Ectopic      Multiple      Live Births  1            Home Medications    Prior to Admission medications   Medication Sig Start Date End Date Taking? Authorizing Provider  ALPRAZolam Duanne Moron) 0.5 MG tablet Take 0.5 mg by mouth 2 (two) times daily.     [provider]  anastrozole (ARIMIDEX) 1 MG tablet Take 1 tablet (1 mg total) by mouth daily. 06/02/18   Alla Feeling, NP  azelastine (ASTELIN) 0.1 % nasal spray Place 1 spray into both nostrils 2 (two) times daily as needed for rhinitis or allergies.  07/21/15   [provider]  carvedilol (COREG) 3.125 MG tablet Take 1 tablet (3.125 mg total) by mouth 2 (two) times daily with a meal. Please keep upcoming appt with Dr. Radford Pax. Thank you 03/23/19   Sueanne Margarita, MD  cetirizine (ZYRTEC) 10 MG tablet Take 10 mg by mouth every morning.     [provider]  cholecalciferol (VITAMIN D3) 25 MCG (1000 UT) tablet Take 2,000 Units by mouth daily.    [provider]  DULoxetine (CYMBALTA) 30 MG capsule Take 30 mg by mouth at bedtime.     [provider]  fluticasone (FLONASE) 50 MCG/ACT nasal spray Place 1 spray into both nostrils every morning.  06/26/15   [provider]  gabapentin (NEURONTIN) 300 MG capsule Take 300 mg by mouth 2 (two) times daily. 02/14/16   [provider]  ibuprofen (ADVIL,MOTRIN) 200 MG tablet Take 600 mg by mouth daily as needed for moderate pain.    [provider]  ivabradine (CORLANOR) 5 MG TABS tablet Take 1 tablet (5 mg  total) by mouth 2 (two) times daily with a meal. 04/10/19   Turner, Eber Hong, MD  Multiple Vitamins-Minerals (AIRBORNE GUMMIES) CHEW Chew 2 tablets by mouth daily.    [provider]  niacin 500 MG tablet Take 500 mg by mouth daily.    [provider]  nortriptyline (PAMELOR) 25 MG capsule Take 25 mg by  mouth at bedtime as needed for sleep.     [provider]  Omega-3 Fatty Acids (FISH OIL) 1000 MG CAPS Take 2,000 mg by mouth 2 (two) times daily.    [provider]  omeprazole (PRILOSEC) 20 MG capsule Take 20 mg by mouth daily. 12/29/15   [provider]  potassium chloride (K-DUR) 10 MEQ tablet Take 10 mEq by mouth daily.    [provider]  sacubitril-valsartan (ENTRESTO) 24-26 MG Take 1 tablet by mouth 2 (two) times daily. 04/10/19   Sueanne Margarita, MD    Family History Family History  Problem Relation Age of Onset  . Heart disease Mother        MI  . Stroke Mother   . Diabetes Mother   . Hypertension Mother   . Stroke Father   . Prostate cancer Father 33       deceased 71  . Colon cancer Sister 76       currently 59  . Breast cancer Paternal Aunt 34       deceased 88s  . Breast cancer Cousin        daughter of pat aunt w/ breast ca  . Breast cancer Cousin        daughter of pat aunt w/ breast ca    Social History Social History   Tobacco Use  . Smoking status: Never Smoker  . Smokeless tobacco: Never Used  Substance Use Topics  . Alcohol use: No  . Drug use: No     Allergies   Amlodipine; Noroxin [norfloxacin]; Cephalexin; Chocolate; Codeine; Sulfa antibiotics; Latex; and Penicillins   Review of Systems Review of Systems  All other systems reviewed and are negative.    Physical Exam Updated Vital Signs BP 136/76   Pulse (!) 46   Temp 98.7 F (37.1 C)   Resp (!) 25   SpO2 100%   Physical Exam Vitals signs and nursing note reviewed.  Constitutional:      General: She is not in acute distress.     Appearance: She is well-developed.  HENT:     Head: Normocephalic and atraumatic.     Right Ear: External ear normal.     Left Ear: External ear normal.  Eyes:     General: No scleral icterus.       Right eye: No discharge.        Left eye: No discharge.     Conjunctiva/sclera: Conjunctivae normal.  Neck:     Musculoskeletal: Neck supple.     Trachea: No tracheal deviation.  Cardiovascular:     Rate and Rhythm: Bradycardia present. Rhythm irregular.  Pulmonary:     Effort: Pulmonary effort is normal. No respiratory distress.     Breath sounds: Normal breath sounds. No stridor. No wheezing or rales.  Abdominal:     General: Bowel sounds are normal. There is no distension.     Palpations: Abdomen is soft.     Tenderness: There is no abdominal tenderness. There is no guarding or rebound.  Musculoskeletal:        General: No tenderness.  Skin:    General: Skin is warm and dry.     Findings: No rash.  Neurological:     Mental Status: She is alert.     Cranial Nerves: No cranial nerve deficit (no facial droop, extraocular movements intact, no slurred speech).     Sensory: No sensory deficit.     Motor: No abnormal muscle tone or seizure  activity.     Coordination: Coordination normal.      ED Treatments / Results  Labs (all labs ordered are listed, but only abnormal results are displayed) Labs Reviewed  SARS CORONAVIRUS 2 (HOSPITAL ORDER, [facility]) - Abnormal; Notable for the following components:      Result Value   SARS Coronavirus 2 POSITIVE (*)    All other components within normal limits  COMPREHENSIVE METABOLIC PANEL - Abnormal; Notable for the following components:   Glucose, Bld 104 (*)    BUN 7 (*)    Calcium 8.7 (*)    Albumin 2.8 (*)    All other components within normal limits  D-DIMER, QUANTITATIVE (NOT AT [facility]) - Abnormal; Notable for the following components:   D-Dimer, Quant 0.95 (*)    All other components within normal  limits  C-REACTIVE PROTEIN - Abnormal; Notable for the following components:   CRP 4.7 (*)    All other components within normal limits  BRAIN NATRIURETIC PEPTIDE - Abnormal; Notable for the following components:   B Natriuretic Peptide 142.9 (*)    All other components within normal limits  CULTURE, BLOOD (ROUTINE X 2)  CULTURE, BLOOD (ROUTINE X 2)  LACTIC ACID, PLASMA  CBC WITH DIFFERENTIAL/PLATELET  PROCALCITONIN  LACTATE DEHYDROGENASE  FERRITIN  FIBRINOGEN  TROPONIN I  TRIGLYCERIDES  LACTIC ACID, PLASMA    EKG EKG Interpretation  Date/Time:  Sunday April 15 2019 10:33:05 EDT Ventricular Rate:  34 PR Interval:    QRS Duration: 156 QT Interval:  684 QTC Calculation: 515 R Axis:   -55 Text Interpretation:  Predominant 3:1 AV block Right bundle branch block LVH with IVCD and secondary repol abnrm Prolonged QT interval heart block is new since last tracing Confirmed by Dorie Rank [phone]) on 04/15/2019 10:36:03 AM   Radiology Dg Chest Port 1 View  Result Date: 04/15/2019 CLINICAL DATA:  Weakness.  Chills. Cough. EXAM: PORTABLE CHEST 1 VIEW COMPARISON:  04/20/2018 FINDINGS: Midline trachea. Cardiomegaly accentuated by AP portable technique. No pleural effusion or pneumothorax. No congestive failure. External pacer/defibrillator. Clear lungs. Surgical clips in the left axilla. IMPRESSION: Cardiomegaly without congestive failure. Electronically Signed   By: Abigail Miyamoto M.D.   On: 04/15/2019 11:16    Procedures .Critical Care Performed by: Dorie Rank, MD Authorized by: Dorie Rank, MD   Critical care provider statement:    Critical care time (minutes):  45   Critical care was time spent personally by me on the following activities:  Discussions with consultants, evaluation of patient's response to treatment, examination of patient, ordering and performing treatments and interventions, ordering and review of laboratory studies, ordering and review of radiographic studies, pulse  oximetry, re-evaluation of patient's condition, obtaining history from patient or surrogate and review of old charts   (including critical care time)  Medications Ordered in ED Medications - No data to display   Initial Impression / Assessment and Plan / ED Course  I have reviewed the triage vital signs and the nursing notes.  Pertinent labs & imaging results that were available during my care of the patient were reviewed by me and considered in my medical decision making (see chart for details).  Clinical Course as of Apr 14 1405  Sun Apr 15, 2019  1042 EKG and telemetry strips reviewed.  Appears to have a second-degree heart block.  Blood pressure is stable.    We will continue to monitor closely.   [WE]  3154 Discussed with Dr Domenic Polite  regarding EKG findings.  Will continue to monitor in ED.  Hold all of her home meds for now.  Will check labs and determine whether she will need to be admitted at [facility] for [facility]   [JK]  1222 Slight increase in d-dimer and CRP.  CBC and electrolyte panel unremarkable.   [VW]  0981 Discussed with Dr Domenic Polite.  Would have pt remain at [facility] in case she would need to have a pacemaker.  This may be related to her meds.    [XB]  1478 Updated patient on the results.  She remains stable.  We will plan on admission for further treatment.   [GN]  5621 I spoke with pt's daughter and updated her on the plan   [JK]    Clinical Course User Index [JK] Dorie Rank, MD     Patient presented to the emergency room for evaluation of shortness of breath.  She had known COVID-19 exposure.  Patient is positive for COVID-19.  Fortunately she is oxygenating well and is breathing without any difficulty.  Patient also has bradycardia.  This appears to be at least a second-degree heart block.  I spoken with Dr. Domenic Polite, cardiology.  He has reviewed EKGs.  Patient does not require pacemaker at this time.  We will hold her carvedilol and ivabradine.  Patient may  potentially require pacemaker if her symptoms do not improve but no emergent need at this time.  I will consult the medical service for further admission and treatment.  Final Clinical Impressions(s) / ED Diagnoses   Final diagnoses:  Lower respiratory tract infection due to COVID-19 virus  Heart block      Dorie Rank, MD 04/15/19 1406

## 2019-04-15 NOTE — Plan of Care (Signed)
  Problem: Clinical Measurements: maintaining saturation and chest expansion wo difficulty, oxygen therapy for supplemental support Goal: Respiratory complications will improve Outcome: Progressing   Problem: Clinical Measurements: continue to monitor status and report changes accordingly Goal: Cardiovascular complication will be avoided Outcome: Not Progressing

## 2019-04-15 NOTE — ED Notes (Signed)
ED TO INPATIENT HANDOFF REPORT  ED Nurse Name and Phone #: [phone] Thurston Pounds Name/Age/Gender Meagan Ryan 63 y.o. female Room/Bed: 022C/022C  Code Status   Code Status: Full Code  Home/SNF/Other Home Patient oriented to: self, place, time and situation Is this baseline? Yes   Triage Complete: Triage complete  Chief Complaint ? COVID; Heart Blockage  Triage Note Pt arrives to ED from home with complaints of weakness, fevers, chills, cough, and loss of taste. When EMS arrived pts HR was in the 30's and pts EKG showed heart block. Pt stated she has heart damage from her chemotherapy for hx of breast cancer. Pts daughter was tested positive for COVID19 last week.    Allergies Allergies  Allergen Reactions  . Amlodipine Shortness Of Breath  . Noroxin [Norfloxacin] Shortness Of Breath  . Cephalexin Other (See Comments) and Nausea And Vomiting    Fever got to 105 High temperature  . Chocolate     Migraines   . Codeine Nausea And Vomiting  . Sulfa Antibiotics Nausea And Vomiting  . Latex Itching and Rash    redness  . Penicillins Rash    Has patient had a PCN reaction causing immediate rash, facial/tongue/throat swelling, SOB or lightheadedness with hypotension:No Has patient had a PCN reaction causing severe rash involving mucus membranes or skin necrosis: Yes Has patient had a PCN reaction that required hospitalization No Has patient had a PCN reaction occurring within the last 10 years: No If all of the above answers are "NO", then may proceed with Cephalosporin use.     Level of Care/Admitting Diagnosis ED Disposition    ED Disposition Condition Comment   Admit  Hospital Area: [facility] [[id number]]  Level of Care: Progressive [102]  Covid Evaluation: Confirmed COVID Positive  Isolation Risk Level: Low Risk/Droplet (Less than 4L [place] supplementation)  Diagnosis: Symptomatic advanced heart block [[id number]]  Admitting Physician: Karmen Bongo  [[id number]]  Attending Physician: Karmen Bongo [[id number]]  Estimated length of stay: 3 - 4 days  Certification:: I certify this patient will need inpatient services for at least 2 midnights  PT Class (Do Not Modify): Inpatient [101]  PT Acc Code (Do Not Modify): Private [1]       B Medical/Surgery History Past Medical History:  Diagnosis Date  . Allergic rhinitis   . Asthma   . Breast cancer ([place]) 1996   Left breast-stage II infiltrating ductal carcinoma, 3/8 axillary lymph nodes were positive for metastatic carcinoma, getting yearly mammogram and every other year MRI followed by oncologist Dr Benay Spice  . Complication of anesthesia    WAKE UP WITH HEADACHE, difficulty waking up  . DCM (dilated cardiomyopathy) ([place])    EF normalized at 60-65% on echo 07/2016  . Dysuria 2011  . Family history of adverse reaction to anesthesia    "family wakes up with personality changes, but are very short term"  . Fatty liver CT 1/12   elevated LFT  . Genetic testing 03/13/2018   Common cancers panel (47 genes) @ [facility] - No pathogenic mutations detected  . H/O fibromyalgia   . H/O osteopenia 2009  . H/O peripheral neuropathy   . H/O varicose veins   . Hyperlipidemia   . Hypertension   . Incomplete RBBB   . 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 OSA (obstructive sleep apnea) 12/31/2015   Very mild with AHI 6/hr but elevated RDI at 32/hr.  Intolerant to PAP therapy  .  Pelvic pain in female   . PMB (postmenopausal bleeding) 2011  . Pregnancy induced hypertension   . Right-sided lacunar infarction ([place])    MRI 1.2012  . Tubular adenoma of colon    Past Surgical History:  Procedure Laterality Date  . Gastroc muscle repair  2007  . LAPAROSCOPIC CHOLECYSTECTOMY    . MASTECTOMY  06/1995   Left - for infiltrating ductal carcinoma  . MASTECTOMY W/ SENTINEL NODE BIOPSY Right 03/31/2018  . MASTECTOMY W/ SENTINEL NODE BIOPSY Right 03/31/2018   Procedure: RIGHT TOTAL MASTECTOMY WITH AXILLARY  SENTINEL LYMPH NODE BIOPSY ERAS PATHWAY;  Surgeon: Excell Seltzer, MD;  Location: [facility];  Service: General;  Laterality: Right;  . 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 ROBOTIC ASSISTED TOTAL HYSTERECTOMY WITH BILATERAL SALPINGO OOPHERECTOMY Bilateral 10/09/2015   Procedure: ROBOTIC ASSISTED BILATERAL SALPINGO OOPHORECTOMY ;  Surgeon: Everitt Amber, MD;  Location: [facility];  Service: Gynecology;  Laterality: Bilateral;  . Tram surgery  1996  . TUBAL LIGATION       A IV Location/Drains/Wounds Patient Lines/Drains/Airways Status   Active Line/Drains/Airways    Name:   Placement date:   Placement time:   Site:   Days:   Peripheral IV 04/15/19 Right Wrist   04/15/19    1031    Wrist   less than 1   Peripheral IV 04/15/19 Left Hand   04/15/19    1031    Hand   less than 1   Closed System Drain 1 Right Breast Bulb (JP)   03/31/18    0845    Breast   380   Incision (Closed) 10/09/15 Abdomen Other (Comment)   10/09/15    1630     1284   Incision (Closed) 10/09/15 Perineum Other (Comment)   10/09/15    1630     1284   Incision (Closed) 03/31/18 Breast Right   03/31/18    0811     380   Incision - 4 Ports Abdomen 1: Umbilicus 2: Right;Lateral 3: Left;Lateral 4: Left;Upper   10/09/15    1535     1284          Intake/Output Last 24 hours No intake or output data in the 24 hours ending 04/15/19 1519  Labs/Imaging Results for orders placed or performed during the hospital encounter of 04/15/19 (from the past 48 hour(s))  SARS Coronavirus 2 [facility] order, Performed in [facility] hospital lab)     Status: Abnormal   Collection Time: 04/15/19 10:42 AM  Result Value Ref Range   SARS Coronavirus 2 POSITIVE (A) NEGATIVE    Comment: CRITICAL RESULT CALLED TO, READ BACK BY AND VERIFIED WITH: RN MARLOW, C 1328 [id number] FCP (NOTE) If result is NEGATIVE SARS-CoV-2 target nucleic acids are NOT DETECTED. The SARS-CoV-2 RNA is generally detectable in upper and lower  respiratory  specimens during the acute phase of infection. The lowest  concentration of SARS-CoV-2 viral copies this assay can detect is 250  copies / mL. A negative result does not preclude SARS-CoV-2 infection  and should not be used as the sole basis for treatment or other  patient management decisions.  A negative result may occur with  improper specimen collection / handling, submission of specimen other  than nasopharyngeal swab, presence of viral mutation(s) within the  areas targeted by this assay, and inadequate number of viral copies  (<250 copies / mL). A negative result must be combined with clinical  observations, patient history,  and epidemiological information. If result is POSITIVE SARS-CoV-2 target nucleic acids are DETECTED.  The SARS-CoV-2 RNA is generally detectable in upper and lower  respiratory specimens during the acute phase of infection.  Positive  results are indicative of active infection with SARS-CoV-2.  Clinical  correlation with patient history and other diagnostic information is  necessary to determine patient infection status.  Positive results do  not rule out bacterial infection or co-infection with other viruses. If result is PRESUMPTIVE POSTIVE SARS-CoV-2 nucleic acids MAY BE PRESENT.   A presumptive positive result was obtained on the submitted specimen  and confirmed on repeat testing.  While 2019 novel coronavirus  (SARS-CoV-2) nucleic acids may be present in the submitted sample  additional confirmatory testing may be necessary for epidemiological  and / or clinical management purposes  to differentiate between  SARS-CoV-2 and other Sarbecovirus currently known to infect humans.  If clinically indicated additional testing with an alternate test  methodology [phone])  is advised. The SARS-CoV-2 RNA is generally  detectable in upper and lower respiratory specimens during the acute  phase of infection. The expected result is Negative. Fact Sheet for  Patients:  StrictlyIdeas.no Fact Sheet for Healthcare Providers: BankingDealers.co.za This test is not yet approved or cleared by the [place] FDA and has been authorized for detection and/or diagnosis of SARS-CoV-2 by FDA under an Emergency Use Authorization (EUA).  This EUA will remain in effect (meaning this test can be used) for the duration of the COVID-19 declaration under Section 564(b)(1) of the Act, 21 U.S.C. section 360bbb-3(b)(1), unless the authorization is terminated or revoked sooner. Performed at [facility], [place] [address] [place], [place] [address]   CBC WITH DIFFERENTIAL     Status: None   Collection Time: 04/15/19 10:42 AM  Result Value Ref Range   WBC 4.5 4.0 - 10.5 K/uL   RBC 4.78 3.87 - 5.11 MIL/uL   Hemoglobin 13.4 12.0 - 15.0 g/dL   HCT 40.4 36.0 - 46.0 %   MCV 84.5 80.0 - 100.0 fL   MCH 28.0 26.0 - 34.0 pg   MCHC 33.2 30.0 - 36.0 g/dL   RDW 15.0 11.5 - 15.5 %   Platelets 189 150 - 400 K/uL   nRBC 0.0 0.0 - 0.2 %   Neutrophils Relative % 65 %   Neutro Abs 2.9 1.7 - 7.7 K/uL   Lymphocytes Relative 23 %   Lymphs Abs 1.0 0.7 - 4.0 K/uL   Monocytes Relative 10 %   Monocytes Absolute 0.5 0.1 - 1.0 K/uL   Eosinophils Relative 2 %   Eosinophils Absolute 0.1 0.0 - 0.5 K/uL   Basophils Relative 0 %   Basophils Absolute 0.0 0.0 - 0.1 K/uL   Immature Granulocytes 0 %   Abs Immature Granulocytes 0.01 0.00 - 0.07 K/uL    Comment: Performed at [facility], [address], [place], [place] [address]  Comprehensive metabolic panel     Status: Abnormal   Collection Time: 04/15/19 10:42 AM  Result Value Ref Range   Sodium 135 135 - 145 mmol/L   Potassium 3.7 3.5 - 5.1 mmol/L   Chloride 101 98 - 111 mmol/L   CO2 25 22 - 32 mmol/L   Glucose, Bld 104 (H) 70 - 99 mg/dL   BUN 7 (L) 8 - 23 mg/dL   Creatinine, Ser 0.95 0.44 - 1.00 mg/dL   Calcium 8.7 (L) 8.9 - 10.3 mg/dL   Total Protein 6.5 6.5 - 8.1  g/dL   Albumin 2.8 (L) 3.5 - 5.0 g/dL   AST 32 15 - 41 U/L   ALT 20 0 - 44 U/L   Alkaline Phosphatase 74 38 - 126 U/L   Total Bilirubin 0.6 0.3 - 1.2 mg/dL   GFR calc non Af Amer >60 >60 mL/min   GFR calc Af Amer >60 >60 mL/min   Anion gap 9 5 - 15    Comment: Performed at [facility] [address], [place], [place] [address]  D-dimer, quantitative     Status: Abnormal   Collection Time: 04/15/19 10:42 AM  Result Value Ref Range   D-Dimer, Quant 0.95 (H) 0.00 - 0.50 ug/mL-FEU    Comment: (NOTE) At the manufacturer cut-off of 0.50 ug/mL FEU, this assay has been documented to exclude PE with a sensitivity and negative predictive value of 97 to 99%.  At this time, this assay has not been approved by the FDA to exclude DVT/VTE. Results should be correlated with clinical presentation. Performed at [facility], [place] [address], [place], [place] [address]   Procalcitonin     Status: None   Collection Time: 04/15/19 10:42 AM  Result Value Ref Range   Procalcitonin <0.10 ng/mL    Comment:        Interpretation: PCT (Procalcitonin) <= 0.5 ng/mL: Systemic infection (sepsis) is not likely. Local bacterial infection is possible. (NOTE)       Sepsis PCT Algorithm           Lower Respiratory Tract                                      Infection PCT Algorithm    ----------------------------     ----------------------------         PCT < 0.25 ng/mL                PCT < 0.10 ng/mL         Strongly encourage             Strongly discourage   discontinuation of antibiotics    initiation of antibiotics    ----------------------------     -----------------------------       PCT 0.25 - 0.50 ng/mL            PCT 0.10 - 0.25 ng/mL               OR       >80% decrease in PCT            Discourage initiation of                                            antibiotics      Encourage discontinuation           of antibiotics    ----------------------------      -----------------------------         PCT >= 0.50 ng/mL              PCT 0.26 - 0.50 ng/mL               AND        <80% decrease in PCT             Encourage initiation of  antibiotics       Encourage continuation           of antibiotics    ----------------------------     -----------------------------        PCT >= 0.50 ng/mL                  PCT > 0.50 ng/mL               AND         increase in PCT                  Strongly encourage                                      initiation of antibiotics    Strongly encourage escalation           of antibiotics                                     -----------------------------                                           PCT <= 0.25 ng/mL                                                 OR                                        > 80% decrease in PCT                                     Discontinue / Do not initiate                                             antibiotics Performed at [facility], [address] [place], [place] [address]   Lactate dehydrogenase     Status: None   Collection Time: 04/15/19 10:42 AM  Result Value Ref Range   LDH 177 98 - 192 U/L    Comment: Performed at [facility], [place] [address], [place], [place] [address]  Ferritin     Status: None   Collection Time: 04/15/19 10:42 AM  Result Value Ref Range   Ferritin 58 11 - 307 ng/mL    Comment: Performed at [facility], [place] [address] [place], [place] [address]  Fibrinogen     Status: None   Collection Time: 04/15/19 10:42 AM  Result Value Ref Range   Fibrinogen 405 210 - 475 mg/dL    Comment: Performed at [facility] [address] [place], [place] [address]  C-reactive protein     Status: Abnormal   Collection Time: 04/15/19 10:42 AM  Result Value Ref Range  CRP 4.7 (H) <1.0 mg/dL    Comment: Performed at [facility] [address], [place], [place] [address]  Troponin I - Once      Status: None   Collection Time: 04/15/19 10:42 AM  Result Value Ref Range   Troponin I <0.03 <0.03 ng/mL    Comment: Performed at [facility] [address], [place], [place] [address]  Brain natriuretic peptide     Status: Abnormal   Collection Time: 04/15/19 10:42 AM  Result Value Ref Range   B Natriuretic Peptide 142.9 (H) 0.0 - 100.0 pg/mL    Comment: Performed at [facility] [address] [place], [place] [address]  Triglycerides     Status: None   Collection Time: 04/15/19 10:42 AM  Result Value Ref Range   Triglycerides 110 <150 mg/dL    Comment: Performed at [facility] [address], [place], [place] [address]  Lactic acid, plasma     Status: None   Collection Time: 04/15/19 11:00 AM  Result Value Ref Range   Lactic Acid, Venous 0.9 0.5 - 1.9 mmol/L    Comment: Performed at [facility] [address], [place], [place] [address]   Dg Chest Port 1 View  Result Date: 04/15/2019 CLINICAL DATA:  Weakness.  Chills. Cough. EXAM: PORTABLE CHEST 1 VIEW COMPARISON:  04/20/2018 FINDINGS: Midline trachea. Cardiomegaly accentuated by AP portable technique. No pleural effusion or pneumothorax. No congestive failure. External pacer/defibrillator. Clear lungs. Surgical clips in the left axilla. IMPRESSION: Cardiomegaly without congestive failure. Electronically Signed   By: Abigail Miyamoto M.D.   On: 04/15/2019 11:16    Pending Labs Unresulted Labs (From admission, onward)    Start     Ordered   04/16/19 0500  CBC with Differential/Platelet  Daily,   R     04/15/19 1503   04/16/19 0500  Comprehensive metabolic panel  Daily,   R     04/15/19 1503   04/15/19 1503  HIV antibody (Routine Testing)  Once,   R     04/15/19 1503   04/15/19 1038  Blood Culture (routine x 2)  BLOOD CULTURE X 2,   STAT     04/15/19 1038          Vitals/Pain Today's Vitals   04/15/19 1330 04/15/19 1345 04/15/19 1430 04/15/19 1445  BP:   (!) 128/48   Pulse: (!) 45 (!) 46   (!) 45  Resp: 16 (!) 25  11  Temp:      SpO2: 100% 100% 100% 100%  PainSc:        Isolation Precautions Droplet and Contact precautions  Medications Medications  anastrozole (ARIMIDEX) tablet 1 mg (has no administration in time range)  sacubitril-valsartan (ENTRESTO) 24-26 mg per tablet (has no administration in time range)  ALPRAZolam (XANAX) tablet 0.5 mg (has no administration in time range)  DULoxetine (CYMBALTA) DR capsule 30 mg (has no administration in time range)  nortriptyline (PAMELOR) capsule 25 mg (has no administration in time range)  pantoprazole (PROTONIX) EC tablet 40 mg (has no administration in time range)  gabapentin (NEURONTIN) capsule 300 mg (has no administration in time range)  loratadine (CLARITIN) tablet 10 mg (has no administration in time range)  fluticasone (FLONASE) 50 MCG/ACT nasal spray 1 spray (has no administration in time range)  enoxaparin (LOVENOX) injection 40 mg (has no administration in time range)  acetaminophen (TYLENOL) tablet 650 mg (has no administration in time range)  oxyCODONE (Oxy IR/ROXICODONE) immediate release tablet 5 mg (has no administration in time range)  docusate sodium (COLACE) capsule 100 mg (has no administration in time range)  polyethylene glycol (MIRALAX / GLYCOLAX) packet 17 g (has no administration in time range)  bisacodyl (DULCOLAX) EC tablet 5 mg (has no administration in time range)  ondansetron (ZOFRAN) tablet 4 mg (has no administration in time range)    Or  ondansetron (ZOFRAN) injection 4 mg (has no administration in time range)  sodium chloride flush (NS) 0.9 % injection 3 mL (has no administration in time range)    Mobility walks High fall risk   Focused Assessments Pulmonary Assessment Handoff:  Lung sounds: Bilateral Breath Sounds: Clear L Breath Sounds: Clear R Breath Sounds: Clear O2 Device: Room Air        R Recommendations: See Admitting Provider Note  Report given to:   Additional  Notes:  COVID positive, on 2L [place] at 100%.

## 2019-04-15 NOTE — H&P (Signed)
History and Physical    Meagan Ryan SUP:[id number] DOB: Feb 20, 1956 DOA: 04/15/2019  PCP: Hulan Fess, MD Consultants:  [facility] Medico - oncology; Little - ENT, [place]; Turner - cardiology Patient coming from:  Home - lives with daughter, son-in-law, grand-daughter; NOK: Daughter, [phone]  Chief Complaint: Cough, weakness  HPI: Meagan Ryan is a 63 y.o. female with medical history significant of NICM on Coreg and Corlanor;  OSA; HTN; HLD; and breast cancer with recurrence presenting with cough.  She reports that she has had several months of intermittent weakness.  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  Her COVID symptoms are mostly improved, but her weakness has progressed to the point that she could not even get out of bed without assistance this AM.  She has a h/o NICM but has not had medication changes in a couple of years.   ED Course:  COVID patient.  Complicated by heart block.  Cardiology consulted, recommends staying here.  Possibly related to medications.  Dr. Aundra Dubin suggests staying here.  Dr. Tomi Bamberger has spoken to the family.  Review of Systems: As per HPI; otherwise review of systems reviewed and negative.     Past Medical History:  Diagnosis Date  . Allergic rhinitis   . Asthma   . Breast cancer ([place]) 1996   Left breast-stage II infiltrating ductal carcinoma, 3/8 axillary lymph nodes were positive for metastatic carcinoma, getting yearly mammogram and every other year MRI followed by oncologist Dr Benay Spice  . Complication of anesthesia    WAKE UP WITH HEADACHE, difficulty waking up  . DCM (dilated cardiomyopathy) ([place])    EF normalized at 60-65% on echo 07/2016  . Dysuria 2011  . Family history of adverse reaction to anesthesia    "family wakes up with personality changes, but are very short term"  . Fatty liver  CT 1/12   elevated LFT  . Genetic testing 03/13/2018   Common cancers panel (47 genes) @ [facility] - No pathogenic mutations detected  . H/O fibromyalgia   . H/O osteopenia 2009  . H/O peripheral neuropathy   . H/O varicose veins   . Hyperlipidemia   . Hypertension   . Incomplete RBBB   . 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 OSA (obstructive sleep apnea) 12/31/2015   Very mild with AHI 6/hr but elevated RDI at 32/hr.  Intolerant to PAP therapy  . Pelvic pain in female   . PMB (postmenopausal bleeding) 2011  . Pregnancy induced hypertension   . Right-sided lacunar infarction ([place])    MRI 1.2012  . Tubular adenoma of colon     Past Surgical History:  Procedure Laterality Date  . Gastroc muscle repair  2007  . LAPAROSCOPIC CHOLECYSTECTOMY    . MASTECTOMY  06/1995   Left - for infiltrating ductal carcinoma  . MASTECTOMY W/ SENTINEL NODE BIOPSY Right 03/31/2018  . MASTECTOMY W/ SENTINEL NODE BIOPSY Right 03/31/2018   Procedure: RIGHT TOTAL MASTECTOMY WITH AXILLARY SENTINEL LYMPH NODE BIOPSY ERAS PATHWAY;  Surgeon: Excell Seltzer, MD;  Location: [place];  Service: General;  Laterality: Right;  . 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 ROBOTIC ASSISTED TOTAL HYSTERECTOMY WITH BILATERAL SALPINGO OOPHERECTOMY Bilateral 10/09/2015   Procedure: ROBOTIC ASSISTED BILATERAL SALPINGO OOPHORECTOMY ;  Surgeon: Everitt Amber, MD;  Location: [facility];  Service: Gynecology;  Laterality: Bilateral;  . Tram surgery  1996  . TUBAL LIGATION      Social History   Socioeconomic History  . Marital status: Widowed    Spouse name: Not on file  . Number of children: Not on file  . Years of education: Not on file  . Highest education level: Not on file  Occupational History  . Not on file  Social Needs  . Financial resource strain: Not on file  . Food insecurity:    Worry: Not on file    Inability: Not on file  . Transportation needs:    Medical: Not on file    Non-medical: Not  on file  Tobacco Use  . Smoking status: Never Smoker  . Smokeless tobacco: Never Used  Substance and Sexual Activity  . Alcohol use: No  . Drug use: No  . Sexual activity: Not on file  Lifestyle  . Physical activity:    Days per week: Not on file    Minutes per session: Not on file  . Stress: Not on file  Relationships  . Social connections:    Talks on phone: Not on file    Gets together: Not on file    Attends religious service: Not on file    Active member of club or organization: Not on file    Attends meetings of clubs or organizations: Not on file    Relationship status: Not on file  . Intimate partner violence:    Fear of current or ex partner: Not on file    Emotionally abused: Not on file    Physically abused: Not on file    Forced sexual activity: Not on file  Other Topics Concern  . 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 Wears Ear plugs   Marital Status: Widowed   1 daughter    Allergies  Allergen Reactions  . Amlodipine Shortness Of Breath  . Noroxin [Norfloxacin] Shortness Of Breath  . Cephalexin Other (See Comments) and Nausea And Vomiting    Fever got to 105 High temperature  . Chocolate     Migraines   . Codeine Nausea And Vomiting  . Sulfa Antibiotics Nausea And Vomiting  . Latex Itching and Rash    redness  . Penicillins Rash    Has patient had a PCN reaction causing immediate rash, facial/tongue/throat swelling, SOB or lightheadedness with hypotension:No Has patient had a PCN reaction causing severe rash involving mucus membranes or skin necrosis: Yes Has patient had a PCN reaction that required hospitalization No Has patient had a PCN reaction occurring within the last 10 years: No If all of the above answers are "NO", then may proceed with  Cephalosporin use.     Family History  Problem Relation Age of Onset  . Heart disease Mother        MI  . Stroke Mother   . Diabetes Mother   . Hypertension Mother   . Stroke Father   . Prostate cancer Father 88       deceased 65  . Colon cancer Sister 58       currently 84  . Breast cancer Paternal Aunt 67       deceased 43s  . Breast cancer  Cousin        daughter of pat aunt w/ breast ca  . Breast cancer Cousin        daughter of pat aunt w/ breast ca    Prior to Admission medications   Medication Sig Start Date End Date Taking? Authorizing Provider  ALPRAZolam Duanne Moron) 0.5 MG tablet Take 0.5 mg by mouth 2 (two) times daily.     [provider]  anastrozole (ARIMIDEX) 1 MG tablet Take 1 tablet (1 mg total) by mouth daily. 06/02/18   Alla Feeling, NP  azelastine (ASTELIN) 0.1 % nasal spray Place 1 spray into both nostrils 2 (two) times daily as needed for rhinitis or allergies.  07/21/15   [provider]  carvedilol (COREG) 3.125 MG tablet Take 1 tablet (3.125 mg total) by mouth 2 (two) times daily with a meal. Please keep upcoming appt with Dr. Radford Pax. Thank you 03/23/19   Sueanne Margarita, MD  cetirizine (ZYRTEC) 10 MG tablet Take 10 mg by mouth every morning.     [provider]  cholecalciferol (VITAMIN D3) 25 MCG (1000 UT) tablet Take 2,000 Units by mouth daily.    [provider]  DULoxetine (CYMBALTA) 30 MG capsule Take 30 mg by mouth at bedtime.     [provider]  fluticasone (FLONASE) 50 MCG/ACT nasal spray Place 1 spray into both nostrils every morning.  06/26/15   [provider]  gabapentin (NEURONTIN) 300 MG capsule Take 300 mg by mouth 2 (two) times daily. 02/14/16   [provider]  ibuprofen (ADVIL,MOTRIN) 200 MG tablet Take 600 mg by mouth daily as needed for moderate pain.    [provider]  ivabradine (CORLANOR) 5 MG TABS tablet Take 1 tablet (5 mg total) by mouth 2 (two) times daily with a  meal. 04/10/19   Turner, Eber Hong, MD  Multiple Vitamins-Minerals (AIRBORNE GUMMIES) CHEW Chew 2 tablets by mouth daily.    [provider]  niacin 500 MG tablet Take 500 mg by mouth daily.    [provider]  nortriptyline (PAMELOR) 25 MG capsule Take 25 mg by mouth at bedtime as needed for sleep.     [provider]  Omega-3 Fatty Acids (FISH OIL) 1000 MG CAPS Take 2,000 mg by mouth 2 (two) times daily.    [provider]  omeprazole (PRILOSEC) 20 MG capsule Take 20 mg by mouth daily. 12/29/15   [provider]  potassium chloride (K-DUR) 10 MEQ tablet Take 10 mEq by mouth daily.    [provider]  sacubitril-valsartan (ENTRESTO) 24-26 MG Take 1 tablet by mouth 2 (two) times daily. 04/10/19   Sueanne Margarita, MD    Physical Exam: Vitals:   04/15/19 1330 04/15/19 1345 04/15/19 1430 04/15/19 1445  BP:   (!) 128/48   Pulse: (!) 45 (!) 46  (!) 45  Resp: 16 (!) 25  11  Temp:      SpO2: 100% 100% 100% 100%     . General:  Appears calm and comfortable and is NAD . Eyes:  PERRL, EOMI, normal lids, iris . ENT:  grossly normal hearing, lips & tongue, mmm; appropriate dentition . Neck:  no LAD, masses or thyromegaly; no carotid bruits . Cardiovascular:  RRR, no m/r/g. No LE edema.  Marland Kitchen Respiratory:   CTA bilaterally with no wheezes/rales/rhonchi.  Normal respiratory effort. . Abdomen:  soft, NT, ND, NABS . Back:   normal alignment, no CVAT . Skin:  no rash  or induration seen on limited exam . Musculoskeletal:  grossly normal tone BUE/BLE, good ROM, no bony abnormality . Lower extremity:  No LE edema.  Limited foot exam with no ulcerations.  2+ distal pulses. Marland Kitchen Psychiatric:  grossly normal mood and affect, speech fluent and appropriate, AOx3 . Neurologic:  CN 2-12 grossly intact, moves all extremities in coordinated fashion, sensation intact    Radiological Exams on Admission: Dg Chest Port 1 View  Result Date: 04/15/2019 CLINICAL DATA:   Weakness.  Chills. Cough. EXAM: PORTABLE CHEST 1 VIEW COMPARISON:  04/20/2018 FINDINGS: Midline trachea. Cardiomegaly accentuated by AP portable technique. No pleural effusion or pneumothorax. No congestive failure. External pacer/defibrillator. Clear lungs. Surgical clips in the left axilla. IMPRESSION: Cardiomegaly without congestive failure. Electronically Signed   By: Abigail Miyamoto M.D.   On: 04/15/2019 11:16    EKG: Independently reviewed.   1024 - Advanced heart block with rate 35; RBBB, LVH with IVCD; prolonged QT 521; nonspecific ST changes with no evidence of acute ischemia 1033 -Advanced heart block with rate 34; RBBB, LVH with IVCD; prolonged QT 521; nonspecific ST changes with no evidence of acute ischemia   Labs on Admission: I have personally reviewed the available labs and imaging studies at the time of the admission.  Pertinent labs:   Glucose 104 Albumin 2.8 BNP 142.9 LDH 177 Troponin <0.03 Ferritin 58 CRP 4.7 Lactate 0.9 Procalcitonin <0.10 Normal CBC D-dimer 0.95 Fibrinogen 405 COVID POSITIVE Blood cultures pending   Assessment/Plan Principal Problem:   COVID-19 virus infection Active Problems:   DCM (dilated cardiomyopathy) (HCC)   Benign essential HTN   History of breast cancer in female   Symptomatic advanced heart block   COVID-19 infection -Patient with known diagnosis of covid, with fairly mild and improving symptoms -However, she has had significant progressive weakness that is becoming debilitating -She does not have a usual home O2 requirement and is not currently requiring [place] O2 -Pertinent labs concerning for COVID include leukopenia/lymphopenia; increased BUN/Creatinine; normal WBC count; low procalcitonin; elevated D-dimer (but not >1); elevated CRP (but not >7) -CXR not concerning for COVID-associated respiratory disease -Will not treat with broad-spectrum antibiotics given procalcitonin <0.1 -Will admit to [facility] for further evaluation, close  monitoring, and treatment for heart block since this is her most pressing issue -At this time, will attempt to avoid use of aerosolized medications and use HFAs instead -Will check daily labs including BMP; LFTs; CBC with differential -Will not give medications for COVID given lack of elevated LDH, no PNA on CXR, and mild and improving infection -If the patient shows clinical deterioration, consider transfer to ICU with PCCM consultation -Will attempt to maintain euvolemia to a net negative fluid status -Will ask the patient to maintain an awake prone position for 16+ hours a day, if possible, with a minimum of 2-3 hours at a time -With D-dimer <5, will use standard-dosed Lovenox for DVT prevention -Patient was seen wearing full PPE including: gown, gloves, head cover, N95, and face shield; donning and doffing was in compliance with current standards.  Symptomatic advanced heart block -Patient has advanced heart block on EKG and HR consistently 30-40s -This is likely the source of her severe fatigue and weakness -I discussed the patient with Dr. Domenic Polite.  Has h/o NICM and could have underlying conduction system disease.   -Will err on the side of caution and monitor in PCU at [facility] rather than at [facility].   -Both Coreg and Corlanor can contribute to bradycardia and will be  held -COVID infection can also cause advanced heart blocks -For now, will monitor off medications and without treatment for COVID (as above) and see if this improves -If not, she may require a pacemaker -Cardiology is following -Echo requested  Dilated CM -Prior echo with EF as low as 30-35% but normalized to 50-55% on last echo in 2/19 -She does have grade 2 diastolic CHF -Appears to be compensated at this time -Holding medications, as above -I think it is reasonable to continue Entresto for now  HTN -Continue Entresto -Hold Coreg  H/o breast CA -h/o stage 2 infiltrating L ductal CA s/p L mastectomy -Subsequently  with stage 1A R invasive ductal CA s/p R mastectomy in 5/19 -Continue anastrazole   DVT prophylaxis:  Lovenox  Code Status:  Full - confirmed with patient Family Communication: None present; Dr. Tomi Bamberger spoke with the patient's daughter by telephone. Disposition Plan:  Home once clinically improved Consults called: Cardiology Admission status: Admit - It is my clinical opinion that admission to INPATIENT is reasonable and necessary because of the expectation that this patient will require hospital care that crosses at least 2 midnights to treat this condition based on the medical complexity of the problems presented.  Given the aforementioned information, the predictability of an adverse outcome is felt to be significant.       Karmen Bongo MD [facility]   How to contact the [facility] Attending or Consulting provider [place] or covering provider during after hours [place], for this patient?  1. Check the care team in [facility] and look for a) attending/consulting [facility] provider listed and b) the [facility] team listed 2. Log into [email or web address] and use [facility]'s universal password to access. If you do not have the password, please contact the hospital operator. 3. Locate the [facility] provider you are looking for under [facility] and page to a number that you can be directly reached. 4. If you still have difficulty reaching the provider, please page the [facility] (Director on Call) for the Hospitalists listed on amion for assistance.   04/15/2019, 3:34 PM

## 2019-04-15 NOTE — Consult Note (Signed)
Cardiology Consultation:   Patient ID: Meagan Ryan; [id number]; 04/22/1956   Admit date: 04/15/2019 Date of Consult: 04/15/2019  Primary Care Provider: Hulan Fess, MD Primary Cardiologist: Fransico Him, MD   Patient Profile:   Meagan Ryan is a 63 y.o. female with a history of dilated cardiomyopathy in the setting of previous chemotherapy and hypertension, breast cancer, and hyperlipidemia who is being evaluated today for bradycardia and heart block at the request of Dr. Tomi Bamberger.  Patient presents with symptomatic SARS coronavirus 2 with isolation parameters in place.  Consultation was performed via chart review and phone discussion with the patient as video technology was not available at that time.  History of Present Illness:   Ms. Meagan Ryan presented to the ER today complaining of profound weakness associated with shortness of breath and recent temperature elevation.  She states that she has felt weak for the last 2 to 3 months although much worse in the last week.  She states that her legs are incredibly weak, she has difficulty getting up out of bed and sometimes falls down when she stands up.  She reports a temperature of 100.3 degrees approximately 1 week ago.  Her daughter, son-in-law, and granddaughter all reportedly have had SARS coronavirus 2 and live in the same household.  She has a history of dilated cardiomyopathy in the setting of previous chemotherapy for breast cancer with LVEF as low as 30 to 35%, however improved to 50 to 55% range by echocardiogram in February 2019.  She has continued on medical therapy and had a recent telehealth encounter with Dr. Radford Pax on May 22.  Cardiology is consulted due to presentation with bradycardia and heart rate in the 30s in the setting of above.  I personally reviewed her ECGs as detailed below.  She has evidence of at least second-degree type II heart block, cannot exclude intermittent higher degree block, but there are times where her  conduction actually improves.  She is on Coreg at home, also Corlanor although this would not be expected to affect AV nodal conduction.  Troponin I level is negative, BNP mildly increased at 143.  She is SARS coronavirus 2 postive.  Chest x-ray shows cardiomegaly but no infiltrates.  Past Medical History:  Diagnosis Date  . Allergic rhinitis   . Asthma   . Breast cancer ([place]) 1996   Left breast-stage II infiltrating ductal carcinoma, 3/8 axillary lymph nodes were positive for metastatic carcinoma, getting yearly mammogram and every other year MRI followed by oncologist Dr Benay Spice  . Complication of anesthesia    WAKE UP WITH HEADACHE, difficulty waking up  . DCM (dilated cardiomyopathy) ([place])    EF normalized at 60-65% on echo 07/2016  . Dysuria 2011  . Family history of adverse reaction to anesthesia    "family wakes up with personality changes, but are very short term"  . Family history of breast cancer   . Fatty liver CT 1/12   elevated LFT  . Genetic testing 03/13/2018   Common cancers panel (47 genes) @ [facility] - No pathogenic mutations detected  . H/O actinic keratosis   . H/O allergic rhinitis   . H/O fibromyalgia   . H/O osteopenia 2009  . H/O peripheral neuropathy   . H/O varicose veins   . History of breast cancer   . History of IBS   . Hx of colonic polyps   . Hx of migraines   . Hx of ovarian cyst   . Hyperlipidemia   .  Hypertension   . Incomplete RBBB   . 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 OSA (obstructive sleep apnea) 12/31/2015   Very mild with AHI 6/hr but elevated RDI at 32/hr.  Intolerant to PAP therapy  . Pelvic pain in female   . PMB (postmenopausal bleeding) 2011  . Pregnancy induced hypertension   . Right-sided lacunar infarction ([place])    MRI 1.2012  . Tubular adenoma of colon     Past Surgical History:  Procedure Laterality Date  . Gastroc muscle repair  2007  . LAPAROSCOPIC CHOLECYSTECTOMY    . MASTECTOMY  06/1995   Left - for  infiltrating ductal carcinoma  . MASTECTOMY W/ SENTINEL NODE BIOPSY Right 03/31/2018  . MASTECTOMY W/ SENTINEL NODE BIOPSY Right 03/31/2018   Procedure: RIGHT TOTAL MASTECTOMY WITH AXILLARY SENTINEL LYMPH NODE BIOPSY ERAS PATHWAY;  Surgeon: Excell Seltzer, MD;  Location: [facility];  Service: General;  Laterality: Right;  . 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 ROBOTIC ASSISTED TOTAL HYSTERECTOMY WITH BILATERAL SALPINGO OOPHERECTOMY Bilateral 10/09/2015   Procedure: ROBOTIC ASSISTED BILATERAL SALPINGO OOPHORECTOMY ;  Surgeon: Everitt Amber, MD;  Location: [facility];  Service: Gynecology;  Laterality: Bilateral;  . Tram surgery  1996  . TUBAL LIGATION       Outpatient Medications: No current facility-administered medications on file prior to encounter.    Current Outpatient Medications on File Prior to Encounter  Medication Sig Dispense Refill  . ALPRAZolam (XANAX) 0.5 MG tablet Take 0.5 mg by mouth 2 (two) times daily.     Marland Kitchen anastrozole (ARIMIDEX) 1 MG tablet Take 1 tablet (1 mg total) by mouth daily. 90 tablet 4  . azelastine (ASTELIN) 0.1 % nasal spray Place 1 spray into both nostrils 2 (two) times daily as needed for rhinitis or allergies.     . carvedilol (COREG) 3.125 MG tablet Take 1 tablet (3.125 mg total) by mouth 2 (two) times daily with a meal. Please keep upcoming appt with Dr. Radford Pax. Thank you 180 tablet 0  . cetirizine (ZYRTEC) 10 MG tablet Take 10 mg by mouth every morning.     . cholecalciferol (VITAMIN D3) 25 MCG (1000 UT) tablet Take 2,000 Units by mouth daily.    . DULoxetine (CYMBALTA) 30 MG capsule Take 30 mg by mouth at bedtime.     . fluticasone (FLONASE) 50 MCG/ACT nasal spray Place 1 spray into both nostrils every morning.     . gabapentin (NEURONTIN) 300 MG capsule Take 300 mg by mouth 2 (two) times daily.    Marland Kitchen ibuprofen (ADVIL,MOTRIN) 200 MG tablet Take 600 mg by mouth daily as needed for moderate pain.    . ivabradine (CORLANOR) 5 MG TABS tablet Take  1 tablet (5 mg total) by mouth 2 (two) times daily with a meal. 180 tablet 3  . Multiple Vitamins-Minerals (AIRBORNE GUMMIES) CHEW Chew 2 tablets by mouth daily.    . niacin 500 MG tablet Take 500 mg by mouth daily.    . nortriptyline (PAMELOR) 25 MG capsule Take 25 mg by mouth at bedtime as needed for sleep.     . Omega-3 Fatty Acids (FISH OIL) 1000 MG CAPS Take 2,000 mg by mouth 2 (two) times daily.    Marland Kitchen omeprazole (PRILOSEC) 20 MG capsule Take 20 mg by mouth daily.    . potassium chloride (K-DUR) 10 MEQ tablet Take 10 mEq by mouth daily.    . sacubitril-valsartan (ENTRESTO) 24-26 MG Take 1  tablet by mouth 2 (two) times daily. 180 tablet 3    Allergies:    Allergies  Allergen Reactions  . Amlodipine Shortness Of Breath  . Noroxin [Norfloxacin] Shortness Of Breath  . Cephalexin Other (See Comments) and Nausea And Vomiting    Fever got to 105 High temperature  . Chocolate     Migraines   . Codeine Nausea And Vomiting  . Sulfa Antibiotics Nausea And Vomiting  . Latex Itching and Rash    redness  . Penicillins Rash    Has patient had a PCN reaction causing immediate rash, facial/tongue/throat swelling, SOB or lightheadedness with hypotension:No Has patient had a PCN reaction causing severe rash involving mucus membranes or skin necrosis: Yes Has patient had a PCN reaction that required hospitalization No Has patient had a PCN reaction occurring within the last 10 years: No If all of the above answers are "NO", then may proceed with Cephalosporin use.     Social History:   Social History   Socioeconomic History  . Marital status: Widowed    Spouse name: Not on file  . Number of children: Not on file  . Years of education: Not on file  . Highest education level: Not on file  Occupational History  . Not on file  Social Needs  . Financial resource strain: Not on file  . Food insecurity:    Worry: Not on file    Inability: Not on file  . Transportation needs:    Medical:  Not on file    Non-medical: Not on file  Tobacco Use  . Smoking status: Never Smoker  . Smokeless tobacco: Never Used  Substance and Sexual Activity  . Alcohol use: No  . Drug use: No  . Sexual activity: Not on file  Lifestyle  . Physical activity:    Days per week: Not on file    Minutes per session: Not on file  . Stress: Not on file  Relationships  . Social connections:    Talks on phone: Not on file    Gets together: Not on file    Attends religious service: Not on file    Active member of club or organization: Not on file    Attends meetings of clubs or organizations: Not on file    Relationship status: Not on file  . Intimate partner violence:    Fear of current or ex partner: Not on file    Emotionally abused: Not on file    Physically abused: Not on file    Forced sexual activity: Not on file  Other Topics Concern  . 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 Wears Ear plugs   Marital Status: Widowed   1 daughter    Family History:   The patient's family history includes Breast cancer in her cousin and cousin; Breast cancer (age of onset: 42) in her paternal aunt; Colon cancer (age of onset: 65) in her sister; Diabetes in her mother; Heart disease in her mother; Hypertension in her mother; Prostate cancer (age of onset: 68) in her father; Stroke in her father and mother.  ROS:  Please see the history of present illness.  All other ROS reviewed and negative.     Physical Exam/Data:   Vitals:   04/15/19  1245 04/15/19 1300 04/15/19 1315 04/15/19 1330  BP:      Pulse: (!) 36 (!) 30 (!) 44 (!) 45  Resp: 16 (!) 23 (!) 22 16  Temp:      SpO2: 100% 100% 100% 100%   No intake or output data in the 24 hours ending 04/15/19 1353 There were no vitals filed for this visit.  There is no height or weight on file to calculate BMI.   Patient spoke in full sentences on the phone, not obviously short of breath. No audible wheezing or coughing noted. Speech pattern was normal.  EKG:  I personally reviewed the tracings from today which show sinus rhythm with IVCD/left anterior fascicular block, deep inferior and anterolateral T wave inversions with prolonged QT interval, and most likely second-degree type II heart block although intermittent higher degree heart block cannot be completely excluded.  Telemetry:  I personally reviewed telemetry which shows this rhythm with probable second-degree type II heart block, occasionally with improved conduction.  Relevant CV Studies:  Echocardiogram 12/14/2017: Study Conclusions  - Left ventricle: The cavity size was normal. Wall thickness was   normal. Systolic function was normal. The estimated ejection   fraction was in the range of 50% to 55%. Wall motion was normal;   there were no regional wall motion abnormalities. Features are   consistent with a pseudonormal left ventricular filling pattern,   with concomitant abnormal relaxation and increased filling   pressure (grade 2 diastolic dysfunction). - Right atrium: The atrium was mildly dilated.  Laboratory Data:  Chemistry Recent Labs  Lab 04/15/19 1042  NA 135  K 3.7  CL 101  CO2 25  GLUCOSE 104*  BUN 7*  CREATININE 0.95  CALCIUM 8.7*  GFRNONAA >60  GFRAA >60  ANIONGAP 9    Recent Labs  Lab 04/15/19 1042  PROT 6.5  ALBUMIN 2.8*  AST 32  ALT 20  ALKPHOS 74  BILITOT 0.6   Hematology Recent Labs  Lab 04/15/19 1042  WBC 4.5  RBC 4.78  HGB 13.4  HCT 40.4  MCV 84.5  MCH 28.0  MCHC 33.2  RDW 15.0  PLT 189   Cardiac Enzymes Recent Labs  Lab 04/15/19 1042  TROPONINI <0.03   No results for input(s): TROPIPOC in the last 168 hours.  BNP Recent Labs  Lab 04/15/19 1042  BNP 142.9*    DDimer  Recent Labs  Lab 04/15/19 1042  DDIMER  0.95*    Radiology/Studies:  Dg Chest Port 1 View  Result Date: 04/15/2019 CLINICAL DATA:  Weakness.  Chills. Cough. EXAM: PORTABLE CHEST 1 VIEW COMPARISON:  04/20/2018 FINDINGS: Midline trachea. Cardiomegaly accentuated by AP portable technique. No pleural effusion or pneumothorax. No congestive failure. External pacer/defibrillator. Clear lungs. Surgical clips in the left axilla. IMPRESSION: Cardiomegaly without congestive failure. Electronically Signed   By: Abigail Miyamoto M.D.   On: 04/15/2019 11:16    Assessment and Plan:   1.  Symptomatic bradycardia with second-degree type II heart block and possibly higher degree intermittent heart block, ECGs reviewed above.  Onset uncertain and complicated by concurrent diagnosis of SARS coronavirus 2.  She is also on Coreg.  Last LVEF was 50 to 55% by echocardiogram in February 2019, but she does have a prior documented history of dilated cardiomyopathy.  T wave inversions and prolonged QT interval are new.  Troponin I is negative.  2.  Weakness, shortness of breath and low-grade fevers with SARS coronavirus 2 sick contacts in household and now  patient testing positive for SARS coronavirus 2.  3.  Essential hypertension.  Blood pressure is normal at this time.  4.  Diet managed hyperlipidemia.  Patient is being admitted for management of symptomatic SARS coronavirus 2, on isolation protocol.  I discussed the case by phone with Dr. Aundra Dubin, would recommend admitting her to [facility] in case heart block requires more urgent evaluation such as a temporary wire.  There is not a strong indication to place one at this time however, and she is not a candidate for a permanent pacemaker as yet.  Heart block can be associated with coronavirus and she will need to be followed closely on telemetry.  Would obviously stop her Coreg as well, and although Corlanor would not be expected to affect AV nodal conduction, I would stop this as well to get it out of the  mix particularly since her last documented LVEF was low normal.  Echocardiogram should be repeated.  Our service will continue to follow with you and can make further recommendations.  Signed, Rozann Lesches, MD  04/15/2019 1:53 PM

## 2019-04-15 NOTE — ED Triage Notes (Addendum)
Pt arrives to ED from home with complaints of weakness, fevers, chills, cough, and loss of taste. When EMS arrived pts HR was in the 30's and pts EKG showed heart block. Pt stated she has heart damage from her chemotherapy for hx of breast cancer. Pts daughter was tested positive for COVID19 last week.

## 2019-04-16 ENCOUNTER — Encounter (HOSPITAL_COMMUNITY)
Admission: EM | Disposition: A | Discharge status: Home / Self Care | Payer: Self-pay | Source: Home / Self Care | Attending: Internal Medicine

## 2019-04-16 ENCOUNTER — Inpatient Hospital Stay (HOSPITAL_COMMUNITY)

## 2019-04-16 DIAGNOSIS — I441 Atrioventricular block, second degree: Secondary | ICD-10-CM

## 2019-04-16 DIAGNOSIS — R9431 Abnormal electrocardiogram [ECG] [EKG]: Secondary | ICD-10-CM

## 2019-04-16 DIAGNOSIS — U071 COVID-19: Secondary | ICD-10-CM

## 2019-04-16 HISTORY — PX: PACEMAKER IMPLANT: EP1218

## 2019-04-16 LAB — COMPREHENSIVE METABOLIC PANEL
ALT: 19 U/L (ref 0–44)
AST: 31 U/L (ref 15–41)
Albumin: 2.9 g/dL — ABNORMAL LOW (ref 3.5–5.0)
Alkaline Phosphatase: 76 U/L (ref 38–126)
Anion gap: 10 (ref 5–15)
BUN: 7 mg/dL — ABNORMAL LOW (ref 8–23)
CO2: 24 mmol/L (ref 22–32)
Calcium: 8.9 mg/dL (ref 8.9–10.3)
Chloride: 98 mmol/L (ref 98–111)
Creatinine, Ser: 0.98 mg/dL (ref 0.44–1.00)
GFR calc Af Amer: >60 mL/min (ref >60)
GFR calc non Af Amer: >60 mL/min (ref >60)
Glucose, Bld: 130 mg/dL — ABNORMAL HIGH (ref 70–99)
Potassium: 3.7 mmol/L (ref 3.5–5.1)
Sodium: 132 mmol/L — ABNORMAL LOW (ref 135–145)
Total Bilirubin: 0.7 mg/dL (ref 0.3–1.2)
Total Protein: 6.5 g/dL (ref 6.5–8.1)

## 2019-04-16 LAB — CBC WITH DIFFERENTIAL/PLATELET
Abs Immature Granulocytes: 0.01 10*3/uL (ref 0.00–0.07)
Basophils Absolute: 0 10*3/uL (ref 0.0–0.1)
Basophils Relative: 1 %
Eosinophils Absolute: 0.1 10*3/uL (ref 0.0–0.5)
Eosinophils Relative: 2 %
HCT: 40.3 % (ref 36.0–46.0)
Hemoglobin: 13.2 g/dL (ref 12.0–15.0)
Immature Granulocytes: 0 %
Lymphocytes Relative: 41 %
Lymphs Abs: 1.5 10*3/uL (ref 0.7–4.0)
MCH: 27.8 pg (ref 26.0–34.0)
MCHC: 32.8 g/dL (ref 30.0–36.0)
MCV: 85 fL (ref 80.0–100.0)
Monocytes Absolute: 0.3 10*3/uL (ref 0.1–1.0)
Monocytes Relative: 8 %
Neutro Abs: 1.8 10*3/uL (ref 1.7–7.7)
Neutrophils Relative %: 48 %
Platelets: 172 10*3/uL (ref 150–400)
RBC: 4.74 MIL/uL (ref 3.87–5.11)
RDW: 14.7 % (ref 11.5–15.5)
WBC: 3.7 10*3/uL — ABNORMAL LOW (ref 4.0–10.5)
nRBC: 0 % (ref 0.0–0.2)

## 2019-04-16 LAB — SURGICAL PCR SCREEN
MRSA, PCR: NEGATIVE
Staphylococcus aureus: NEGATIVE

## 2019-04-16 LAB — ECHOCARDIOGRAM LIMITED

## 2019-04-16 SURGERY — PACEMAKER IMPLANT

## 2019-04-16 MED ORDER — VANCOMYCIN HCL 10 G IV SOLR
1500.0000 mg | Freq: Once | INTRAVENOUS | Status: AC
  Administered 2019-04-17: 1500 mg via INTRAVENOUS
  Filled 2019-04-16: qty 1500

## 2019-04-16 MED ORDER — VANCOMYCIN HCL 10 G IV SOLR
1500.0000 mg | INTRAVENOUS | Status: AC
  Administered 2019-04-16: 1500 mg via INTRAVENOUS
  Filled 2019-04-16: qty 1500

## 2019-04-16 MED ORDER — SODIUM CHLORIDE 0.9 % IV SOLN
INTRAVENOUS | Status: AC
  Filled 2019-04-16: qty 2

## 2019-04-16 MED ORDER — LIDOCAINE HCL (PF) 1 % IJ SOLN
INTRAMUSCULAR | Status: DC | PRN
  Administered 2019-04-16: 110 mL

## 2019-04-16 MED ORDER — SODIUM CHLORIDE 0.9 % IV SOLN
80.0000 mg | INTRAVENOUS | Status: AC
  Administered 2019-04-16: 80 mg
  Filled 2019-04-16 (×2): qty 2

## 2019-04-16 MED ORDER — SODIUM CHLORIDE 0.9 % IV SOLN: 250.0000 mL | INTRAVENOUS | Status: DC

## 2019-04-16 MED ORDER — VANCOMYCIN HCL IN DEXTROSE 1-5 GM/200ML-% IV SOLN: 1000.0000 mg | Freq: Two times a day (BID) | INTRAVENOUS | Status: DC

## 2019-04-16 MED ORDER — SODIUM CHLORIDE 0.9 % IV SOLN: INTRAVENOUS | Status: DC

## 2019-04-16 MED ORDER — LIDOCAINE HCL (PF) 1 % IJ SOLN
INTRAMUSCULAR | Status: AC
  Filled 2019-04-16: qty 60

## 2019-04-16 MED ORDER — HEPARIN (PORCINE) IN NACL 1000-0.9 UT/500ML-% IV SOLN
INTRAVENOUS | Status: AC
  Filled 2019-04-16: qty 500

## 2019-04-16 MED ORDER — VANCOMYCIN HCL 10 G IV SOLR
1500.0000 mg | Freq: Once | INTRAVENOUS | Status: DC
  Filled 2019-04-16: qty 1500

## 2019-04-16 MED ORDER — HEPARIN (PORCINE) IN NACL 1000-0.9 UT/500ML-% IV SOLN
INTRAVENOUS | Status: DC | PRN
  Administered 2019-04-16: 500 mL

## 2019-04-16 MED ORDER — VANCOMYCIN HCL IN DEXTROSE 1-5 GM/200ML-% IV SOLN
INTRAVENOUS | Status: AC
  Filled 2019-04-16: qty 200

## 2019-04-16 MED ORDER — SODIUM CHLORIDE 0.9% FLUSH
3.0000 mL | Freq: Two times a day (BID) | INTRAVENOUS | Status: DC
  Administered 2019-04-16 (×2): 3 mL via INTRAVENOUS

## 2019-04-16 SURGICAL SUPPLY — 8 items
CABLE SURGICAL S-101-97-12 (CABLE) ×2 IMPLANT
IPG PACE AZUR XT DR MRI W1DR01 (Pacemaker) IMPLANT
LEAD CAPSURE NOVUS 5076-52CM (Lead) ×1 IMPLANT
LEAD CAPSURE NOVUS 5076-58CM (Lead) ×1 IMPLANT
PACE AZURE XT DR MRI W1DR01 (Pacemaker) ×2 IMPLANT
PAD PRO RADIOLUCENT 2001M-C (PAD) ×2 IMPLANT
SHEATH CLASSIC 7F (SHEATH) ×2 IMPLANT
TRAY PACEMAKER INSERTION (PACKS) ×2 IMPLANT

## 2019-04-16 NOTE — Progress Notes (Signed)
  Echocardiogram 2D Echocardiogram Limited  has been performed.  Darlina Sicilian M 04/16/2019, 8:53 AM

## 2019-04-16 NOTE — Progress Notes (Addendum)
PHARMACY NOTE:  ANTIMICROBIAL RENAL/WEIGHT DOSAGE ADJUSTMENT  As per policy approved by the Pharmacy & Therapeutics and Medical Executive Committees, the antimicrobial dosage will be adjusted for pt's weight and renal function. Pt's wt: 108.7 kg.  Current antimicrobial dosage:  Vancomycin 1 gram IV once at 0330 on 6/9  Indication: surgical prophylaxis post-procedure (pt rec'd vancomycin 1500 mg IV X 1 at 1545 on 6/8)  Renal Function:  Estimated Creatinine Clearance: 69.9 mL/min (by C-G formula based on SCr of 0.98 mg/dL). Renal function stable. []      On intermittent HD, scheduled: []      On CRRT    Antimicrobial dosage has been changed to: vancomycin 1500 mg IV X 1 at 2200 6/8 (due to pt's wt >100 kg and CrCl ~ 70 ml/min).   Thank you for allowing pharmacy to be a part of this patient's care.  Gillermina Hu, PharmD, BCPS, [facility] Clinical Pharmacist 04/16/2019 7:48 PM

## 2019-04-16 NOTE — Progress Notes (Signed)
Subjective:  No chest pain just weakness CHB persists with HR in mid 30's Coreg has been d/c  Daughter had Covid and was in same household as patient without much social distancing   Objective:  Vitals:   04/16/19 0000 04/16/19 0200 04/16/19 0222 04/16/19 0400  BP:  (!) 148/45  (!) 158/38  Pulse:  (!) 31 (!) 32 (!) 33  Resp: 20  (!) 21 (!) 22  Temp: 98.3 F (36.8 C)     TempSrc: Oral     SpO2: 99% 100% 99% 99%    Intake/Output from previous day: No intake or output data in the 24 hours ending 04/16/19 0808  Physical Exam:  Affect appropriate Obese white female  HEENT: normal Neck supple with no adenopathy JVP normal no bruits no thyromegaly Lungs clear with no wheezing and good diaphragmatic motion Heart:  S1/S2 no murmur, no rub, gallop or click PMI normal Abdomen: benighn, BS positve, no tenderness, no AAA no bruit.  No HSM or HJR Distal pulses intact with no bruits No edema Neuro non-focal Skin warm and dry No muscular weakness   Lab Results: Basic Metabolic Panel: Recent Labs    04/15/19 1042  NA 135  K 3.7  CL 101  CO2 25  GLUCOSE 104*  BUN 7*  CREATININE 0.95  CALCIUM 8.7*   Liver Function Tests: Recent Labs    04/15/19 1042  AST 32  ALT 20  ALKPHOS 74  BILITOT 0.6  PROT 6.5  ALBUMIN 2.8*   No results for input(s): LIPASE, AMYLASE in the last 72 hours. CBC: Recent Labs    04/15/19 1042  WBC 4.5  NEUTROABS 2.9  HGB 13.4  HCT 40.4  MCV 84.5  PLT 189   Cardiac Enzymes: Recent Labs    04/15/19 1042  TROPONINI <0.03   BNP: Invalid input(s): POCBNP D-Dimer: Recent Labs    04/15/19 1042  DDIMER 0.95*   Hemoglobin A1C: No results for input(s): HGBA1C in the last 72 hours. Fasting Lipid Panel: Recent Labs    04/15/19 1042  TRIG 110   Thyroid Function Tests: No results for input(s): TSH, T4TOTAL, T3FREE, THYROIDAB in the last 72 hours.  Invalid input(s): FREET3 Anemia Panel: Recent Labs    04/15/19 1042   FERRITIN 58    Imaging: Dg Chest Port 1 View  Result Date: 04/15/2019 CLINICAL DATA:  Weakness.  Chills. Cough. EXAM: PORTABLE CHEST 1 VIEW COMPARISON:  04/20/2018 FINDINGS: Midline trachea. Cardiomegaly accentuated by AP portable technique. No pleural effusion or pneumothorax. No congestive failure. External pacer/defibrillator. Clear lungs. Surgical clips in the left axilla. IMPRESSION: Cardiomegaly without congestive failure. Electronically Signed   By: Abigail Miyamoto M.D.   On: 04/15/2019 11:16    Cardiac Studies:  ECG: CHB rate mid 30' lateral T wave inversions    Telemetry: CHB rates 30's   Echo: pending EF 50-55% 12/14/17  Medications:   . ALPRAZolam  0.5 mg Oral BID  . anastrozole  1 mg Oral Daily  . docusate sodium  100 mg Oral BID  . DULoxetine  30 mg Oral QHS  . enoxaparin (LOVENOX) injection  40 mg Subcutaneous Q24H  . feeding supplement (ENSURE ENLIVE)  237 mL Oral BID BM  . fluticasone  1 spray Each Nare q morning - 10a  . gabapentin  300 mg Oral BID  . loratadine  10 mg Oral Daily  . pantoprazole  40 mg Oral Daily  . sacubitril-valsartan  1 tablet Oral BID  . sodium chloride  flush  3 mL Intravenous Q12H      Assessment/Plan:   CHB:  Coreg has been stopped Suspect she will need PPM Have asked EP to see Troponin negative no chest pain. Echo pending K /Cr normal.  Given history of DCM would ideally have cardiac MRI to r/o infiltrative process but would not do given positive COVID status   DCM:  Continue all meds except coreg no signs of active CHF BNP only 142   COVID:  Chest CT not done CXR with no infiltrate per primary service  Jenkins Rouge 04/16/2019, 8:08 AM

## 2019-04-16 NOTE — Consult Note (Addendum)
Cardiology Consultation:   Patient ID: Meagan Ryan MRN: [id number]; DOB: Apr 02, 1956  Admit date: 04/15/2019 Date of Consult: 04/16/2019  Primary Care Provider: Hulan Fess, MD Primary Cardiologist: Dr. Radford Pax Primary Electrophysiologist:  None    Patient Profile:   Meagan Ryan is a 63 y.o. female with a hx of DCM in the setting of chemo (for breast cancer) that had recovered, HTN, HLD,m ild OSA intolerant of CPAP, h/o stroke by MRI who is being seen today for the evaluation of CHB, advanced heart block at the request of Dr. Johnsie Cancel  History of Present Illness:    Much of PMHx and HPI is obtained from chart.   Ms. Epler was apparently noted COVID + 2 weeks ago (with known sick contacts/family members in her home), with reports of low grade temp, malaise, lack of smell/taste with of late some improvement in her symptoms.  There is note of a couple months of generalized fatigue/weakness, though she sought attention with acutely progressive fatigue, weakness, to the point of needing assistance to get oob the day of her admission.  She was found in high degree AVBlock with rates 30's and was admitted  LABS  COVID +  K+ 3.7 BUN/Creat 7/0.98 BNP 142 Trop I: <0.03 Lactic acid 0.9 Procalcitonin <0.10 WBC 4.5, 3.7 H/H 13/40 Plts 172 Ddimer 0.95 (no plans for CT without notable respiratory symptoms, neg CXR, and no O2 requirements)  Home medicines are reviewed, potential contributing medicine, coreg 3.176m BID noted Last dose was the night before her coming in 04/14/2019 at about 2000  I have called into the patient's room for an initial HPI  She tells met 2 weeks or so ago she developed the same symptoms as her daughter cough, wheezing, diarrhea. She says she was out of everyone doing the best, really caring for the others.  She isn't sure that she was getting any better or not, but about a week ago, started to feel more weak, having to take breaks doing things, no SOB, but  physically weak.  No worsening of her cough or any SOB.  The ady she came in, she required assistance in just getting out of bed, was markedly weaker.  AT rest in bed, she is tired, but mentating very well and reports she has ambulated to the bathroom with a walker with fatigue, but able to    Past Medical History:  Diagnosis Date  . Allergic rhinitis   . Asthma   . Breast cancer ([place]   Left breast-stage II infiltrating ductal carcinoma, 3/8 axillary lymph nodes were positive for metastatic carcinoma, getting yearly mammogram and every other year MRI followed by oncologist Dr SBenay Spice . Complication of anesthesia    WAKE UP WITH HEADACHE, difficulty waking up  . DCM [place]    EF normalized at 60-65% on echo 07/2016  . Dysuria 2011  . Family history of adverse reaction to anesthesia    "family wakes up with personality changes, but are very short term"  . Fatty liver CT 1/12   elevated LFT  . Genetic testing 03/13/2018   Common cancers panel (47 genes) @ [facility] - No pathogenic mutations detected  . H/O fibromyalgia   . H/O osteopenia 2009  . H/O peripheral neuropathy   . H/O varicose veins   . Hyperlipidemia   . Hypertension   . Incomplete RBBB   . 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 OSA (obstructive sleep apnea) 12/31/2015  Very mild with AHI 6/hr but elevated RDI at 32/hr.  Intolerant to PAP therapy  . Pelvic pain in female   . PMB (postmenopausal bleeding) 2011  . Pregnancy induced hypertension   . Right-sided lacunar infarction ([place])    MRI 1.2012  . Tubular adenoma of colon     Past Surgical History:  Procedure Laterality Date  . Gastroc muscle repair  2007  . LAPAROSCOPIC CHOLECYSTECTOMY    . MASTECTOMY  06/1995   Left - for infiltrating ductal carcinoma  . MASTECTOMY W/ SENTINEL NODE BIOPSY Right 03/31/2018  . MASTECTOMY W/ SENTINEL NODE BIOPSY Right 03/31/2018   Procedure: RIGHT TOTAL MASTECTOMY WITH AXILLARY SENTINEL LYMPH  NODE BIOPSY ERAS PATHWAY;  Surgeon: Excell Seltzer, MD;  Location: [facility];  Service: General;  Laterality: Right;  . 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 ROBOTIC ASSISTED TOTAL HYSTERECTOMY WITH BILATERAL SALPINGO OOPHERECTOMY Bilateral 10/09/2015   Procedure: ROBOTIC ASSISTED BILATERAL SALPINGO OOPHORECTOMY ;  Surgeon: Everitt Amber, MD;  Location: [facility];  Service: Gynecology;  Laterality: Bilateral;  . Tram surgery  1996  . TUBAL LIGATION       Home Medications:  Prior to Admission medications   Medication Sig Start Date End Date Taking? Authorizing Provider  ALPRAZolam Duanne Moron) 0.5 MG tablet Take 0.5 mg by mouth 2 (two) times daily.     [provider]  anastrozole (ARIMIDEX) 1 MG tablet Take 1 tablet (1 mg total) by mouth daily. 06/02/18   Alla Feeling, NP  azelastine (ASTELIN) 0.1 % nasal spray Place 1 spray into both nostrils 2 (two) times daily as needed for rhinitis or allergies.  07/21/15   [provider]  carvedilol (COREG) 3.125 MG tablet Take 1 tablet (3.125 mg total) by mouth 2 (two) times daily with a meal. Please keep upcoming appt with Dr. Radford Pax. Thank you 03/23/19   Sueanne Margarita, MD  cetirizine (ZYRTEC) 10 MG tablet Take 10 mg by mouth every morning.     [provider]  cholecalciferol (VITAMIN D3) 25 MCG (1000 UT) tablet Take 2,000 Units by mouth daily.    [provider]  DULoxetine (CYMBALTA) 30 MG capsule Take 30 mg by mouth at bedtime.     [provider]  fluticasone (FLONASE) 50 MCG/ACT nasal spray Place 1 spray into both nostrils every morning.  06/26/15   [provider]  gabapentin (NEURONTIN) 300 MG capsule Take 300 mg by mouth 2 (two) times daily. 02/14/16   [provider]  ibuprofen (ADVIL,MOTRIN) 200 MG tablet Take 600 mg by mouth daily as needed for moderate pain.    [provider]  ivabradine (CORLANOR) 5 MG TABS tablet Take 1 tablet (5 mg total) by mouth 2 (two)  times daily with a meal. 04/10/19   Turner, Eber Hong, MD  Multiple Vitamins-Minerals (AIRBORNE GUMMIES) CHEW Chew 2 tablets by mouth daily.    [provider]  niacin 500 MG tablet Take 500 mg by mouth daily.    [provider]  nortriptyline (PAMELOR) 25 MG capsule Take 25 mg by mouth at bedtime as needed for sleep.     [provider]  Omega-3 Fatty Acids (FISH OIL) 1000 MG CAPS Take 2,000 mg by mouth 2 (two) times daily.    [provider]  omeprazole (PRILOSEC) 20 MG capsule Take 20 mg by mouth daily. 12/29/15   [provider]  potassium chloride (K-DUR) 10 MEQ tablet Take 10 mEq by mouth daily.  [provider]  sacubitril-valsartan (ENTRESTO) 24-26 MG Take 1 tablet by mouth 2 (two) times daily. 04/10/19   Sueanne Margarita, MD    Inpatient Medications: Scheduled Meds: . ALPRAZolam  0.5 mg Oral BID  . anastrozole  1 mg Oral Daily  . docusate sodium  100 mg Oral BID  . DULoxetine  30 mg Oral QHS  . enoxaparin (LOVENOX) injection  40 mg Subcutaneous Q24H  . feeding supplement (ENSURE ENLIVE)  237 mL Oral BID BM  . fluticasone  1 spray Each Nare q morning - 10a  . gabapentin  300 mg Oral BID  . loratadine  10 mg Oral Daily  . pantoprazole  40 mg Oral Daily  . sacubitril-valsartan  1 tablet Oral BID  . sodium chloride flush  3 mL Intravenous Q12H   Continuous Infusions:  PRN Meds: acetaminophen, bisacodyl, nortriptyline, ondansetron **OR** ondansetron (ZOFRAN) IV, oxyCODONE, polyethylene glycol  Allergies:    Allergies  Allergen Reactions  . Amlodipine Shortness Of Breath  . Noroxin [Norfloxacin] Shortness Of Breath  . Cephalexin Other (See Comments) and Nausea And Vomiting    Fever got to 105 High temperature  . Chocolate     Migraines   . Codeine Nausea And Vomiting  . Sulfa Antibiotics Nausea And Vomiting  . Latex Itching and Rash    redness  . Penicillins Rash    Has patient had a PCN reaction causing immediate rash,  facial/tongue/throat swelling, SOB or lightheadedness with hypotension:No Has patient had a PCN reaction causing severe rash involving mucus membranes or skin necrosis: Yes Has patient had a PCN reaction that required hospitalization No Has patient had a PCN reaction occurring within the last 10 years: No If all of the above answers are "NO", then may proceed with Cephalosporin use.     Social History:   Social History   Socioeconomic History  . Marital status: Widowed    Spouse name: Not on file  . Number of children: Not on file  . Years of education: Not on file  . Highest education level: Not on file  Occupational History  . Not on file  Social Needs  . Financial resource strain: Not on file  . Food insecurity:    Worry: Not on file    Inability: Not on file  . Transportation needs:    Medical: Not on file    Non-medical: Not on file  Tobacco Use  . Smoking status: Never Smoker  . Smokeless tobacco: Never Used  Substance and Sexual Activity  . Alcohol use: No  . Drug use: No  . Sexual activity: Not on file  Lifestyle  . Physical activity:    Days per week: Not on file    Minutes per session: Not on file  . Stress: Not on file  Relationships  . Social connections:    Talks on phone: Not on file    Gets together: Not on file    Attends religious service: Not on file    Active member of club or organization: Not on file    Attends meetings of clubs or organizations: Not on file    Relationship status: Not on file  . Intimate partner violence:    Fear of current or ex partner: Not on file    Emotionally abused: Not on file    Physically abused: Not on file    Forced sexual activity: Not on file  Other Topics Concern  . 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 Wears Ear  plugs   Marital Status: Widowed   1 daughter    Family History:   Family History  Problem Relation Age of Onset  . Heart disease Mother        MI  . Stroke Mother   . Diabetes Mother   . Hypertension Mother   . Stroke Father   . Prostate cancer Father 75       deceased 70  . Colon cancer Sister 18       currently 67  . Breast cancer Paternal Aunt 93       deceased 69s  . Breast cancer Cousin        daughter of pat aunt w/ breast ca  . Breast cancer Cousin        daughter of pat aunt w/ breast ca     ROS:  Please see the history of present illness.  All other ROS reviewed and negative.     Physical Exam/Data:   Vitals:   04/16/19 0000 04/16/19 0200 04/16/19 0222 04/16/19 0400  BP:  (!) 148/45  (!) 158/38  Pulse:  (!) 31 (!) 32 (!) 33  Resp: 20  (!) 21 (!) 22  Temp: 98.3 F (36.8 C)     TempSrc: Oral     SpO2: 99% 100% 99% 99%   No intake or output data in the 24 hours ending 04/16/19 1137 Last 3 Weights 03/30/2019 10/25/2018 07/25/2018  Weight (lbs) 239 lb 238 lb 1.6 oz 236 lb 3.2 oz  Weight (kg) 108.41 kg 108.001 kg 107.14 kg     There is no height or weight on file to calculate BMI.   Virtual/telephone visit General:  AAO x4 Cardiac:  Telemetry reviewed, pending echo Lungs:  Patient speaking in full sentences, does not sound SOB, no coughing is noted, discussed with RN, off O2 this AM, sats on RA 98-100% Psych:  Normal affect, very pleasent  EKG:  The EKG was personally reviewed and demonstrates:   #1 3;1 conduction, V rate 35, with slight suggestion of CHB, icRBBB, LAD #2 is 3:1 AV block, 34bpm, icRBBB, LAD  03/13/18: SR, 69bpm, icRBBB, LAD  Telemetry:  Telemetry was personally reviewed and demonstrates:   Predominantly a 3:1 conduction, rates generally 30's, some towards 40bpm  Relevant CV Studies:  12/14/2018: limited echo is pending  12/14/17: TTE Study Conclusions - Left ventricle: The cavity size was normal. Wall thickness was   normal. Systolic  function was normal. The estimated ejection   fraction was in the range of 50% to 55%. Wall motion was normal;   there were no regional wall motion abnormalities. Features are   consistent with a pseudonormal left ventricular filling pattern,   with concomitant abnormal relaxation and increased filling   pressure (grade 2 diastolic dysfunction). - Right atrium: The atrium was mildly dilated.  Laboratory Data:  Chemistry Recent Labs  Lab 04/15/19 1042 04/16/19 0952  NA 135 132*  K 3.7 3.7  CL 101 98  CO2 25 24  GLUCOSE 104* 130*  BUN 7* 7*  CREATININE 0.95 0.98  CALCIUM 8.7* 8.9  GFRNONAA >60 >60  GFRAA >60 >60  ANIONGAP 9 10    Recent Labs  Lab 04/15/19 1042 04/16/19 0952  PROT 6.5 6.5  ALBUMIN 2.8* 2.9*  AST 32 31  ALT 20 19  ALKPHOS 74 76  BILITOT 0.6 0.7   Hematology Recent Labs  Lab 04/15/19 1042 04/16/19 0952  WBC 4.5 3.7*  RBC 4.78 4.74  HGB 13.4 13.2  HCT 40.4 40.3  MCV 84.5 85.0  MCH 28.0 27.8  MCHC 33.2 32.8  RDW 15.0 14.7  PLT 189 172   Cardiac Enzymes Recent Labs  Lab 04/15/19 1042  TROPONINI <0.03   No results for input(s): TROPIPOC in the last 168 hours.  BNP Recent Labs  Lab 04/15/19 1042  BNP 142.9*    DDimer  Recent Labs  Lab 04/15/19 1042  DDIMER 0.95*    Radiology/Studies:   Dg Chest Port 1 View Result Date: 04/15/2019 CLINICAL DATA:  Weakness.  Chills. Cough. EXAM: PORTABLE CHEST 1 VIEW COMPARISON:  04/20/2018 FINDINGS: Midline trachea. Cardiomegaly accentuated by AP portable technique. No pleural effusion or pneumothorax. No congestive failure. External pacer/defibrillator. Clear lungs. Surgical clips in the left axilla. IMPRESSION: Cardiomegaly without congestive failure. Electronically Signed   By: Abigail Miyamoto M.D.   On: 04/15/2019 11:16    Assessment and Plan:   1. Mobitz II heart block with predominantly 3:1 conduction, perhaps some suggestion of CHB at times     Symptomatic bradycardia      difficult to know  symptomatic she is from COVID vs advanced heart block/bradycardia suspect the bradycardia playing a bigger role      Escape morphology is an icRBBB, unchanged from her baseline     BP is stable     Last dose of coreg was 04/14/2019 @ about 2000     Coreg 1/2 life is 7-10  Hours     She is beyond the shorter end of her 1/2 life of a very small dose of drug and Arwen Haseley require PPM  She has had a limited echo done, pending read  Dr. Curt Bears has discussed the case with IM/attending MD.  The patient has no worrisome symptoms in relation to her COVID infection, and was admitted 2/2 advanced heart block only, otherwise in her opinion, would not require hospital care has called in and discussed recommendation for PPM and rational for it.  Discussed the procedure, it's potential benefits and risks  The patient is agreeable to proceed.   2. Hx of NICM (appears 2/2 chemo)     Last echo with recovered LVEF     Limited echo has been completed this AM, pending read     Mykenna Viele be good to be able to maintain her BB therapy and Okie Jansson be resumed post implant     CXR does not suggest fluid OL currently        For questions or updates, please contact [facility] Please consult [email or web address] for contact info under     Signed, Baldwin Jamaica, PA-C  04/16/2019 11:37 AM  I have seen and examined this patient with Tommye Standard.  Agree with above, note added to reflect my findings.  The visit was done virtually as the patient is COVID19 positive.  No exam was performed.  Patient presented to the hospital with weakness and fatigue.  Was found to be in 3-1 AV block.  She has been coronavirus positive for approximately the last 2 weeks, but over the last few days has gotten significantly more weak and fatigued.  She had previously been taking care of her family members who are also positive for the virus.  She is on carvedilol at home, but this has been stopped  and has washed out of her system.  She would thus  benefit from pacemaker insertion.  ALPHIA BEHANNA has presented today for surgery, with the diagnosis of second degree AV block.  The various methods of treatment have been discussed with the patient and family. After consideration of risks, benefits and other options for treatment, the patient has consented to  Procedure(s): Pacemaker insertion as a surgical intervention .  Risks include but not limited to bleeding, tamponade, infection, pneumothorax, among others. The patient's history has been reviewed, patient examined, no change in status, stable for surgery.  I have reviewed the patient's chart and labs.  Questions were answered to the patient's satisfaction.    Elienai Gailey Curt Bears, MD 04/16/2019 12:58 PM    [place] M. Jennah Satchell MD 04/16/2019 12:55 PM

## 2019-04-16 NOTE — Progress Notes (Signed)
PROGRESS NOTE    KRYSIA ZAHRADNIK  KCL:[id number] DOB: 1956-04-22 DOA: 04/15/2019 PCP: Hulan Fess, MD Brief Narrative:63 y.o. female with medical history significant of NICM on Coreg and Corlanor;  OSA; HTN; HLD; and breast cancer with recurrence presenting with cough.  She reports that she has had several months of intermittent weakness.  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  Her COVID symptoms are mostly improved, but her weakness has progressed to the point that she could not even get out of bed without assistance this AM.  She has a h/o NICM but has not had medication changes in a couple of years.   ED Course:  COVID patient.  Complicated by heart block.  Cardiology consulted, recommends staying here.  Possibly related to medications.  Dr. Aundra Dubin suggests staying here.  Dr. Tomi Bamberger has spoken to the family  6/8 -patient resting in bed no chest pain breathing better have been having generalized weakness for 2 months.lives at home with family.daughter had covid.  Assessment & Plan:   Principal Problem:   COVID-19 virus infection Active Problems:   DCM (dilated cardiomyopathy) ([place])   Benign essential HTN   History of breast cancer in female   Symptomatic advanced heart block  COVID-19 infection-patient was initially on 4 L of oxygen now on room air.  Patient with known diagnosis of COVID guarded from her rest of the family members.  Lives at home with her daughter who also had [place].  Respiratory symptoms are mild with minimal cough.  Chest x-ray no acute active disease.  Since she is not hypoxic negative chest x-ray findings will not start COVID treatment.-Pertinent labs concerning for COVID include leukopenia/lymphopenia; increased BUN/Creatinine; normal WBC count; low procalcitonin; elevated D-dimer (but not >1); elevated  CRP (but not >7) -Will not treat with broad-spectrum antibiotics given procalcitonin <0.1 -At this time, will attempt to avoid use of aerosolized medications and use HFAs instead -Patient was seen wearing full PPE including: gown, gloves, head cover, N95, and face shield; donning and doffing was in compliance with current standards.   Symptomatic advanced heart block- -Patient has advanced heart block on EKG and HR consistently 30-40s.  Hold beta-blockers.  COVID-19 itself can cause heart blocks.  Appreciate cardiology and EP input.  She is going for a pacemaker today.  Patient is still infectious since she is still positive for COVID-19 but need to make sure full PPE worn during the procedure.  Dilated CM -Prior echo with EF as low as 30-35% but normalized to 50-55% on last echo in 2/19 -She does have grade 2 diastolic CHF -Follow-up echo done today. 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   Disposition Plan:  Home once clinically improved Consults called: Cardiology and EP  Estimated body mass index is 41.02 kg/m as calculated from the following:   Height as of 03/30/19: 5\' 4"  (1.626 m).   Weight as of 03/30/19: 108.4 kg.    Subjective: Patient is resting in bed she reports feeling weak and having been having generalized weakness for 2  months her daughter was diagnosed with COVID end of May.   Objective: Vitals:   04/16/19 0000 04/16/19 0200 04/16/19 0222 04/16/19 0400  BP:  (!) 148/45  (!) 158/38  Pulse:  (!) 31 (!) 32 (!) 33  Resp: 20  (!) 21 (!) 22  Temp: 98.3 F (36.8 C)     TempSrc: Oral     SpO2: 99% 100% 99% 99%   No intake or output data in the 24 hours ending  04/16/19 0820 There were no vitals filed for this visit.  Examination:  General exam: Appears calm and comfortable  Respiratory system: Clear to auscultation. Respiratory effort normal. Cardiovascular system: S1 & S2 heard, . No JVD, murmurs, rubs, gallops or clicks. No pedal edema. Gastrointestinal system: Abdomen is nondistended, soft and nontender. No organomegaly or masses felt. Normal bowel sounds heard. Central nervous system: Alert and oriented. No focal neurological deficits. Extremities: Symmetric 5 x 5 power. Skin: No rashes, lesions or ulcers Psychiatry: Judgement and insight appear normal. Mood & affect appropriate.     Data Reviewed: I have personally reviewed following labs and imaging studies  CBC: Recent Labs  Lab 04/15/19 1042  WBC 4.5  NEUTROABS 2.9  HGB 13.4  HCT 40.4  MCV 84.5  PLT 712   Basic Metabolic Panel: Recent Labs  Lab 04/15/19 1042  NA 135  K 3.7  CL 101  CO2 25  GLUCOSE 104*  BUN 7*  CREATININE 0.95  CALCIUM 8.7*   GFR: CrCl cannot be calculated (Unknown ideal weight.). Liver Function Tests: Recent Labs  Lab 04/15/19 1042  AST 32  ALT 20  ALKPHOS 74  BILITOT 0.6  PROT 6.5  ALBUMIN 2.8*   No results for input(s): LIPASE, AMYLASE in the last 168 hours. No results for input(s): AMMONIA in the last 168 hours. Coagulation Profile: No results for input(s): INR, PROTIME in the last 168 hours. Cardiac Enzymes: Recent Labs  Lab 04/15/19 1042  TROPONINI <0.03   BNP (last 3 results) No results for input(s): PROBNP in the last 8760 hours. HbA1C: No results for input(s): HGBA1C in the last 72 hours. CBG: No results for input(s): GLUCAP in the last 168 hours. Lipid Profile: Recent Labs    04/15/19 1042  TRIG 110   Thyroid Function Tests: No results for input(s): TSH, T4TOTAL, FREET4, T3FREE, THYROIDAB in the last 72 hours. Anemia Panel: Recent Labs    04/15/19 1042  FERRITIN 58   Sepsis Labs: Recent Labs  Lab  04/15/19 1042 04/15/19 1100  PROCALCITON <0.10  --   LATICACIDVEN  --  0.9    Recent Results (from the past 240 hour(s))  SARS Coronavirus 2 [facility] order, Performed in [place] hospital lab)     Status: Abnormal   Collection Time: 04/15/19 10:42 AM  Result Value Ref Range Status   SARS Coronavirus 2 POSITIVE (A) NEGATIVE Final    Comment: CRITICAL RESULT CALLED TO, READ BACK BY AND VERIFIED WITH: RN MARLOW, C 1328 [id number] FCP (NOTE) If result is NEGATIVE SARS-CoV-2 target nucleic acids are NOT DETECTED. The SARS-CoV-2 RNA is generally detectable in upper and lower  respiratory specimens during the acute phase of infection. The lowest  concentration of SARS-CoV-2 viral copies this assay can detect is 250  copies / mL. A negative result does not preclude SARS-CoV-2 infection  and should not be used as the sole basis for treatment or other  patient management decisions.  A negative result may occur with  improper specimen collection /  handling, submission of specimen other  than nasopharyngeal swab, presence of viral mutation(s) within the  areas targeted by this assay, and inadequate number of viral copies  (<250 copies / mL). A negative result must be combined with clinical  observations, patient history, and epidemiological information. If result is POSITIVE SARS-CoV-2 target nucleic acids are DETECTED.  The SARS-CoV-2 RNA is generally detectable in upper and lower  respiratory specimens during the acute phase of infection.  Positive  results are indicative of active infection with SARS-CoV-2.  Clinical  correlation with patient history and other diagnostic information is  necessary to determine patient infection status.  Positive results do  not rule out bacterial infection or co-infection with other viruses. If result is PRESUMPTIVE POSTIVE SARS-CoV-2 nucleic acids MAY BE PRESENT.   A presumptive positive result was obtained on the submitted specimen  and confirmed on  repeat testing.  While 2019 novel coronavirus  (SARS-CoV-2) nucleic acids may be present in the submitted sample  additional confirmatory testing may be necessary for epidemiological  and / or clinical management purposes  to differentiate between  SARS-CoV-2 and other Sarbecovirus currently known to infect humans.  If clinically indicated additional testing with an alternate test  methodology [phone])  is advised. The SARS-CoV-2 RNA is generally  detectable in upper and lower respiratory specimens during the acute  phase of infection. The expected result is Negative. Fact Sheet for Patients:  StrictlyIdeas.no Fact Sheet for Healthcare Providers: BankingDealers.co.za This test is not yet approved or cleared by the [place] FDA and has been authorized for detection and/or diagnosis of SARS-CoV-2 by FDA under an Emergency Use Authorization (EUA).  This EUA will remain in effect (meaning this test can be used) for the duration of the COVID-19 declaration under Section 564(b)(1) of the Act, 21 U.S.C. section 360bbb-3(b)(1), unless the authorization is terminated or revoked sooner. Performed at [facility], [place] [address], [place], [place] [address]          Radiology Studies: Dg Chest Port 1 View  Result Date: 04/15/2019 CLINICAL DATA:  Weakness.  Chills. Cough. EXAM: PORTABLE CHEST 1 VIEW COMPARISON:  04/20/2018 FINDINGS: Midline trachea. Cardiomegaly accentuated by AP portable technique. No pleural effusion or pneumothorax. No congestive failure. External pacer/defibrillator. Clear lungs. Surgical clips in the left axilla. IMPRESSION: Cardiomegaly without congestive failure. Electronically Signed   By: Abigail Miyamoto M.D.   On: 04/15/2019 11:16        Scheduled Meds: . ALPRAZolam  0.5 mg Oral BID  . anastrozole  1 mg Oral Daily  . docusate sodium  100 mg Oral BID  . DULoxetine  30 mg Oral QHS  . enoxaparin (LOVENOX)  injection  40 mg Subcutaneous Q24H  . feeding supplement (ENSURE ENLIVE)  237 mL Oral BID BM  . fluticasone  1 spray Each Nare q morning - 10a  . gabapentin  300 mg Oral BID  . loratadine  10 mg Oral Daily  . pantoprazole  40 mg Oral Daily  . sacubitril-valsartan  1 tablet Oral BID  . sodium chloride flush  3 mL Intravenous Q12H   Continuous Infusions:   LOS: 1 day     Georgette Shell, MD [facility]  If 7PM-7AM, please contact night-coverage [email or web address] Password [email or web address] 04/16/2019, 8:20 AM

## 2019-04-17 ENCOUNTER — Encounter (HOSPITAL_COMMUNITY): Payer: Self-pay | Admitting: Cardiology

## 2019-04-17 ENCOUNTER — Inpatient Hospital Stay (HOSPITAL_COMMUNITY)

## 2019-04-17 LAB — CBC WITH DIFFERENTIAL/PLATELET
Abs Immature Granulocytes: 0.01 10*3/uL (ref 0.00–0.07)
Basophils Absolute: 0 10*3/uL (ref 0.0–0.1)
Basophils Relative: 1 %
Eosinophils Absolute: 0.1 10*3/uL (ref 0.0–0.5)
Eosinophils Relative: 3 %
HCT: 39.4 % (ref 36.0–46.0)
Hemoglobin: 13.1 g/dL (ref 12.0–15.0)
Immature Granulocytes: 0 %
Lymphocytes Relative: 44 %
Lymphs Abs: 1.6 10*3/uL (ref 0.7–4.0)
MCH: 28.2 pg (ref 26.0–34.0)
MCHC: 33.2 g/dL (ref 30.0–36.0)
MCV: 84.7 fL (ref 80.0–100.0)
Monocytes Absolute: 0.3 10*3/uL (ref 0.1–1.0)
Monocytes Relative: 9 %
Neutro Abs: 1.5 10*3/uL — ABNORMAL LOW (ref 1.7–7.7)
Neutrophils Relative %: 43 %
Platelets: 167 10*3/uL (ref 150–400)
RBC: 4.65 MIL/uL (ref 3.87–5.11)
RDW: 15.2 % (ref 11.5–15.5)
WBC: 3.5 10*3/uL — ABNORMAL LOW (ref 4.0–10.5)
nRBC: 0 % (ref 0.0–0.2)

## 2019-04-17 LAB — HIV ANTIBODY (ROUTINE TESTING W REFLEX): HIV Screen 4th Generation wRfx: NONREACTIVE

## 2019-04-17 LAB — COMPREHENSIVE METABOLIC PANEL
ALT: 19 U/L (ref 0–44)
AST: 32 U/L (ref 15–41)
Albumin: 2.8 g/dL — ABNORMAL LOW (ref 3.5–5.0)
Alkaline Phosphatase: 67 U/L (ref 38–126)
Anion gap: 11 (ref 5–15)
BUN: 6 mg/dL — ABNORMAL LOW (ref 8–23)
CO2: 23 mmol/L (ref 22–32)
Calcium: 8.9 mg/dL (ref 8.9–10.3)
Chloride: 100 mmol/L (ref 98–111)
Creatinine, Ser: 0.9 mg/dL (ref 0.44–1.00)
GFR calc Af Amer: >60 mL/min (ref >60)
GFR calc non Af Amer: >60 mL/min (ref >60)
Glucose, Bld: 93 mg/dL (ref 70–99)
Potassium: 3.5 mmol/L (ref 3.5–5.1)
Sodium: 134 mmol/L — ABNORMAL LOW (ref 135–145)
Total Bilirubin: 0.9 mg/dL (ref 0.3–1.2)
Total Protein: 6.7 g/dL (ref 6.5–8.1)

## 2019-04-17 NOTE — Discharge Instructions (Signed)
ONCE HOME, PLEASE SEND A REMOTE PACEMAKER TRANSMISSION AS DISCUSSED  and  PLEASE SIGN UP TO YOUR MY CHART ACCOUNT WHEN YOU GET HOME (if you aren't already).  IN THE CURRENT ENVIRONMENT WITH COVID-19, IN EFFORT TO REDUCE YOUR EXPOSURE WE WILL BE CONDUCTING MANY PATIENT VISITS BY EITHER VIRTUAL/VIDEO VISITS or TELEPHONE VISITS.  BEING SIGNED UP IN YOUR MY CHART ACCOUNT  WILL HELP FACILITATE THESE VISITS AND OUR COMMUNICATION WITH YOU        Supplemental Discharge Instructions for  Pacemaker/Defibrillator Patients  Activity No heavy lifting or vigorous activity with your left/right arm for 6 to 8 weeks.  Do not raise your left/right arm above your head for one week.  Gradually raise your affected arm as drawn below.              04/20/2019                 04/21/2019               04/22/2019               04/23/2019 __  NO DRIVING for  1 wek   ; you may begin driving on  4/40/1027, or once cleared to after completing recommended quarentine  .  WOUND CARE - Keep the wound area clean and dry.  Do not get this area wet, no showers untiil cleared to at your wound check visit . - The tape/steri-strips on your wound will fall off; do not pull them off.  No bandage is needed on the site.  DO  NOT apply any creams, oils, or ointments to the wound area. - If you notice any drainage or discharge from the wound, any swelling or bruising at the site, or you develop a fever > 101? F after you are discharged home, call the office at once.  Special Instructions - You are still able to use cellular telephones; use the ear opposite the side where you have your pacemaker/defibrillator.  Avoid carrying your cellular phone near your device. - When traveling through airports, show security personnel your identification card to avoid being screened in the metal detectors.  Ask the security personnel to use the hand wand. - Avoid arc welding equipment, MRI testing (magnetic resonance imaging), TENS units  (transcutaneous nerve stimulators).  Call the office for questions about other devices. - Avoid electrical appliances that are in poor condition or are not properly grounded. - Microwave ovens are safe to be near or to operate.      YOUR CARDIOLOGY TEAM HAS ARRANGED FOR AN E-VISIT FOR YOUR APPOINTMENT - PLEASE REVIEW IMPORTANT INFORMATION BELOW SEVERAL DAYS PRIOR TO YOUR APPOINTMENT  Due to the recent COVID-19 pandemic, we are transitioning in-person office visits to tele-medicine visits in an effort to decrease unnecessary exposure to our patients, their families, and staff. These visits are billed to your insurance just like a normal visit is. We also encourage you to sign up for MyChart if you have not already done so. You will need a smartphone if possible. For patients that do not have this, we can still complete the visit using a regular telephone but do prefer a smartphone to enable video when possible. You may have a family member that lives with you that can help. If possible, we also ask that you have a blood pressure cuff and scale at home to measure your blood pressure, heart rate and weight prior to your scheduled appointment. Patients with clinical needs that need  an in-person evaluation and testing will still be able to come to the office if absolutely necessary. If you have any questions, feel free to call our office.     YOUR PROVIDER WILL BE USING THE FOLLOWING PLATFORM TO COMPLETE YOUR VISIT: [place] and/or Doximity  IF USING MYCHART - How to Download the MyChart App to Your SmartPhone   - If Apple, go to [employer] and type in MyChart in the search bar and download the app. If Android, ask patient to go to [employer] and type in [place] in the search bar and download the app. The app is free but as with any other app downloads, your phone may require you to verify saved payment information or Apple/Android password.  - You will need to then log into the app with your  MyChart username and password, and select [facility] as your healthcare provider to link the account.  - When it is time for your visit, go to the MyChart app, find appointments, and click Begin Video Visit. Be sure to Select Allow for your device to access the Microphone and Camera for your visit. You will then be connected, and your provider will be with you shortly.  **If you have any issues connecting or need assistance, please contact MyChart service desk [phone])**  **If using a computer, in order to ensure the best quality for your visit, you will need to use either of the following Internet Browsers: Insurance underwriter or Microsoft Edge**   IF USING DOXIMITY or [email or web address] - The staff will give you instructions on receiving your link to join the meeting the day of your visit.      2-3 DAYS BEFORE YOUR APPOINTMENT  You will receive a telephone call from one of our [place] team members - your caller ID may say "Unknown caller." If this is a video visit, we will walk you through how to get the video launched on your phone. We will remind you check your blood pressure, heart rate and weight prior to your scheduled appointment. If you have an Apple Watch or Kardia, please upload any pertinent ECG strips the day before or morning of your appointment to [place]. Our staff will also make sure you have reviewed the consent and agree to move forward with your scheduled tele-health visit.     THE DAY OF YOUR APPOINTMENT  Approximately 15 minutes prior to your scheduled appointment, you will receive a telephone call from one of [facility] team - your caller ID may say "Unknown caller."  Our staff will confirm medications, vital signs for the day and any symptoms you may be experiencing. Please have this information available prior to the time of visit start. It may also be helpful for you to have a pad of paper and pen handy for any instructions given during your visit. They will also  walk you through joining the smartphone meeting if this is a video visit.    CONSENT FOR TELE-HEALTH VISIT - PLEASE REVIEW  I hereby voluntarily request, consent and authorize [facility] and its employed or contracted physicians, physician assistants, nurse practitioners or other licensed health care professionals (the Practitioner), to provide me with telemedicine health care services (the Services") as deemed necessary by the treating Practitioner. I acknowledge and consent to receive the Services by the Practitioner via telemedicine. I understand that the telemedicine visit will involve communicating with the Practitioner through live audiovisual communication technology and the disclosure of certain medical information by electronic transmission.  I acknowledge that I have been given the opportunity to request an in-person assessment or other available alternative prior to the telemedicine visit and am voluntarily participating in the telemedicine visit.  I understand that I have the right to withhold or withdraw my consent to the use of telemedicine in the course of my care at any time, without affecting my right to future care or treatment, and that the Practitioner or I may terminate the telemedicine visit at any time. I understand that I have the right to inspect all information obtained and/or recorded in the course of the telemedicine visit and may receive copies of available information for a reasonable fee.  I understand that some of the potential risks of receiving the Services via telemedicine include:   Delay or interruption in medical evaluation due to technological equipment failure or disruption;  Information transmitted may not be sufficient (e.g. poor resolution of images) to allow for appropriate medical decision making by the Practitioner; and/or   In rare instances, security protocols could fail, causing a breach of personal health information.  Furthermore, I acknowledge  that it is my responsibility to provide information about my medical history, conditions and care that is complete and accurate to the best of my ability. I acknowledge that Practitioner's advice, recommendations, and/or decision may be based on factors not within their control, such as incomplete or inaccurate data provided by me or distortions of diagnostic images or specimens that may result from electronic transmissions. I understand that the practice of medicine is not an exact science and that Practitioner makes no warranties or guarantees regarding treatment outcomes. I acknowledge that I will receive a copy of this consent concurrently upon execution via email to the email address I last provided but may also request a printed copy by calling the office of [facility].    I understand that my insurance will be billed for this visit.   I have read or had this consent read to me.  I understand the contents of this consent, which adequately explains the benefits and risks of the Services being provided via telemedicine.   I have been provided ample opportunity to ask questions regarding this consent and the Services and have had my questions answered to my satisfaction.  I give my informed consent for the services to be provided through the use of telemedicine in my medical care  By participating in this telemedicine visit I agree to the above.

## 2019-04-17 NOTE — Progress Notes (Addendum)
Received report from [place] at [facility]. Patient ready for discharge   Trynity Skousen, Tivis Ringer, RN

## 2019-04-17 NOTE — Progress Notes (Signed)
Discharge instructions, RX's and follow up appts explained and provided to patient verbalized understanding. ° °Ervey Fallin Lynn, RN ° °

## 2019-04-17 NOTE — Progress Notes (Addendum)
Progress Note  Patient Name: Meagan Ryan Date of Encounter: 04/17/2019  Primary Cardiologist: Dr. Radford Pax  Subjective   The patient was called into the room on her room telephone by Dr. Curt Bears, visit was managed virtually.  The patient's nurse had already been in and has seen the patient this morning.  Reports PPM site stable, dry dressing.  The patient states "I feel so much better, more energy"  "Still a little wheeze once in a while, but much better then yesterday"  She denies any CP, looking forward to getting home  Inpatient Medications    Scheduled Meds: . ALPRAZolam  0.5 mg Oral BID  . anastrozole  1 mg Oral Daily  . docusate sodium  100 mg Oral BID  . DULoxetine  30 mg Oral QHS  . enoxaparin (LOVENOX) injection  40 mg Subcutaneous Q24H  . feeding supplement (ENSURE ENLIVE)  237 mL Oral BID BM  . fluticasone  1 spray Each Nare q morning - 10a  . gabapentin  300 mg Oral BID  . loratadine  10 mg Oral Daily  . pantoprazole  40 mg Oral Daily  . sacubitril-valsartan  1 tablet Oral BID  . sodium chloride flush  3 mL Intravenous Q12H  . sodium chloride flush  3 mL Intravenous Q12H   Continuous Infusions:  PRN Meds: acetaminophen, bisacodyl, nortriptyline, ondansetron **OR** ondansetron (ZOFRAN) IV, oxyCODONE, polyethylene glycol   Vital Signs    Vitals:   04/16/19 2000 04/17/19 0007 04/17/19 0200 04/17/19 0500  BP: (!) 175/93 (!) 146/90  (!) 163/83  Pulse: (!) 106 (!) 102 96 97  Resp: (!) 21 (!) 24 (!) 25 (!) 27  Temp: 98.4 F (36.9 C) 98.4 F (36.9 C)  98.4 F (36.9 C)  TempSrc: Oral Oral  Oral  SpO2: 98% 95% 93% 95%  Weight:      Height:        Intake/Output Summary (Last 24 hours) at 04/17/2019 0737 Last data filed at 04/17/2019 0659 Gross per 24 hour  Intake 785.06 ml  Output -  Net 785.06 ml   Last 3 Weights 04/16/2019 03/30/2019 10/25/2018  Weight (lbs) 239 lb 10.2 oz 239 lb 238 lb 1.6 oz  Weight (kg) 108.7 kg 108.41 kg 108.001 kg      Telemetry     SR, V paced - Personally Reviewed  ECG    SR, V paced - Personally Reviewed  Physical Exam   Telephone/virtual visit  GEN: strong animated voice.  asks good and appropriate questions Cardiac: telemetry reviewed, echo from yesterday reviewed.  Respiratory: the patient does not sound SOB, speaking in full sentences Psych: very pleasent   Labs    Chemistry Recent Labs  Lab 04/15/19 1042 04/16/19 0952 04/17/19 0418  NA 135 132* 134*  K 3.7 3.7 3.5  CL 101 98 100  CO2 25 24 23   GLUCOSE 104* 130* 93  BUN 7* 7* 6*  CREATININE 0.95 0.98 0.90  CALCIUM 8.7* 8.9 8.9  PROT 6.5 6.5 6.7  ALBUMIN 2.8* 2.9* 2.8*  AST 32 31 32  ALT 20 19 19   ALKPHOS 74 76 67  BILITOT 0.6 0.7 0.9  GFRNONAA >60 >60 >60  GFRAA >60 >60 >60  ANIONGAP 9 10 11      Hematology Recent Labs  Lab 04/15/19 1042 04/16/19 0952 04/17/19 0418  WBC 4.5 3.7* 3.5*  RBC 4.78 4.74 4.65  HGB 13.4 13.2 13.1  HCT 40.4 40.3 39.4  MCV 84.5 85.0 84.7  MCH 28.0 27.8  28.2  MCHC 33.2 32.8 33.2  RDW 15.0 14.7 15.2  PLT 189 172 167    Cardiac Enzymes Recent Labs  Lab 04/15/19 1042  TROPONINI <0.03   No results for input(s): TROPIPOC in the last 168 hours.   BNP Recent Labs  Lab 04/15/19 1042  BNP 142.9*     DDimer  Recent Labs  Lab 04/15/19 1042  DDIMER 0.95*     Radiology      Cardiac Studies   04/16/2019: TTE IMPRESSIONS 1. The left ventricle has normal systolic function, with an ejection fraction of 55-60%. The cavity size was normal. Left ventricular diastolic Doppler parameters are consistent with restrictive filling. Images poor, inadequate for regional wall motion  evaluation.  2. The right ventricle has normal systolic function. The cavity was mildly enlarged.  3. There is mild mitral annular calcification present. No evidence of mitral valve stenosis. No mitral regurgitation.  4. The aortic valve is tricuspid No stenosis of the aortic valve.  5. The aortic root is normal in size  and structure.  6. Left atrial size was not well visualized.  7. The interatrial septum was not well visualized.  8. The IVC was not visualized. No complete TR doppler jet so unable to estimate PA systolic pressure.  9. Technically difficult study.  FINDINGS  Left Ventricle: The left ventricle has normal systolic function, with an ejection fraction of 55-60%. The cavity size was normal. There is no increase in left ventricular wall thickness. Left ventricular diastolic Doppler parameters are consistent with  restrictive filling (grade III).    Right Ventricle: The right ventricle has normal systolic function. The cavity was mildly enlarged.  Left Atrium: Left atrial size was not well visualized.  Right Atrium: Right atrial size was not well visualized.  Interatrial Septum: The interatrial septum was not well visualized.  Mitral Valve: The mitral valve is normal in structure. There is mild mitral annular calcification present. Mitral valve regurgitation is not visualized by color flow Doppler. No evidence of mitral valve stenosis.  Tricuspid Valve: The tricuspid valve was normal in structure. Tricuspid valve regurgitation is trivial by color flow Doppler.  Aortic Valve: The aortic valve is tricuspid Aortic valve regurgitation was not visualized by color flow Doppler. There is No stenosis of the aortic valve.  Pulmonic Valve: The pulmonic valve was normal in structure. Pulmonic valve regurgitation is trivial by color flow Doppler. No evidence of pulmonic stenosis.  Aorta: The aortic root is normal in size and structure.   Patient Profile     63 y.o. female with a hx of DCM in the setting of chemo (for breast cancer) that had recovered, HTN, HLD,m ild OSA intolerant of CPAP, h/o stroke by MRI, admitted with worsening fatigue and acutely worse the day of admission, no syncope.  The patient was known to be COVID+ prior to her arrival, was convalescing/recuperating  at home  well, until acutely onset of worsened weakness, fatigue, on arrival noted in advanced heart block, primarily 3:1 conduction with transient CHB as well.  In d/w IM admitted only 2/2 her heart block, otherwise would not have required any in-patient management of her COVID infection with stable respiratory status  Her home coreg (and corlanor though not likely to have been contributing) was held and washed out, she remained in 3:1 AV block and had PPM implanted yesterday  Assessment & Plan     1. Mobitz II heart block with predominantly 3:1 conduction, perhaps some suggestion of CHB at times  Symptomatic bradycardia      She is now s/p PPM implant yesterday with Dr. Curt Bears. In d/w the patient over the phone this morning, she is feeling markedly better and her bradycardia likely playing a big role in her fatigue the last few days if not longer.  Given her COVID19 + status, her visit today was done virtually by Dr. Curt Bears (as if she had been a same day discharge) The RN had already been in and described her site as stable with dry dressing,  Given instructions to remove her tegaderm prior to discharge and leave steri strips in place, and remove her arm sling this AM as well.  Dr. Curt Bears discussed wound care instructions and left arm activity restrictions with the patient and these are as well provided in her AVS. MDT rep yesterday post procedure reviewed home transmitter with the patient, and this was re-reviewed this morning over the phone with her as well.  She is instructed once home to send a remote transmission. The patient was urged to call the office with any concerns or questions  pCXR from this Am was reviewed by Dr. Curt Bears with stable lead position. Official CXR read is pending Once CXR is read and if no pneumothorax is OK to discharge from EP perspective   2. Hx of NICM (appears 2/2 chemo)     EF remains recovered     OK to resume home medicines including coreg on  discharge   3. COVID +     Known for her prior to coming in     No overt SOB, pt reports some general malaise and occasional "slight wheeze"  but felt to be recovering in general     Has no O2 requirements     Deferred to IM   For questions or updates, please contact [place] Please consult [email or web address] for contact info under        Signed, Baldwin Jamaica, PA-C  04/17/2019, 7:37 AM    I have seen and examined this patient with Tommye Standard.  Agree with above, note added to reflect my findings.  Exam was done as this visit was done remotely.  She is now status post [employer] dual-chamber pacemaker for 3-1 AV block.  Device functioning appropriately.  Chest x-ray and interrogation without issue.  OK for discharge from an EP perspective.  Follow-up Bhakti Labella be made in device clinic. Bethany Cumming M. Arionne Iams MD 04/17/2019 8:23 AM

## 2019-04-17 NOTE — Progress Notes (Addendum)
I have called the patient and discussed her wound check visit is currently scheduled as a video visit from home.  She has a smart phone and reports that her daughter will be able to assist as well.  She is comfortable with the video call/visit. Information is provided for her in her AVS for this as well. We revisited sending a transmission once home as well.    The patient voiced understanding and mentions to me how much better she feels today.  She was encouraged to call for any questions or concerns  CXR from this AM is without pneumothorax.  Tommye Standard, PA-C

## 2019-04-17 NOTE — Plan of Care (Signed)
  Problem: Education: Goal: Knowledge of General Education information will improve Description Including pain rating scale, medication(s)/side effects and non-pharmacologic comfort measures Outcome: Completed/Met   Problem: Health Behavior/Discharge Planning: Goal: Ability to manage health-related needs will improve Outcome: Completed/Met   Problem: Clinical Measurements: Goal: Ability to maintain clinical measurements within normal limits will improve Outcome: Completed/Met Goal: Will remain free from infection Outcome: Completed/Met Goal: Diagnostic test results will improve Outcome: Completed/Met Goal: Respiratory complications will improve Outcome: Completed/Met Goal: Cardiovascular complication will be avoided Outcome: Completed/Met   Problem: Activity: Goal: Risk for activity intolerance will decrease Outcome: Completed/Met   Problem: Nutrition: Goal: Adequate nutrition will be maintained Outcome: Completed/Met   Problem: Coping: Goal: Level of anxiety will decrease Outcome: Completed/Met   Problem: Elimination: Goal: Will not experience complications related to bowel motility Outcome: Completed/Met Goal: Will not experience complications related to urinary retention Outcome: Completed/Met   Problem: Pain Managment: Goal: General experience of comfort will improve Outcome: Completed/Met   Problem: Safety: Goal: Ability to remain free from injury will improve Outcome: Completed/Met   Problem: Skin Integrity: Goal: Risk for impaired skin integrity will decrease Outcome: Completed/Met   Problem: Education: Goal: Knowledge of cardiac device and self-care will improve Outcome: Completed/Met Goal: Ability to safely manage health related needs after discharge will improve Outcome: Completed/Met Goal: Individualized Educational Video(s) Outcome: Completed/Met   Problem: Cardiac: Goal: Ability to achieve and maintain adequate cardiopulmonary perfusion will  improve Outcome: Completed/Met

## 2019-04-17 NOTE — Discharge Summary (Signed)
Physician Discharge Summary  Meagan Ryan NAT:[id number] DOB: 1956-10-19 DOA: 04/15/2019  PCP: Hulan Fess, MD  Admit date: 04/15/2019 Discharge date: 04/17/2019  Admitted From: Home Disposition: Home  Recommendations for Outpatient Follow-up:  1. Follow up with PCP in 1-2 weeks 2. Please obtain BMP/CBC in one week 3. Follow-up with Dr. Curt Bears 4. Follow-up with cardiology Home Health none Equipment/Devices none Discharge Condition: Stable and improved CODE STATUS: Full code Diet recommendation: Cardiac  Brief/Interim Summary:62 y.o.femalewith medical history significant ofNICM on Coreg and Corlanor; OSA; HTN; HLD; and breast cancer with recurrencepresenting with cough. She reports that she has had several months of intermittent weakness. 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 Her COVID symptoms are mostly improved, but her weakness has progressed to the point that she could not even get out of bed without assistance this AM. She has a h/o NICM but has not had medication changes in a couple of years.   ED Course:COVID patient. Complicated by heart block. Cardiology consulted, recommends staying here. Possibly related to medications. Dr. Aundra Dubin suggests staying here. Dr. Tomi Bamberger has spoken to the family Discharge Diagnoses:  Principal Problem:   COVID-19 virus infection Active Problems:   DCM (dilated cardiomyopathy) ([place])   Benign essential HTN   History of breast cancer in female   Symptomatic advanced heart block   COVID-19 infection-patient was initially on 4 L of oxygen now on room air.  On the day of discharge she was on room air saturating above 92%.  Respiratory symptoms are mild with minimal cough.  Chest x-ray no acute active disease.  Since she is not hypoxic negative chest x-ray findings will  not started on  COVID treatment.-Pertinent labs concerning for COVID include leukopenia/lymphopenia; increased BUN/Creatinine;normal WBC count; low procalcitonin; elevated D-dimer (but not >1); elevated CRP (but not>7) -Patient was seen wearing full PPE including: gown, gloves, head cover, N95, and face shield; donning and doffing was in compliance with current standards.   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 Curt Bears 04/16/2019.  Patient reports feeling a lot better after the pacemaker was placed.  She feels her energy is coming back.  Follow-up with Dr. Curt Bears and general cardiology.  We will continue Entresto Coreg and Corlinor.  Follow-up chest x-ray this morning no evidence of pneumothorax.  Dilated CM in the setting of chemotherapy for breast cancer-echocardiogram done this admission shows ejection fraction 55 to 60% with normal cavity size.  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   Weight as of this encounter: 108.7 kg.  Discharge Instructions   Allergies as of 04/17/2019      Reactions   Amlodipine Shortness Of Breath   Noroxin [norfloxacin] Shortness Of Breath   Cephalexin Other (See Comments), Nausea And Vomiting   Fever got to 105 High temperature   Chocolate    Migraines    Codeine Nausea And Vomiting   Sulfa Antibiotics Nausea And Vomiting   Latex Itching, Rash   redness   Penicillins Rash   Has  patient had a PCN reaction causing immediate rash, facial/tongue/throat swelling, SOB or lightheadedness with hypotension:No Has patient had a PCN reaction causing severe rash involving mucus membranes or skin necrosis: Yes Has  patient had a PCN reaction that required hospitalization No Has patient had a PCN reaction occurring within the last 10 years: No If all of the above answers are "NO", then may proceed with Cephalosporin use.      Medication List    STOP taking these medications   ibuprofen 200 MG tablet Commonly known as:  ADVIL     TAKE these medications   Airborne Gummies Chew Chew 2 tablets by mouth daily.   ALPRAZolam 0.5 MG tablet Commonly known as:  XANAX Take 0.5 mg by mouth 2 (two) times daily.   anastrozole 1 MG tablet Commonly known as:  ARIMIDEX Take 1 tablet (1 mg total) by mouth daily.   azelastine 0.1 % nasal spray Commonly known as:  ASTELIN Place 1 spray into both nostrils 2 (two) times daily as needed for rhinitis or allergies.   carvedilol 3.125 MG tablet Commonly known as:  COREG Take 1 tablet (3.125 mg total) by mouth 2 (two) times daily with a meal. Please keep upcoming appt with Dr. Radford Pax. Thank you   cetirizine 10 MG tablet Commonly known as:  ZYRTEC Take 10 mg by mouth every morning.   cholecalciferol 25 MCG (1000 UT) tablet Commonly known as:  VITAMIN D3 Take 2,000 Units by mouth daily.   DULoxetine 30 MG capsule Commonly known as:  CYMBALTA Take 30 mg by mouth at bedtime.   Fish Oil 1000 MG Caps Take 2,000 mg by mouth 2 (two) times daily.   fluticasone 50 MCG/ACT nasal spray Commonly known as:  FLONASE Place 1 spray into both nostrils every morning.   gabapentin 300 MG capsule Commonly known as:  NEURONTIN Take 300 mg by mouth 2 (two) times daily.   ivabradine 5 MG Tabs tablet Commonly known as:  Corlanor Take 1 tablet (5 mg total) by mouth 2 (two) times daily with a meal.   niacin 500 MG tablet Take 500 mg by mouth daily.   nortriptyline 25 MG capsule Commonly known as:  PAMELOR Take 25 mg by mouth at bedtime as needed for sleep.   omeprazole 20 MG capsule Commonly known as:  PRILOSEC Take 20 mg by mouth daily.   potassium chloride 10  MEQ tablet Commonly known as:  K-DUR Take 10 mEq by mouth daily.   sacubitril-valsartan 24-26 MG Commonly known as:  Entresto Take 1 tablet by mouth 2 (two) times daily.      Follow-up Information    [place] [facility] Follow up.   Specialty:  Cardiology Why:  04/26/2019 @ 12:00PM (noon), this is currently scheduled as a video/virtual visit as we discussed.  Please see discharge instructions. Contact information: [address] [place]       Constance Haw, MD Follow up.   Specialty:  Cardiology Why:  07/17/2019 @ 2:00PM Contact information: [address] [place] [address] [phone]        Hulan Fess, MD Follow up.   Specialty:  Family Medicine Contact information: [place] [address] [phone]          Allergies  Allergen Reactions  . Amlodipine Shortness Of Breath  . Noroxin [Norfloxacin] Shortness Of Breath  . Cephalexin Other (See Comments) and Nausea And Vomiting  Fever got to 105 High temperature  . Chocolate     Migraines   . Codeine Nausea And Vomiting  . Sulfa Antibiotics Nausea And Vomiting  . Latex Itching and Rash    redness  . Penicillins Rash    Has patient had a PCN reaction causing immediate rash, facial/tongue/throat swelling, SOB or lightheadedness with hypotension:No Has patient had a PCN reaction causing severe rash involving mucus membranes or skin necrosis: Yes Has patient had a PCN reaction that required hospitalization No Has patient had a PCN reaction occurring within the last 10 years: No If all of the above answers are "NO", then may proceed with Cephalosporin use.     Consultations:  General cardiology and EP   Procedures/Studies: Dg Chest Port 1 View  Result Date: 04/17/2019 CLINICAL DATA:  63 year old female status post placement of a cardiac rhythm maintenance device EXAM: PORTABLE CHEST 1 VIEW  COMPARISON:  Prior chest x-ray 04/15/2019 FINDINGS: New left subclavian approach cardiac rhythm maintenance device. Leads project over the right atrium and right ventricle. The cardiac and mediastinal contours are within normal limits. No evidence of pneumothorax or large pleural effusion. Inspiratory volumes remain low. Scattered surgical clips in the upper abdomen, left breast and axilla. No acute osseous abnormality. IMPRESSION: Interval placement of a left subclavian approach cardiac rhythm maintenance device. No evidence of complication. Inspiratory volumes remain low. Electronically Signed   By: Jacqulynn Cadet M.D.   On: 04/17/2019 08:42   Dg Chest Port 1 View  Result Date: 04/15/2019 CLINICAL DATA:  Weakness.  Chills. Cough. EXAM: PORTABLE CHEST 1 VIEW COMPARISON:  04/20/2018 FINDINGS: Midline trachea. Cardiomegaly accentuated by AP portable technique. No pleural effusion or pneumothorax. No congestive failure. External pacer/defibrillator. Clear lungs. Surgical clips in the left axilla. IMPRESSION: Cardiomegaly without congestive failure. Electronically Signed   By: Abigail Miyamoto M.D.   On: 04/15/2019 11:16   (Echo, Carotid, EGD, Colonoscopy, ERCP)    Subjective: Patient sitting up by the side of the bed awake alert anxious to go home feeling better and stronger on room air saturating above 92% denies any new complaints has a sling on the left arm  Discharge Exam: Vitals:   04/17/19 0200 04/17/19 0500  BP:  (!) 163/83  Pulse: 96 97  Resp: (!) 25 (!) 27  Temp:  98.4 F (36.9 C)  SpO2: 93% 95%   Vitals:   04/16/19 2000 04/17/19 0007 04/17/19 0200 04/17/19 0500  BP: (!) 175/93 (!) 146/90  (!) 163/83  Pulse: (!) 106 (!) 102 96 97  Resp: (!) 21 (!) 24 (!) 25 (!) 27  Temp: 98.4 F (36.9 C) 98.4 F (36.9 C)  98.4 F (36.9 C)  TempSrc: Oral Oral  Oral  SpO2: 98% 95% 93% 95%  Weight:      Height:        General: Pt is alert, awake, not in acute distress left upper extremity sling  noted.  Pacemaker site clean dry intact. Cardiovascular: RRR, S1/S2 +, no rubs, no gallops Respiratory: CTA bilaterally, no wheezing, no rhonchi Abdominal: Soft, NT, ND, bowel sounds + Extremities: no edema, no cyanosis    The results of significant diagnostics from this hospitalization (including imaging, microbiology, ancillary and laboratory) are listed below for reference.     Microbiology: Recent Results (from the past 240 hour(s))  SARS Coronavirus 2 [facility] order, Performed in [place] hospital lab)     Status: Abnormal   Collection Time: 04/15/19 10:42 AM  Result Value Ref Range Status  SARS Coronavirus 2 POSITIVE (A) NEGATIVE Final    Comment: CRITICAL RESULT CALLED TO, READ BACK BY AND VERIFIED WITH: RN MARLOW, C 1328 [id number] FCP (NOTE) If result is NEGATIVE SARS-CoV-2 target nucleic acids are NOT DETECTED. The SARS-CoV-2 RNA is generally detectable in upper and lower  respiratory specimens during the acute phase of infection. The lowest  concentration of SARS-CoV-2 viral copies this assay can detect is 250  copies / mL. A negative result does not preclude SARS-CoV-2 infection  and should not be used as the sole basis for treatment or other  patient management decisions.  A negative result may occur with  improper specimen collection / handling, submission of specimen other  than nasopharyngeal swab, presence of viral mutation(s) within the  areas targeted by this assay, and inadequate number of viral copies  (<250 copies / mL). A negative result must be combined with clinical  observations, patient history, and epidemiological information. If result is POSITIVE SARS-CoV-2 target nucleic acids are DETECTED.  The SARS-CoV-2 RNA is generally detectable in upper and lower  respiratory specimens during the acute phase of infection.  Positive  results are indicative of active infection with SARS-CoV-2.  Clinical  correlation with patient history and other diagnostic  information is  necessary to determine patient infection status.  Positive results do  not rule out bacterial infection or co-infection with other viruses. If result is PRESUMPTIVE POSTIVE SARS-CoV-2 nucleic acids MAY BE PRESENT.   A presumptive positive result was obtained on the submitted specimen  and confirmed on repeat testing.  While 2019 novel coronavirus  (SARS-CoV-2) nucleic acids may be present in the submitted sample  additional confirmatory testing may be necessary for epidemiological  and / or clinical management purposes  to differentiate between  SARS-CoV-2 and other Sarbecovirus currently known to infect humans.  If clinically indicated additional testing with an alternate test  methodology [phone])  is advised. The SARS-CoV-2 RNA is generally  detectable in upper and lower respiratory specimens during the acute  phase of infection. The expected result is Negative. Fact Sheet for Patients:  StrictlyIdeas.no Fact Sheet for Healthcare Providers: BankingDealers.co.za This test is not yet approved or cleared by the [place] FDA and has been authorized for detection and/or diagnosis of SARS-CoV-2 by FDA under an Emergency Use Authorization (EUA).  This EUA will remain in effect (meaning this test can be used) for the duration of the COVID-19 declaration under Section 564(b)(1) of the Act, 21 U.S.C. section 360bbb-3(b)(1), unless the authorization is terminated or revoked sooner. Performed at [facility], [place] [address] [place], [place] [address]   Blood Culture (routine x 2)     Status: None (Preliminary result)   Collection Time: 04/15/19 11:00 AM  Result Value Ref Range Status   Specimen Description BLOOD LEFT WRIST  Final   Special Requests   Final    BOTTLES DRAWN AEROBIC AND ANAEROBIC Blood Culture results may not be optimal due to an excessive volume of blood received in culture bottles   Culture   Final     NO GROWTH < 24 HOURS Performed at [facility], [place] [address], [place], [place] [address]    Report Status PENDING  Incomplete  Blood Culture (routine x 2)     Status: None (Preliminary result)   Collection Time: 04/15/19 11:11 AM  Result Value Ref Range Status   Specimen Description BLOOD RIGHT HAND  Final   Special Requests AEROBIC BOTTLE ONLY Blood Culture adequate volume  Final   Culture   Final    NO GROWTH < 24 HOURS Performed at [facility], [place] [address], [place], [place] [address]    Report Status PENDING  Incomplete  Surgical PCR screen     Status: None   Collection Time: 04/16/19  2:25 PM  Result Value Ref Range Status   MRSA, PCR NEGATIVE NEGATIVE Final   Staphylococcus aureus NEGATIVE NEGATIVE Final    Comment: (NOTE) The Xpert SA Assay (FDA approved for NASAL specimens in patients 48 years of age and older), is one component of a comprehensive surveillance program. It is not intended to diagnose infection nor to guide or monitor treatment. Performed at [facility], [place] [address], [place], [place] [address]      Labs: BNP (last 3 results) Recent Labs    04/15/19 1042  BNP 332.9*   Basic Metabolic Panel: Recent Labs  Lab 04/15/19 1042 04/16/19 0952 04/17/19 0418  NA 135 132* 134*  K 3.7 3.7 3.5  CL 101 98 100  CO2 25 24 23   GLUCOSE 104* 130* 93  BUN 7* 7* 6*  CREATININE 0.95 0.98 0.90  CALCIUM 8.7* 8.9 8.9   Liver Function Tests: Recent Labs  Lab 04/15/19 1042 04/16/19 0952 04/17/19 0418  AST 32 31 32  ALT 20 19 19   ALKPHOS 74 76 67  BILITOT 0.6 0.7 0.9  PROT 6.5 6.5 6.7  ALBUMIN 2.8* 2.9* 2.8*   No results for input(s): LIPASE, AMYLASE in the last 168 hours. No results for input(s): AMMONIA in the last 168 hours. CBC: Recent Labs  Lab 04/15/19 1042 04/16/19 0952 04/17/19 0418  WBC 4.5 3.7* 3.5*  NEUTROABS 2.9 1.8 1.5*  HGB 13.4 13.2 13.1  HCT 40.4 40.3 39.4  MCV 84.5 85.0 84.7  PLT 189 172 167    Cardiac Enzymes: Recent Labs  Lab 04/15/19 1042  TROPONINI <0.03   BNP: Invalid input(s): POCBNP CBG: No results for input(s): GLUCAP in the last 168 hours. D-Dimer Recent Labs    04/15/19 1042  DDIMER 0.95*   Hgb A1c No results for input(s): HGBA1C in the last 72 hours. Lipid Profile Recent Labs    04/15/19 1042  TRIG 110   Thyroid function studies No results for input(s): TSH, T4TOTAL, T3FREE, THYROIDAB in the last 72 hours.  Invalid input(s): FREET3 Anemia work up Recent Labs    04/15/19 1042  FERRITIN 58   Urinalysis    Component Value Date/Time   COLORURINE STRAW (A) 12/14/2017 0144   APPEARANCEUR CLEAR 12/14/2017 0144   LABSPEC 1.008 12/14/2017 0144   PHURINE 7.0 12/14/2017 0144   GLUCOSEU NEGATIVE 12/14/2017 0144   HGBUR NEGATIVE 12/14/2017 0144   BILIRUBINUR NEGATIVE 12/14/2017 0144   KETONESUR NEGATIVE 12/14/2017 0144   PROTEINUR NEGATIVE 12/14/2017 0144   UROBILINOGEN 1.0 08/28/2015 0907   NITRITE NEGATIVE 12/14/2017 0144   LEUKOCYTESUR MODERATE (A) 12/14/2017 0144   Sepsis Labs Invalid input(s): PROCALCITONIN,  WBC,  LACTICIDVEN Microbiology Recent Results (from the past 240 hour(s))  SARS Coronavirus 2 [facility] order, Performed in [place] hospital lab)     Status: Abnormal   Collection Time: 04/15/19 10:42 AM  Result Value Ref Range Status   SARS Coronavirus 2 POSITIVE (A) NEGATIVE Final    Comment: CRITICAL RESULT CALLED TO, READ BACK BY AND VERIFIED WITH: RN MARLOW, C 1328 [id number] FCP (NOTE) If result is NEGATIVE SARS-CoV-2 target nucleic acids are NOT DETECTED. The SARS-CoV-2 RNA is generally detectable in upper and  lower  respiratory specimens during the acute phase of infection. The lowest  concentration of SARS-CoV-2 viral copies this assay can detect is 250  copies / mL. A negative result does not preclude SARS-CoV-2 infection  and should not be used as the sole basis for treatment or other  patient management decisions.   A negative result may occur with  improper specimen collection / handling, submission of specimen other  than nasopharyngeal swab, presence of viral mutation(s) within the  areas targeted by this assay, and inadequate number of viral copies  (<250 copies / mL). A negative result must be combined with clinical  observations, patient history, and epidemiological information. If result is POSITIVE SARS-CoV-2 target nucleic acids are DETECTED.  The SARS-CoV-2 RNA is generally detectable in upper and lower  respiratory specimens during the acute phase of infection.  Positive  results are indicative of active infection with SARS-CoV-2.  Clinical  correlation with patient history and other diagnostic information is  necessary to determine patient infection status.  Positive results do  not rule out bacterial infection or co-infection with other viruses. If result is PRESUMPTIVE POSTIVE SARS-CoV-2 nucleic acids MAY BE PRESENT.   A presumptive positive result was obtained on the submitted specimen  and confirmed on repeat testing.  While 2019 novel coronavirus  (SARS-CoV-2) nucleic acids may be present in the submitted sample  additional confirmatory testing may be necessary for epidemiological  and / or clinical management purposes  to differentiate between  SARS-CoV-2 and other Sarbecovirus currently known to infect humans.  If clinically indicated additional testing with an alternate test  methodology [phone])  is advised. The SARS-CoV-2 RNA is generally  detectable in upper and lower respiratory specimens during the acute  phase of infection. The expected result is Negative. Fact Sheet for Patients:  StrictlyIdeas.no Fact Sheet for Healthcare Providers: BankingDealers.co.za This test is not yet approved or cleared by the [place] FDA and has been authorized for detection and/or diagnosis of SARS-CoV-2 by FDA under an Emergency Use  Authorization (EUA).  This EUA will remain in effect (meaning this test can be used) for the duration of the COVID-19 declaration under Section 564(b)(1) of the Act, 21 U.S.C. section 360bbb-3(b)(1), unless the authorization is terminated or revoked sooner. Performed at [facility], [place] [address], [place], [place] [address]   Blood Culture (routine x 2)     Status: None (Preliminary result)   Collection Time: 04/15/19 11:00 AM  Result Value Ref Range Status   Specimen Description BLOOD LEFT WRIST  Final   Special Requests   Final    BOTTLES DRAWN AEROBIC AND ANAEROBIC Blood Culture results may not be optimal due to an excessive volume of blood received in culture bottles   Culture   Final    NO GROWTH < 24 HOURS Performed at [facility], [place] [address] [place], [place] [address]    Report Status PENDING  Incomplete  Blood Culture (routine x 2)     Status: None (Preliminary result)   Collection Time: 04/15/19 11:11 AM  Result Value Ref Range Status   Specimen Description BLOOD RIGHT HAND  Final   Special Requests AEROBIC BOTTLE ONLY Blood Culture adequate volume  Final   Culture   Final    NO GROWTH < 24 HOURS Performed at [facility], [place] [address] [place], [place] [address]    Report Status PENDING  Incomplete  Surgical PCR screen     Status: None  Collection Time: 04/16/19  2:25 PM  Result Value Ref Range Status   MRSA, PCR NEGATIVE NEGATIVE Final   Staphylococcus aureus NEGATIVE NEGATIVE Final    Comment: (NOTE) The Xpert SA Assay (FDA approved for NASAL specimens in patients 45 years of age and older), is one component of a comprehensive surveillance program. It is not intended to diagnose infection nor to guide or monitor treatment. Performed at [facility], [place] [address] [place], [place] [address]      Time coordinating discharge:  33 minutes  SIGNED:   Georgette Shell, MD  [facility] 04/17/2019, 9:59 AM  If  7PM-7AM, please contact night-coverage [email or web address] Password [email or web address]

## 2019-04-17 NOTE — Progress Notes (Signed)
Orthopedic Tech Progress Note Patient Details:  Meagan Ryan [birth year] [id number] Patient had on [place] and not a regular arm sling on..help patient get adjusted in bed and left after applying correct sling. Ortho Devices Type of Ortho Device: Arm sling Ortho Device/Splint Location: ule Ortho Device/Splint Interventions: Application, Ordered   Post Interventions Patient Tolerated: Well Instructions Provided: Care of device, Adjustment of device   [name] 04/17/2019, 9:27 AM

## 2019-04-18 ENCOUNTER — Telehealth: Payer: Self-pay | Admitting: Cardiology

## 2019-04-18 NOTE — Telephone Encounter (Signed)
Patient's daughter Malachy Mood called she would like to go over medication list on her mother's discharge instructions.

## 2019-04-18 NOTE — Telephone Encounter (Signed)
Advised to continue Carvedilol, per Dr. Curt Bears Pt agreeable to plan

## 2019-04-18 NOTE — Telephone Encounter (Signed)
Spoke with the patient's daughter, she stated that she received a call from the hospitalist the other day stating the patient needed to stop her carvedilol. The she reviewed the discharge info and saw it was still prescribed. She wanted to know if she is supposed to stop or continue Carvedilol.

## 2019-04-20 LAB — CULTURE, BLOOD (ROUTINE X 2)
Culture: NO GROWTH
Culture: NO GROWTH
Special Requests: ADEQUATE

## 2019-04-26 ENCOUNTER — Telehealth (INDEPENDENT_AMBULATORY_CARE_PROVIDER_SITE_OTHER): Admitting: *Deleted

## 2019-04-26 DIAGNOSIS — I441 Atrioventricular block, second degree: Secondary | ICD-10-CM

## 2019-04-26 DIAGNOSIS — Z950 Presence of cardiac pacemaker: Secondary | ICD-10-CM

## 2019-04-26 LAB — CUP PACEART REMOTE DEVICE CHECK
Battery Remaining Longevity: 136 mo
Battery Voltage: 3.19 V
Brady Statistic AP VP Percent: 15.18 %
Brady Statistic AP VS Percent: 0.01 %
Brady Statistic AS VP Percent: 84.77 %
Brady Statistic AS VS Percent: 0.03 %
Brady Statistic RA Percent Paced: 15.15 %
Brady Statistic RV Percent Paced: 99.95 %
Date Time Interrogation Session: 20200618163406
Implantable Lead Implant Date: 20200608
Implantable Lead Implant Date: 20200608
Implantable Lead Location: 753859
Implantable Lead Location: 753860
Implantable Lead Model: 5076
Implantable Lead Model: 5076
Implantable Pulse Generator Implant Date: 20200608
Lead Channel Impedance Value: 361 Ohm
Lead Channel Impedance Value: 380 Ohm
Lead Channel Impedance Value: 475 Ohm
Lead Channel Impedance Value: 513 Ohm
Lead Channel Pacing Threshold Amplitude: 0.75 V
Lead Channel Pacing Threshold Amplitude: 1.125 V
Lead Channel Pacing Threshold Pulse Width: 0.4 ms
Lead Channel Pacing Threshold Pulse Width: 0.4 ms
Lead Channel Sensing Intrinsic Amplitude: 3.125 mV
Lead Channel Sensing Intrinsic Amplitude: 3.125 mV
Lead Channel Sensing Intrinsic Amplitude: 5.25 mV
Lead Channel Setting Pacing Amplitude: 3.5 V
Lead Channel Setting Pacing Amplitude: 3.5 V
Lead Channel Setting Pacing Pulse Width: 0.4 ms
Lead Channel Setting Sensing Sensitivity: 2.8 mV

## 2019-04-26 NOTE — Progress Notes (Signed)
Patient verbally consented to pacemaker wound check via virtual visit due to Covid-19.  Pacemaker wound check via video visit (had to switch to FaceTime due to difficulties with MyChart video). Steri-strips removed by patient. Wound without redness or edema. Incision edges appear fully approximated, wound healing well. Normal device function. Automatic thresholds, sensing, and impedance trends stable. Device programmed at 3.5V with auto capture programmed on for extra safety margin until 3 month visit. Histogram distribution appropriate for patient and level of activity. No mode switches or high ventricular rates noted. Patient educated about wound care, arm mobility, lifting restrictions, and Carelink monitor. ROV with WC on 07/17/19.  AVS, monitor instructions, and post-implant instructions mailed to patient's home address.

## 2019-04-28 ENCOUNTER — Telehealth: Admitting: Nurse Practitioner

## 2019-04-28 DIAGNOSIS — N3000 Acute cystitis without hematuria: Secondary | ICD-10-CM

## 2019-04-28 MED ORDER — DOXYCYCLINE HYCLATE 100 MG PO TABS: 100.0000 mg | ORAL_TABLET | Freq: Two times a day (BID) | ORAL | 0 refills | Status: DC

## 2019-04-28 NOTE — Progress Notes (Signed)
We are sorry that you are not feeling well.  Here is how we plan to help!  Based on what you shared with me it looks like you most likely have a simple urinary tract infection.  A UTI (Urinary Tract Infection) is a bacterial infection of the bladder.  Most cases of urinary tract infections are simple to treat but a key part of your care is to encourage you to drink plenty of fluids and watch your symptoms carefully.  I have prescribed doxycycline 100mg  1 po BID for 10 days..  Your symptoms should gradually improve. Call us if the burning in your urine worsens, you develop worsening fever, back pain or pelvic pain or if your symptoms do not resolve after completing the antibiotic.  Urinary tract infections can be prevented by drinking plenty of water to keep your body hydrated.  Also be sure when you wipe, wipe from front to back and don't hold it in!  If possible, empty your bladder every 4 hours.  Your e-visit answers were reviewed by a board certified advanced clinical practitioner to complete your personal care plan.  Depending on the condition, your plan could have included both over the counter or prescription medications.  If there is a problem please reply  once you have received a response from your provider.  Your safety is important to [place].  If you have drug allergies check your prescription carefully.    You can use MyChart to ask questions about today's visit, request a non-urgent call back, or ask for a work or school excuse for 24 hours related to this e-Visit. If it has been greater than 24 hours you will need to follow up with your provider, or enter a new e-Visit to address those concerns.  You will get an e-mail in the next two days asking about your experience.  I hope that your e-visit has been valuable and will speed your recovery. Thank you for using e-visits.  5-10 minutes spent reviewing and documenting in chart.

## 2019-05-29 ENCOUNTER — Other Ambulatory Visit: Payer: Self-pay | Admitting: Family Medicine

## 2019-05-29 DIAGNOSIS — M8580 Other specified disorders of bone density and structure, unspecified site: Secondary | ICD-10-CM

## 2019-06-11 NOTE — Progress Notes (Signed)
[place]   Telephone:([phone]) [phone] Fax:(336) [phone]   Clinic Follow up Note   Patient Care Team: Hulan Fess, MD as PCP - General (Family Medicine) Sueanne Margarita, MD as Consulting Physician (Cardiology) Gery Pray, MD as Consulting Physician (Radiation Oncology) Excell Seltzer, MD as Consulting Physician (General Surgery) Truitt Merle, MD as Consulting Physician (Hematology) Delice Bison Charlestine Massed, NP as Nurse Practitioner (Hematology and Oncology)  Date of Service:  06/14/2019  CHIEF COMPLAINT: F/u of right breast cancer  SUMMARY OF ONCOLOGIC HISTORY: Oncology History Overview Note  Cancer Staging Malignant neoplasm of upper-outer quadrant of right breast in female, estrogen receptor positive ([place]) Staging form: Breast, AJCC 8th Edition - Clinical stage from 02/21/2018: Stage IA (cT1b, cN0, cM0, G1, ER+, PR+, HER2-) - Signed by Truitt Merle, MD on 03/01/2018     Malignant neoplasm of upper-outer quadrant of right breast in female, estrogen receptor positive ([place])  12/25/2015 Imaging   12/25/2015 Digital Screen Unilat R IMPRESSION: Further evaluation is suggested for possible distortion in the right breast.   01/02/2016 Imaging   01/02/2016 MM DIAG Breast TOMO Uni Right IMPRESSION: Further evaluation is suggested for possible distortion in the right breast.   01/12/2017 Imaging   01/12/2017 MM Digital Screening Unilat R IMPRESSION: No mammographic evidence of malignancy. A result letter of this screening mammogram will be mailed directly to the patient.   02/07/2018 Imaging   02/07/2018 MM 3D Screen Breast UNI Right IMPRESSION: Further evaluation is suggested for possible mass in the right breast.    02/16/2018 Mammogram   IMPRESSION: Irregular hypoechoic mass in the RIGHT breast at the 12 o'clock axis, retroareolar, measuring 7 mm, corresponding to the new mass seen on mammogram. This is a suspicious finding for which ultrasound-guided biopsy  is recommended.   02/21/2018 Initial Biopsy   Diagnosis 02/21/18 Breast, right, needle core biopsy, 12 o'clock - INVASIVE DUCTAL CARCINOMA. - DUCTAL CARCINOMA IN SITU. - SEE COMMENT.   02/21/2018 Receptors her2   Prognostic indicators significant for: ER, 100% positive and PR, 100% positive or negative, both with strong staining intensity. Proliferation marker Ki67 at 5%. HER2 negative.   02/21/2018 Cancer Staging   Staging form: Breast, AJCC 8th Edition - Clinical stage from 02/21/2018: Stage IA (cT1b, cN0, cM0, G1, ER+, PR+, HER2-) - Signed by Truitt Merle, MD on 03/01/2018   02/21/2018 Imaging   02/21/2018 [place] RT Breast BX W LOC DEV 1st Lesion IMG BX SPEC US Guide IMPRESSION: Ultrasound-guided biopsy of the RIGHT breast mass at the 12 o'clock axis. No apparent complications   5/97/4163 Initial Diagnosis   Malignant neoplasm of upper-outer quadrant of right breast in female, estrogen receptor positive ([place])   03/31/2018 Surgery   03/31/2018 Right total mastectomy 63 year old female with a new diagnosis of cancer of the right breast, upper outer quadrant. Clinical stage one a, ER positive, PR positive, HER-2 negative.  She has a remote history of left breast cancer treated with modified mastectomy and TRAM flap reconstruction in the 1990s.  After discussion of treatment options and risks extensively detailed elsewhere she has elected to proceed with right total mastectomy and axillary sentinel lymph node biopsy as initial surgical therapy.   03/31/2018 Pathology Results   03/31/2018 Diagnosis 1. Breast, simple mastectomy, Right Total - INVASIVE AND IN SITU DUCTAL CARCINOMA, 0.6 CM. - MARGINS NOT INVOLVED. - FIBROCYSTIC CHANGES. - BIOPSY SITE AND BIOPSY CLIP. 2. Lymph node, sentinel, biopsy, Right Axillary #1 - ONE BENIGN LYMPH NODE (0/1). 3. Lymph node,  sentinel, biopsy, Right Axillary #2 - ONE BENIGN LYMPH NODE (0/1).    05/01/2018 -  Anti-estrogen oral therapy   Discontinued Evista.  Anastrozole 1 mg daily       CURRENT THERAPY:  Anastrozole 49m daily, started 04/2018  INTERVAL HISTORY:  Meagan LUDDENis here for a follow up right breast cancer. She presents to the clinic alone. She notes she had COVID in late 03/2019 along with her family which was complicated. She thinks they contracted it from her daughter's orthopedic office. She had to get pace maker placed. She notes still being fatigued which has not improved. She notes her hair and her daughter's is starting to fall out. She denies any SOB or chest pain currently.  She notes she is still taking anastrozole and tolerating well. She notes her PCP took her off Vitamin D. She plans to have DEXA on 9/28. Her last one showed osteoporosis. She notes since her prior chemo she started expericning arthritis. This is manageable.     REVIEW OF SYSTEMS:   Constitutional: Denies fevers, chills or abnormal weight loss Eyes: Denies blurriness of vision Ears, nose, mouth, throat, and face: Denies mucositis or sore throat Respiratory: Denies cough, dyspnea or wheezes  Cardiovascular: Denies palpitation, chest discomfort or lower extremity swelling (+) Pacemaker in place  Gastrointestinal:  Denies nausea, heartburn or change in bowel habits Skin: Denies abnormal skin rashes MSK: (+) Manageable arthritis  Lymphatics: Denies new lymphadenopathy or easy bruising Neurological:Denies numbness, tingling or new weaknesses Behavioral/Psych: Mood is stable, no new changes  All other systems were reviewed with the patient and are negative.  MEDICAL HISTORY:  Past Medical History:  Diagnosis Date  . Allergic rhinitis   . Asthma   . [place] [place]   Left breast-stage II infiltrating ductal carcinoma, 3/8 axillary lymph nodes were positive for metastatic carcinoma, getting yearly mammogram and every other year MRI followed by oncologist Dr SBenay Spice . Complication of anesthesia    WAKE UP WITH HEADACHE, difficulty waking up   . DCM (dilated cardiomyopathy) ([facility]    EF normalized at 60-65% on echo 07/2016  . Dysuria 2011  . Family history of adverse reaction to anesthesia    "family wakes up with personality changes, but are very short term"  . Fatty liver CT 1/12   elevated LFT  . Genetic testing 03/13/2018   Common cancers panel (47 genes) @ [facility] - No pathogenic mutations detected  . H/O fibromyalgia   . H/O osteopenia 2009  . H/O peripheral neuropathy   . H/O varicose veins   . Hyperlipidemia   . Hypertension   . Incomplete RBBB   . 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 OSA (obstructive sleep apnea) 12/31/2015   Very mild with AHI 6/hr but elevated RDI at 32/hr.  Intolerant to PAP therapy  . Pelvic pain in female   . PMB (postmenopausal bleeding) 2011  . Pregnancy induced hypertension   . Right-sided lacunar infarction [place]    MRI 1.2012  . Tubular adenoma of colon     SURGICAL HISTORY: Past Surgical History:  Procedure Laterality Date  . Gastroc muscle repair  2007  . LAPAROSCOPIC CHOLECYSTECTOMY    . MASTECTOMY  06/1995   Left - for infiltrating ductal carcinoma  . MASTECTOMY W/ SENTINEL NODE BIOPSY Right 03/31/2018  . MASTECTOMY W/ SENTINEL NODE BIOPSY Right 03/31/2018   Procedure: RIGHT TOTAL MASTECTOMY WITH AXILLARY SENTINEL LYMPH NODE BIOPSY ERAS PATHWAY;  Surgeon:  Excell Seltzer, MD;  Location: [facility];  Service: General;  Laterality: Right;  . 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 PACEMAKER IMPLANT N/A 04/16/2019   Procedure: PACEMAKER IMPLANT;  Surgeon: Constance Haw, MD;  Location: [facility];  Service: Cardiovascular;  Laterality: N/A;  . ROBOTIC ASSISTED TOTAL HYSTERECTOMY WITH BILATERAL SALPINGO OOPHERECTOMY Bilateral 10/09/2015   Procedure: ROBOTIC ASSISTED BILATERAL SALPINGO OOPHORECTOMY ;  Surgeon: Everitt Amber, MD;  Location: [facility];  Service: Gynecology;  Laterality: Bilateral;  . Tram surgery  1996  . TUBAL LIGATION      I have  reviewed the social history and family history with the patient and they are unchanged from previous note.  ALLERGIES:  is allergic to amlodipine; noroxin [norfloxacin]; cephalexin; chocolate; codeine; sulfa antibiotics; latex; and penicillins.  MEDICATIONS:  Current Outpatient Medications  Medication Sig Dispense Refill  . ALPRAZolam (XANAX) 0.5 MG tablet Take 0.5 mg by mouth 2 (two) times daily.     Marland Kitchen anastrozole (ARIMIDEX) 1 MG tablet Take 1 tablet (1 mg total) by mouth daily. 90 tablet 4  . azelastine (ASTELIN) 0.1 % nasal spray Place 1 spray into both nostrils 2 (two) times daily as needed for rhinitis or allergies.     . carvedilol (COREG) 3.125 MG tablet Take 1 tablet (3.125 mg total) by mouth 2 (two) times daily with a meal. Please keep upcoming appt with Dr. Radford Pax. Thank you 180 tablet 0  . cetirizine (ZYRTEC) 10 MG tablet Take 10 mg by mouth every morning.     . cholecalciferol (VITAMIN D3) 25 MCG (1000 UT) tablet Take 2,000 Units by mouth daily.    Marland Kitchen doxycycline (VIBRA-TABS) 100 MG tablet Take 1 tablet (100 mg total) by mouth 2 (two) times daily. 1 po bid 20 tablet 0  . DULoxetine (CYMBALTA) 30 MG capsule Take 30 mg by mouth at bedtime.     . fluticasone (FLONASE) 50 MCG/ACT nasal spray Place 1 spray into both nostrils every morning.     . gabapentin (NEURONTIN) 300 MG capsule Take 300 mg by mouth 2 (two) times daily.    . ivabradine (CORLANOR) 5 MG TABS tablet Take 1 tablet (5 mg total) by mouth 2 (two) times daily with a meal. 180 tablet 3  . Multiple Vitamins-Minerals (AIRBORNE GUMMIES) CHEW Chew 2 tablets by mouth daily.    . niacin 500 MG tablet Take 500 mg by mouth daily.    . nortriptyline (PAMELOR) 25 MG capsule Take 25 mg by mouth at bedtime as needed for sleep.     . Omega-3 Fatty Acids (FISH OIL) 1000 MG CAPS Take 2,000 mg by mouth 2 (two) times daily.    Marland Kitchen omeprazole (PRILOSEC) 20 MG capsule Take 20 mg by mouth daily.    . potassium chloride (K-DUR) 10 MEQ tablet Take  10 mEq by mouth daily.    . sacubitril-valsartan (ENTRESTO) 24-26 MG Take 1 tablet by mouth 2 (two) times daily. 180 tablet 3   No current facility-administered medications for this visit.     PHYSICAL EXAMINATION: ECOG PERFORMANCE STATUS: 1 - Symptomatic but completely ambulatory  Vitals:   06/14/19 1342  BP: (!) 165/72  Pulse: 70  Resp: 17  Temp: 98.5 F (36.9 C)  SpO2: 96%   Filed Weights   06/14/19 1342  Weight: 240 lb 1.6 oz (108.9 kg)    GENERAL:alert, no distress and comfortable SKIN: skin color, texture, turgor are normal, no rashes or significant lesions EYES: normal,  Conjunctiva are pink and non-injected, sclera clear NECK: supple, thyroid normal size, non-tender, without nodularity LYMPH:  no palpable lymphadenopathy in the cervical, axillary  LUNGS: clear to auscultation and percussion with normal breathing effort HEART: regular rate & rhythm and no murmurs and no lower extremity edema ABDOMEN:abdomen soft, non-tender and normal bowel sounds Musculoskeletal:no cyanosis of digits and no clubbing  NEURO: alert & oriented x 3 with fluent speech, no focal motor/sensory deficits BREAST: s/p b/l mastectomies and left reconstruction: Surgical incision healed well. (+) Very mild scar tissue around incision. No palpable mass, nodules or adenopathy bilaterally. Breast exam benign.   LABORATORY DATA:  I have reviewed the data as listed CBC Latest Ref Rng & Units 06/14/2019 04/17/2019 04/16/2019  WBC 4.0 - 10.5 K/uL 5.4 3.5(L) 3.7(L)  Hemoglobin 12.0 - 15.0 g/dL 12.1 13.1 13.2  Hematocrit 36.0 - 46.0 % 38.1 39.4 40.3  Platelets 150 - 400 K/uL 250 167 172     CMP Latest Ref Rng & Units 06/14/2019 04/17/2019 04/16/2019  Glucose 70 - 99 mg/dL 116(H) 93 130(H)  BUN 8 - 23 mg/dL 7(L) 6(L) 7(L)  Creatinine 0.44 - 1.00 mg/dL 0.91 0.90 0.98  Sodium 135 - 145 mmol/L 139 134(L) 132(L)  Potassium 3.5 - 5.1 mmol/L 4.3 3.5 3.7  Chloride 98 - 111 mmol/L 103 100 98  CO2 22 - 32 mmol/L 26 23  24   Calcium 8.9 - 10.3 mg/dL 9.6 8.9 8.9  Total Protein 6.5 - 8.1 g/dL 7.6 6.7 6.5  Total Bilirubin 0.3 - 1.2 mg/dL 0.6 0.9 0.7  Alkaline Phos 38 - 126 U/L 99 67 76  AST 15 - 41 U/L 26 32 31  ALT 0 - 44 U/L 21 19 19       RADIOGRAPHIC STUDIES: I have personally reviewed the radiological images as listed and agreed with the findings in the report. No results found.   ASSESSMENT & PLAN:  Meagan Ryan is a 63 y.o. female with   1. Malignant neoplasm of upper-outer quadrant of right breast, Invasive Ductal Carcinoma and DCIS, pT1bN0M0 stage IA, grade I, ER/PR positive, HER2 negative -Diagnosed in 12/2015. Treated with right mastectomy. Currently on anastrozole 1 mg daily, started in 04/2018. Tolerating well, no exacerbation of her manageable arthritis.  -She has attended survivorship clinic previously  -From a breast cancer standpoint she is clinically doing well. Labs reviewed, CBC and CMP WNL except BG 116, BUN 7, Albumin 3.4. Physical exam unremarkable.  -She is s/p b/l mastectomy and does not need f/u mammograms. There is no clinical concern for recurrence -She was osteopenic in 10/2014. She will continue to f/u with PCP for DEXA scan, set for 08/06/19. Will repeat every 2 years.  -Continue anastrozole  -Lab and f/u in 6 months   2. Previous left breast cancer in 1996, s/p left mastectomy, chemo, reconstruction surgery and Evista -No evidence of recurrence. Currently on Anastrozole for right breast cancer.  3. Obesitywith weight gain, OSA and dilated cardiomyopathy  -f/u with PCP and cardiologist Dr. Radford Pax. -She is gaining weight and upset about it.  -Her LL neuropathy and Menire disease limit her ability to exercise.  -Her weight is currently maintained, but she struggles with losing weight.  -I again strongly encouraged her to cut carbohydrates in her diet and increase in protein with less red meat and more chicken. I offered her the chance to attend [facility] healthy weight  management clinic. She is declined for now.    4. Recent Lower respiratory infection, secondary to  COVID-19 -She tested positive in late 03/2019. Her whole family had COVID and they all survived.  -Her PNTIR-44 course was complicated by heart block and she was hospitalized. She had pacemaker placed on 04/16/19. She has since recovered.  -She continues to have residual fatigue and recent hair loss but no other COVID-19 symptoms    Plan -She is clinically stable.  -Continue Anastrozole, refilled today   -DEXA with Dr. Rex Kras on 08/06/19 -Lab and f/u in 6 months    No problem-specific Assessment & Plan notes found for this encounter.   No orders of the defined types were placed in this encounter.  All questions were answered. The patient knows to call the clinic with any problems, questions or concerns. No barriers to learning was detected. I spent 15 minutes counseling the patient face to face. The total time spent in the appointment was 20 minutes and more than 50% was on counseling and review of test results     Truitt Merle, MD 06/14/2019   I, Joslyn Devon, am acting as scribe for Truitt Merle, MD.   I have reviewed the above documentation for accuracy and completeness, and I agree with the above.

## 2019-06-14 ENCOUNTER — Inpatient Hospital Stay: Attending: Hematology

## 2019-06-14 ENCOUNTER — Inpatient Hospital Stay (HOSPITAL_BASED_OUTPATIENT_CLINIC_OR_DEPARTMENT_OTHER): Admitting: Hematology

## 2019-06-14 ENCOUNTER — Encounter: Payer: Self-pay | Admitting: Hematology

## 2019-06-14 ENCOUNTER — Telehealth: Payer: Self-pay | Admitting: Hematology

## 2019-06-14 ENCOUNTER — Other Ambulatory Visit: Payer: Self-pay

## 2019-06-14 VITALS — BP 165/72 | HR 70 | Temp 98.5°F | Resp 17 | Ht 64.0 in | Wt 240.1 lb

## 2019-06-14 DIAGNOSIS — C50411 Malignant neoplasm of upper-outer quadrant of right female breast: Secondary | ICD-10-CM | POA: Diagnosis present

## 2019-06-14 DIAGNOSIS — M8580 Other specified disorders of bone density and structure, unspecified site: Secondary | ICD-10-CM | POA: Insufficient documentation

## 2019-06-14 DIAGNOSIS — Z170 Estrogen receptor positive status [ER+]: Secondary | ICD-10-CM

## 2019-06-14 DIAGNOSIS — Z79899 Other long term (current) drug therapy: Secondary | ICD-10-CM | POA: Diagnosis not present

## 2019-06-14 DIAGNOSIS — Z905 Acquired absence of kidney: Secondary | ICD-10-CM | POA: Diagnosis not present

## 2019-06-14 DIAGNOSIS — J45909 Unspecified asthma, uncomplicated: Secondary | ICD-10-CM | POA: Diagnosis not present

## 2019-06-14 DIAGNOSIS — I10 Essential (primary) hypertension: Secondary | ICD-10-CM | POA: Insufficient documentation

## 2019-06-14 DIAGNOSIS — E785 Hyperlipidemia, unspecified: Secondary | ICD-10-CM | POA: Diagnosis not present

## 2019-06-14 DIAGNOSIS — Z8673 Personal history of transient ischemic attack (TIA), and cerebral infarction without residual deficits: Secondary | ICD-10-CM | POA: Insufficient documentation

## 2019-06-14 DIAGNOSIS — Z9013 Acquired absence of bilateral breasts and nipples: Secondary | ICD-10-CM | POA: Diagnosis not present

## 2019-06-14 DIAGNOSIS — Z950 Presence of cardiac pacemaker: Secondary | ICD-10-CM | POA: Insufficient documentation

## 2019-06-14 DIAGNOSIS — Z79811 Long term (current) use of aromatase inhibitors: Secondary | ICD-10-CM | POA: Insufficient documentation

## 2019-06-14 DIAGNOSIS — G4733 Obstructive sleep apnea (adult) (pediatric): Secondary | ICD-10-CM | POA: Diagnosis not present

## 2019-06-14 LAB — CMP (CANCER CENTER ONLY)
ALT: 21 U/L (ref 0–44)
AST: 26 U/L (ref 15–41)
Albumin: 3.4 g/dL — ABNORMAL LOW (ref 3.5–5.0)
Alkaline Phosphatase: 99 U/L (ref 38–126)
Anion gap: 10 (ref 5–15)
BUN: 7 mg/dL — ABNORMAL LOW (ref 8–23)
CO2: 26 mmol/L (ref 22–32)
Calcium: 9.6 mg/dL (ref 8.9–10.3)
Chloride: 103 mmol/L (ref 98–111)
Creatinine: 0.91 mg/dL (ref 0.44–1.00)
GFR, Est AFR Am: >60 mL/min (ref >60)
GFR, Estimated: >60 mL/min (ref >60)
Glucose, Bld: 116 mg/dL — ABNORMAL HIGH (ref 70–99)
Potassium: 4.3 mmol/L (ref 3.5–5.1)
Sodium: 139 mmol/L (ref 135–145)
Total Bilirubin: 0.6 mg/dL (ref 0.3–1.2)
Total Protein: 7.6 g/dL (ref 6.5–8.1)

## 2019-06-14 LAB — CBC WITH DIFFERENTIAL (CANCER CENTER ONLY)
Abs Immature Granulocytes: 0.01 10*3/uL (ref 0.00–0.07)
Basophils Absolute: 0 10*3/uL (ref 0.0–0.1)
Basophils Relative: 1 %
Eosinophils Absolute: 0.3 10*3/uL (ref 0.0–0.5)
Eosinophils Relative: 5 %
HCT: 38.1 % (ref 36.0–46.0)
Hemoglobin: 12.1 g/dL (ref 12.0–15.0)
Immature Granulocytes: 0 %
Lymphocytes Relative: 38 %
Lymphs Abs: 2 10*3/uL (ref 0.7–4.0)
MCH: 27.8 pg (ref 26.0–34.0)
MCHC: 31.8 g/dL (ref 30.0–36.0)
MCV: 87.6 fL (ref 80.0–100.0)
Monocytes Absolute: 0.6 10*3/uL (ref 0.1–1.0)
Monocytes Relative: 11 %
Neutro Abs: 2.5 10*3/uL (ref 1.7–7.7)
Neutrophils Relative %: 45 %
Platelet Count: 250 10*3/uL (ref 150–400)
RBC: 4.35 MIL/uL (ref 3.87–5.11)
RDW: 15.6 % — ABNORMAL HIGH (ref 11.5–15.5)
WBC Count: 5.4 10*3/uL (ref 4.0–10.5)
nRBC: 0 % (ref 0.0–0.2)

## 2019-06-14 MED ORDER — ANASTROZOLE 1 MG PO TABS: 1.0000 mg | ORAL_TABLET | Freq: Every day | ORAL | 4 refills | Status: DC

## 2019-06-14 NOTE — Telephone Encounter (Signed)
Scheduled appt per 8/6 los. ° °Printed and mailed appt calendar. °

## 2019-06-22 ENCOUNTER — Other Ambulatory Visit: Payer: Self-pay | Admitting: Cardiology

## 2019-07-17 ENCOUNTER — Ambulatory Visit (INDEPENDENT_AMBULATORY_CARE_PROVIDER_SITE_OTHER): Admitting: Cardiology

## 2019-07-17 ENCOUNTER — Encounter: Payer: Self-pay | Admitting: Cardiology

## 2019-07-17 ENCOUNTER — Other Ambulatory Visit: Payer: Self-pay

## 2019-07-17 ENCOUNTER — Ambulatory Visit (INDEPENDENT_AMBULATORY_CARE_PROVIDER_SITE_OTHER): Admitting: *Deleted

## 2019-07-17 VITALS — BP 118/86 | HR 71 | Ht 64.0 in | Wt 237.6 lb

## 2019-07-17 DIAGNOSIS — I441 Atrioventricular block, second degree: Secondary | ICD-10-CM

## 2019-07-17 LAB — CUP PACEART REMOTE DEVICE CHECK
Battery Remaining Longevity: 134 mo
Battery Voltage: 3.13 V
Brady Statistic AP VP Percent: 6.76 %
Brady Statistic AP VS Percent: 0.01 %
Brady Statistic AS VP Percent: 93.01 %
Brady Statistic AS VS Percent: 0.22 %
Brady Statistic RA Percent Paced: 6.8 %
Brady Statistic RV Percent Paced: 99.77 %
Date Time Interrogation Session: 20200908031139
Implantable Lead Implant Date: 20200608
Implantable Lead Implant Date: 20200608
Implantable Lead Location: 753859
Implantable Lead Location: 753860
Implantable Lead Model: 5076
Implantable Lead Model: 5076
Implantable Pulse Generator Implant Date: 20200608
Lead Channel Impedance Value: 342 Ohm
Lead Channel Impedance Value: 399 Ohm
Lead Channel Impedance Value: 399 Ohm
Lead Channel Impedance Value: 475 Ohm
Lead Channel Pacing Threshold Amplitude: 0.625 V
Lead Channel Pacing Threshold Amplitude: 0.75 V
Lead Channel Pacing Threshold Pulse Width: 0.4 ms
Lead Channel Pacing Threshold Pulse Width: 0.4 ms
Lead Channel Sensing Intrinsic Amplitude: 3.25 mV
Lead Channel Sensing Intrinsic Amplitude: 3.25 mV
Lead Channel Sensing Intrinsic Amplitude: 5.25 mV
Lead Channel Setting Pacing Amplitude: 3.5 V
Lead Channel Setting Pacing Amplitude: 3.5 V
Lead Channel Setting Pacing Pulse Width: 0.4 ms
Lead Channel Setting Sensing Sensitivity: 2.8 mV

## 2019-07-17 NOTE — Patient Instructions (Signed)
Medication Instructions:  Your physician recommends that you continue on your current medications as directed. Please refer to the Current Medication list given to you today.  *If you need a refill on your cardiac medications before your next appointment, please call your pharmacy*  Labwork: None ordered If you have labs (blood work) drawn today and your tests are completely normal, you will receive your results only by:  [place] (if you have MyChart) OR  A paper copy in the mail If you have any lab test that is abnormal or we need to change your treatment, we will call you to review the results.  Testing/Procedures: None ordered  Follow-Up: Remote monitoring is used to monitor your Pacemaker or ICD from home. This monitoring reduces the number of office visits required to check your device to one time per year. It allows [place] to keep an eye on the functioning of your device to ensure it is working properly. You are scheduled for a device check from home on 10/15/19. You may send your transmission at any time that day. If you have a wireless device, the transmission will be sent automatically. After your physician reviews your transmission, you will receive a postcard with your next transmission date.  Your physician wants you to follow-up in: 9 months with Dr. Curt Bears.  You will receive a reminder letter in the mail two months in advance. If you don't receive a letter, please call our office to schedule the follow-up appointment.  Thank you for choosing [facility]!!   Trinidad Curet, RN [phone]  Any Other Special Instructions Will Be Listed Below (If Applicable).

## 2019-07-17 NOTE — Progress Notes (Signed)
Electrophysiology Office Note   Date:  07/17/2019   ID:  TODD GAMBEL, DOB 12/01/1955, MRN [id number]  PCP:  Hulan Fess, MD  Cardiologist:  Radford Pax Primary Electrophysiologist:  Larna Capelle Meredith Leeds, MD    Chief Complaint: pacemaker   History of Present Illness: Meagan Ryan is a 63 y.o. female who is being seen today for the evaluation of complete heart block at the request of Hulan Fess, MD. Presenting today for electrophysiology evaluation.  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  Today, she denies symptoms of palpitations, chest pain, shortness of breath, orthopnea, PND, lower extremity edema, claudication, dizziness, presyncope, syncope, bleeding, or neurologic sequela. The patient is tolerating medications without difficulties.  She is having some weakness and fatigue that is not improved since her device was implanted.  She currently has appropriate histograms, though she is conducting a to V.   Past Medical History:  Diagnosis Date  . Allergic rhinitis   . Asthma   . Breast cancer ([place]) 1996   Left breast-stage II infiltrating ductal carcinoma, 3/8 axillary lymph nodes were positive for metastatic carcinoma, getting yearly mammogram and every other year MRI followed by oncologist Dr Benay Spice  . Complication of anesthesia    WAKE UP WITH HEADACHE, difficulty waking up  . DCM (dilated cardiomyopathy) ([place])    EF normalized at 60-65% on echo 07/2016  . Dysuria 2011  . Family history of adverse reaction to anesthesia    "family wakes up with personality changes, but are very short term"  . Fatty liver CT 1/12   elevated LFT  . Genetic testing 03/13/2018   Common cancers panel (47 genes) @ [facility] - No pathogenic mutations detected  . H/O fibromyalgia   . H/O  osteopenia 2009  . H/O peripheral neuropathy   . H/O varicose veins   . Hyperlipidemia   . Hypertension   . Incomplete RBBB   . 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 OSA (obstructive sleep apnea) 12/31/2015   Very mild with AHI 6/hr but elevated RDI at 32/hr.  Intolerant to PAP therapy  . Pelvic pain in female   . PMB (postmenopausal bleeding) 2011  . Pregnancy induced hypertension   . Right-sided lacunar infarction ([place])    MRI 1.2012  . Tubular adenoma of colon    Past Surgical History:  Procedure Laterality Date  . Gastroc muscle repair  2007  . LAPAROSCOPIC CHOLECYSTECTOMY    . MASTECTOMY  06/1995   Left - for infiltrating ductal carcinoma  . MASTECTOMY W/ SENTINEL NODE BIOPSY Right 03/31/2018  . MASTECTOMY W/ SENTINEL NODE BIOPSY Right 03/31/2018   Procedure: RIGHT TOTAL MASTECTOMY WITH AXILLARY SENTINEL LYMPH NODE BIOPSY ERAS PATHWAY;  Surgeon: Excell Seltzer, MD;  Location: [facility];  Service: General;  Laterality: Right;  . 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 PACEMAKER IMPLANT N/A 04/16/2019   Procedure: PACEMAKER IMPLANT;  Surgeon: Constance Haw, MD;  Location: [facility];  Service: Cardiovascular;  Laterality: N/A;  . ROBOTIC ASSISTED TOTAL HYSTERECTOMY WITH BILATERAL SALPINGO OOPHERECTOMY Bilateral 10/09/2015   Procedure: ROBOTIC ASSISTED BILATERAL SALPINGO OOPHORECTOMY ;  Surgeon: Everitt Amber, MD;  Location: [facility];  Service: Gynecology;  Laterality: Bilateral;  . Tram surgery  1996  . TUBAL LIGATION  Current Outpatient Medications  Medication Sig Dispense Refill  . ALPRAZolam (XANAX) 0.5 MG tablet Take 0.5 mg by mouth 2 (two) times daily.     Marland Kitchen anastrozole (ARIMIDEX) 1 MG tablet Take 1 tablet (1 mg total) by mouth daily. 90 tablet 4  . azelastine (ASTELIN) 0.1 % nasal spray Place 1 spray into both nostrils 2 (two) times daily as needed for rhinitis or allergies.     . carvedilol (COREG) 3.125 MG tablet Take 1 tablet (3.125  mg total) by mouth 2 (two) times daily with a meal. 180 tablet 2  . cetirizine (ZYRTEC) 10 MG tablet Take 10 mg by mouth every morning.     Marland Kitchen doxycycline (VIBRA-TABS) 100 MG tablet Take 1 tablet (100 mg total) by mouth 2 (two) times daily. 1 po bid 20 tablet 0  . DULoxetine (CYMBALTA) 30 MG capsule Take 30 mg by mouth at bedtime.     . fluticasone (FLONASE) 50 MCG/ACT nasal spray Place 1 spray into both nostrils every morning.     . gabapentin (NEURONTIN) 300 MG capsule Take 300 mg by mouth 2 (two) times daily.    . ivabradine (CORLANOR) 5 MG TABS tablet Take 1 tablet (5 mg total) by mouth 2 (two) times daily with a meal. 180 tablet 3  . niacin 500 MG tablet Take 500 mg by mouth daily.    . nortriptyline (PAMELOR) 25 MG capsule Take 25 mg by mouth at bedtime as needed for sleep.     . Omega-3 Fatty Acids (FISH OIL) 1000 MG CAPS Take 2,000 mg by mouth 2 (two) times daily.    Marland Kitchen omeprazole (PRILOSEC) 20 MG capsule Take 20 mg by mouth daily.    . potassium chloride (K-DUR) 10 MEQ tablet Take 10 mEq by mouth daily.    . sacubitril-valsartan (ENTRESTO) 24-26 MG Take 1 tablet by mouth 2 (two) times daily. 180 tablet 3   No current facility-administered medications for this visit.     Allergies:   Amlodipine, Noroxin [norfloxacin], Cephalexin, Chocolate, Codeine, Sulfa antibiotics, Latex, and Penicillins   Social History:  The patient  reports that she has never smoked. She has never used smokeless tobacco. She reports that she does not drink alcohol or use drugs.   Family History:  The patient's family history includes Breast cancer in her cousin and cousin; Breast cancer (age of onset: 47) in her paternal aunt; Colon cancer (age of onset: 19) in her sister; Diabetes in her mother; Heart disease in her mother; Hypertension in her mother; Prostate cancer (age of onset: 58) in her father; Stroke in her father and mother.    ROS:  Please see the history of present illness.   Otherwise, review of  systems is positive for none.   All other systems are reviewed and negative.    PHYSICAL EXAM: VS:  BP 118/86   Pulse 71   Ht 5\' 4"  (1.626 m)   Wt 237 lb 9.6 oz (107.8 kg)   SpO2 96%   BMI 40.78 kg/m  , BMI Body mass index is 40.78 kg/m. GEN: Well nourished, well developed, in no acute distress  HEENT: normal  Neck: no JVD, carotid bruits, or masses Cardiac: RRR; no murmurs, rubs, or gallops,no edema  Respiratory:  clear to auscultation bilaterally, normal work of breathing GI: soft, nontender, nondistended, + BS MS: no deformity or atrophy  Skin: warm and dry,  device pocket is well healed Neuro:  Strength and sensation are intact Psych: euthymic mood, full affect  EKG:  EKG is ordered today. Personal review of the ekg ordered shows sinus rhythm, V paced  Device interrogation is reviewed today in detail.  See PaceArt for details.   Recent Labs: 04/15/2019: B Natriuretic Peptide 142.9 06/14/2019: ALT 21; BUN 7; Creatinine 0.91; Hemoglobin 12.1; Platelet Count 250; Potassium 4.3; Sodium 139    Lipid Panel     Component Value Date/Time   TRIG 110 04/15/2019 1042     Wt Readings from Last 3 Encounters:  07/17/19 237 lb 9.6 oz (107.8 kg)  06/14/19 240 lb 1.6 oz (108.9 kg)  04/16/19 239 lb 10.2 oz (108.7 kg)      Other studies Reviewed: Additional studies/ records that were reviewed today include: TTE 04/16/19  Review of the above records today demonstrates:   1. The left ventricle has normal systolic function, with an ejection fraction of 55-60%. The cavity size was normal. Left ventricular diastolic Doppler parameters are consistent with restrictive filling. Images poor, inadequate for regional wall motion  evaluation.  2. The right ventricle has normal systolic function. The cavity was mildly enlarged.  3. There is mild mitral annular calcification present. No evidence of mitral valve stenosis. No mitral regurgitation.  4. The aortic valve is tricuspid No stenosis of  the aortic valve.  5. The aortic root is normal in size and structure.  6. Left atrial size was not well visualized.  7. The interatrial septum was not well visualized.  8. The IVC was not visualized. No complete TR doppler jet so unable to estimate PA systolic pressure.  9. Technically difficult study.   ASSESSMENT AND PLAN:  1.  Second-degree AV block with intermittent complete heart block: Status post Medtronic dual-chamber pacemaker.  She is fortunately conducting a to V today.  We Marcel Sorter switch her to MVP and changed to chronic settings.  2.  History of nonischemic cardiomyopathy: Echo showed recovered ejection fraction.  Was due to chemotherapy induced cardiomyopathy.    Current medicines are reviewed at length with the patient today.   The patient does not have concerns regarding her medicines.  The following changes were made today:  none  Labs/ tests ordered today include:  Orders Placed This Encounter  Procedures  . EKG 12-Lead     Disposition:   FU with Aideliz Garmany 9 months  Signed, Chenoa Luddy Meredith Leeds, MD  07/17/2019 2:23 PM     [place] [address] [phone] (office) [phone] (fax)

## 2019-08-01 ENCOUNTER — Encounter: Payer: Self-pay | Admitting: Cardiology

## 2019-08-01 NOTE — Progress Notes (Signed)
Remote pacemaker transmission.   

## 2019-08-06 ENCOUNTER — Other Ambulatory Visit

## 2019-08-11 ENCOUNTER — Other Ambulatory Visit: Payer: Self-pay | Admitting: Nurse Practitioner

## 2019-09-20 ENCOUNTER — Other Ambulatory Visit: Payer: Self-pay | Admitting: Nurse Practitioner

## 2019-09-20 ENCOUNTER — Telehealth: Payer: Self-pay | Admitting: Cardiology

## 2019-09-20 MED ORDER — ANASTROZOLE 1 MG PO TABS: 1.0000 mg | ORAL_TABLET | Freq: Every day | ORAL | 3 refills | Status: DC

## 2019-09-20 NOTE — Telephone Encounter (Signed)
°  Pt c/o medication issue:  1. Name of Medication: sacubitril-valsartan (ENTRESTO) 24-26 MG  2. How are you currently taking this medication (dosage and times per day)? As directed   3. Are you having a reaction (difficulty breathing--STAT)? no  4. What is your medication issue? Pt feels like the medicine just isn't working.  Patient had a spell when her HR went low, and ended up having a pacemaker put in. She still did not feel like her energy was back, so the pacemaker doctor adjusted her pacemaker. That didn't seem to help her either.  She wakes up every morning and just feels fatigued and tired, like she has already done a full day of work.   She really would like to come in to see Dr. Radford Pax in person, especially because she had the pacemaker put in since she's seen Dr. Radford Pax last.

## 2019-09-20 NOTE — Telephone Encounter (Signed)
Pt called to report that she has been having extreme fatigue... she cannot do much after rising in the morning because she is so "washed out"... she is eating and drinking well.. no c/o dizziness, sob, chest pain.   Pt is wondering if her pacer is working properly..   She is unsure about her BP since she does not check it at home.   I advised her I will forward to the device clinic and after hearing back from them if not issues will make her an appt with Dr.Turner and I have also asked her to talk with her PCP. Pt agreed.

## 2019-09-20 NOTE — Telephone Encounter (Signed)
Spoke with patient, she will send transmission for review.  Chanetta Marshall, NP 09/20/2019 8:47 AM

## 2019-09-24 NOTE — Telephone Encounter (Signed)
Scheduled for automatic check on 09/25/19 via home monitor as manual transmission has not yet been received.

## 2019-09-25 NOTE — Telephone Encounter (Signed)
Transmission received 09-24-2019. I told her I will have the nurse review it and give her a call back.

## 2019-09-25 NOTE — Telephone Encounter (Signed)
Remote transmission received and reviewed. Device function WNL, no alerts and no episodes of AT/AF , VT or fast A & V rates. Patient reports continued felling of being "washed out" and tired in the morning. No CP, no SOB, no dizziness or syncope. Suggested that patient contact PCP concerning symptoms. She requested to be transferred to Dr Theodosia Blender staff . Will forward to triage for patient follow up.

## 2019-09-26 NOTE — Progress Notes (Signed)
Cardiology Office Note    Date:  09/27/2019   ID:  Meagan Ryan, DOB 04/24/1956, MRN [id number]  PCP:  Hulan Fess, MD  Cardiologist:  Dr. Radford Pax Electrophysiologist: Dr. Curt Bears  Chief Complaint: Fatigue  History of Present Illness:   Meagan Ryan is a 63 y.o. female non ischemic cardiomyopathy (impoved LV function) in the setting of chemotherapy (breast cancer in remission), hypertension, hyperlipidemia, mild OSA, stroke and CHB s/p dual chamber medtronic pacemaker 04/2019 seen for fatigue.   She has a history of dilated cardiomyopathy in the setting of previous chemotherapy for breast cancer with LVEF as low as 30 to 35%, however improved to 50 to 55% range by echocardiogram in February 2019.   Admitted with COVID 19 on  04/2019. Had CHB and underwent pacemaker placement. Echo shoed LVEF of 55-60%.   Recently called with "washed out". Device interrogation showed normal function. No episodes of AT/AF , VT or fast A & V rates.  Here today for further evaluation.  Patient reports not feeling well since COIVD dx in June.  Feels like no energy but no other symptoms.  No exercise due to chronic back pain, knee pain and morbid obesity.  She has dyspnea while walking up stairs or long distance.  She denies chest pain, palpitation, dizziness, orthopnea, PND, syncope, lower extremity edema or melena.  Past Medical History:  Diagnosis Date   Allergic rhinitis    Asthma    Breast cancer ([place]) 1996   Left breast-stage II infiltrating ductal carcinoma, 3/8 axillary lymph nodes were positive for metastatic carcinoma, getting yearly mammogram and every other year MRI followed by oncologist Dr Benay Spice   Complication of anesthesia    WAKE UP WITH HEADACHE, difficulty waking up   DCM (dilated cardiomyopathy) ([place])    EF normalized at 60-65% on echo 07/2016   Dysuria 2011   Family history of adverse reaction to anesthesia    "family wakes up with personality changes, but are very  short term"   Fatty liver CT 1/12   elevated LFT   Genetic testing 03/13/2018   Common cancers panel (47 genes) @ [facility] - No pathogenic mutations detected   H/O fibromyalgia    H/O osteopenia 2009   H/O peripheral neuropathy    H/O varicose veins    Hyperlipidemia    Hypertension    Incomplete RBBB    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  Intolerant to PAP therapy   Pelvic pain in female    PMB (postmenopausal bleeding) 2011   Pregnancy induced hypertension    Right-sided lacunar infarction [facility])    MRI 1.2012   Tubular adenoma of colon     Past Surgical History:  Procedure Laterality Date   Gastroc muscle repair  2007   LAPAROSCOPIC CHOLECYSTECTOMY     MASTECTOMY  06/1995   Left - for infiltrating ductal carcinoma   MASTECTOMY W/ SENTINEL NODE BIOPSY Right 03/31/2018   MASTECTOMY W/ SENTINEL NODE BIOPSY Right 03/31/2018   Procedure: RIGHT TOTAL MASTECTOMY WITH AXILLARY SENTINEL LYMPH NODE BIOPSY ERAS PATHWAY;  Surgeon: Excell Seltzer, MD;  Location: [facility];  Service: General;  Laterality: Right;   NEPHRECTOMY Right late 1990s   due to kidney shrinkage   PACEMAKER IMPLANT N/A 04/16/2019   Procedure: PACEMAKER IMPLANT;  Surgeon: Constance Haw, MD;  Location: [facility];  Service:  Cardiovascular;  Laterality: N/A;   ROBOTIC ASSISTED TOTAL HYSTERECTOMY WITH BILATERAL SALPINGO OOPHERECTOMY Bilateral 10/09/2015   Procedure: ROBOTIC ASSISTED BILATERAL SALPINGO OOPHORECTOMY ;  Surgeon: Everitt Amber, MD;  [facility];  Service: Gynecology;  Laterality: Bilateral;   Tram surgery  1996   TUBAL LIGATION      Current Medications: Prior to Admission medications   Medication Sig Start Date End Date Taking? Authorizing Provider  ALPRAZolam Duanne Moron) 0.5 MG tablet Take 0.5 mg by mouth 2 (two) times daily.     [provider]  anastrozole  (ARIMIDEX) 1 MG tablet Take 1 tablet (1 mg total) by mouth daily. 09/20/19   Alla Feeling, NP  azelastine (ASTELIN) 0.1 % nasal spray Place 1 spray into both nostrils 2 (two) times daily as needed for rhinitis or allergies.  07/21/15   [provider]  carvedilol (COREG) 3.125 MG tablet Take 1 tablet (3.125 mg total) by mouth 2 (two) times daily with a meal. 06/22/19   Turner, Eber Hong, MD  cetirizine (ZYRTEC) 10 MG tablet Take 10 mg by mouth every morning.     [provider]  doxycycline (VIBRA-TABS) 100 MG tablet Take 1 tablet (100 mg total) by mouth 2 (two) times daily. 1 po bid 04/28/19   Hassell Done, Mary-Margaret, FNP  DULoxetine (CYMBALTA) 30 MG capsule Take 30 mg by mouth at bedtime.     [provider]  fluticasone (FLONASE) 50 MCG/ACT nasal spray Place 1 spray into both nostrils every morning.  06/26/15   [provider]  gabapentin (NEURONTIN) 300 MG capsule Take 300 mg by mouth 2 (two) times daily. 02/14/16   [provider]  ivabradine (CORLANOR) 5 MG TABS tablet Take 1 tablet (5 mg total) by mouth 2 (two) times daily with a meal. 04/10/19   Turner, Eber Hong, MD  niacin 500 MG tablet Take 500 mg by mouth daily.    [provider]  nortriptyline (PAMELOR) 25 MG capsule Take 25 mg by mouth at bedtime as needed for sleep.     [provider]  Omega-3 Fatty Acids (FISH OIL) 1000 MG CAPS Take 2,000 mg by mouth 2 (two) times daily.    [provider]  omeprazole (PRILOSEC) 20 MG capsule Take 20 mg by mouth daily. 12/29/15   [provider]  potassium chloride (K-DUR) 10 MEQ tablet Take 10 mEq by mouth daily.    [provider]  sacubitril-valsartan (ENTRESTO) 24-26 MG Take 1 tablet by mouth 2 (two) times daily. 04/10/19   Sueanne Margarita, MD    Allergies:   Amlodipine, Noroxin [norfloxacin], Cephalexin, Chocolate, Codeine, Sulfa antibiotics, Latex, and Penicillins   Social History   Socioeconomic History    Marital status: Widowed    Spouse name: Not on file   Number of children: Not on file   Years of education: Not on file   Highest education level: Not on file  Occupational History   Not on file  Social Needs   Financial resource strain: Not on file   Food insecurity    Worry: Not on file    Inability: Not on file   Transportation needs    Medical: Not on file    Non-medical: Not on file  Tobacco Use   Smoking status: Never Smoker   Smokeless tobacco: Never Used  Substance and Sexual Activity   Alcohol use: No   Drug use: No   Sexual activity: Not on file  Lifestyle   Physical activity  Days per week: Not on file    Minutes per session: Not on file   Stress: Not on file  Relationships   Social connections    Talks on phone: Not on file    Gets together: Not on file    Attends religious service: Not on file    Active member of club or organization: Not on file    Attends meetings of clubs or organizations: Not on file    Relationship status: Not on file  Other Topics Concern   Not on file  Social History Narrative   Tobacco Use: Never smoked - no tobacco exposure   No alcohol   Caffeine: Yes   No recreational drug use   Exercise: Minimal   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 Wears Ear plugs   Marital Status: Widowed   1 daughter     Family History:  The patient's family history includes Breast cancer in her cousin and cousin; Breast cancer (age of onset: 44) in her paternal aunt; Colon cancer (age of onset: 44) in her sister; Diabetes in her mother; Heart disease in her mother; Hypertension in her mother; Prostate cancer (age of onset: 57) in her father; Stroke in her father and mother.   ROS:   Please see the history of present illness.    ROS All other systems reviewed and are negative.   PHYSICAL EXAM:   VS:  BP (!) 160/100    Pulse 77    Ht 5\' 4"  (1.626 m)    Wt 242 lb 1.9 oz (109.8 kg)    SpO2 97%    BMI  41.56 kg/m    GEN: obese female in no acute distress  HEENT: normal  Neck: no JVD, carotid bruits, or masses Cardiac: RRR; no murmurs, rubs, or gallops,no edema  Respiratory:  clear to auscultation bilaterally, normal work of breathing GI: soft, nontender, nondistended, + BS MS: no deformity or atrophy  Skin: warm and dry, no rash Neuro:  Alert and Oriented x 3, Strength and sensation are intact Psych: euthymic mood, full affect  Wt Readings from Last 3 Encounters:  09/27/19 242 lb 1.9 oz (109.8 kg)  07/17/19 237 lb 9.6 oz (107.8 kg)  06/14/19 240 lb 1.6 oz (108.9 kg)      Studies/Labs Reviewed:   EKG:  EKG is not ordered today.    Recent Labs: 04/15/2019: B Natriuretic Peptide 142.9 06/14/2019: ALT 21; BUN 7; Creatinine 0.91; Hemoglobin 12.1; Platelet Count 250; Potassium 4.3; Sodium 139   Lipid Panel    Component Value Date/Time   TRIG 110 04/15/2019 1042    Additional studies/ records that were reviewed today include:   04/16/2019: TTE IMPRESSIONS 1. The left ventricle has normal systolic function, with an ejection fraction of 55-60%. The cavity size was normal. Left ventricular diastolic Doppler parameters are consistent with restrictive filling. Images poor, inadequate for regional wall motion  evaluation. 2. The right ventricle has normal systolic function. The cavity was mildly enlarged. 3. There is mild mitral annular calcification present. No evidence of mitral valve stenosis. No mitral regurgitation. 4. The aortic valve is tricuspid No stenosis of the aortic valve. 5. The aortic root is normal in size and structure. 6. Left atrial size was not well visualized. 7. The interatrial septum was not well visualized. 8. The IVC was not visualized. No complete TR doppler jet so unable to estimate PA systolic pressure. 9. Technically difficult study.    ASSESSMENT & PLAN:  1. DOE/Fatigue She reports fatigue and lack of energy since diagnosis of  COVID-19  earlier this summer.  No exercise due to comorbidities.  Highly suspect her symptoms are due to deconditioning.  She does not have angina pain nor CHF symptoms.  Check lab work as below.  Highly recommended weight loss, dietary restriction and exercise.  2. Non ischemic cardiomyopathy in the setting of previous chemotherapy for breast cance - Last echo 04/2019 showed LVEF of 55-60% -Euvolemic -Continue beta-blocker, corlanor and entresto  3. CHB s/p pacemaker - normal device function 09/26/2019  4. HTN -Elevated. Increase Coreg to 6.25 mg twice daily. Continue Enstresto at current dose    Medication Adjustments/Labs and Tests Ordered: Current medicines are reviewed at length with the patient today.  Concerns regarding medicines are outlined above.  Medication changes, Labs and Tests ordered today are listed in the Patient Instructions below. Patient Instructions  Medication Instructions:  INCREASE: Carvedilol to 6.25 mg twice a day   *If you need a refill on your cardiac medications before your next appointment, please call your pharmacy*  Lab Work: TODAY: BMET. CBC & Pro BNP  If you have labs (blood work) drawn today and your tests are completely normal, you will receive your results only by:  MyChart Message (if you have MyChart) OR  A paper copy in the mail If you have any lab test that is abnormal or we need to change your treatment, we will call you to review the results.  Testing/Procedures: None   Follow-Up: You are scheduled to for a virtual visit with Dr. Radford Pax on 12/11/2019 @ 8:40 AM. Someone will call you 10-15 mins prior to your appointment to go over your medications and any vitals signs you are able to obtain  Other Instructions None      Signed, Leanor Kail, PA  09/27/2019 10:10 AM    [address], [place], [place]  [address] Phone: [phone]; Fax: [phone]

## 2019-09-27 ENCOUNTER — Ambulatory Visit (INDEPENDENT_AMBULATORY_CARE_PROVIDER_SITE_OTHER): Payer: Medicare Other | Admitting: Physician Assistant

## 2019-09-27 ENCOUNTER — Encounter: Payer: Self-pay | Admitting: Physician Assistant

## 2019-09-27 ENCOUNTER — Other Ambulatory Visit: Payer: Self-pay

## 2019-09-27 VITALS — BP 160/100 | HR 77 | Ht 64.0 in | Wt 242.1 lb

## 2019-09-27 DIAGNOSIS — R0609 Other forms of dyspnea: Secondary | ICD-10-CM

## 2019-09-27 DIAGNOSIS — Z7189 Other specified counseling: Secondary | ICD-10-CM | POA: Diagnosis not present

## 2019-09-27 DIAGNOSIS — I428 Other cardiomyopathies: Secondary | ICD-10-CM | POA: Diagnosis not present

## 2019-09-27 DIAGNOSIS — R5383 Other fatigue: Secondary | ICD-10-CM | POA: Diagnosis not present

## 2019-09-27 DIAGNOSIS — Z950 Presence of cardiac pacemaker: Secondary | ICD-10-CM

## 2019-09-27 DIAGNOSIS — R0600 Dyspnea, unspecified: Secondary | ICD-10-CM | POA: Diagnosis not present

## 2019-09-27 DIAGNOSIS — Z853 Personal history of malignant neoplasm of breast: Secondary | ICD-10-CM

## 2019-09-27 DIAGNOSIS — I441 Atrioventricular block, second degree: Secondary | ICD-10-CM

## 2019-09-27 LAB — CBC
Hematocrit: 42.1 % (ref 34.0–46.6)
Hemoglobin: 13.8 g/dL (ref 11.1–15.9)
MCH: 26.6 pg (ref 26.6–33.0)
MCHC: 32.8 g/dL (ref 31.5–35.7)
MCV: 81 fL (ref 79–97)
Platelets: 287 10*3/uL (ref 150–450)
RBC: 5.19 x10E6/uL (ref 3.77–5.28)
RDW: 14.8 % (ref 11.7–15.4)
WBC: 4.9 10*3/uL (ref 3.4–10.8)

## 2019-09-27 LAB — BASIC METABOLIC PANEL
BUN/Creatinine Ratio: 10 — ABNORMAL LOW (ref 12–28)
BUN: 10 mg/dL (ref 8–27)
CO2: 22 mmol/L (ref 20–29)
Calcium: 10 mg/dL (ref 8.7–10.3)
Chloride: 99 mmol/L (ref 96–106)
Creatinine, Ser: 0.96 mg/dL (ref 0.57–1.00)
GFR calc Af Amer: 72 mL/min/{1.73_m2} (ref >59)
GFR calc non Af Amer: 62 mL/min/{1.73_m2} (ref >59)
Glucose: 90 mg/dL (ref 65–99)
Potassium: 4.6 mmol/L (ref 3.5–5.2)
Sodium: 136 mmol/L (ref 134–144)

## 2019-09-27 LAB — PRO B NATRIURETIC PEPTIDE: NT-Pro BNP: 89 pg/mL (ref 0–301)

## 2019-09-27 MED ORDER — CARVEDILOL 6.25 MG PO TABS: 6.2500 mg | ORAL_TABLET | Freq: Two times a day (BID) | ORAL | 3 refills | Status: DC

## 2019-09-27 NOTE — Patient Instructions (Signed)
Medication Instructions:  INCREASE: Carvedilol to 6.25 mg twice a day   *If you need a refill on your cardiac medications before your next appointment, please call your pharmacy*  Lab Work: TODAY: BMET. CBC & Pro BNP  If you have labs (blood work) drawn today and your tests are completely normal, you will receive your results only by: Marland Kitchen MyChart Message (if you have MyChart) OR . A paper copy in the mail If you have any lab test that is abnormal or we need to change your treatment, we will call you to review the results.  Testing/Procedures: None   Follow-Up: You are scheduled to for a virtual visit with Dr. Radford Pax on 12/11/2019 @ 8:40 AM. Someone will call you 10-15 mins prior to your appointment to go over your medications and any vitals signs you are able to obtain  Other Instructions None

## 2019-10-15 ENCOUNTER — Encounter

## 2019-10-16 ENCOUNTER — Ambulatory Visit (INDEPENDENT_AMBULATORY_CARE_PROVIDER_SITE_OTHER): Payer: Medicare Other | Admitting: *Deleted

## 2019-10-16 DIAGNOSIS — I441 Atrioventricular block, second degree: Secondary | ICD-10-CM

## 2019-10-17 LAB — CUP PACEART REMOTE DEVICE CHECK
Date Time Interrogation Session: 20201208071051
Implantable Lead Implant Date: 20200608
Implantable Lead Implant Date: 20200608
Implantable Lead Location: 753859
Implantable Lead Location: 753860
Implantable Lead Model: 5076
Implantable Lead Model: 5076
Implantable Pulse Generator Implant Date: 20200608

## 2019-10-22 ENCOUNTER — Other Ambulatory Visit

## 2019-12-09 ENCOUNTER — Encounter: Payer: Self-pay | Admitting: Cardiology

## 2019-12-09 NOTE — Progress Notes (Signed)
Virtual Visit via Telephone Note   This visit type was conducted due to national recommendations for restrictions regarding the COVID-19 Pandemic (e.g. social distancing) in an effort to limit this patient's exposure and mitigate transmission in our community.  Due to her co-morbid illnesses, this patient is at least at moderate risk for complications without adequate follow up.  This format is felt to be most appropriate for this patient at this time.  All issues noted in this document were discussed and addressed.  A limited physical exam was performed with this format.  Please refer to the patient's chart for her consent to telehealth for [facility].   Evaluation Performed:  Follow-up visit  This visit type was conducted due to national recommendations for restrictions regarding the COVID-19 Pandemic (e.g. social distancing).  This format is felt to be most appropriate for this patient at this time.  All issues noted in this document were discussed and addressed.  No physical exam was performed (except for noted visual exam findings with Video Visits).  Please refer to the patient's chart (MyChart message for video visits and phone note for telephone visits) for the patient's consent to telehealth for [facility].  Date:  12/11/2019   ID:  Meagan Ryan, DOB Jul 01, 1956, MRN [id number]  Patient Location:  Home  Provider location:   [place]  PCP:  Hulan Fess, MD  Cardiologist:  Fransico Him, MD Electrophysiologist:  Constance Haw, MD   Chief Complaint:  DCM, HTN, CHF  History of Present Illness:    Meagan Ryan is a 64 y.o. female who presents via audio/video conferencing for a telehealth visit today.    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  She has h/o breast cancer with recurrence. Her cardiomyopathy wasdiagnosed in October 2016. Echo at that time showed reduced EF down to 30 to 35%. Subsequently,she  underwent further ischemic evaluation with a coronary CTA with morphology done on September 04, 2015. This showed a coronary calcium score of 0. Normal coronary origin with right dominance. There was no evidence of CAD, making the diagnosis of nonischemic cardiomyopathy. She was placed on medical therapy. She has been on a BB and Entresto along with Corlanor. Shehad a repeat echocardiogram December 14, 2017 which showed improvement in EF to 50 to 55%. Grade 2 diastolic dysfunction was also noted. Her valvularfunction was normal.   She was admitted 04/2019 with COVID 19 complicated by symptomatic bradycardia with second degree Type 2 HB and intermittent CHB.  Carvedilol was stopped.  She underwent PPM by Dr. Curt Bears.  She was seen back 11/202 and was doing well except for fatigue and lack of energy since COVID.  It was felt sx were due to deconditioning and residual effects from COVID.  Her Carvedilol was restarted at 6.25mg  BID due to HTN.   She is here today for followup.  She continues to have fatigue.  Dr. Curt Bears adjusted her PPM settings but this did not help.  She thinks the ongoing fatigue is due to COVID.  She has a lingering cough at night still.   She continues to have DOE since her COVID infection even with doing housework.  She denies any chest pain or pressure,  PND, orthopnea, LE edema, dizziness, palpitations or syncope. She is compliant with her meds and is tolerating meds with no SE.    The patient does not have symptoms concerning for COVID-19 infection (fever, chills, cough, or new shortness  of breath).    Prior CV studies:   The following studies were reviewed today:  2D echo  Past Medical History:  Diagnosis Date  . Allergic rhinitis   . Asthma   . Breast cancer ([place]) 1996   Left breast-stage II infiltrating ductal carcinoma, 3/8 axillary lymph nodes were positive for metastatic carcinoma, getting yearly mammogram and every other year MRI followed by oncologist Dr  Benay Spice  . Complication of anesthesia    WAKE UP WITH HEADACHE, difficulty waking up  . DCM (dilated cardiomyopathy) ([place])    EF 55-60% by echo 04/2019  . Dysuria 2011  . Family history of adverse reaction to anesthesia    "family wakes up with personality changes, but are very short term"  . Fatty liver CT 1/12   elevated LFT  . Genetic testing 03/13/2018   Common cancers panel (47 genes) @ [facility] - No pathogenic mutations detected  . H/O fibromyalgia   . H/O osteopenia 2009  . H/O peripheral neuropathy   . H/O varicose veins   . Hyperlipidemia   . Hypertension   . Incomplete RBBB   . 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 OSA (obstructive sleep apnea) 12/31/2015   Very mild with AHI 6/hr but elevated RDI at 32/hr.  Intolerant to PAP therapy  . Pelvic pain in female   . PMB (postmenopausal bleeding) 2011  . Pregnancy induced hypertension   . Right-sided lacunar infarction ([place])    MRI 1.2012  . Tubular adenoma of colon    Past Surgical History:  Procedure Laterality Date  . Gastroc muscle repair  2007  . LAPAROSCOPIC CHOLECYSTECTOMY    . MASTECTOMY  06/1995   Left - for infiltrating ductal carcinoma  . MASTECTOMY W/ SENTINEL NODE BIOPSY Right 03/31/2018  . MASTECTOMY W/ SENTINEL NODE BIOPSY Right 03/31/2018   Procedure: RIGHT TOTAL MASTECTOMY WITH AXILLARY SENTINEL LYMPH NODE BIOPSY ERAS PATHWAY;  Surgeon: Excell Seltzer, MD;  Location: [place];  Service: General;  Laterality: Right;  . 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 PACEMAKER IMPLANT N/A 04/16/2019   Procedure: PACEMAKER IMPLANT;  Surgeon: Constance Haw, MD;  Location: [place] [facility];  Service: Cardiovascular;  Laterality: N/A;  . ROBOTIC ASSISTED TOTAL HYSTERECTOMY WITH BILATERAL SALPINGO OOPHERECTOMY Bilateral 10/09/2015   Procedure: ROBOTIC ASSISTED BILATERAL SALPINGO OOPHORECTOMY ;  Surgeon: Everitt Amber, MD;  Location: [facility];  Service: Gynecology;  Laterality: Bilateral;  . Tram  surgery  1996  . TUBAL LIGATION       Current Meds  Medication Sig  . ALPRAZolam (XANAX) 0.5 MG tablet Take 0.5 mg by mouth 2 (two) times daily.   Marland Kitchen anastrozole (ARIMIDEX) 1 MG tablet Take 1 tablet (1 mg total) by mouth daily.  . carvedilol (COREG) 6.25 MG tablet Take 1 tablet (6.25 mg total) by mouth 2 (two) times daily with a meal.  . cetirizine (ZYRTEC) 10 MG tablet Take 10 mg by mouth every morning.   . DULoxetine (CYMBALTA) 30 MG capsule Take 30 mg by mouth at bedtime.   . fluticasone (FLONASE) 50 MCG/ACT nasal spray Place 1 spray into both nostrils every morning.   . gabapentin (NEURONTIN) 300 MG capsule Take 300 mg by mouth 2 (two) times daily.  . ivabradine (CORLANOR) 5 MG TABS tablet Take 1 tablet (5 mg total) by mouth 2 (two) times daily with a meal.  . niacin 500 MG tablet Take 500 mg by mouth daily.  Marland Kitchen  Omega-3 Fatty Acids (FISH OIL) 1000 MG CAPS Take 2,000 mg by mouth 2 (two) times daily.  Marland Kitchen omeprazole (PRILOSEC) 20 MG capsule Take 20 mg by mouth daily.  . potassium chloride (K-DUR) 10 MEQ tablet Take 10 mEq by mouth daily.  . sacubitril-valsartan (ENTRESTO) 24-26 MG Take 1 tablet by mouth 2 (two) times daily.     Allergies:   Amlodipine, Noroxin [norfloxacin], Cephalexin, Chocolate, Codeine, Sulfa antibiotics, Latex, and Penicillins   Social History   Tobacco Use  . Smoking status: Never Smoker  . Smokeless tobacco: Never Used  Substance Use Topics  . Alcohol use: No  . Drug use: No     Family Hx: The patient's family history includes Breast cancer in her cousin and cousin; Breast cancer (age of onset: 59) in her paternal aunt; Colon cancer (age of onset: 31) in her sister; Diabetes in her mother; Heart disease in her mother; Hypertension in her mother; Prostate cancer (age of onset: 9) in her father; Stroke in her father and mother.  ROS:   Please see the history of present illness.     All other systems reviewed and are negative.   Labs/Other Tests and Data  Reviewed:    Recent Labs: 04/15/2019: B Natriuretic Peptide 142.9 06/14/2019: ALT 21 09/27/2019: BUN 10; Creatinine, Ser 0.96; Hemoglobin 13.8; NT-Pro BNP 89; Platelets 287; Potassium 4.6; Sodium 136   Recent Lipid Panel Lab Results  Component Value Date/Time   TRIG 110 04/15/2019 10:42 AM    Wt Readings from Last 3 Encounters:  12/11/19 242 lb (109.8 kg)  09/27/19 242 lb 1.9 oz (109.8 kg)  07/17/19 237 lb 9.6 oz (107.8 kg)     Objective:    Vital Signs:  Ht 5\' 4"  (1.626 m)   Wt 242 lb (109.8 kg)   BMI 41.54 kg/m     ASSESSMENT & PLAN:    1.  DCM -felt secondary to chemo -initial EF 30-35% but normalized to 55-60% on echo 04/2019 -She is NYHA class 2b -continue Corlanor 5mg  BID, Entresto 24-26mg  BID and Carvedilol 6.25mg  BID -Creatinine 0.96 09/2019  2  HTN -BP controlled -continue Carvedilol 6.25mg  BID, Entresto 24-26mg  BID -creatinine 0.96 09/2019  3.  Chronic diastolic CHF -she denies any  LE edema -she has not required any diuretics -continue BB  4.  Breast CA -she has a history of left breast-stage II infiltrating ductal carcinoma, 3/8 axillary lymph nodes were positive for metastatic carcinoma in 1996 with left mastectomy.  -Then developed  Stage 1A invasive ductal CA s/p right mastectomy -followed by Oncology  5.  DOE -this started after her COVID infection in June -she gets DOE just with daily ADLs and housework -suspect she has some lung damage from the [place] -she is morbidly obese but is so tired she cannot exercise -pacer was adjusted but did not improve her SOB -repeat 2D echo to make sure LVF has remained normal -refer to Pulmonary for evaluation  6.  Morbid Obesity -unfortunately she is so fatigued she cannot exercise -encouraged her to cut back on Carbs and sugar  7.  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 Curt Bears   COVID-19 Education: The signs and symptoms of COVID-19 were discussed with the  patient and how to seek care for testing (follow up with PCP or arrange E-visit).  The importance of social distancing was discussed today.  Patient Risk:   After full review of this patient's clinical status, I feel  that they are at least moderate risk at this time.  Time:   Today, I have spent 20 minutes directly with the patient on telemedicine discussing medical problems including [place], chronic diastolic CHF, 0000000.  We also reviewed the symptoms of COVID 19 and the ways to protect against contracting the virus with telehealth technology.  I spent an additional 5 minutes reviewing patient's chart including labs and 2D echo.  Medication Adjustments/Labs and Tests Ordered: Current medicines are reviewed at length with the patient today.  Concerns regarding medicines are outlined above.  Tests Ordered: No orders of the defined types were placed in this encounter.  Medication Changes: No orders of the defined types were placed in this encounter.   Disposition:  Follow up in 1 year(s)  Signed, Fransico Him, MD  12/11/2019 8:46 AM    [facility]

## 2019-12-10 NOTE — Progress Notes (Signed)
[place]   [phone] [phone]   Clinic Follow up Note   Patient Care Team: Hulan Fess, MD as PCP - General (Family Medicine) Constance Haw, MD as PCP - Electrophysiology (Cardiology) Sueanne Margarita, MD as Consulting Physician (Cardiology) Gery Pray, MD as Consulting Physician (Radiation Oncology) Excell Seltzer, MD (Inactive) as Consulting Physician (General Surgery) Truitt Merle, MD as Consulting Physician (Hematology) Delice Bison Charlestine Massed, NP as Nurse Practitioner (Hematology and Oncology)  I connected with Meagan Ryan on 12/14/2019 at  1:20 PM EST by telephone visit and verified that I am speaking with the correct person using two identifiers.  I discussed the limitations, risks, security and privacy concerns of performing an evaluation and management service by telephone and the availability of in person appointments. I also discussed with the patient that there may be a patient responsible charge related to this service. The patient expressed understanding and agreed to proceed.   Patient's location:  Her home  Provider's location:  My Office   CHIEF COMPLAINT: F/u of right breast cancer  SUMMARY OF ONCOLOGIC HISTORY: Oncology History Overview Note  Cancer Staging Malignant neoplasm of upper-outer quadrant of right breast in female, estrogen receptor positive ([place]) Staging form: Breast, AJCC 8th Edition - Clinical stage from 02/21/2018: Stage IA (cT1b, cN0, cM0, G1, ER+, PR+, HER2-) - Signed by Truitt Merle, MD on 03/01/2018     Malignant neoplasm of upper-outer quadrant of right breast in female, estrogen receptor positive ([place])  12/25/2015 Imaging   12/25/2015 Digital Screen Unilat R IMPRESSION: Further evaluation is suggested for possible distortion in the right breast.   01/02/2016 Imaging   01/02/2016 MM DIAG Breast TOMO Uni Right IMPRESSION: Further evaluation is suggested for possible distortion in the  right breast.   01/12/2017 Imaging   01/12/2017 MM Digital Screening Unilat R IMPRESSION: No mammographic evidence of malignancy. A result letter of this screening mammogram will be mailed directly to the patient.   02/07/2018 Imaging   02/07/2018 MM 3D Screen Breast UNI Right IMPRESSION: Further evaluation is suggested for possible mass in the right breast.    02/16/2018 Mammogram   IMPRESSION: Irregular hypoechoic mass in the RIGHT breast at the 12 o'clock axis, retroareolar, measuring 7 mm, corresponding to the new mass seen on mammogram. This is a suspicious finding for which ultrasound-guided biopsy is recommended.   02/21/2018 Initial Biopsy   Diagnosis 02/21/18 Breast, right, needle core biopsy, 12 o'clock - INVASIVE DUCTAL CARCINOMA. - DUCTAL CARCINOMA IN SITU. - SEE COMMENT.   02/21/2018 Receptors her2   Prognostic indicators significant for: ER, 100% positive and PR, 100% positive or negative, both with strong staining intensity. Proliferation marker Ki67 at 5%. HER2 negative.   02/21/2018 Cancer Staging   Staging form: Breast, AJCC 8th Edition - Clinical stage from 02/21/2018: Stage IA (cT1b, cN0, cM0, G1, ER+, PR+, HER2-) - Signed by Truitt Merle, MD on 03/01/2018   02/21/2018 Imaging   02/21/2018 [place] RT Breast BX W LOC DEV 1st Lesion IMG BX SPEC US Guide IMPRESSION: Ultrasound-guided biopsy of the RIGHT breast mass at the 12 o'clock axis. No apparent complications   9/67/8938 Initial Diagnosis   Malignant neoplasm of upper-outer quadrant of right breast in female, estrogen receptor positive ([place])   03/31/2018 Surgery   03/31/2018 Right total mastectomy 64 year old female with a new diagnosis of cancer of the right breast, upper outer quadrant. Clinical stage one a, ER positive, PR positive, HER-2 negative.  She has a remote  history of left breast cancer treated with modified mastectomy and TRAM flap reconstruction in the 1990s.  After discussion of treatment options and  risks extensively detailed elsewhere she has elected to proceed with right total mastectomy and axillary sentinel lymph node biopsy as initial surgical therapy.   03/31/2018 Pathology Results   03/31/2018 Diagnosis 1. Breast, simple mastectomy, Right Total - INVASIVE AND IN SITU DUCTAL CARCINOMA, 0.6 CM. - MARGINS NOT INVOLVED. - FIBROCYSTIC CHANGES. - BIOPSY SITE AND BIOPSY CLIP. 2. Lymph node, sentinel, biopsy, Right Axillary #1 - ONE BENIGN LYMPH NODE (0/1). 3. Lymph node, sentinel, biopsy, Right Axillary #2 - ONE BENIGN LYMPH NODE (0/1).    05/01/2018 -  Anti-estrogen oral therapy   Discontinued Evista. Anastrozole 1 mg daily       CURRENT THERAPY:  Anastrozole 63m daily, started 04/2018  INTERVAL HISTORY:  Meagan BOWNis here for a follow up of right breast cancer. She was last seen by me 6 months ago. She was able to identify herself by birth date. She notes she still has weakness and SOB along with cough since she had COVID19 last year. Her cardiologist ordered ECHO and referred her to pulmonologist for more work up. She sees her PCP every 6 months and last saw him 2 weeks ago. She has been getting her shingles shot with PCP.  She notes no abdominal bloating or discomfort. She notes her appetite is adequate and no urinary or BM issues. She notes she is overdue for DEXA and plans to have it with her PCP. She is taking calcium and Vit D. I reviewed her medication list with her. She is on Cymbalta and Gabapentin for neuropathy.    REVIEW OF SYSTEMS:   Constitutional: Denies fevers, chills or abnormal weight loss (+) Fatigue/weakness Eyes: Denies blurriness of vision Ears, nose, mouth, throat, and face: Denies mucositis or sore throat Respiratory: Denies wheezes (+) Lingering cough and SOB  Cardiovascular: Denies palpitation, chest discomfort or lower extremity swelling Gastrointestinal:  Denies nausea, heartburn or change in bowel habits Skin: Denies abnormal skin  rashes Lymphatics: Denies new lymphadenopathy or easy bruising Neurological:Denies numbness, tingling or new weaknesses Behavioral/Psych: Mood is stable, no new changes  All other systems were reviewed with the patient and are negative.  MEDICAL HISTORY:  Past Medical History:  Diagnosis Date  . Allergic rhinitis   . Asthma   . Breast cancer ([facility] 1996   Left breast-stage II infiltrating ductal carcinoma, 3/8 axillary lymph nodes were positive for metastatic carcinoma, getting yearly mammogram and every other year MRI followed by oncologist Dr SBenay Spice . Complication of anesthesia    WAKE UP WITH HEADACHE, difficulty waking up  . DCM (dilated cardiomyopathy) ([facility]    EF 55-60% by echo 04/2019  . Dysuria 2011  . Family history of adverse reaction to anesthesia    "family wakes up with personality changes, but are very short term"  . Fatty liver CT 1/12   elevated LFT  . Genetic testing 03/13/2018   Common cancers panel (47 genes) @ [facility] - No pathogenic mutations detected  . H/O fibromyalgia   . H/O osteopenia 2009  . H/O peripheral neuropathy   . H/O varicose veins   . Hyperlipidemia   . Hypertension   . Incomplete RBBB   . 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 OSA (obstructive sleep apnea) 12/31/2015   Very mild with AHI 6/hr but elevated RDI at 32/hr.  Intolerant to PAP therapy  .  Pelvic pain in female   . PMB (postmenopausal bleeding) 2011  . Pregnancy induced hypertension   . Right-sided lacunar infarction ([place])    MRI 1.2012  . Tubular adenoma of colon     SURGICAL HISTORY: Past Surgical History:  Procedure Laterality Date  . Gastroc muscle repair  2007  . LAPAROSCOPIC CHOLECYSTECTOMY    . MASTECTOMY  06/1995   Left - for infiltrating ductal carcinoma  . MASTECTOMY W/ SENTINEL NODE BIOPSY Right 03/31/2018  . MASTECTOMY W/ SENTINEL NODE BIOPSY Right 03/31/2018   Procedure: RIGHT TOTAL MASTECTOMY WITH AXILLARY SENTINEL LYMPH NODE BIOPSY ERAS PATHWAY;  Surgeon:  Excell Seltzer, MD;  Location: [facility];  Service: General;  Laterality: Right;  . 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 PACEMAKER IMPLANT N/A 04/16/2019   Procedure: PACEMAKER IMPLANT;  Surgeon: Constance Haw, MD;  Location: [facility];  Service: Cardiovascular;  Laterality: N/A;  . ROBOTIC ASSISTED TOTAL HYSTERECTOMY WITH BILATERAL SALPINGO OOPHERECTOMY Bilateral 10/09/2015   Procedure: ROBOTIC ASSISTED BILATERAL SALPINGO OOPHORECTOMY ;  Surgeon: Everitt Amber, MD;  Location: [facility];  Service: Gynecology;  Laterality: Bilateral;  . Tram surgery  1996  . TUBAL LIGATION      I have reviewed the social history and family history with the patient and they are unchanged from previous note.  ALLERGIES:  is allergic to amlodipine; noroxin [norfloxacin]; cephalexin; chocolate; codeine; sulfa antibiotics; latex; and penicillins.  MEDICATIONS:  Current Outpatient Medications  Medication Sig Dispense Refill  . ALPRAZolam (XANAX) 0.5 MG tablet Take 0.5 mg by mouth 2 (two) times daily.     Marland Kitchen anastrozole (ARIMIDEX) 1 MG tablet Take 1 tablet (1 mg total) by mouth daily. 90 tablet 3  . azelastine (ASTELIN) 0.1 % nasal spray Place 1 spray into both nostrils 2 (two) times daily as needed for rhinitis or allergies.     . carvedilol (COREG) 6.25 MG tablet Take 1 tablet (6.25 mg total) by mouth 2 (two) times daily with a meal. 180 tablet 3  . cetirizine (ZYRTEC) 10 MG tablet Take 10 mg by mouth every morning.     Marland Kitchen doxycycline (VIBRA-TABS) 100 MG tablet Take 1 tablet (100 mg total) by mouth 2 (two) times daily. 1 po bid (Patient not taking: Reported on 12/11/2019) 20 tablet 0  . DULoxetine (CYMBALTA) 30 MG capsule Take 30 mg by mouth at bedtime.     . fluticasone (FLONASE) 50 MCG/ACT nasal spray Place 1 spray into both nostrils every morning.     . gabapentin (NEURONTIN) 300 MG capsule Take 300 mg by mouth 2 (two) times daily.    . ivabradine (CORLANOR) 5 MG TABS tablet Take 1  tablet (5 mg total) by mouth 2 (two) times daily with a meal. 180 tablet 3  . niacin 500 MG tablet Take 500 mg by mouth daily.    . nortriptyline (PAMELOR) 25 MG capsule Take 25 mg by mouth at bedtime as needed for sleep.     . Omega-3 Fatty Acids (FISH OIL) 1000 MG CAPS Take 2,000 mg by mouth 2 (two) times daily.    Marland Kitchen omeprazole (PRILOSEC) 20 MG capsule Take 20 mg by mouth daily.    . potassium chloride (K-DUR) 10 MEQ tablet Take 10 mEq by mouth daily.    . sacubitril-valsartan (ENTRESTO) 24-26 MG Take 1 tablet by mouth 2 (two) times daily. 180 tablet 3   No current facility-administered medications for this visit.    PHYSICAL EXAMINATION: ECOG PERFORMANCE  STATUS: 1 - Symptomatic but completely ambulatory  No vitals taken today, Exam not performed today  LABORATORY DATA:  I have reviewed the data as listed CBC Latest Ref Rng & Units 09/27/2019 06/14/2019 04/17/2019  WBC 3.4 - 10.8 x10E3/uL 4.9 5.4 3.5(L)  Hemoglobin 11.1 - 15.9 g/dL 13.8 12.1 13.1  Hematocrit 34.0 - 46.6 % 42.1 38.1 39.4  Platelets 150 - 450 x10E3/uL 287 250 167     CMP Latest Ref Rng & Units 09/27/2019 06/14/2019 04/17/2019  Glucose 65 - 99 mg/dL 90 116(H) 93  BUN 8 - 27 mg/dL 10 7(L) 6(L)  Creatinine 0.57 - 1.00 mg/dL 0.96 0.91 0.90  Sodium 134 - 144 mmol/L 136 139 134(L)  Potassium 3.5 - 5.2 mmol/L 4.6 4.3 3.5  Chloride 96 - 106 mmol/L 99 103 100  CO2 20 - 29 mmol/L 22 26 23   Calcium 8.7 - 10.3 mg/dL 10.0 9.6 8.9  Total Protein 6.5 - 8.1 g/dL - 7.6 6.7  Total Bilirubin 0.3 - 1.2 mg/dL - 0.6 0.9  Alkaline Phos 38 - 126 U/L - 99 67  AST 15 - 41 U/L - 26 32  ALT 0 - 44 U/L - 21 19      RADIOGRAPHIC STUDIES: I have personally reviewed the radiological images as listed and agreed with the findings in the report. No results found.   ASSESSMENT & PLAN:  Meagan Ryan is a 64 y.o. female with    1. Malignant neoplasm of upper-outer quadrant of right breast, Invasive Ductal Carcinoma and DCIS, pT1bN0M0 stage  IA, grade I, ER/PRpositive, HER2 negative -Diagnosed in 12/2015. Treated withrightmastectomy. Currently on anastrozole 1 mg daily, started in 04/2018. Tolerating well, no exacerbation of her manageable arthritis.  -She has attended survivorship clinicpreviously -She is clinically doing well. Her 09/2019 CBC and BMP were normal. She did not note any breast concerns. Will continue Surveillance. She had b/l mastectomies, no need for mammograms.  -She is overdue for DEXA, will proceed in 2021 with her PCP  -Continue anastrozole  -F/u in 10 months. She will f/u with her PCP in interim.   2. Previous left breast cancer in 1996, s/p left mastectomy, chemo, reconstruction surgery and Evista -No evidence of recurrence. Currently on Anastrozole for right breast cancer.  3. Obesitywith weight gain, OSA and dilated cardiomyopathy  -f/u with PCP and cardiologist Dr. Radford Pax. -She is gaining weight and upset about it.  -Her LL neuropathy and Menire disease limit her ability to exercise. -Her weight is currently maintained, but she struggles with losing weight.  -I previously discussed diet and exercise with her at length. I previously offered her the chance to attend [facility] weight management clinic. She is declined for now.   4. COVID-19 infection in May 2020 -She tested positive in late 03/2019. Her whole family had COVID and they all survived.  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 She had pacemaker placed on 04/16/19. She has since recovered.  -She continues to have residual fatigue, cough and SOB since her COVID infection. She will continue to f/u with her PCP, cardiologist and will be seen by pulmonology soon.    Plan -She is clinically stable.  -Continue Anastrozole  -DEXA with Dr. Kyra Manges 2021 -Lab and f/u in 10 months    No problem-specific Assessment & Plan notes found for this encounter.   No orders of the defined types were placed  in this encounter.  I discussed the assessment and treatment plan with  the patient. The patient was provided an opportunity to ask questions and all were answered. The patient agreed with the plan and demonstrated an understanding of the instructions.  The patient was advised to call back or seek an in-person evaluation if the symptoms worsen or if the condition fails to improve as anticipated.  The total time spent in the appointment was 15 minutes.     Truitt Merle, MD 12/14/2019   I, Joslyn Devon, am acting as scribe for Truitt Merle, MD.   I have reviewed the above documentation for accuracy and completeness, and I agree with the above.

## 2019-12-11 ENCOUNTER — Encounter: Payer: Self-pay | Admitting: Cardiology

## 2019-12-11 ENCOUNTER — Other Ambulatory Visit: Payer: Self-pay

## 2019-12-11 ENCOUNTER — Telehealth (INDEPENDENT_AMBULATORY_CARE_PROVIDER_SITE_OTHER): Payer: Medicare Other | Admitting: Cardiology

## 2019-12-11 VITALS — Ht 64.0 in | Wt 242.0 lb

## 2019-12-11 DIAGNOSIS — R0600 Dyspnea, unspecified: Secondary | ICD-10-CM | POA: Diagnosis not present

## 2019-12-11 DIAGNOSIS — I441 Atrioventricular block, second degree: Secondary | ICD-10-CM

## 2019-12-11 DIAGNOSIS — Z8616 Personal history of COVID-19: Secondary | ICD-10-CM

## 2019-12-11 DIAGNOSIS — I5032 Chronic diastolic (congestive) heart failure: Secondary | ICD-10-CM

## 2019-12-11 DIAGNOSIS — I110 Hypertensive heart disease with heart failure: Secondary | ICD-10-CM

## 2019-12-11 DIAGNOSIS — Z803 Family history of malignant neoplasm of breast: Secondary | ICD-10-CM

## 2019-12-11 DIAGNOSIS — I509 Heart failure, unspecified: Secondary | ICD-10-CM

## 2019-12-11 DIAGNOSIS — I420 Dilated cardiomyopathy: Secondary | ICD-10-CM | POA: Diagnosis not present

## 2019-12-11 DIAGNOSIS — R0609 Other forms of dyspnea: Secondary | ICD-10-CM

## 2019-12-11 DIAGNOSIS — I10 Essential (primary) hypertension: Secondary | ICD-10-CM

## 2019-12-11 DIAGNOSIS — Z7189 Other specified counseling: Secondary | ICD-10-CM

## 2019-12-11 DIAGNOSIS — Z853 Personal history of malignant neoplasm of breast: Secondary | ICD-10-CM

## 2019-12-11 DIAGNOSIS — I442 Atrioventricular block, complete: Secondary | ICD-10-CM

## 2019-12-11 DIAGNOSIS — Z6841 Body Mass Index (BMI) 40.0 and over, adult: Secondary | ICD-10-CM

## 2019-12-11 NOTE — Patient Instructions (Signed)
Medication Instructions:  Your physician recommends that you continue on your current medications as directed. Please refer to the Current Medication list given to you today.  *If you need a refill on your cardiac medications before your next appointment, please call your pharmacy*  Testing/Procedures: Your physician has requested that you have an echocardiogram. Echocardiography is a painless test that uses sound waves to create images of your heart. It provides your doctor with information about the size and shape of your heart and how well your heart's chambers and valves are working. This procedure takes approximately one hour. There are no restrictions for this procedure.  Follow-Up: At [facility], you and your health needs are our priority.  As part of our continuing mission to provide you with exceptional heart care, we have created designated Provider Care Teams.  These Care Teams include your primary Cardiologist (physician) and Advanced Practice Providers (APPs -  Physician Assistants and Nurse Practitioners) who all work together to provide you with the care you need, when you need it.  Your next appointment:   6 month(s)  The format for your next appointment:   In Person  Provider:   You may see Fransico Him, MD or one of the following Advanced Practice Providers on your designated Care Team:    Melina Copa, PA-C  Ermalinda Barrios, PA-C   Other Instructions You have been referred to Pulmonology.

## 2019-12-14 ENCOUNTER — Inpatient Hospital Stay: Payer: Medicare Other | Attending: Hematology | Admitting: Hematology

## 2019-12-14 ENCOUNTER — Other Ambulatory Visit

## 2019-12-14 ENCOUNTER — Encounter: Payer: Self-pay | Admitting: Hematology

## 2019-12-14 DIAGNOSIS — I10 Essential (primary) hypertension: Secondary | ICD-10-CM | POA: Diagnosis not present

## 2019-12-14 DIAGNOSIS — Z170 Estrogen receptor positive status [ER+]: Secondary | ICD-10-CM

## 2019-12-14 DIAGNOSIS — C50411 Malignant neoplasm of upper-outer quadrant of right female breast: Secondary | ICD-10-CM

## 2019-12-17 ENCOUNTER — Telehealth: Payer: Self-pay | Admitting: Hematology

## 2019-12-17 NOTE — Telephone Encounter (Signed)
I talk with patient regarding 1/26 °

## 2019-12-21 ENCOUNTER — Other Ambulatory Visit (HOSPITAL_COMMUNITY): Payer: TRICARE For Life (TFL)

## 2019-12-27 ENCOUNTER — Institutional Professional Consult (permissible substitution): Payer: TRICARE For Life (TFL) | Admitting: Emergency Medicine

## 2020-01-04 ENCOUNTER — Other Ambulatory Visit: Payer: Self-pay

## 2020-01-04 ENCOUNTER — Ambulatory Visit (HOSPITAL_COMMUNITY): Payer: Medicare Other | Attending: Internal Medicine

## 2020-01-04 DIAGNOSIS — R0600 Dyspnea, unspecified: Secondary | ICD-10-CM | POA: Diagnosis not present

## 2020-01-04 DIAGNOSIS — R0609 Other forms of dyspnea: Secondary | ICD-10-CM

## 2020-01-04 MED ORDER — PERFLUTREN LIPID MICROSPHERE
1.0000 mL | INTRAVENOUS | Status: AC | PRN
  Administered 2020-01-04: 2 mL via INTRAVENOUS

## 2020-01-06 ENCOUNTER — Ambulatory Visit: Payer: TRICARE For Life (TFL)

## 2020-01-09 ENCOUNTER — Telehealth: Payer: Self-pay

## 2020-01-09 DIAGNOSIS — R0600 Dyspnea, unspecified: Secondary | ICD-10-CM

## 2020-01-09 DIAGNOSIS — I5032 Chronic diastolic (congestive) heart failure: Secondary | ICD-10-CM

## 2020-01-09 DIAGNOSIS — R0609 Other forms of dyspnea: Secondary | ICD-10-CM

## 2020-01-09 NOTE — Telephone Encounter (Signed)
The patient has been notified of the result and verbalized understanding.  All questions (if any) were answered. Antonieta Iba, RN 01/09/2020 2:36 PM  Patient has been scheduled for lab work.

## 2020-01-09 NOTE — Telephone Encounter (Signed)
-----   Message from Sueanne Margarita, MD sent at 01/06/2020 12:21 PM EST ----- 2D echo showed normal heart function with stiffness of heart muscle and increased pressures in LV, mildly enlarged RV.  Please have her come in for BNP and BMET to make sure she does not have diastolic CHF as etiology of her SOB

## 2020-01-14 ENCOUNTER — Ambulatory Visit (INDEPENDENT_AMBULATORY_CARE_PROVIDER_SITE_OTHER): Payer: Medicare Other | Admitting: Emergency Medicine

## 2020-01-14 ENCOUNTER — Encounter: Payer: Self-pay | Admitting: Emergency Medicine

## 2020-01-14 ENCOUNTER — Other Ambulatory Visit: Payer: Self-pay

## 2020-01-14 VITALS — BP 145/74 | HR 61 | Temp 98.4°F | Ht 64.0 in | Wt 274.0 lb

## 2020-01-14 DIAGNOSIS — G4733 Obstructive sleep apnea (adult) (pediatric): Secondary | ICD-10-CM

## 2020-01-14 DIAGNOSIS — J849 Interstitial pulmonary disease, unspecified: Secondary | ICD-10-CM | POA: Diagnosis not present

## 2020-01-14 DIAGNOSIS — R0600 Dyspnea, unspecified: Secondary | ICD-10-CM | POA: Insufficient documentation

## 2020-01-14 DIAGNOSIS — R0602 Shortness of breath: Secondary | ICD-10-CM | POA: Diagnosis not present

## 2020-01-14 NOTE — Assessment & Plan Note (Signed)
Persistent severe fatigue, dyspnea, weakness following her Covid diagnosis and her pacemaker placement.  Chest x-ray at the time of her hospitalization was reassuring but certainly must consider evolving interstitial lung disease following Covid pneumonitis.  Believe she needs a high-resolution CT scan of the chest to investigate fully.  We will also arrange for pulmonary function testing to assess for other primary pulmonary contributions to her dyspnea.  Consider also deconditioning post Covid illness, contribution of her weight gain.  She is following closely with cardiology and electrophysiology.

## 2020-01-14 NOTE — Patient Instructions (Signed)
We will perform a high-resolution CT scan of the chest We will perform full pulmonary function testing Follow with Dr. Lamonte Sakai next available with full pulmonary function testing on the same day.

## 2020-01-14 NOTE — Progress Notes (Signed)
Subjective:    Patient ID: Meagan Ryan, female    DOB: [birth year], 64 y.o.   MRN: [id number]  HPI 64 year old never smoker with a history of breast cancer (1996, 2019), hypertension, FM, OSA (not on CPAP), cardiomyopathy associated with chemotherapy, LVEF 30 to 35% in 2016, improved to 50 to 55% most recently.  She is referred today for weakness, dyspnea, lack of any energy, persistent cough.  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 The pacer has been adjusted without any improvement in ger dyspnea and fatigue. She was discharged on RA.   She has kept cough, happens w paroxysms, often at night but not exclusively. Non-productive. She is on flonase, zyrtec, minimal astelin use. GERD well controlled on omeprazole. Her dyspnea can happen at any time. She has had some increased weakness since starting anastrozole. She has to stop to rest when washing clothes. She has to rest when shopping. She has gained about 45 lbs since last year, possible contributor. No snoring.    Review of Systems  Constitutional: Positive for unexpected weight change. Negative for fever.  HENT: Negative for congestion, dental problem, ear pain, nosebleeds, postnasal drip, rhinorrhea, sinus pressure, sneezing, sore throat and trouble swallowing.   Eyes: Negative for redness and itching.  Respiratory: Positive for cough and shortness of breath. Negative for chest tightness and wheezing.   Cardiovascular: Negative for palpitations and leg swelling.  Gastrointestinal: Negative for nausea and vomiting.  Genitourinary: Negative for dysuria.  Musculoskeletal: Positive for joint swelling.  Skin: Negative for rash.  Allergic/Immunologic: Negative.  Negative for environmental allergies, food allergies and immunocompromised state.  Neurological: Negative for headaches.  Hematological: Does not bruise/bleed easily.  Psychiatric/Behavioral:  Negative for dysphoric mood. The patient is nervous/anxious.     Past Medical History:  Diagnosis Date  . Allergic rhinitis   . Asthma   . Breast cancer ([place]) 1996   Left breast-stage II infiltrating ductal carcinoma, 3/8 axillary lymph nodes were positive for metastatic carcinoma, getting yearly mammogram and every other year MRI followed by oncologist Dr Benay Spice  . Complication of anesthesia    WAKE UP WITH HEADACHE, difficulty waking up  . DCM (dilated cardiomyopathy) ([place])    EF 55-60% by echo 04/2019  . Dysuria 2011  . Family history of adverse reaction to anesthesia    "family wakes up with personality changes, but are very short term"  . Fatty liver CT 1/12   elevated LFT  . Genetic testing 03/13/2018   Common cancers panel (47 genes) @ [facility] - No pathogenic mutations detected  . H/O fibromyalgia   . H/O osteopenia 2009  . H/O peripheral neuropathy   . H/O varicose veins   . Hyperlipidemia   . Hypertension   . Incomplete RBBB   . 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 OSA (obstructive sleep apnea) 12/31/2015   Very mild with AHI 6/hr but elevated RDI at 32/hr.  Intolerant to PAP therapy  . Pelvic pain in female   . PMB (postmenopausal bleeding) 2011  . Pregnancy induced hypertension   . Right-sided lacunar infarction ([place])    MRI 1.2012  . Tubular adenoma of colon      Family History  Problem Relation Age of Onset  . Heart disease Mother        MI  . Stroke Mother   . Diabetes Mother   . Hypertension Mother   .  Stroke Father   . Prostate cancer Father 108       deceased 52  . Colon cancer Sister 73       currently 10  . Breast cancer Paternal Aunt 92       deceased 76s  . Breast cancer Cousin        daughter of pat aunt w/ breast ca  . Breast cancer Cousin        daughter of pat aunt w/ breast ca     Social History   Socioeconomic History  . Marital status: Widowed    Spouse name: Not on file  . Number of children: Not on file  . Years of  education: Not on file  . Highest education level: Not on file  Occupational History  . Not on file  Tobacco Use  . Smoking status: Never Smoker  . Smokeless tobacco: Never Used  Substance and Sexual Activity  . Alcohol use: No  . Drug use: No  . Sexual activity: Not on file  Other Topics Concern  . 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 Difficulty of Paying Living Expenses: Not on file  Food Insecurity:   . Worried About Charity fundraiser in the Last Year: Not on file  . Ran Out of Food in the Last Year: Not on file  Transportation Needs:   . Lack of Transportation (Medical): Not on file  . Lack of Transportation (Non-Medical): Not on file  Physical Activity:   . Days of Exercise per Week: Not on file  . Minutes of Exercise per Session: Not on file  Stress:   . Feeling of Stress : Not on file  Social Connections:   . Frequency of Communication with Friends and Family: Not on file  . Frequency of Social Gatherings with Friends and Family: Not on file  . Attends Religious Services: Not on file  . Active Member of Clubs or Organizations: Not on file  . Attends Archivist Meetings: Not on file  . Marital Status: Not on file  Intimate Partner Violence:   . Fear of Current or Ex-Partner: Not on file  . Emotionally Abused: Not on file  . Physically Abused: Not on file  . Sexually Abused: Not on file    Was in the [place] Has lived in [place] Has worked in Academic librarian, no known inhaled exposures from this. May have been exposed to dyes.   Allergies  Allergen Reactions  . Amlodipine Shortness Of Breath  . Noroxin [Norfloxacin] Shortness  Of Breath  . Cephalexin Other (See Comments) and Nausea And Vomiting    Fever got to 105 High temperature  . Chocolate     Migraines   . Codeine Nausea And Vomiting  . Sulfa Antibiotics Nausea And Vomiting  . Latex Itching and Rash    redness  . Penicillins Rash    Has patient had a PCN reaction causing immediate rash, facial/tongue/throat swelling, SOB or lightheadedness with hypotension:No Has patient had a PCN reaction causing severe rash involving mucus membranes or skin necrosis: Yes Has patient had a PCN reaction that  required hospitalization No Has patient had a PCN reaction occurring within the last 10 years: No If all of the above answers are "NO", then may proceed with Cephalosporin use.      Outpatient Medications Prior to Visit  Medication Sig Dispense Refill  . ALPRAZolam (XANAX) 0.5 MG tablet Take 0.5 mg by mouth 2 (two) times daily.     Marland Kitchen anastrozole (ARIMIDEX) 1 MG tablet Take 1 tablet (1 mg total) by mouth daily. 90 tablet 3  . azelastine (ASTELIN) 0.1 % nasal spray Place 1 spray into both nostrils 2 (two) times daily as needed for rhinitis or allergies.     . carvedilol (COREG) 6.25 MG tablet Take 1 tablet (6.25 mg total) by mouth 2 (two) times daily with a meal. 180 tablet 3  . cetirizine (ZYRTEC) 10 MG tablet Take 10 mg by mouth every morning.     . DULoxetine (CYMBALTA) 30 MG capsule Take 30 mg by mouth at bedtime.     . fluticasone (FLONASE) 50 MCG/ACT nasal spray Place 1 spray into both nostrils every morning.     . gabapentin (NEURONTIN) 300 MG capsule Take 300 mg by mouth 2 (two) times daily.    . ivabradine (CORLANOR) 5 MG TABS tablet Take 1 tablet (5 mg total) by mouth 2 (two) times daily with a meal. 180 tablet 3  . niacin 500 MG tablet Take 500 mg by mouth daily.    . nortriptyline (PAMELOR) 25 MG capsule Take 25 mg by mouth at bedtime as needed for sleep.     . Omega-3 Fatty Acids (FISH OIL) 1000 MG CAPS Take 2,000 mg by mouth 2 (two) times daily.    Marland Kitchen  omeprazole (PRILOSEC) 20 MG capsule Take 20 mg by mouth daily.    . potassium chloride (K-DUR) 10 MEQ tablet Take 10 mEq by mouth daily.    . sacubitril-valsartan (ENTRESTO) 24-26 MG Take 1 tablet by mouth 2 (two) times daily. 180 tablet 3  . doxycycline (VIBRA-TABS) 100 MG tablet Take 1 tablet (100 mg total) by mouth 2 (two) times daily. 1 po bid 20 tablet 0   No facility-administered medications prior to visit.        Objective:   Physical Exam Vitals:   01/14/20 1544  BP: (!) 145/74  Pulse: 61  Temp: 98.4 F (36.9 C)  TempSrc: Temporal  SpO2: 97%  Weight: 274 lb (124.3 kg)  Height: 5\' 4"  (1.626 m)   Gen: Pleasant, overwt woman, in no distress,  normal affect  ENT: No lesions,  mouth clear,  oropharynx clear, no postnasal drip  Neck: No JVD, no stridor  Lungs: No use of accessory muscles, distant, no crackles or wheezing on normal respiration, no wheeze on forced expiration  Cardiovascular: RRR, heart sounds normal, no murmur or gallops, no peripheral edema  Musculoskeletal: No deformities, no cyanosis or clubbing  Neuro: alert, awake, non focal  Skin: Warm, no lesions or rash      Assessment & Plan:  OSA (obstructive sleep apnea) Not on CPAP. May need to consider repeat PSG given her wt gain over the last year. She denies any snoring  Dyspnea Persistent severe fatigue, dyspnea, weakness following her Covid diagnosis and her pacemaker placement.  Chest x-ray at the time of her hospitalization was reassuring but certainly must consider evolving interstitial lung disease following Covid pneumonitis.  Believe she needs a high-resolution CT scan of the chest to investigate fully.  We will also arrange for pulmonary function testing to assess  for other primary pulmonary contributions to her dyspnea.  Consider also deconditioning post Covid illness, contribution of her weight gain.  She is following closely with cardiology and electrophysiology.  Baltazar Apo, MD,  PhD 01/14/2020, 5:39 PM [facility] [phone] or if no answer [phone]

## 2020-01-14 NOTE — Assessment & Plan Note (Signed)
Not on CPAP. May need to consider repeat PSG given her wt gain over the last year. She denies any snoring

## 2020-01-15 ENCOUNTER — Telehealth: Payer: Self-pay

## 2020-01-15 ENCOUNTER — Ambulatory Visit (INDEPENDENT_AMBULATORY_CARE_PROVIDER_SITE_OTHER): Payer: Medicare Other | Admitting: *Deleted

## 2020-01-15 DIAGNOSIS — I441 Atrioventricular block, second degree: Secondary | ICD-10-CM

## 2020-01-15 LAB — CUP PACEART REMOTE DEVICE CHECK
Battery Remaining Longevity: 140 mo
Battery Voltage: 3.05 V
Brady Statistic AP VP Percent: 9.31 %
Brady Statistic AP VS Percent: 16.76 %
Brady Statistic AS VP Percent: 32.22 %
Brady Statistic AS VS Percent: 41.71 %
Brady Statistic RA Percent Paced: 25.72 %
Brady Statistic RV Percent Paced: 41.66 %
Date Time Interrogation Session: 20210308194945
Implantable Lead Implant Date: 20200608
Implantable Lead Implant Date: 20200608
Implantable Lead Location: 753859
Implantable Lead Location: 753860
Implantable Lead Model: 5076
Implantable Lead Model: 5076
Implantable Pulse Generator Implant Date: 20200608
Lead Channel Impedance Value: 342 Ohm
Lead Channel Impedance Value: 361 Ohm
Lead Channel Impedance Value: 380 Ohm
Lead Channel Impedance Value: 456 Ohm
Lead Channel Pacing Threshold Amplitude: 0.75 V
Lead Channel Pacing Threshold Amplitude: 0.75 V
Lead Channel Pacing Threshold Pulse Width: 0.4 ms
Lead Channel Pacing Threshold Pulse Width: 0.4 ms
Lead Channel Sensing Intrinsic Amplitude: 15.625 mV
Lead Channel Sensing Intrinsic Amplitude: 15.625 mV
Lead Channel Sensing Intrinsic Amplitude: 2.875 mV
Lead Channel Sensing Intrinsic Amplitude: 2.875 mV
Lead Channel Setting Pacing Amplitude: 1.5 V
Lead Channel Setting Pacing Amplitude: 2.5 V
Lead Channel Setting Pacing Pulse Width: 0.4 ms
Lead Channel Setting Sensing Sensitivity: 2.8 mV

## 2020-01-15 NOTE — Telephone Encounter (Signed)
Remote transmission received- indicating 3 AF episodes the longest of 6 hours on 3/7.  No documented history of AF, pt not on [place].  Spoke with pt she denies any new symptoms on day of 3/7 episode.  She does report that she has had increased fatigue x1-2 weeks.  Pt confirmed current med list up to date.

## 2020-01-16 NOTE — Progress Notes (Signed)
PPM Remote  

## 2020-01-18 ENCOUNTER — Other Ambulatory Visit: Payer: Self-pay

## 2020-01-18 ENCOUNTER — Other Ambulatory Visit: Payer: Medicare Other | Admitting: *Deleted

## 2020-01-18 DIAGNOSIS — R0609 Other forms of dyspnea: Secondary | ICD-10-CM

## 2020-01-18 DIAGNOSIS — R0600 Dyspnea, unspecified: Secondary | ICD-10-CM

## 2020-01-18 DIAGNOSIS — I5032 Chronic diastolic (congestive) heart failure: Secondary | ICD-10-CM

## 2020-01-19 LAB — BASIC METABOLIC PANEL
BUN/Creatinine Ratio: 7 — ABNORMAL LOW (ref 12–28)
BUN: 7 mg/dL — ABNORMAL LOW (ref 8–27)
CO2: 21 mmol/L (ref 20–29)
Calcium: 9.8 mg/dL (ref 8.7–10.3)
Chloride: 97 mmol/L (ref 96–106)
Creatinine, Ser: 1.06 mg/dL — ABNORMAL HIGH (ref 0.57–1.00)
GFR calc Af Amer: 64 mL/min/{1.73_m2} (ref >59)
GFR calc non Af Amer: 55 mL/min/{1.73_m2} — ABNORMAL LOW (ref >59)
Glucose: 82 mg/dL (ref 65–99)
Potassium: 4.3 mmol/L (ref 3.5–5.2)
Sodium: 134 mmol/L (ref 134–144)

## 2020-01-19 LAB — PRO B NATRIURETIC PEPTIDE: NT-Pro BNP: 110 pg/mL (ref 0–301)

## 2020-01-24 ENCOUNTER — Ambulatory Visit (HOSPITAL_COMMUNITY): Payer: Medicare Other

## 2020-01-30 ENCOUNTER — Encounter (HOSPITAL_COMMUNITY): Payer: Self-pay | Admitting: Nurse Practitioner

## 2020-01-30 ENCOUNTER — Other Ambulatory Visit: Payer: Self-pay

## 2020-01-30 ENCOUNTER — Ambulatory Visit (HOSPITAL_COMMUNITY)
Admission: RE | Admit: 2020-01-30 | Discharge: 2020-01-30 | Disposition: A | Discharge status: Home / Self Care | Payer: Medicare Other | Source: Ambulatory Visit | Attending: Emergency Medicine | Admitting: Emergency Medicine

## 2020-01-30 ENCOUNTER — Encounter (HOSPITAL_COMMUNITY): Payer: Self-pay

## 2020-01-30 ENCOUNTER — Other Ambulatory Visit: Payer: Self-pay | Admitting: *Deleted

## 2020-01-30 ENCOUNTER — Telehealth: Payer: Self-pay | Admitting: *Deleted

## 2020-01-30 ENCOUNTER — Ambulatory Visit (HOSPITAL_BASED_OUTPATIENT_CLINIC_OR_DEPARTMENT_OTHER)
Admission: RE | Admit: 2020-01-30 | Discharge: 2020-01-30 | Disposition: A | Discharge status: Home / Self Care | Payer: Medicare Other | Source: Ambulatory Visit | Attending: Nurse Practitioner | Admitting: Nurse Practitioner

## 2020-01-30 VITALS — BP 170/98 | HR 60 | Ht 64.0 in | Wt 239.0 lb

## 2020-01-30 DIAGNOSIS — I480 Paroxysmal atrial fibrillation: Secondary | ICD-10-CM | POA: Insufficient documentation

## 2020-01-30 DIAGNOSIS — J849 Interstitial pulmonary disease, unspecified: Secondary | ICD-10-CM

## 2020-01-30 MED ORDER — CARVEDILOL 12.5 MG PO TABS: 12.5000 mg | ORAL_TABLET | Freq: Two times a day (BID) | ORAL | 3 refills | Status: DC

## 2020-01-30 MED ORDER — APIXABAN 5 MG PO TABS: 5.0000 mg | ORAL_TABLET | Freq: Two times a day (BID) | ORAL | 3 refills | Status: DC

## 2020-01-30 NOTE — Telephone Encounter (Signed)
-----   Message from Will Meredith Leeds, MD sent at 01/16/2020 11:24 AM EST ----- Abnormal device interrogation reviewed.  Lead parameters and battery status stable.  AF noted. Needs f/u in AF clinic.

## 2020-01-30 NOTE — Telephone Encounter (Signed)
I spoke with afib clinic and they scheduled patient to see Roderic Palau, NP today at 3:00.  I spoke with patient and reviewed device interrogation with her.  She will plan on being at appointment this afternoon

## 2020-01-30 NOTE — Progress Notes (Signed)
Primary Care Physician: Hulan Fess, MD Referring Physician: Device  Clinic EP: [facility]  Cardiologist: Dr.Turner    Meagan Ryan is a 64 y.o. female with a h/o DCM 2/2 treatment of breast cancer in 1996, HTN, Covid in June of 2020, associated with bradycardia and  HB, receiving PPM. She  appears to have symptoms of long hauler syndrome with continued weakness/ shortness of breath. She had a chest CT today ordered by Dr. Lamonte Sakai to further evaluate these symptoms.   I am seeing her today as afib was discovered on her device. She  had 6 hours of afib on March 6th, but a few short episodes were noted before that. Interesting enough, she is allergic to Tide detergent and her daughter had washed her clothes for her and  used Tide . She broke out in whelps and itching. She took a shower and got extremely weak and her family had to help her out of the shower. She laid down, took  benadryl and felt better later in the day. Her BP is elevated today on presentation 170/98, on recheck 150/90. She  has noted increase in BP over the last few weeks. She states that she has had sleep studies in the past with mild obstruction, did not recommend CPAP. CHA2DS2VASc score is 4( age/female/CM/ HTN)   Today, she denies symptoms of palpitations, chest pain, shortness of breath, orthopnea, PND, lower extremity edema, dizziness, presyncope, syncope, or neurologic sequela. The patient is tolerating medications without difficulties and is otherwise without complaint today.   Past Medical History:  Diagnosis Date  . Allergic rhinitis   . Asthma   . Breast cancer ([place]) 1996   Left breast-stage II infiltrating ductal carcinoma, 3/8 axillary lymph nodes were positive for metastatic carcinoma, getting yearly mammogram and every other year MRI followed by oncologist Dr Benay Spice  . Complication of anesthesia    WAKE UP WITH HEADACHE, difficulty waking up  . DCM (dilated cardiomyopathy) ([place])    EF 55-60% by echo 04/2019    . Dysuria 2011  . Family history of adverse reaction to anesthesia    "family wakes up with personality changes, but are very short term"  . Fatty liver CT 1/12   elevated LFT  . Genetic testing 03/13/2018   Common cancers panel (47 genes) @ [facility] - No pathogenic mutations detected  . H/O fibromyalgia   . H/O osteopenia 2009  . H/O peripheral neuropathy   . H/O varicose veins   . Hyperlipidemia   . Hypertension   . Incomplete RBBB   . 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 OSA (obstructive sleep apnea) 12/31/2015   Very mild with AHI 6/hr but elevated RDI at 32/hr.  Intolerant to PAP therapy  . Pelvic pain in female   . PMB (postmenopausal bleeding) 2011  . Pregnancy induced hypertension   . Right-sided lacunar infarction ([place])    MRI 1.2012  . Tubular adenoma of colon    Past Surgical History:  Procedure Laterality Date  . Gastroc muscle repair  2007  . LAPAROSCOPIC CHOLECYSTECTOMY    . MASTECTOMY  06/1995   Left - for infiltrating ductal carcinoma  . MASTECTOMY W/ SENTINEL NODE BIOPSY Right 03/31/2018  . MASTECTOMY W/ SENTINEL NODE BIOPSY Right 03/31/2018   Procedure: RIGHT TOTAL MASTECTOMY WITH AXILLARY SENTINEL LYMPH NODE BIOPSY ERAS PATHWAY;  Surgeon: Excell Seltzer, MD;  Location: [facility];  Service: General;  Laterality: Right;  . 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 PACEMAKER IMPLANT N/A 04/16/2019   Procedure: PACEMAKER IMPLANT;  Surgeon: Constance Haw, MD;  Location: [facility];  Service: Cardiovascular;  Laterality: N/A;  . ROBOTIC ASSISTED TOTAL HYSTERECTOMY WITH BILATERAL SALPINGO OOPHERECTOMY Bilateral 10/09/2015   Procedure: ROBOTIC ASSISTED BILATERAL SALPINGO OOPHORECTOMY ;  Surgeon: Everitt Amber, MD;  Location: [facility];  Service: Gynecology;  Laterality: Bilateral;  . Tram surgery  1996  . TUBAL LIGATION      Current Outpatient Medications  Medication Sig Dispense Refill  . ALPRAZolam (XANAX) 0.5 MG tablet Take 0.5 mg by  mouth 2 (two) times daily.     Marland Kitchen anastrozole (ARIMIDEX) 1 MG tablet Take 1 tablet (1 mg total) by mouth daily. 90 tablet 3  . carvedilol (COREG) 12.5 MG tablet Take 1 tablet (12.5 mg total) by mouth 2 (two) times daily with a meal. 60 tablet 3  . cetirizine (ZYRTEC) 10 MG tablet Take 10 mg by mouth every morning.     . DULoxetine (CYMBALTA) 30 MG capsule Take 30 mg by mouth at bedtime.     . fluticasone (FLONASE) 50 MCG/ACT nasal spray Place 1 spray into both nostrils every morning.     . gabapentin (NEURONTIN) 300 MG capsule Take 300 mg by mouth 2 (two) times daily.    . ivabradine (CORLANOR) 5 MG TABS tablet Take 1 tablet (5 mg total) by mouth 2 (two) times daily with a meal. 180 tablet 3  . niacin 500 MG tablet Take 500 mg by mouth daily.    . Omega-3 Fatty Acids (FISH OIL) 1000 MG CAPS Take 2,000 mg by mouth 2 (two) times daily.    Marland Kitchen omeprazole (PRILOSEC) 20 MG capsule Take 20 mg by mouth daily.    . potassium chloride (K-DUR) 10 MEQ tablet Take 10 mEq by mouth daily.    . sacubitril-valsartan (ENTRESTO) 24-26 MG Take 1 tablet by mouth 2 (two) times daily. 180 tablet 3  . VITAMIN D PO Take 500 mg by mouth daily.    Marland Kitchen apixaban (ELIQUIS) 5 MG TABS tablet Take 1 tablet (5 mg total) by mouth 2 (two) times daily. 60 tablet 3   No current facility-administered medications for this encounter.    Allergies  Allergen Reactions  . Amlodipine Shortness Of Breath  . Noroxin [Norfloxacin] Shortness Of Breath  . Cephalexin Other (See Comments) and Nausea And Vomiting    Fever got to 105 High temperature  . Chocolate     Migraines   . Codeine Nausea And Vomiting  . Sulfa Antibiotics Nausea And Vomiting  . Latex Itching and Rash    redness  . Penicillins Rash    Has patient had a PCN reaction causing immediate rash, facial/tongue/throat swelling, SOB or lightheadedness with hypotension:No Has patient had a PCN reaction causing severe rash involving mucus membranes or skin necrosis: Yes Has  patient had a PCN reaction that required hospitalization No Has patient had a PCN reaction occurring within the last 10 years: No If all of the above answers are "NO", then may proceed with Cephalosporin use.     Social History   Socioeconomic History  . Marital status: Widowed    Spouse name: Not on file  . Number of children: Not on file  . Years of education: Not on file  . Highest education level: Not on file  Occupational History  . Not on file  Tobacco Use  . Smoking status: Never Smoker  . Smokeless tobacco: Never Used  Substance and Sexual Activity  . Alcohol use: No  . Drug use: No  . Sexual activity: Not on file  Other Topics Concern  . 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 Difficulty of Paying Living Expenses:   Food Insecurity:   . Worried About Charity fundraiser in the Last Year:   . Arboriculturist in the Last Year:   Transportation Needs:   . Film/video editor (Medical):   Marland Kitchen Lack of Transportation (Non-Medical):   Physical Activity:   . Days of Exercise per Week:   . Minutes of Exercise per Session:   Stress:   . Feeling of Stress :   Social Connections:   . Frequency of Communication with Friends and Family:   . Frequency of Social Gatherings with Friends and Family:   . Attends Religious Services:   . Active Member of Clubs or Organizations:   . Attends Archivist Meetings:   Marland Kitchen Marital Status:   Intimate Partner Violence:   . Fear of Current or Ex-Partner:   . Emotionally Abused:   Marland Kitchen Physically Abused:   . Sexually Abused:     Family History  Problem Relation Age of Onset  . Heart disease Mother        MI    . Stroke Mother   . Diabetes Mother   . Hypertension Mother   . Stroke Father   . Prostate cancer Father 22       deceased 42  . Colon cancer Sister 81       currently 32  . Breast cancer Paternal Aunt 37       deceased 84s  . Breast cancer Cousin        daughter of pat aunt w/ breast ca  . Breast cancer Cousin        daughter of pat aunt w/ breast ca    ROS- All systems are reviewed and negative except as per the HPI above  Physical Exam: Vitals:   01/30/20 1436  BP: (!) 170/98  Pulse: 60  Weight: 108.4 kg  Height: 5\' 4"  (1.626 m)   Wt Readings from Last 3 Encounters:  01/30/20 108.4 kg  01/14/20 124.3 kg  12/11/19 109.8 kg    Labs: Lab Results  Component Value Date   NA 134 01/18/2020   K 4.3 01/18/2020   CL 97 01/18/2020   CO2 21 01/18/2020   GLUCOSE 82 01/18/2020   BUN 7 (L) 01/18/2020   CREATININE 1.06 (H) 01/18/2020   CALCIUM 9.8 01/18/2020   No results found for: INR Lab Results  Component Value Date   TRIG 110 04/15/2019     GEN- The patient is well appearing, alert and oriented x 3 today.   Head- normocephalic, atraumatic Eyes-  Sclera clear, conjunctiva pink Ears- hearing intact Oropharynx- clear Neck- supple, no JVP Lymph- no cervical lymphadenopathy Lungs- Clear to ausculation bilaterally, normal work of breathing Heart- Regular rate and rhythm, no murmurs, rubs or gallops, PMI not laterally displaced GI- soft, NT, ND, + BS Extremities- no clubbing, cyanosis, or edema MS- no significant deformity or  atrophy Skin- no rash or lesion Psych- euthymic mood, full affect Neuro- strength and sensation are intact  EKG-AV dual paced rhythm at 60 bpm Echo-1. Left ventricular ejection fraction, by estimation, is 55 to 60%. The  left ventricle has normal function. The left ventricle has no regional  wall motion abnormalities. Left ventricular diastolic parameters are  consistent with Grade II diastolic  dysfunction (pseudonormalization).  Elevated left ventricular end-diastolic  pressure.  2. Right ventricular systolic function is normal. The right ventricular  size is mildly enlarged.  3. The mitral valve is abnormal. Trivial mitral valve regurgitation. No  evidence of mitral stenosis.  4. The aortic valve is abnormal. Aortic valve regurgitation is not  visualized. No aortic stenosis is present.   Device report- Remote transmission received- indicating 3 AF episodes the longest of 6 hours on 3/7.  No documented history of AF, pt not on [place].  Spoke with pt she denies any new symptoms on day of 3/7 episode.  She does report that she has had increased fatigue x1-2 weeks.  Pt confirmed current med list up to date.    Assessment and Plan: 1. New onset afib  Noted on PPM Today is a paced General  description of afib  Triggers discussed  Will increase carvedilol to 12.5 mg bid to help offset any further occurrence of afib  and to help with HTN control   2. CHA2DS2VASc score of 4 Discussed anticoagulation  Denies a bleeding history Will start eliquis  5 mg bid  Bleeding precautions discussed   3. PPM Per Dr. Curt Bears  4. HTN Not controlled today  Will increase BB  5. NICM Per Dr. Radford Pax  No edema issues   6. Ongoing dyspnea with Covid in June Ct of chest done today and results pending  Followed by Dr. Lamonte Sakai   I will see back in 3 weeks with bmet/cbc  Butch Penny C. Dajanae Brophy, [facility] [address] [place], [place] [address] [phone]

## 2020-01-30 NOTE — Patient Instructions (Signed)
Increase coreg 12.5mg  twice a day  Start Eliquis 5mg  twice a day

## 2020-01-31 ENCOUNTER — Ambulatory Visit: Payer: Medicare Other | Attending: Internal Medicine

## 2020-01-31 ENCOUNTER — Telehealth (HOSPITAL_COMMUNITY): Payer: Self-pay | Admitting: *Deleted

## 2020-01-31 DIAGNOSIS — Z23 Encounter for immunization: Secondary | ICD-10-CM

## 2020-01-31 NOTE — Telephone Encounter (Signed)
Patient called in at 1pm with complaint of rash/itching that started today about 15 minutes after taking Eliquis. Patient also received her first dose of covid vaccine this morning. She will take a dose of benadryl and call back with update.

## 2020-01-31 NOTE — Progress Notes (Signed)
   Covid-19 Vaccination Clinic  Name:  Meagan Ryan    MRN: [id number] DOB: 04/15/1956  01/31/2020  [name] was observed post Covid-19 immunization for 15 minutes without incident. She was provided with Vaccine Information Sheet and instruction to access the V-Safe system.   [name] was instructed to call 911 with any severe reactions post vaccine: Marland Kitchen Difficulty breathing  . Swelling of face and throat  . A fast heartbeat  . A bad rash all over body  . Dizziness and weakness   Immunizations Administered    Name Date Dose VIS Date Route   Moderna COVID-19 Vaccine 01/31/2020  8:46 AM 0.5 mL 10/09/2019 Intramuscular   Manufacturer: Moderna   Lot: [id number]   [id number]

## 2020-02-01 NOTE — Telephone Encounter (Signed)
Called to check on patient. She now has chills and full body aches but the rash is gone. She will continue Eliquis and report her response of vaccine to her PCP as this was her first dose of vaccine. Pt verbalized understanding.

## 2020-02-20 ENCOUNTER — Ambulatory Visit (HOSPITAL_COMMUNITY)
Admission: RE | Admit: 2020-02-20 | Discharge: 2020-02-20 | Disposition: A | Discharge status: Home / Self Care | Payer: Medicare Other | Source: Ambulatory Visit | Attending: Nurse Practitioner | Admitting: Nurse Practitioner

## 2020-02-20 ENCOUNTER — Other Ambulatory Visit: Payer: Self-pay

## 2020-02-20 ENCOUNTER — Encounter (HOSPITAL_COMMUNITY): Payer: Self-pay | Admitting: Nurse Practitioner

## 2020-02-20 VITALS — BP 160/110 | HR 60 | Ht 64.0 in | Wt 239.0 lb

## 2020-02-20 DIAGNOSIS — Z882 Allergy status to sulfonamides status: Secondary | ICD-10-CM | POA: Insufficient documentation

## 2020-02-20 DIAGNOSIS — Z8616 Personal history of COVID-19: Secondary | ICD-10-CM | POA: Insufficient documentation

## 2020-02-20 DIAGNOSIS — Z8673 Personal history of transient ischemic attack (TIA), and cerebral infarction without residual deficits: Secondary | ICD-10-CM | POA: Insufficient documentation

## 2020-02-20 DIAGNOSIS — Z8249 Family history of ischemic heart disease and other diseases of the circulatory system: Secondary | ICD-10-CM | POA: Insufficient documentation

## 2020-02-20 DIAGNOSIS — I4891 Unspecified atrial fibrillation: Secondary | ICD-10-CM | POA: Insufficient documentation

## 2020-02-20 DIAGNOSIS — I480 Paroxysmal atrial fibrillation: Secondary | ICD-10-CM

## 2020-02-20 DIAGNOSIS — Z803 Family history of malignant neoplasm of breast: Secondary | ICD-10-CM | POA: Insufficient documentation

## 2020-02-20 DIAGNOSIS — Z7901 Long term (current) use of anticoagulants: Secondary | ICD-10-CM | POA: Diagnosis not present

## 2020-02-20 DIAGNOSIS — G4733 Obstructive sleep apnea (adult) (pediatric): Secondary | ICD-10-CM | POA: Insufficient documentation

## 2020-02-20 DIAGNOSIS — Z853 Personal history of malignant neoplasm of breast: Secondary | ICD-10-CM | POA: Insufficient documentation

## 2020-02-20 DIAGNOSIS — Z888 Allergy status to other drugs, medicaments and biological substances status: Secondary | ICD-10-CM | POA: Insufficient documentation

## 2020-02-20 DIAGNOSIS — D6869 Other thrombophilia: Secondary | ICD-10-CM | POA: Diagnosis not present

## 2020-02-20 DIAGNOSIS — I10 Essential (primary) hypertension: Secondary | ICD-10-CM | POA: Insufficient documentation

## 2020-02-20 DIAGNOSIS — I4510 Unspecified right bundle-branch block: Secondary | ICD-10-CM | POA: Diagnosis not present

## 2020-02-20 DIAGNOSIS — Z881 Allergy status to other antibiotic agents status: Secondary | ICD-10-CM | POA: Diagnosis not present

## 2020-02-20 DIAGNOSIS — Z880 Allergy status to penicillin: Secondary | ICD-10-CM | POA: Diagnosis not present

## 2020-02-20 DIAGNOSIS — I428 Other cardiomyopathies: Secondary | ICD-10-CM | POA: Insufficient documentation

## 2020-02-20 DIAGNOSIS — Z950 Presence of cardiac pacemaker: Secondary | ICD-10-CM | POA: Diagnosis not present

## 2020-02-20 DIAGNOSIS — Z79899 Other long term (current) drug therapy: Secondary | ICD-10-CM | POA: Insufficient documentation

## 2020-02-20 DIAGNOSIS — E785 Hyperlipidemia, unspecified: Secondary | ICD-10-CM | POA: Diagnosis not present

## 2020-02-20 DIAGNOSIS — Z823 Family history of stroke: Secondary | ICD-10-CM | POA: Insufficient documentation

## 2020-02-20 DIAGNOSIS — Z885 Allergy status to narcotic agent status: Secondary | ICD-10-CM | POA: Insufficient documentation

## 2020-02-20 LAB — CBC
HCT: 42.2 % (ref 36.0–46.0)
Hemoglobin: 13.1 g/dL (ref 12.0–15.0)
MCH: 26 pg (ref 26.0–34.0)
MCHC: 31 g/dL (ref 30.0–36.0)
MCV: 83.9 fL (ref 80.0–100.0)
Platelets: 253 10*3/uL (ref 150–400)
RBC: 5.03 MIL/uL (ref 3.87–5.11)
RDW: 15.7 % — ABNORMAL HIGH (ref 11.5–15.5)
WBC: 5 10*3/uL (ref 4.0–10.5)
nRBC: 0 % (ref 0.0–0.2)

## 2020-02-20 LAB — BASIC METABOLIC PANEL
Anion gap: 9 (ref 5–15)
BUN: 6 mg/dL — ABNORMAL LOW (ref 8–23)
CO2: 25 mmol/L (ref 22–32)
Calcium: 9.9 mg/dL (ref 8.9–10.3)
Chloride: 102 mmol/L (ref 98–111)
Creatinine, Ser: 0.92 mg/dL (ref 0.44–1.00)
GFR calc Af Amer: >60 mL/min (ref >60)
GFR calc non Af Amer: >60 mL/min (ref >60)
Glucose, Bld: 91 mg/dL (ref 70–99)
Potassium: 4.7 mmol/L (ref 3.5–5.1)
Sodium: 136 mmol/L (ref 135–145)

## 2020-02-20 NOTE — Progress Notes (Signed)
Primary Care Physician: Hulan Fess, MD Referring Physician: Device  Clinic EP: [place]  Cardiologist: Dr.Turner    Meagan Ryan is a 64 y.o. female with a h/o DCM 2/2 treatment of breast cancer in 1996, HTN, Covid in June of 2020, associated with bradycardia and  HB, receiving PPM. She  appears to have symptoms of long hauler syndrome with continued weakness/ shortness of breath. She had a chest CT today ordered by Dr. Lamonte Sakai to further evaluate these symptoms.   I am seeing her today as afib was discovered on her device. She  had 6 hours of afib on March 6th, but a few short episodes were noted before that. Interesting enough, she is allergic to Tide detergent and her daughter had washed her clothes for her and  used Tide . She broke out in whelps and itching. She took a shower and got extremely weak and her family had to help her out of the shower. She laid down, took  benadryl and felt better later in the day. Her BP is elevated today on presentation 170/98, on recheck 150/90. She  has noted increase in BP over the last few weeks. She states that she has had sleep studies in the past with mild obstruction, did not recommend CPAP. CHA2DS2VASc score is 4( age/female/CM/ HTN)   F/u in afib clinic 4/14. Pt has now been on Doac x one month. SH states that she started eliquis the same day as she had the covid shot and had a rash. She called the office and was told to take benadryl. The rash cleared within the hour but since then she will have a random rash that starts in her face then to her neck and will spread thru the arms and legs. It will often clear on its own after an hour. I doubt this is a drug rash.  She  states that she stayed in bed for several days after the covid shot. She is due her second shot 4/28. Her BP is elevated today. She states since covid it runs higher but she has not checked that often at home.  Today, she denies symptoms of palpitations, chest pain, shortness of breath,  orthopnea, PND, lower extremity edema, dizziness, presyncope, syncope, or neurologic sequela. The patient is tolerating medications without difficulties and is otherwise without complaint today.   Past Medical History:  Diagnosis Date  . Allergic rhinitis   . Asthma   . Breast cancer ([place]) 1996   Left breast-stage II infiltrating ductal carcinoma, 3/8 axillary lymph nodes were positive for metastatic carcinoma, getting yearly mammogram and every other year MRI followed by oncologist Dr Benay Spice  . Complication of anesthesia    WAKE UP WITH HEADACHE, difficulty waking up  . DCM (dilated cardiomyopathy) ([place])    EF 55-60% by echo 04/2019  . Dysuria 2011  . Family history of adverse reaction to anesthesia    "family wakes up with personality changes, but are very short term"  . Fatty liver CT 1/12   elevated LFT  . Genetic testing 03/13/2018   Common cancers panel (47 genes) @ [facility] - No pathogenic mutations detected  . H/O fibromyalgia   . H/O osteopenia 2009  . H/O peripheral neuropathy   . H/O varicose veins   . Hyperlipidemia   . Hypertension   . Incomplete RBBB   . 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 OSA (obstructive sleep apnea) 12/31/2015   Very mild with AHI 6/hr but  elevated RDI at 32/hr.  Intolerant to PAP therapy  . Pelvic pain in female   . PMB (postmenopausal bleeding) 2011  . Pregnancy induced hypertension   . Right-sided lacunar infarction ([place])    MRI 1.2012  . Tubular adenoma of colon    Past Surgical History:  Procedure Laterality Date  . Gastroc muscle repair  2007  . LAPAROSCOPIC CHOLECYSTECTOMY    . MASTECTOMY  06/1995   Left - for infiltrating ductal carcinoma  . MASTECTOMY W/ SENTINEL NODE BIOPSY Right 03/31/2018  . MASTECTOMY W/ SENTINEL NODE BIOPSY Right 03/31/2018   Procedure: RIGHT TOTAL MASTECTOMY WITH AXILLARY SENTINEL LYMPH NODE BIOPSY ERAS PATHWAY;  Surgeon: Excell Seltzer, MD;  Location: [facility];  Service: General;  Laterality: Right;  .  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 PACEMAKER IMPLANT N/A 04/16/2019   Procedure: PACEMAKER IMPLANT;  Surgeon: Constance Haw, MD;  Location: [facility];  Service: Cardiovascular;  Laterality: N/A;  . ROBOTIC ASSISTED TOTAL HYSTERECTOMY WITH BILATERAL SALPINGO OOPHERECTOMY Bilateral 10/09/2015   Procedure: ROBOTIC ASSISTED BILATERAL SALPINGO OOPHORECTOMY ;  Surgeon: Everitt Amber, MD;  Location: [facility];  Service: Gynecology;  Laterality: Bilateral;  . Tram surgery  1996  . TUBAL LIGATION      Current Outpatient Medications  Medication Sig Dispense Refill  . ALPRAZolam (XANAX) 0.5 MG tablet Take 0.5 mg by mouth 2 (two) times daily.     Marland Kitchen anastrozole (ARIMIDEX) 1 MG tablet Take 1 tablet (1 mg total) by mouth daily. 90 tablet 3  . apixaban (ELIQUIS) 5 MG TABS tablet Take 1 tablet (5 mg total) by mouth 2 (two) times daily. 60 tablet 3  . carvedilol (COREG) 12.5 MG tablet Take 1 tablet (12.5 mg total) by mouth 2 (two) times daily with a meal. 60 tablet 3  . cetirizine (ZYRTEC) 10 MG tablet Take 10 mg by mouth every morning.     . DULoxetine (CYMBALTA) 30 MG capsule Take 30 mg by mouth at bedtime.     . fluticasone (FLONASE) 50 MCG/ACT nasal spray Place 1 spray into both nostrils every morning.     . gabapentin (NEURONTIN) 300 MG capsule Take 300 mg by mouth 2 (two) times daily.    . ivabradine (CORLANOR) 5 MG TABS tablet Take 1 tablet (5 mg total) by mouth 2 (two) times daily with a meal. 180 tablet 3  . niacin 500 MG tablet Take 500 mg by mouth daily.    . Omega-3 Fatty Acids (FISH OIL) 1000 MG CAPS Take 2,000 mg by mouth 2 (two) times daily.    Marland Kitchen omeprazole (PRILOSEC) 20 MG capsule Take 20 mg by mouth daily.    . potassium chloride (K-DUR) 10 MEQ tablet Take 10 mEq by mouth daily.    . sacubitril-valsartan (ENTRESTO) 24-26 MG Take 1 tablet by mouth 2 (two) times daily. 180 tablet 3  . VITAMIN D PO Take 4,000 Units by mouth in the morning and at bedtime.      No  current facility-administered medications for this encounter.    Allergies  Allergen Reactions  . Amlodipine Shortness Of Breath  . Noroxin [Norfloxacin] Shortness Of Breath  . Cephalexin Other (See Comments) and Nausea And Vomiting    Fever got to 105 High temperature  . Chocolate     Migraines   . Codeine Nausea And Vomiting  . Sulfa Antibiotics Nausea And Vomiting  . Latex Itching and Rash    redness  . Penicillins Rash  Has patient had a PCN reaction causing immediate rash, facial/tongue/throat swelling, SOB or lightheadedness with hypotension:No Has patient had a PCN reaction causing severe rash involving mucus membranes or skin necrosis: Yes Has patient had a PCN reaction that required hospitalization No Has patient had a PCN reaction occurring within the last 10 years: No If all of the above answers are "NO", then may proceed with Cephalosporin use.     Social History   Socioeconomic History  . Marital status: Widowed    Spouse name: Not on file  . Number of children: Not on file  . Years of education: Not on file  . Highest education level: Not on file  Occupational History  . Not on file  Tobacco Use  . Smoking status: Never Smoker  . Smokeless tobacco: Never Used  Substance and Sexual Activity  . Alcohol use: No  . Drug use: No  . Sexual activity: Not on file  Other Topics Concern  . 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 Difficulty of Paying Living Expenses:   Food Insecurity:   . Worried About Charity fundraiser in the Last Year:   . Arboriculturist in the Last Year:   Transportation Needs:   . Film/video editor (Medical):   Marland Kitchen  Lack of Transportation (Non-Medical):   Physical Activity:   . Days of Exercise per Week:   . Minutes of Exercise per Session:   Stress:   . Feeling of Stress :   Social Connections:   . Frequency of Communication with Friends and Family:   . Frequency of Social Gatherings with Friends and Family:   . Attends Religious Services:   . Active Member of Clubs or Organizations:   . Attends Archivist Meetings:   Marland Kitchen Marital Status:   Intimate Partner Violence:   . Fear of Current or Ex-Partner:   . Emotionally Abused:   Marland Kitchen Physically Abused:   . Sexually Abused:     Family History  Problem Relation Age of Onset  . Heart disease Mother        MI  . Stroke Mother   . Diabetes Mother   . Hypertension Mother   . Stroke Father   . Prostate cancer Father 5       deceased 66  . Colon cancer Sister 34       currently 24  . Breast cancer Paternal Aunt 91       deceased 71s  . Breast cancer Cousin        daughter of pat aunt w/ breast ca  . Breast cancer Cousin        daughter of pat aunt w/ breast ca    ROS- All systems are reviewed and negative except as per the HPI above  Physical Exam: Vitals:   02/20/20 1443  BP: (!) 160/110  Pulse: 60  Weight: 108.4 kg  Height: 5\' 4"  (1.626 m)   Wt Readings from Last 3 Encounters:  02/20/20 108.4 kg  01/30/20 108.4 kg  01/14/20 124.3 kg    Labs: Lab Results  Component Value Date  NA 134 01/18/2020   K 4.3 01/18/2020   CL 97 01/18/2020   CO2 21 01/18/2020   GLUCOSE 82 01/18/2020   BUN 7 (L) 01/18/2020   CREATININE 1.06 (H) 01/18/2020   CALCIUM 9.8 01/18/2020   No results found for: INR Lab Results  Component Value Date   TRIG 110 04/15/2019     GEN- The patient is well appearing, alert and oriented x 3 today.   Head- normocephalic, atraumatic Eyes-  Sclera clear, conjunctiva pink Ears- hearing intact Oropharynx- clear Neck- supple, no JVP Lymph- no cervical lymphadenopathy Lungs- Clear to ausculation  bilaterally, normal work of breathing Heart- Regular rate and rhythm, no murmurs, rubs or gallops, PMI not laterally displaced GI- soft, NT, ND, + BS Extremities- no clubbing, cyanosis, or edema MS- no significant deformity or atrophy Skin- no rash or lesion Psych- euthymic mood, full affect Neuro- strength and sensation are intact  EKG-AV dual paced rhythm at 60 bpm, pr int 160 ms, qrs int 100 ms, qtc 494 ms Echo-1. Left ventricular ejection fraction, by estimation, is 55 to 60%. The  left ventricle has normal function. The left ventricle has no regional  wall motion abnormalities. Left ventricular diastolic parameters are  consistent with Grade II diastolic  dysfunction (pseudonormalization). Elevated left ventricular end-diastolic  pressure.  2. Right ventricular systolic function is normal. The right ventricular  size is mildly enlarged.  3. The mitral valve is abnormal. Trivial mitral valve regurgitation. No  evidence of mitral stenosis.  4. The aortic valve is abnormal. Aortic valve regurgitation is not  visualized. No aortic stenosis is present.   Device report- "Remote transmission received- indicating 3 AF episodes the longest of 6 hours on 3/7.  No documented history of AF, pt not on [place].  Spoke with pt she denies any new symptoms on day of 3/7 episode.  She does report that she has had increased fatigue x1-2 weeks.  Pt confirmed current med list up to date".    Assessment and Plan: 1. New onset afib  Noted on PPM Today is a paced Continue  carvedilol at 12.5 mg bid    2. CHA2DS2VASc score of 4 Continue  eliquis  5 mg bid  Is having an intermittent rash since eliquis/covid shot , but sounds more like hives She will continue to watch  Cbc/bmet  3. PPM  Per Dr. Curt Bears  4. HTN Not controlled today  Pt will make a chart at home and document and will request f/u with Dr. Radford Pax to address   5. NICM Per Dr. Radford Pax  No edema issues   6. Ongoing dyspnea with  Covid in June  Followed by Dr. Lamonte Sakai    afib clinic as needed per device [place]. Tarina Volk, [facility] [address] [place], [place] [address] [phone]

## 2020-03-05 ENCOUNTER — Ambulatory Visit: Payer: Medicare Other

## 2020-03-05 ENCOUNTER — Ambulatory Visit: Payer: Medicare Other | Attending: Internal Medicine

## 2020-03-05 DIAGNOSIS — Z23 Encounter for immunization: Secondary | ICD-10-CM

## 2020-03-05 NOTE — Progress Notes (Signed)
   Covid-19 Vaccination Clinic  Name:  Meagan Ryan    MRN: [id number] DOB: November 12, 1955  03/05/2020  [name] was observed post Covid-19 immunization for 15 minutes without incident. She was provided with Vaccine Information Sheet and instruction to access the V-Safe system.   [name] was instructed to call 911 with any severe reactions post vaccine: Marland Kitchen Difficulty breathing  . Swelling of face and throat  . A fast heartbeat  . A bad rash all over body  . Dizziness and weakness   Immunizations Administered    Name Date Dose VIS Date Route   Moderna COVID-19 Vaccine 03/05/2020  8:14 AM 0.5 mL 10/2019 Intramuscular   Manufacturer: Moderna   Lot: [id number]   [id number]

## 2020-03-19 NOTE — Progress Notes (Signed)
Virtual Visit via Telephone Note   This visit type was conducted due to national recommendations for restrictions regarding the COVID-19 Pandemic (e.g. social distancing) in an effort to limit this patient's exposure and mitigate transmission in our community.  Due to her co-morbid illnesses, this patient is at least at moderate risk for complications without adequate follow up.  This format is felt to be most appropriate for this patient at this time.  The patient did not have access to video technology/had technical difficulties with video requiring transitioning to audio format only (telephone).  All issues noted in this document were discussed and addressed.  No physical exam could be performed with this format.  Please refer to the patient's chart for her  consent to telehealth for [facility].  Evaluation Performed:  Follow-up visit  This visit type was conducted due to national recommendations for restrictions regarding the COVID-19 Pandemic (e.g. social distancing).  This format is felt to be most appropriate for this patient at this time.  All issues noted in this document were discussed and addressed.  No physical exam was performed (except for noted visual exam findings with Video Visits).  Please refer to the patient's chart (MyChart message for video visits and phone note for telephone visits) for the patient's consent to telehealth for [facility].  Date:  03/20/2020   ID:  Meagan Ryan, DOB 02-Oct-1956, MRN [id number]  Patient Location:  Home  Provider location:   [place]  PCP:  Hulan Fess, MD  Cardiologist:  Fransico Him, MD Sleep Medicine:  Fransico Him MD Electrophysiologist:  Will Meredith Leeds, MD   Chief Complaint:  HTN  History of Present Illness:    Meagan Ryan is a 64 y.o. female who presents via audio/video conferencing for a telehealth visit today.    Meagan C Hunteris a 63y.o.femalewith a history of DCM secondary to chemotherapy and HTN.She  has h/o breast cancer with recurrence. Her cardiomyopathy wasdiagnosed in October 2016. Echo at that time showed reduced EF down to 30 to 35%. Subsequently,she underwent further ischemic evaluation with a coronary CTA with morphology done on September 04, 2015. This showed a coronary calcium score of 0. Normal coronary origin with right dominance. There was no evidence of CAD, making the diagnosis of nonischemic cardiomyopathy. She was placed on medical therapy. She has been on a BB and Entresto along with Corlanor. Shehad a repeat echocardiogram December 14, 2017 which showed improvement in EF to 50 to 55%. Grade 2 diastolic dysfunction was also noted. Her valvularfunction was normal.  She was admitted 04/2019 with COVID 19 complicated by symptomatic bradycardia with second degree Type 2 HB and intermittent CHB.  Carvedilol was stopped.  She underwent PPM by Dr. Curt Bears.  She was seen back 11/202 and was doing well except for fatigue and lack of energy since COVID.  It was felt sx were due to deconditioning and residual effects from COVID.  Her Carvedilol was restarted at 6.25mg  BID due to HTN.   I saw her 12/2019 and she was still complaining of fatigue.  Dr. Curt Bears adjusted her PPM settings but this did not help.  She thought the ongoing fatigue was due to COVID.  She also had a lingering cough at night and DOE since her COVID infection even with doing housework.  A 2D echo was normal and she was referred to Dr. Lamonte Sakai with Pulmonary who felt her sx were post-COVID related.  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  She was recently seen by Doristine Devoid, NP in afib clinic and her BP was elevated and she recommended that she follow back up with me. She is here today and doing well.  She says that her SOB has significantly improved since I saw her last and her fatigue is better.   She tells me that at home her BP is fine and it only goes up in the doctors office.  She denies any chest pain or pressure, palpitations, dizziness, syncope or LE edema.   The patient does not have symptoms concerning for COVID-19 infection (fever, chills, cough, or new shortness of breath).    Prior CV studies:   The following studies were reviewed today:  2d echo, high res chest CT  Past Medical History:  Diagnosis Date  . Allergic rhinitis   . Asthma   . Breast cancer ([place]) 1996   Left breast-stage II infiltrating ductal carcinoma, 3/8 axillary lymph nodes were positive for metastatic carcinoma, getting yearly mammogram and every other year MRI followed by oncologist Dr Benay Spice  . Complication of anesthesia    WAKE UP WITH HEADACHE, difficulty waking up  . DCM (dilated cardiomyopathy) ([place])    EF 55-60% by echo 04/2019  . Dysuria 2011  . Family history of adverse reaction to anesthesia    "family wakes up with personality changes, but are very short term"  . Fatty liver CT 1/12   elevated LFT  . Genetic testing 03/13/2018   Common cancers panel (47 genes) @ [facility] - No pathogenic mutations detected  . H/O fibromyalgia   . H/O osteopenia 2009  . H/O peripheral neuropathy   . H/O varicose veins   . Hyperlipidemia   . Hypertension   . Incomplete RBBB   . 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 OSA (obstructive sleep apnea) 12/31/2015   Very mild with AHI 6/hr but elevated RDI at 32/hr.  Intolerant to PAP therapy  . Pelvic pain in female   . PMB (postmenopausal bleeding) 2011  . Pregnancy induced hypertension   . Right-sided lacunar infarction ([place])    MRI 1.2012  . Tubular adenoma of colon    Past Surgical History:  Procedure Laterality Date  . Gastroc muscle repair  2007  . LAPAROSCOPIC CHOLECYSTECTOMY    . MASTECTOMY  06/1995   Left - for infiltrating ductal carcinoma  . MASTECTOMY W/ SENTINEL NODE BIOPSY Right 03/31/2018  . MASTECTOMY W/ SENTINEL NODE BIOPSY  Right 03/31/2018   Procedure: RIGHT TOTAL MASTECTOMY WITH AXILLARY SENTINEL LYMPH NODE BIOPSY ERAS PATHWAY;  Surgeon: Excell Seltzer, MD;  Location: [facility];  Service: General;  Laterality: Right;  . 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 PACEMAKER IMPLANT N/A 04/16/2019   Procedure: PACEMAKER IMPLANT;  Surgeon: Constance Haw, MD;  Location: [facility];  Service: Cardiovascular;  Laterality: N/A;  . ROBOTIC ASSISTED TOTAL HYSTERECTOMY WITH BILATERAL SALPINGO OOPHERECTOMY Bilateral 10/09/2015   Procedure: ROBOTIC ASSISTED BILATERAL SALPINGO OOPHORECTOMY ;  Surgeon: Everitt Amber, MD;  [facility];  Service: Gynecology;  Laterality: Bilateral;  . Tram surgery  1996  . TUBAL LIGATION       Current Meds  Medication Sig  . ALPRAZolam (XANAX) 0.5 MG tablet Take 0.5 mg by mouth 2 (two) times daily.   Marland Kitchen anastrozole (ARIMIDEX) 1 MG tablet Take 1 tablet (1 mg total) by mouth daily.  Marland Kitchen  apixaban (ELIQUIS) 5 MG TABS tablet Take 1 tablet (5 mg total) by mouth 2 (two) times daily.  . carvedilol (COREG) 12.5 MG tablet Take 1 tablet (12.5 mg total) by mouth 2 (two) times daily with a meal.  . cetirizine (ZYRTEC) 10 MG tablet Take 10 mg by mouth every morning.   . DULoxetine (CYMBALTA) 30 MG capsule Take 30 mg by mouth at bedtime.   . fluticasone (FLONASE) 50 MCG/ACT nasal spray Place 1 spray into both nostrils every morning.   . gabapentin (NEURONTIN) 300 MG capsule Take 300 mg by mouth 2 (two) times daily.  . ivabradine (CORLANOR) 5 MG TABS tablet Take 1 tablet (5 mg total) by mouth 2 (two) times daily with a meal.  . niacin 500 MG tablet Take 500 mg by mouth daily.  . Omega-3 Fatty Acids (FISH OIL) 1000 MG CAPS Take 2,000 mg by mouth 2 (two) times daily.  Marland Kitchen omeprazole (PRILOSEC) 20 MG capsule Take 20 mg by mouth daily.  . potassium chloride (K-DUR) 10 MEQ tablet Take 10 mEq by mouth daily.  . sacubitril-valsartan (ENTRESTO) 24-26 MG Take 1 tablet by mouth 2 (two) times  daily.  Marland Kitchen VITAMIN D PO Take 4,000 Units by mouth in the morning and at bedtime.      Allergies:   Amlodipine, Noroxin [norfloxacin], Cephalexin, Chocolate, Codeine, Sulfa antibiotics, Latex, and Penicillins   Social History   Tobacco Use  . Smoking status: Never Smoker  . Smokeless tobacco: Never Used  Substance Use Topics  . Alcohol use: No  . Drug use: No     Family Hx: The patient's family history includes Breast cancer in her cousin and cousin; Breast cancer (age of onset: 32) in her paternal aunt; Colon cancer (age of onset: 44) in her sister; Diabetes in her mother; Heart disease in her mother; Hypertension in her mother; Prostate cancer (age of onset: 39) in her father; Stroke in her father and mother.  ROS:   Please see the history of present illness.     All other systems reviewed and are negative.   Labs/Other Tests and Data Reviewed:    Recent Labs: 04/15/2019: B Natriuretic Peptide 142.9 06/14/2019: ALT 21 01/18/2020: NT-Pro BNP 110 02/20/2020: BUN 6; Creatinine, Ser 0.92; Hemoglobin 13.1; Platelets 253; Potassium 4.7; Sodium 136   Recent Lipid Panel Lab Results  Component Value Date/Time   TRIG 110 04/15/2019 10:42 AM    Wt Readings from Last 3 Encounters:  03/20/20 242 lb (109.8 kg)  02/20/20 239 lb (108.4 kg)  01/30/20 239 lb (108.4 kg)     Objective:    Vital Signs:  BP 128/78   Pulse 72   Ht 5\' 4"  (1.626 m)   Wt 242 lb (109.8 kg)   BMI 41.54 kg/m     ASSESSMENT & PLAN:    1.  DCM -felt secondary to chemo -initial EF 30-35% but normalized to 55-60% on echo 04/2019 -repeat 2D echo 12/2019 showed normal LVF with EF 55-60% and G1DD  -She is NYHA class 2a -continue Corlanor 5mg  BID, Entresto 24-26mg  BID and Carvedilol 12.5mg  BID -Creatinine 0.96 09/2019  2  HTN -BP controlled on exam today - I suspect that she has white coat HTN -continue Carvedilol 12.5mg  BID, Entresto 24-26mg  BID -creatinine 0.92 last month -I have asked her to check her BP  at lunch daily for a week and call with results  3.  Chronic diastolic CHF -she denies any  LE edema -she has not required any diuretics -  continue Carvedilol  4.   DOE -this started after her COVID infection in June -HR Chest CT with post COVID changes -SOB has improved since I saw her last and pulmonary felt due to COVID and obesity -repeat 2D echo to make sure LVF has remained normal  5.  Morbid Obesity -her fatigue post COVID has improved so I have encouraged her to walk -encouraged her to cut back on Carbs and sugar  6.  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 Curt Bears  7. PAF -denies any palpitations -continue Carvedilol 12.5mg  BID and Eliquis 5mg  BID -followed in afib clinic  8.  OSA -was mild in the past and intolerant to treatment -given her fatigue, HTN and PAF will get a home sleep study  Patient Risk:   After full review of this patient's clinical status, I feel that they are at least moderate risk at this time.  Time:   Today, I have spent 20 minutes on telemedicine discussing medical problems including SOB, CHF, obesity, HTN, DCM and reviewing patient's chart including HR chest CT, 2D echo.  Medication Adjustments/Labs and Tests Ordered: Current medicines are reviewed at length with the patient today.  Concerns regarding medicines are outlined above.  Tests Ordered: Orders Placed This Encounter  Procedures  . Home sleep test   Medication Changes: No orders of the defined types were placed in this encounter.   Disposition:  Follow up in 6 month(s)  Signed, Fransico Him, MD  03/20/2020 10:43 AM    [facility]

## 2020-03-20 ENCOUNTER — Encounter: Payer: Self-pay | Admitting: Cardiology

## 2020-03-20 ENCOUNTER — Ambulatory Visit: Payer: Medicare Other | Admitting: Cardiology

## 2020-03-20 ENCOUNTER — Telehealth (INDEPENDENT_AMBULATORY_CARE_PROVIDER_SITE_OTHER): Payer: Medicare Other | Admitting: Cardiology

## 2020-03-20 ENCOUNTER — Other Ambulatory Visit: Payer: Self-pay

## 2020-03-20 VITALS — BP 128/78 | HR 72 | Ht 64.0 in | Wt 242.0 lb

## 2020-03-20 DIAGNOSIS — I10 Essential (primary) hypertension: Secondary | ICD-10-CM | POA: Diagnosis not present

## 2020-03-20 DIAGNOSIS — R0600 Dyspnea, unspecified: Secondary | ICD-10-CM | POA: Diagnosis not present

## 2020-03-20 DIAGNOSIS — R0609 Other forms of dyspnea: Secondary | ICD-10-CM

## 2020-03-20 DIAGNOSIS — I480 Paroxysmal atrial fibrillation: Secondary | ICD-10-CM

## 2020-03-20 DIAGNOSIS — G4733 Obstructive sleep apnea (adult) (pediatric): Secondary | ICD-10-CM

## 2020-03-20 DIAGNOSIS — I5022 Chronic systolic (congestive) heart failure: Secondary | ICD-10-CM

## 2020-03-20 DIAGNOSIS — I420 Dilated cardiomyopathy: Secondary | ICD-10-CM | POA: Diagnosis not present

## 2020-03-20 DIAGNOSIS — I441 Atrioventricular block, second degree: Secondary | ICD-10-CM

## 2020-03-20 DIAGNOSIS — R0683 Snoring: Secondary | ICD-10-CM

## 2020-03-20 NOTE — Patient Instructions (Addendum)
Medication Instructions:  Your physician recommends that you continue on your current medications as directed. Please refer to the Current Medication list given to you today.  *If you need a refill on your cardiac medications before your next appointment, please call your pharmacy*   Lab Work: None ordered  If you have labs (blood work) drawn today and your tests are completely normal, you will receive your results only by: Marland Kitchen MyChart Message (if you have MyChart) OR . A paper copy in the mail If you have any lab test that is abnormal or we need to change your treatment, we will call you to review the results.   Testing/Procedures: Your physician has recommended that you have a home sleep study. This test records several body functions during sleep, including: brain activity, eye movement, oxygen and carbon dioxide blood levels, heart rate and rhythm, breathing rate and rhythm, the flow of air through your mouth and nose, snoring, body muscle movements, and chest and belly movement.   Follow-Up: At [facility], you and your health needs are our priority.  As part of our continuing mission to provide you with exceptional heart care, we have created designated Provider Care Teams.  These Care Teams include your primary Cardiologist (physician) and Advanced Practice Providers (APPs -  Physician Assistants and Nurse Practitioners) who all work together to provide you with the care you need, when you need it.  We recommend signing up for the patient portal called "MyChart".  Sign up information is provided on this After Visit Summary.  MyChart is used to connect with patients for Virtual Visits (Telemedicine).  Patients are able to view lab/test results, encounter notes, upcoming appointments, etc.  Non-urgent messages can be sent to your provider as well.   To learn more about what you can do with MyChart, go to [email or web address].    Your next appointment:   6 month(s)  The format for  your next appointment:   In Person  Provider:   Fransico Him, MD   Other Instructions  Monitor your Blood Pressure daily for 1 week and call to report readings.

## 2020-03-25 ENCOUNTER — Other Ambulatory Visit: Payer: Self-pay | Admitting: *Deleted

## 2020-03-25 ENCOUNTER — Telehealth: Payer: Self-pay | Admitting: *Deleted

## 2020-03-25 MED ORDER — APIXABAN 5 MG PO TABS: 5.0000 mg | ORAL_TABLET | Freq: Two times a day (BID) | ORAL | 1 refills | Status: DC

## 2020-03-25 NOTE — Telephone Encounter (Signed)
-----   Message from Cleon Gustin, [place] sent at 03/20/2020  9:47 AM EDT ----- Regarding: Home Sleep Study Per Dr. Radford Pax, patient needs home sleep study for OSA and snoring.   Thanks

## 2020-03-25 NOTE — Telephone Encounter (Signed)
Prescription refill request for Eliquis received.  Last office visit: [place], 03/20/2020 Scr: 0.92, 02/20/2020 Age: 64 y.o. Weight: 109.8 kg   Prescription refill sent.

## 2020-03-26 NOTE — Telephone Encounter (Signed)
Patient is aware and agreeable to Home Sleep Study through [facility]. Patient is scheduled for 05/30/20 at 10 AM to pick up home sleep kit and meet with Respiratory therapist at [facility]. Patient is aware that if this appointment date and time does not work for them they should contact Artis Delay directly at [phone]. Patient is aware that a sleep packet will be sent from [facility] in week. Patient is agreeable to treatment and thankful for call.

## 2020-04-04 ENCOUNTER — Other Ambulatory Visit: Payer: Self-pay | Admitting: Cardiology

## 2020-04-04 MED ORDER — IVABRADINE HCL 5 MG PO TABS: 5.0000 mg | ORAL_TABLET | Freq: Two times a day (BID) | ORAL | 3 refills | Status: DC

## 2020-04-15 ENCOUNTER — Ambulatory Visit (INDEPENDENT_AMBULATORY_CARE_PROVIDER_SITE_OTHER): Payer: Medicare Other | Admitting: *Deleted

## 2020-04-15 ENCOUNTER — Other Ambulatory Visit: Payer: Self-pay | Admitting: Cardiology

## 2020-04-15 DIAGNOSIS — I441 Atrioventricular block, second degree: Secondary | ICD-10-CM | POA: Diagnosis not present

## 2020-04-15 LAB — CUP PACEART REMOTE DEVICE CHECK
Battery Remaining Longevity: 134 mo
Battery Voltage: 3.04 V
Brady Statistic AP VP Percent: 30.21 %
Brady Statistic AP VS Percent: 22.81 %
Brady Statistic AS VP Percent: 31.65 %
Brady Statistic AS VS Percent: 15.33 %
Brady Statistic RA Percent Paced: 52.41 %
Brady Statistic RV Percent Paced: 61.94 %
Date Time Interrogation Session: 20210608004303
Implantable Lead Implant Date: 20200608
Implantable Lead Implant Date: 20200608
Implantable Lead Location: 753859
Implantable Lead Location: 753860
Implantable Lead Model: 5076
Implantable Lead Model: 5076
Implantable Pulse Generator Implant Date: 20200608
Lead Channel Impedance Value: 323 Ohm
Lead Channel Impedance Value: 361 Ohm
Lead Channel Impedance Value: 399 Ohm
Lead Channel Impedance Value: 418 Ohm
Lead Channel Pacing Threshold Amplitude: 0.75 V
Lead Channel Pacing Threshold Amplitude: 0.875 V
Lead Channel Pacing Threshold Pulse Width: 0.4 ms
Lead Channel Pacing Threshold Pulse Width: 0.4 ms
Lead Channel Sensing Intrinsic Amplitude: 1.5 mV
Lead Channel Sensing Intrinsic Amplitude: 1.5 mV
Lead Channel Sensing Intrinsic Amplitude: 16.125 mV
Lead Channel Sensing Intrinsic Amplitude: 16.125 mV
Lead Channel Setting Pacing Amplitude: 1.75 V
Lead Channel Setting Pacing Amplitude: 2.5 V
Lead Channel Setting Pacing Pulse Width: 0.4 ms
Lead Channel Setting Sensing Sensitivity: 2.8 mV

## 2020-04-16 ENCOUNTER — Other Ambulatory Visit: Payer: Self-pay

## 2020-04-16 MED ORDER — ENTRESTO 24-26 MG PO TABS: 1.0000 | ORAL_TABLET | Freq: Two times a day (BID) | ORAL | 3 refills | Status: DC

## 2020-04-16 NOTE — Progress Notes (Signed)
Remote pacemaker transmission.   

## 2020-04-25 ENCOUNTER — Other Ambulatory Visit: Payer: Self-pay

## 2020-04-25 DIAGNOSIS — Z170 Estrogen receptor positive status [ER+]: Secondary | ICD-10-CM

## 2020-04-25 DIAGNOSIS — C50411 Malignant neoplasm of upper-outer quadrant of right female breast: Secondary | ICD-10-CM

## 2020-04-25 MED ORDER — ANASTROZOLE 1 MG PO TABS: 1.0000 mg | ORAL_TABLET | Freq: Every day | ORAL | 3 refills | Status: DC

## 2020-05-26 ENCOUNTER — Other Ambulatory Visit: Payer: Self-pay | Admitting: Family Medicine

## 2020-05-26 DIAGNOSIS — M8580 Other specified disorders of bone density and structure, unspecified site: Secondary | ICD-10-CM

## 2020-05-30 ENCOUNTER — Encounter (HOSPITAL_BASED_OUTPATIENT_CLINIC_OR_DEPARTMENT_OTHER): Payer: Medicare Other | Admitting: Cardiology

## 2020-06-05 ENCOUNTER — Ambulatory Visit (INDEPENDENT_AMBULATORY_CARE_PROVIDER_SITE_OTHER): Payer: Medicare Other | Admitting: Orthopedic Surgery

## 2020-06-05 ENCOUNTER — Encounter: Payer: Self-pay | Admitting: Orthopedic Surgery

## 2020-06-05 ENCOUNTER — Ambulatory Visit (INDEPENDENT_AMBULATORY_CARE_PROVIDER_SITE_OTHER): Payer: Medicare Other

## 2020-06-05 VITALS — Ht 64.0 in | Wt 242.0 lb

## 2020-06-05 DIAGNOSIS — G8929 Other chronic pain: Secondary | ICD-10-CM | POA: Diagnosis not present

## 2020-06-05 DIAGNOSIS — M79604 Pain in right leg: Secondary | ICD-10-CM

## 2020-06-05 DIAGNOSIS — M5441 Lumbago with sciatica, right side: Secondary | ICD-10-CM | POA: Diagnosis not present

## 2020-06-05 MED ORDER — PREDNISONE 10 MG PO TABS
20.0000 mg | ORAL_TABLET | Freq: Every day | ORAL | 0 refills | Status: DC
Start: 2020-06-05 — End: 2020-08-21

## 2020-06-08 ENCOUNTER — Encounter: Payer: Self-pay | Admitting: Orthopedic Surgery

## 2020-06-08 NOTE — Progress Notes (Signed)
Office Visit Note   Patient: Meagan Ryan           Date of Birth: Mar 11, 1956           MRN: [id number] Visit Date: 06/05/2020              Requested by: Hulan Fess, MD [place],  [place] [address] PCP: Hulan Fess, MD  Chief Complaint  Patient presents with  . Right Leg - Pain, Weakness      HPI: Patient is a 64 year old woman who presents complaining of lower back pain with shooting pain that radiates down the right leg.  She states the pain is transversely across her lumbar spine and radiates into the anterior aspect of the right thigh and anterior aspect of the right leg.  Patient states that she has to manually pick up her leg to get in and out of the car.  Patient states she does have heart problems she is seeing Dr. Radford Pax she states she has shortness of breath with walking and does have a pacemaker.  Assessment & Plan: Visit Diagnoses:  1. Pain in right leg   2. Chronic bilateral low back pain with right-sided sciatica     Plan: We will start her on 20 mg of prednisone with breakfast she will wean off as her symptoms improve at reevaluation discussed that we may need to consider a CT myelogram for the possibility of epidural steroid injections.  Follow-Up Instructions: Return in about 3 weeks (around 06/26/2020).   Ortho Exam  Patient is alert, oriented, no adenopathy, well-dressed, normal affect, normal respiratory effort. Examination patient is unsteady with her balance getting from a sitting to a standing position.  She has a negative straight leg raise bilaterally.  Hip flexor strength is symmetric bilaterally motor strength in both lower extremities are symmetric.  Imaging: No results found. No images are attached to the encounter.  Labs: Lab Results  Component Value Date   CRP 4.7 (H) 04/15/2019   LABURIC 5.5 05/08/2018   REPTSTATUS 04/20/2019 FINAL 04/15/2019   CULT  04/15/2019    NO GROWTH 5 DAYS Performed at [facility], [place] [address], [place], [place] [address]      Lab Results  Component Value Date   ALBUMIN 3.4 (L) 06/14/2019   ALBUMIN 2.8 (L) 04/17/2019   ALBUMIN 2.9 (L) 04/16/2019   LABURIC 5.5 05/08/2018    No results found for: MG No results found for: VD25OH  No results found for: PREALBUMIN CBC EXTENDED Latest Ref Rng & Units 02/20/2020 09/27/2019 06/14/2019  WBC 4.0 - 10.5 K/uL 5.0 4.9 5.4  RBC 3.87 - 5.11 MIL/uL 5.03 5.19 4.35  HGB 12.0 - 15.0 g/dL 13.1 13.8 12.1  HCT 36 - 46 % 42.2 42.1 38.1  PLT 150 - 400 K/uL 253 287 250  NEUTROABS 1.7 - 7.7 K/uL - - 2.5  LYMPHSABS 0.7 - 4.0 K/uL - - 2.0     Body mass index is 41.54 kg/m.  Orders:  Orders Placed This Encounter  Procedures  . XR Lumbar Spine 2-3 Views   Meds ordered this encounter  Medications  . predniSONE (DELTASONE) 10 MG tablet    Sig: Take 2 tablets (20 mg total) by mouth daily with breakfast.    Dispense:  60 tablet    Refill:  0     Procedures: No procedures performed  Clinical Data: No additional findings.  ROS:  All other systems negative, except as noted in  the HPI. Review of Systems  Objective: Vital Signs: Ht 5\' 4"  (1.626 m)   Wt (!) 242 lb (109.8 kg)   BMI 41.54 kg/m   Specialty Comments:  No specialty comments available.  PMFS History: Patient Active Problem List   Diagnosis Date Noted  . Dyspnea 01/14/2020  . Symptomatic advanced heart block 04/15/2019  . COVID-19 virus infection 04/15/2019  . History of left breast cancer 05/01/2018  . Genetic testing 03/13/2018  . Family history of breast cancer   . Malignant neoplasm of upper-outer quadrant of right breast in female, estrogen receptor positive ([place]) 02/23/2018  . Syncope 12/14/2017  . Pain and swelling of right lower leg 10/28/2016  . Right leg numbness 10/28/2016  . Chronic systolic CHF (congestive heart failure), NYHA class 2 ([place]) 02/26/2016  . OSA (obstructive sleep apnea) 12/31/2015  . History of breast cancer in female  10/30/2015  . Ovarian cyst 10/10/2015  . Bilateral ovarian cysts 10/09/2015  . S/P oophorectomy 10/09/2015  . Morbid obesity with BMI of 40.0-44.9, adult ([place]) 08/08/2015  . Benign essential HTN 09/18/2014  . RBBB 09/18/2014  . Incomplete RBBB   . DCM (dilated cardiomyopathy) ([place]) 04/24/2014  . Meniere disease 06/02/2012   Past Medical History:  Diagnosis Date  . Allergic rhinitis   . Asthma   . Breast cancer ([place]) 1996   Left breast-stage II infiltrating ductal carcinoma, 3/8 axillary lymph nodes were positive for metastatic carcinoma, getting yearly mammogram and every other year MRI followed by oncologist Dr Benay Spice  . Complication of anesthesia    WAKE UP WITH HEADACHE, difficulty waking up  . DCM (dilated cardiomyopathy) ([place])    EF 55-60% by echo 04/2019  . Dysuria 2011  . Family history of adverse reaction to anesthesia    "family wakes up with personality changes, but are very short term"  . Fatty liver CT 1/12   elevated LFT  . Genetic testing 03/13/2018   Common cancers panel (47 genes) @ [facility] - No pathogenic mutations detected  . H/O fibromyalgia   . H/O osteopenia 2009  . H/O peripheral neuropathy   . H/O varicose veins   . Hyperlipidemia   . Hypertension   . Incomplete RBBB   . 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 OSA (obstructive sleep apnea) 12/31/2015   Very mild with AHI 6/hr but elevated RDI at 32/hr.  Intolerant to PAP therapy  . Pelvic pain in female   . PMB (postmenopausal bleeding) 2011  . Pregnancy induced hypertension   . Right-sided lacunar infarction ([place])    MRI 1.2012  . Tubular adenoma of colon     Family History  Problem Relation Age of Onset  . Heart disease Mother        MI  . Stroke Mother   . Diabetes Mother   . Hypertension Mother   . Stroke Father   . Prostate cancer Father 24       deceased 73  . Colon cancer Sister 30       currently 30  . Breast cancer Paternal Aunt 54       deceased 52s  . Breast cancer Cousin         daughter of pat aunt w/ breast ca  . Breast cancer Cousin        daughter of pat aunt w/ breast ca    Past Surgical History:  Procedure Laterality Date  . Gastroc muscle repair  2007  . LAPAROSCOPIC CHOLECYSTECTOMY    .  MASTECTOMY  06/1995   Left - for infiltrating ductal carcinoma  . MASTECTOMY W/ SENTINEL NODE BIOPSY Right 03/31/2018  . MASTECTOMY W/ SENTINEL NODE BIOPSY Right 03/31/2018   Procedure: RIGHT TOTAL MASTECTOMY WITH AXILLARY SENTINEL LYMPH NODE BIOPSY ERAS PATHWAY;  Surgeon: Excell Seltzer, MD;  Location: [facility];  Service: General;  Laterality: Right;  . 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 PACEMAKER IMPLANT N/A 04/16/2019   Procedure: PACEMAKER IMPLANT;  Surgeon: Constance Haw, MD;  Location: [facility];  Service: Cardiovascular;  Laterality: N/A;  . ROBOTIC ASSISTED TOTAL HYSTERECTOMY WITH BILATERAL SALPINGO OOPHERECTOMY Bilateral 10/09/2015   Procedure: ROBOTIC ASSISTED BILATERAL SALPINGO OOPHORECTOMY ;  Surgeon: Everitt Amber, MD;  Location: [facility];  Service: Gynecology;  Laterality: Bilateral;  . Tram surgery  1996  . TUBAL LIGATION     Social History   Occupational History  . Not on file  Tobacco Use  . Smoking status: Never Smoker  . Smokeless tobacco: Never Used  Vaping Use  . Vaping Use: Never used  Substance and Sexual Activity  . Alcohol use: No  . Drug use: No  . Sexual activity: Not on file

## 2020-07-10 ENCOUNTER — Other Ambulatory Visit: Payer: Self-pay

## 2020-07-10 ENCOUNTER — Ambulatory Visit (INDEPENDENT_AMBULATORY_CARE_PROVIDER_SITE_OTHER): Payer: Medicare Other | Admitting: Cardiology

## 2020-07-10 ENCOUNTER — Encounter: Payer: Self-pay | Admitting: Cardiology

## 2020-07-10 DIAGNOSIS — R0600 Dyspnea, unspecified: Secondary | ICD-10-CM | POA: Diagnosis not present

## 2020-07-10 DIAGNOSIS — I480 Paroxysmal atrial fibrillation: Secondary | ICD-10-CM

## 2020-07-10 DIAGNOSIS — I10 Essential (primary) hypertension: Secondary | ICD-10-CM | POA: Diagnosis not present

## 2020-07-10 DIAGNOSIS — R0609 Other forms of dyspnea: Secondary | ICD-10-CM

## 2020-07-10 DIAGNOSIS — R5382 Chronic fatigue, unspecified: Secondary | ICD-10-CM

## 2020-07-10 DIAGNOSIS — G4733 Obstructive sleep apnea (adult) (pediatric): Secondary | ICD-10-CM

## 2020-07-10 DIAGNOSIS — I5032 Chronic diastolic (congestive) heart failure: Secondary | ICD-10-CM

## 2020-07-10 DIAGNOSIS — I420 Dilated cardiomyopathy: Secondary | ICD-10-CM

## 2020-07-10 DIAGNOSIS — I441 Atrioventricular block, second degree: Secondary | ICD-10-CM

## 2020-07-10 LAB — TSH: TSH: 5.44 u[IU]/mL — ABNORMAL HIGH (ref 0.450–4.500)

## 2020-07-10 NOTE — Progress Notes (Signed)
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 Her cardiomyopathy wasdiagnosed in October 2016. Echo at that time showed reduced EF down to 30 to 35%. Subsequently,she underwent further ischemic evaluation with a coronary CTA with morphology done on September 04, 2015. This showed a coronary calcium score of 0. Normal coronary origin with right dominance. There was no evidence of CAD, making the diagnosis of nonischemic cardiomyopathy. She was placed on medical therapy. She has been on a BB and Entresto along with Corlanor. Shehad a repeat echocardiogram December 14, 2017 which showed improvement in EF to 50 to 55%. Grade 2 diastolic dysfunction was also noted. Her valvularfunction was normal.  She was admitted 04/2019 with COVID 19 complicated by symptomatic bradycardia with second degree Type 2 HB and intermittent CHB.  Carvedilol was stopped.  She underwent PPM by Dr. Curt Bears.  She was seen back 11/202 and was doing well except for fatigue and lack of energy since COVID.  It was felt sx were due to deconditioning and residual effects from COVID.  Her Carvedilol was restarted at 6.25mg  BID due to HTN.   I saw her 12/2019 and she was still complaining of fatigue.  Dr. Curt Bears adjusted her PPM settings but this did not help.  She thought the ongoing fatigue was due to COVID.  She also had a lingering cough at night and DOE since her COVID infection even with doing housework.  A 2D echo was normal and she was referred to Dr. Lamonte Sakai with Pulmonary who felt her sx were post-COVID related.  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  She is here today for followup and is doing well.  Her main complaint is that she feels tired all the time.  She does not feel sleepy when she gets up but throughout the day she becomes tired. The fatigue started after having COVID 19 and has not gotten worse just still the same.  2D echo 12/2019 was normal.  She denies any chest pain or pressure,  PND, orthopnea, LE edema, dizziness, palpitations or syncope. She has chronic mild DOE that is stable.  She is compliant with her meds and is tolerating meds with no SE.    Prior CV studies:   The following studies were reviewed today: none  Past Medical History:  Diagnosis Date  . Allergic rhinitis   . Asthma   . Breast cancer ([place]) 1996   Left breast-stage II infiltrating ductal carcinoma, 3/8 axillary lymph nodes were positive for metastatic carcinoma, getting yearly mammogram and every other year MRI followed by oncologist Dr Benay Spice  . Complication of anesthesia    WAKE UP WITH HEADACHE, difficulty waking up  . DCM (dilated cardiomyopathy) ([place])    EF 55-60% by echo 04/2019  . Dysuria 2011  . Family history of adverse reaction to anesthesia    "family wakes up with personality changes, but are very short term"  . Fatty liver CT 1/12   elevated LFT  . Genetic testing 03/13/2018   Common cancers panel (47 genes) @  [facility] - No pathogenic mutations detected  . H/O fibromyalgia   . H/O osteopenia 2009  . H/O peripheral neuropathy   . H/O varicose veins   . Hyperlipidemia   . Hypertension   . Incomplete RBBB   . 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 OSA (obstructive sleep apnea) 12/31/2015   Very mild with AHI 6/hr but elevated RDI at 32/hr.  Intolerant to PAP therapy  . Pelvic pain in female   . PMB (postmenopausal bleeding) 2011  . Pregnancy induced hypertension   . Right-sided lacunar infarction ([place])    MRI 1.2012   . Tubular adenoma of colon    Past Surgical History:  Procedure Laterality Date  . Gastroc muscle repair  2007  . LAPAROSCOPIC CHOLECYSTECTOMY    . MASTECTOMY  06/1995   Left - for infiltrating ductal carcinoma  . MASTECTOMY W/ SENTINEL NODE BIOPSY Right 03/31/2018  . MASTECTOMY W/ SENTINEL NODE BIOPSY Right 03/31/2018   Procedure: RIGHT TOTAL MASTECTOMY WITH AXILLARY SENTINEL LYMPH NODE BIOPSY ERAS PATHWAY;  Surgeon: Excell Seltzer, MD;  Location: [facility];  Service: General;  Laterality: Right;  . 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 PACEMAKER IMPLANT N/A 04/16/2019   Procedure: PACEMAKER IMPLANT;  Surgeon: Constance Haw, MD;  Location: [facility];  Service: Cardiovascular;  Laterality: N/A;  . ROBOTIC ASSISTED TOTAL HYSTERECTOMY WITH BILATERAL SALPINGO OOPHERECTOMY Bilateral 10/09/2015   Procedure: ROBOTIC ASSISTED BILATERAL SALPINGO OOPHORECTOMY ;  Surgeon: Everitt Amber, MD;  [facility]: [facility];  Service: Gynecology;  Laterality: Bilateral;  . Tram surgery  1996  . TUBAL LIGATION       Current Meds  Medication Sig  . ALPRAZolam (XANAX) 0.5 MG tablet Take 0.5 mg by mouth 2 (two) times daily.   Marland Kitchen anastrozole (ARIMIDEX) 1 MG tablet Take 1 tablet (1 mg total) by mouth daily.  Marland Kitchen apixaban (ELIQUIS) 5 MG TABS tablet Take 1 tablet (5 mg total) by mouth 2 (two) times daily.  . carvedilol (COREG) 12.5 MG tablet Take 1 tablet (12.5 mg total) by mouth 2 (two) times daily with a meal.  . cetirizine (ZYRTEC) 10 MG tablet Take 10 mg by mouth every morning.   . DULoxetine (CYMBALTA) 30 MG capsule Take 30 mg by mouth at bedtime.   . fluticasone (FLONASE) 50 MCG/ACT nasal spray Place 1 spray into both nostrils every morning.   . gabapentin (NEURONTIN) 300 MG capsule Take 300 mg by mouth 2 (two) times daily.  . ivabradine (CORLANOR) 5 MG TABS tablet Take 1 tablet (5 mg total) by mouth 2 (two) times daily with a meal.  . niacin 500 MG tablet Take 500 mg by mouth daily.   . Omega-3 Fatty Acids (FISH OIL) 1000 MG CAPS Take 2,000 mg by mouth 2 (two) times daily.  Marland Kitchen omeprazole (PRILOSEC) 20 MG capsule Take 20 mg by mouth daily.  . potassium chloride (K-DUR) 10 MEQ tablet Take 10 mEq by mouth daily.  . predniSONE (DELTASONE) 10 MG tablet Take 2 tablets (20 mg total) by mouth daily with breakfast.  . sacubitril-valsartan (ENTRESTO) 24-26 MG Take 1 tablet by mouth 2 (two) times daily.  Marland Kitchen VITAMIN D PO Take 4,000 Units by mouth in the morning and at bedtime.      Allergies:   Amlodipine, Noroxin [norfloxacin], Cephalexin, Chocolate, Codeine, Sulfa antibiotics, Latex, and Penicillins   Social History   Tobacco Use  . Smoking status: Never Smoker  . Smokeless  tobacco: Never Used  Vaping Use  . Vaping Use: Never used  Substance Use Topics  . Alcohol use: No  . Drug use: No     Family Hx: The patient's family history includes Breast cancer in her cousin and cousin; Breast cancer (age of onset: 75) in her paternal aunt; Colon cancer (age of onset: 39) in her sister; Diabetes in her mother; Heart disease in her mother; Hypertension in her mother; Prostate cancer (age of onset: 74) in her father; Stroke in her father and mother.  ROS:   Please see the history of present illness.     All other systems reviewed and are negative.   Labs/Other Tests and Data Reviewed:    Recent Labs: 01/18/2020: NT-Pro BNP 110 02/20/2020: BUN 6; Creatinine, Ser 0.92; Hemoglobin 13.1; Platelets 253; Potassium 4.7; Sodium 136   Recent Lipid Panel Lab Results  Component Value Date/Time   TRIG 110 04/15/2019 10:42 AM    Wt Readings from Last 3 Encounters:  07/10/20 248 lb 9.6 oz (112.8 kg)  06/05/20 (!) 242 lb (109.8 kg)  03/20/20 242 lb (109.8 kg)     Objective:    Vital Signs:  BP 124/76   Pulse 62   Ht 5\' 4"  (1.626 m)   Wt 248 lb 9.6 oz (112.8 kg)   SpO2 95%   BMI 42.67 kg/m   GEN: Well nourished, well developed in no acute distress HEENT: Normal NECK: No JVD;  No carotid bruits LYMPHATICS: No lymphadenopathy CARDIAC:RRR, no murmurs, rubs, gallops RESPIRATORY:  Clear to auscultation without rales, wheezing or rhonchi  ABDOMEN: Soft, non-tender, non-distended MUSCULOSKELETAL:  No edema; No deformity  SKIN: Warm and dry NEUROLOGIC:  Alert and oriented x 3 PSYCHIATRIC:  Normal affect    ASSESSMENT & PLAN:    1.  DCM -felt secondary to chemo -initial EF 30-35% but normalized to 55-60% on echo 04/2019 -repeat 2D echo 12/2019 showed normal LVF with EF 55-60% and G1DD  -She is NYHA class 2a -continue Corlanor 5mg  BID, Entresto 24-26mg  BID and Carvedilol 12.5mg  BID -SCr was 0.92 in April 2021  2  HTN -BP is controlled on exam today -continue Carvedilol 12.5mg  BID and Entresto 24-26mg  BID  3.  Chronic diastolic CHF -she denies any LE edema and has chronic mild DOE that is stable -she has not required any diuretics -continue BB  4.   DOE -this started after her COVID infection in June -HR Chest CT with post COVID changes -SOB has improved since I saw her last and pulmonary felt due to COVID and obesity -repeat 2D echo showed normal LVF  5.  Morbid Obesity -her fatigue post COVID has improved so I have encouraged her to walk -I have encouraged her to get into a routine exercise program and cut back on carbs and portions.   6.  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 Curt Bears  7. PAF -she had not had any palpitations and denies any bleeding problems.  -Hbg stable at 13.1 in April -continue Carvedilol 12.5mg  BID and Eliquis 5mg  BID -followed in afib clinic  8.  OSA -was mild in the past and intolerant to treatment -given her fatigue, HTN and PAF a home sleep study was recommended but she never got it done because she cannot tolerate the mask and she is not a candidate for Inspire device due to her BMI  9.  Chronic Fatigue -this started after infection with COVID 19 and has not  improved -2D echo showed normal LVF -CBC was normal in April -check TSH  -per PCP   Medication Adjustments/Labs and Tests Ordered: Current medicines are reviewed at length with the patient today.  Concerns regarding medicines are outlined above.  Tests Ordered: No orders of the defined types were placed in this encounter.  Medication Changes: No orders of the defined types were placed in this encounter.   Disposition:  Follow up in 6 month(s)  Signed, Fransico Him, MD  07/10/2020 9:16 AM    [facility]

## 2020-07-10 NOTE — Addendum Note (Signed)
Addended by: Antonieta Iba on: 07/10/2020 09:20 AM   Modules accepted: Orders

## 2020-07-10 NOTE — Patient Instructions (Signed)
Medication Instructions:  Your physician recommends that you continue on your current medications as directed. Please refer to the Current Medication list given to you today.  *If you need a refill on your cardiac medications before your next appointment, please call your pharmacy*   Lab Work: TODAY: TSH If you have labs (blood work) drawn today and your tests are completely normal, you will receive your results only by: Marland Kitchen MyChart Message (if you have MyChart) OR . A paper copy in the mail If you have any lab test that is abnormal or we need to change your treatment, we will call you to review the results.  Follow-Up: At [facility], you and your health needs are our priority.  As part of our continuing mission to provide you with exceptional heart care, we have created designated Provider Care Teams.  These Care Teams include your primary Cardiologist (physician) and Advanced Practice Providers (APPs -  Physician Assistants and Nurse Practitioners) who all work together to provide you with the care you need, when you need it.  Your next appointment:   1 year(s)  The format for your next appointment:   In Person  Provider:   You may see Fransico Him, MD or one of the following Advanced Practice Providers on your designated Care Team:    Melina Copa, PA-C  Ermalinda Barrios, PA-C

## 2020-07-15 ENCOUNTER — Ambulatory Visit (INDEPENDENT_AMBULATORY_CARE_PROVIDER_SITE_OTHER): Payer: Medicare Other | Admitting: *Deleted

## 2020-07-15 DIAGNOSIS — I441 Atrioventricular block, second degree: Secondary | ICD-10-CM | POA: Diagnosis not present

## 2020-07-15 LAB — CUP PACEART REMOTE DEVICE CHECK
Battery Remaining Longevity: 133 mo
Battery Voltage: 3.02 V
Brady Statistic AP VP Percent: 12.74 %
Brady Statistic AP VS Percent: 30.96 %
Brady Statistic AS VP Percent: 24.23 %
Brady Statistic AS VS Percent: 32.07 %
Brady Statistic RA Percent Paced: 43.34 %
Brady Statistic RV Percent Paced: 37.02 %
Date Time Interrogation Session: 20210907062300
Implantable Lead Implant Date: 20200608
Implantable Lead Implant Date: 20200608
Implantable Lead Location: 753859
Implantable Lead Location: 753860
Implantable Lead Model: 5076
Implantable Lead Model: 5076
Implantable Pulse Generator Implant Date: 20200608
Lead Channel Impedance Value: 361 Ohm
Lead Channel Impedance Value: 380 Ohm
Lead Channel Impedance Value: 399 Ohm
Lead Channel Impedance Value: 437 Ohm
Lead Channel Pacing Threshold Amplitude: 0.875 V
Lead Channel Pacing Threshold Amplitude: 0.875 V
Lead Channel Pacing Threshold Pulse Width: 0.4 ms
Lead Channel Pacing Threshold Pulse Width: 0.4 ms
Lead Channel Sensing Intrinsic Amplitude: 12.875 mV
Lead Channel Sensing Intrinsic Amplitude: 12.875 mV
Lead Channel Sensing Intrinsic Amplitude: 3.125 mV
Lead Channel Sensing Intrinsic Amplitude: 3.125 mV
Lead Channel Setting Pacing Amplitude: 1.75 V
Lead Channel Setting Pacing Amplitude: 2.5 V
Lead Channel Setting Pacing Pulse Width: 0.4 ms
Lead Channel Setting Sensing Sensitivity: 2.8 mV

## 2020-07-16 NOTE — Progress Notes (Signed)
Remote pacemaker transmission.   

## 2020-08-19 ENCOUNTER — Other Ambulatory Visit: Payer: Self-pay | Admitting: Physician Assistant

## 2020-08-19 ENCOUNTER — Other Ambulatory Visit: Payer: Self-pay | Admitting: Nurse Practitioner

## 2020-08-21 ENCOUNTER — Telehealth: Payer: Self-pay

## 2020-08-21 ENCOUNTER — Other Ambulatory Visit: Payer: Self-pay

## 2020-08-21 MED ORDER — PREDNISONE 10 MG PO TABS
20.0000 mg | ORAL_TABLET | Freq: Every day | ORAL | 0 refills | Status: DC
Start: 2020-08-21 — End: 2021-07-06

## 2020-08-21 NOTE — Telephone Encounter (Signed)
Patient is requesting a refill of Prednisone for back and leg pain.  The last prescription helped.  She uses [facility] in [place].

## 2020-08-21 NOTE — Telephone Encounter (Signed)
Advised 

## 2020-08-21 NOTE — Telephone Encounter (Signed)
This has been done. Please advise.

## 2020-08-22 ENCOUNTER — Other Ambulatory Visit: Payer: Medicare Other

## 2020-09-05 ENCOUNTER — Other Ambulatory Visit (HOSPITAL_COMMUNITY): Payer: Self-pay

## 2020-09-05 MED ORDER — CARVEDILOL 12.5 MG PO TABS: 12.5000 mg | ORAL_TABLET | Freq: Two times a day (BID) | ORAL | 2 refills | Status: DC

## 2020-10-13 ENCOUNTER — Other Ambulatory Visit: Payer: TRICARE For Life (TFL)

## 2020-10-13 ENCOUNTER — Ambulatory Visit: Payer: TRICARE For Life (TFL) | Admitting: Hematology

## 2020-10-14 ENCOUNTER — Ambulatory Visit (INDEPENDENT_AMBULATORY_CARE_PROVIDER_SITE_OTHER): Payer: Medicare Other

## 2020-10-14 DIAGNOSIS — I441 Atrioventricular block, second degree: Secondary | ICD-10-CM

## 2020-10-14 LAB — CUP PACEART REMOTE DEVICE CHECK
Battery Remaining Longevity: 128 mo
Battery Voltage: 3.02 V
Brady Statistic AP VP Percent: 34.34 %
Brady Statistic AP VS Percent: 8.28 %
Brady Statistic AS VP Percent: 39.92 %
Brady Statistic AS VS Percent: 17.45 %
Brady Statistic RA Percent Paced: 42.37 %
Brady Statistic RV Percent Paced: 74.25 %
Date Time Interrogation Session: 20211207030316
Implantable Lead Implant Date: 20200608
Implantable Lead Implant Date: 20200608
Implantable Lead Location: 753859
Implantable Lead Location: 753860
Implantable Lead Model: 5076
Implantable Lead Model: 5076
Implantable Pulse Generator Implant Date: 20200608
Lead Channel Impedance Value: 342 Ohm
Lead Channel Impedance Value: 361 Ohm
Lead Channel Impedance Value: 380 Ohm
Lead Channel Impedance Value: 418 Ohm
Lead Channel Pacing Threshold Amplitude: 0.875 V
Lead Channel Pacing Threshold Amplitude: 0.875 V
Lead Channel Pacing Threshold Pulse Width: 0.4 ms
Lead Channel Pacing Threshold Pulse Width: 0.4 ms
Lead Channel Sensing Intrinsic Amplitude: 15.375 mV
Lead Channel Sensing Intrinsic Amplitude: 15.375 mV
Lead Channel Sensing Intrinsic Amplitude: 3 mV
Lead Channel Sensing Intrinsic Amplitude: 3 mV
Lead Channel Setting Pacing Amplitude: 1.75 V
Lead Channel Setting Pacing Amplitude: 2.5 V
Lead Channel Setting Pacing Pulse Width: 0.4 ms
Lead Channel Setting Sensing Sensitivity: 2.8 mV

## 2020-10-20 ENCOUNTER — Ambulatory Visit (INDEPENDENT_AMBULATORY_CARE_PROVIDER_SITE_OTHER): Payer: Medicare Other | Admitting: Emergency Medicine

## 2020-10-20 ENCOUNTER — Encounter: Payer: Self-pay | Admitting: Emergency Medicine

## 2020-10-20 ENCOUNTER — Other Ambulatory Visit: Payer: Self-pay

## 2020-10-20 DIAGNOSIS — R0602 Shortness of breath: Secondary | ICD-10-CM

## 2020-10-20 NOTE — Assessment & Plan Note (Signed)
I do not see significant interstitial lung disease, certainly not a pulmonary fibrosis or UIP pattern, on her CT scan of the chest.  There may be some very subtle sequela from her COVID-19 pneumonitis that include some patchy groundglass, some bronchiectatic change.  I tried to reassure her about CT results.  We will repeat the scan to ensure no evolving changes post Covid especially since her dyspnea is worse.  Her biggest complaint is decreased functional capacity, sleepiness, low energy and I question whether some of this is due to sleep disordered breathing.  She agreed to repeat a split-night sleep study and consider retrying CPAP therapy if indicated.  We will obtain this as well as pulmonary function testing.

## 2020-10-20 NOTE — Addendum Note (Signed)
Addended by: Gavin Potters R on: 10/20/2020 04:11 PM   Modules accepted: Orders

## 2020-10-20 NOTE — Progress Notes (Signed)
Subjective:    Patient ID: Meagan Ryan, female    DOB: Nov 30, 1955, 64 y.o.   MRN: [id number]  HPI 64 year old never smoker with a history of breast cancer (1996, 2019), hypertension, FM, OSA (not on CPAP), cardiomyopathy associated with chemotherapy, LVEF 30 to 35% in 2016, improved to 50 to 55% most recently.  She is referred today for weakness, dyspnea, lack of any energy, persistent cough.  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 The pacer has been adjusted without any improvement in ger dyspnea and fatigue. She was discharged on RA.   She has kept cough, happens w paroxysms, often at night but not exclusively. Non-productive. She is on flonase, zyrtec, minimal astelin use. GERD well controlled on omeprazole. Her dyspnea can happen at any time. She has had some increased weakness since starting anastrozole. She has to stop to rest when washing clothes. She has to rest when shopping. She has gained about 45 lbs since last year, possible contributor. No snoring.   ROV 10/20/20 --follow-up visit for 64 year old woman, never smoker with history as outlined above that includes breast cancer, chemotherapy associated cardiomyopathy, hypertension, untreated obstructive sleep apnea, obesity.  She had COVID-19 pneumonia in 02/980 complicated by heart block requiring pacer placement.  She said cough, exertional dyspnea.  Pulmonary function testing was planned but not done.  High-resolution CT scan of the chest 01/30/2020 reviewed by me shows scattered areas of mild peribronchovascular groundglass bilaterally, no subpleural reticulation or traction bronchiectasis, honeycomb change.  No pleural effusion. There was a 2 mm left lower lobe subpleural lymph node.  MDM: Reviewed office notes from Dr. Rex Kras 10/15/2020 Reviewed office note from Dr. Radford Pax 07/10/2020   Review of Systems  Constitutional: Positive for unexpected  weight change. Negative for fever.  HENT: Negative for congestion, dental problem, ear pain, nosebleeds, postnasal drip, rhinorrhea, sinus pressure, sneezing, sore throat and trouble swallowing.   Eyes: Negative for redness and itching.  Respiratory: Positive for cough and shortness of breath. Negative for chest tightness and wheezing.   Cardiovascular: Negative for palpitations and leg swelling.  Gastrointestinal: Negative for nausea and vomiting.  Genitourinary: Negative for dysuria.  Musculoskeletal: Positive for joint swelling.  Skin: Negative for rash.  Allergic/Immunologic: Negative.  Negative for environmental allergies, food allergies and immunocompromised state.  Neurological: Negative for headaches.  Hematological: Does not bruise/bleed easily.  Psychiatric/Behavioral: Negative for dysphoric mood. The patient is nervous/anxious.     Past Medical History:  Diagnosis Date  . Allergic rhinitis   . Asthma   . Breast cancer ([place]) 1996   Left breast-stage II infiltrating ductal carcinoma, 3/8 axillary lymph nodes were positive for metastatic carcinoma, getting yearly mammogram and every other year MRI followed by oncologist Dr Benay Spice  . Complication of anesthesia    WAKE UP WITH HEADACHE, difficulty waking up  . DCM (dilated cardiomyopathy) ([place])    EF 55-60% by echo 04/2019  . Dysuria 2011  . Family history of adverse reaction to anesthesia    "family wakes up with personality changes, but are very short term"  . Fatty liver CT 1/12   elevated LFT  . Genetic testing 03/13/2018   Common cancers panel (47 genes) @ [facility] - No pathogenic mutations detected  . H/O fibromyalgia   . H/O osteopenia 2009  . H/O peripheral neuropathy   . H/O varicose veins   . Hyperlipidemia   . Hypertension   .  Incomplete RBBB   . 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 OSA (obstructive sleep apnea) 12/31/2015   Very mild with AHI 6/hr but elevated RDI at 32/hr.  Intolerant to PAP therapy  .  Pelvic pain in female   . PMB (postmenopausal bleeding) 2011  . Pregnancy induced hypertension   . Right-sided lacunar infarction ([place])    MRI 1.2012  . Tubular adenoma of colon      Family History  Problem Relation Age of Onset  . Heart disease Mother        MI  . Stroke Mother   . Diabetes Mother   . Hypertension Mother   . Stroke Father   . Prostate cancer Father 1       deceased 26  . Colon cancer Sister 69       currently 42  . Breast cancer Paternal Aunt 24       deceased 78s  . Breast cancer Cousin        daughter of pat aunt w/ breast ca  . Breast cancer Cousin        daughter of pat aunt w/ breast ca     Social History   Socioeconomic History  . Marital status: Widowed    Spouse name: Not on file  . Number of children: Not on file  . Years of education: Not on file  . Highest education level: Not on file  Occupational History  . Not on file  Tobacco Use  . Smoking status: Never Smoker  . Smokeless tobacco: Never Used  Vaping Use  . Vaping Use: Never used  Substance and Sexual Activity  . Alcohol use: No  . Drug use: No  . Sexual activity: Not on file  Other Topics Concern  . 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 Wears Ear plugs   Marital Status: Widowed   1 daughter   Social Determinants of Radio broadcast assistant Strain: Not on file  Food Insecurity: Not on file  Transportation Needs: Not on file  Physical Activity: Not on file  Stress: Not on file  Social Connections: Not on file  Intimate Partner Violence: Not on file    Was in the [place] Has lived in [place] Has worked in Academic librarian, no known inhaled exposures from this. May have been exposed to dyes.   Allergies  Allergen Reactions  . Amlodipine Shortness Of Breath  .  Noroxin [Norfloxacin] Shortness Of Breath  . Cephalexin Other (See Comments) and Nausea And Vomiting    Fever got to 105 High temperature  . Chocolate     Migraines   . Codeine Nausea And Vomiting  . Sulfa Antibiotics Nausea And Vomiting  . Latex Itching and Rash    redness  . Penicillins Rash    Has patient had a PCN reaction causing immediate rash, facial/tongue/throat swelling, SOB or lightheadedness with hypotension:No Has patient had a PCN reaction causing severe rash involving mucus membranes or skin necrosis: Yes Has patient had a PCN reaction that required hospitalization No Has patient had a PCN reaction occurring within the last 10 years: No If all of the above answers are "NO", then may proceed with Cephalosporin use.      Outpatient  Medications Prior to Visit  Medication Sig Dispense Refill  . ALPRAZolam (XANAX) 0.5 MG tablet Take 0.5 mg by mouth 2 (two) times daily.    Marland Kitchen anastrozole (ARIMIDEX) 1 MG tablet Take 1 tablet (1 mg total) by mouth daily. 90 tablet 3  . carvedilol (COREG) 12.5 MG tablet Take 1 tablet (12.5 mg total) by mouth 2 (two) times daily with a meal. 60 tablet 2  . cetirizine (ZYRTEC) 10 MG tablet Take 10 mg by mouth every morning.     . DULoxetine (CYMBALTA) 30 MG capsule Take 30 mg by mouth at bedtime.     Marland Kitchen ELIQUIS 5 MG TABS tablet TAKE 1 TABLET TWICE A DAY 180 tablet 3  . fluticasone (FLONASE) 50 MCG/ACT nasal spray Place 1 spray into both nostrils every morning.     . gabapentin (NEURONTIN) 300 MG capsule Take 300 mg by mouth 2 (two) times daily.    . ivabradine (CORLANOR) 5 MG TABS tablet Take 1 tablet (5 mg total) by mouth 2 (two) times daily with a meal. 180 tablet 3  . niacin 500 MG tablet Take 500 mg by mouth daily.    . Omega-3 Fatty Acids (FISH OIL) 1000 MG CAPS Take 2,000 mg by mouth 2 (two) times daily.    Marland Kitchen omeprazole (PRILOSEC) 20 MG capsule Take 20 mg by mouth daily.    . potassium chloride (K-DUR) 10 MEQ tablet Take 10 mEq by mouth  daily.    . predniSONE (DELTASONE) 10 MG tablet Take 2 tablets (20 mg total) by mouth daily with breakfast. 60 tablet 0  . sacubitril-valsartan (ENTRESTO) 24-26 MG Take 1 tablet by mouth 2 (two) times daily. 180 tablet 3  . VITAMIN D PO Take 4,000 Units by mouth in the morning and at bedtime.      No facility-administered medications prior to visit.        Objective:   Physical Exam Vitals:   10/20/20 1501  BP: (!) 142/84  Pulse: (!) 59  Temp: (!) 97.3 F (36.3 C)  SpO2: 97%  Weight: 251 lb 12.8 oz (114.2 kg)  Height: 5\' 4"  (1.626 m)   Gen: Pleasant, overwt woman, in no distress,  normal affect  ENT: No lesions,  mouth clear,  oropharynx clear, no postnasal drip  Neck: No JVD, no stridor  Lungs: No use of accessory muscles, distant, no crackles or wheezing on normal respiration, no wheeze on forced expiration  Cardiovascular: RRR, heart sounds normal, no murmur or gallops, no peripheral edema  Musculoskeletal: No deformities, no cyanosis or clubbing  Neuro: alert, awake, non focal  Skin: Warm, no lesions or rash      Assessment & Plan:  Dyspnea I do not see significant interstitial lung disease, certainly not a pulmonary fibrosis or UIP pattern, on her CT scan of the chest.  There may be some very subtle sequela from her COVID-19 pneumonitis that include some patchy groundglass, some bronchiectatic change.  I tried to reassure her about CT results.  We will repeat the scan to ensure no evolving changes post Covid especially since her dyspnea is worse.  Her biggest complaint is decreased functional capacity, sleepiness, low energy and I question whether some of this is due to sleep disordered breathing.  She agreed to repeat a split-night sleep study and consider retrying CPAP therapy if indicated.  We will obtain this as well as pulmonary function testing.  Baltazar Apo, MD, PhD 10/20/2020, 3:27 PM [facility] [phone] or if no  answer  [phone]

## 2020-10-20 NOTE — Patient Instructions (Signed)
There are some very subtle postinflammatory changes on your CT scan of the chest, probably from your COVID-19. This is not in a pattern of overt interstitial lung disease or pulmonary fibrosis.  This is good news.  We will plan to check a repeat CT chest to ensure that the changes are stable and remain minor. We need to perform pulmonary function testing at your next office visit We will perform a split-night sleep study and review the results next time Walking oximetry today on room air Follow with Meagan Ryan next available with full pulmonary function testing on the same day.

## 2020-10-24 NOTE — Progress Notes (Signed)
Remote pacemaker transmission.   

## 2020-10-28 ENCOUNTER — Telehealth: Payer: Self-pay | Admitting: Pulmonary Disease

## 2020-10-28 NOTE — Telephone Encounter (Signed)
Spoke to pt and gave her the phone # to the sleep center to call and rescheduled Joellen [facility]

## 2020-11-04 ENCOUNTER — Other Ambulatory Visit: Payer: Self-pay

## 2020-11-04 ENCOUNTER — Ambulatory Visit (HOSPITAL_COMMUNITY)
Admission: RE | Admit: 2020-11-04 | Discharge: 2020-11-04 | Disposition: A | Discharge status: Home / Self Care | Payer: Medicare Other | Source: Ambulatory Visit | Attending: Emergency Medicine | Admitting: Emergency Medicine

## 2020-11-04 DIAGNOSIS — R0602 Shortness of breath: Secondary | ICD-10-CM | POA: Diagnosis not present

## 2020-12-03 ENCOUNTER — Ambulatory Visit: Payer: Medicare Other | Admitting: Emergency Medicine

## 2020-12-07 ENCOUNTER — Ambulatory Visit (HOSPITAL_BASED_OUTPATIENT_CLINIC_OR_DEPARTMENT_OTHER): Payer: Medicare Other | Attending: Emergency Medicine | Admitting: Pulmonary Disease

## 2020-12-07 ENCOUNTER — Other Ambulatory Visit: Payer: Self-pay

## 2020-12-07 DIAGNOSIS — I480 Paroxysmal atrial fibrillation: Secondary | ICD-10-CM | POA: Insufficient documentation

## 2020-12-07 DIAGNOSIS — Z79899 Other long term (current) drug therapy: Secondary | ICD-10-CM | POA: Diagnosis not present

## 2020-12-07 DIAGNOSIS — R0683 Snoring: Secondary | ICD-10-CM | POA: Insufficient documentation

## 2020-12-07 DIAGNOSIS — R0602 Shortness of breath: Secondary | ICD-10-CM

## 2020-12-08 ENCOUNTER — Other Ambulatory Visit: Payer: Self-pay

## 2020-12-08 MED ORDER — CARVEDILOL 12.5 MG PO TABS: 12.5000 mg | ORAL_TABLET | Freq: Two times a day (BID) | ORAL | 2 refills | Status: DC

## 2020-12-11 DIAGNOSIS — R0602 Shortness of breath: Secondary | ICD-10-CM

## 2020-12-11 DIAGNOSIS — R0683 Snoring: Secondary | ICD-10-CM | POA: Diagnosis not present

## 2020-12-11 NOTE — Procedures (Signed)
    Patient Name: Meagan Ryan, Meagan Ryan Date: 12/07/2020 Gender: Female D.O.B: 05/08/1956 Age (years): 64 Referring Provider: Baltazar Apo Height (inches): 8 Interpreting Physician: Chesley Mires MD, ABSM Weight (lbs): 250 RPSGT: Baxter Flattery BMI: 43 MRN: [id number] Neck Size: 17.00  CLINICAL INFORMATION Sleep Study Type: NPSG  Indication for sleep study: Obesity, Snoring, Witnesses Apnea / Gasping During Sleep  Epworth Sleepiness Score: 10  SLEEP STUDY TECHNIQUE As per the AASM Manual for the Scoring of Sleep and Associated Events v2.3 (April 2016) with a hypopnea requiring 4% desaturations.  The channels recorded and monitored were frontal, central and occipital EEG, electrooculogram (EOG), submentalis EMG (chin), nasal and oral airflow, thoracic and abdominal wall motion, anterior tibialis EMG, snore microphone, electrocardiogram, and pulse oximetry.  MEDICATIONS Medications self-administered by patient taken the night of the study : XANAX, OMEGA-3, IVABRADINE, MECLIZINE, GABAPENTIN  SLEEP ARCHITECTURE The study was initiated at 10:30:53 PM and ended at 5:04:30 AM.  Sleep onset time was 94.4 minutes and the sleep efficiency was 68.3%%. The total sleep time was 269 minutes.  Stage REM latency was N/A minutes.  The patient spent 3.3%% of the night in stage N1 sleep, 96.7%% in stage N2 sleep, 0.0%% in stage N3 and 0% in REM.  Alpha intrusion was absent.  Supine sleep was 100.00%.  RESPIRATORY PARAMETERS The overall apnea/hypopnea index (AHI) was 2.2 per hour. There were 4 total apneas, including 4 obstructive, 0 central and 0 mixed apneas. There were 6 hypopneas and 9 RERAs.  The AHI during Stage REM sleep was N/A per hour.  AHI while supine was 2.2 per hour.  The mean oxygen saturation was 91.5%. The minimum SpO2 during sleep was 88.0%.  loud snoring was noted during this study.  CARDIAC DATA The 2 lead EKG demonstrated sinus rhythm, pacemaker generated. The  mean heart rate was 64.5 beats per minute. Other EKG findings include: Atrial Fibrillation.  LEG MOVEMENT DATA The total PLMS were 0 with a resulting PLMS index of 0.0. Associated arousal with leg movement index was 0.0 .  IMPRESSIONS - No significant obstructive sleep apnea occurred during this study (AHI = 2.2/h). - No significant central sleep apnea occurred during this study (CAI = 0.0/h). - The patient had minimal or no oxygen desaturation during the study (Min O2 = 88.0%). - The patient snored with loud snoring volume. - EKG findings include Atrial Fibrillation.  Review of records indicates that she has a history of paroxysmal atrial fibrillation.  DIAGNOSIS - Snoring.  RECOMMENDATIONS - Avoid alcohol, sedatives and other CNS depressants that may worsen sleep apnea and disrupt normal sleep architecture. - Sleep hygiene should be reviewed to assess factors that may improve sleep quality. - Weight management and regular exercise should be initiated or continued if appropriate.  [Electronically signed] 12/11/2020 09:25 AM  Chesley Mires MD, [place], American Board of Sleep Medicine   NPI: [id number]

## 2020-12-13 ENCOUNTER — Other Ambulatory Visit (HOSPITAL_COMMUNITY)
Admission: RE | Admit: 2020-12-13 | Discharge: 2020-12-13 | Disposition: A | Discharge status: Home / Self Care | Payer: Medicare Other | Source: Ambulatory Visit | Attending: Emergency Medicine | Admitting: Emergency Medicine

## 2020-12-13 DIAGNOSIS — Z20822 Contact with and (suspected) exposure to covid-19: Secondary | ICD-10-CM | POA: Insufficient documentation

## 2020-12-13 DIAGNOSIS — Z01812 Encounter for preprocedural laboratory examination: Secondary | ICD-10-CM | POA: Diagnosis present

## 2020-12-13 LAB — SARS CORONAVIRUS 2 (TAT 6-24 HRS): SARS Coronavirus 2: NEGATIVE

## 2020-12-17 ENCOUNTER — Ambulatory Visit: Payer: Medicare Other | Admitting: Acute Care

## 2020-12-22 ENCOUNTER — Ambulatory Visit (HOSPITAL_COMMUNITY)
Admission: RE | Admit: 2020-12-22 | Discharge: 2020-12-22 | Disposition: A | Discharge status: Home / Self Care | Payer: Medicare Other | Source: Ambulatory Visit | Attending: Orthopedic Surgery | Admitting: Orthopedic Surgery

## 2020-12-22 ENCOUNTER — Other Ambulatory Visit: Payer: Self-pay

## 2020-12-22 ENCOUNTER — Ambulatory Visit (INDEPENDENT_AMBULATORY_CARE_PROVIDER_SITE_OTHER): Payer: Medicare Other | Admitting: Orthopedic Surgery

## 2020-12-22 ENCOUNTER — Encounter: Payer: Self-pay | Admitting: Orthopedic Surgery

## 2020-12-22 ENCOUNTER — Ambulatory Visit (INDEPENDENT_AMBULATORY_CARE_PROVIDER_SITE_OTHER): Payer: Medicare Other

## 2020-12-22 DIAGNOSIS — M79605 Pain in left leg: Secondary | ICD-10-CM

## 2020-12-22 DIAGNOSIS — M25562 Pain in left knee: Secondary | ICD-10-CM

## 2020-12-22 MED ORDER — PREDNISONE 10 MG PO TABS: 10.0000 mg | ORAL_TABLET | Freq: Every day | ORAL | 1 refills | Status: DC

## 2020-12-22 NOTE — Progress Notes (Signed)
Office Visit Note   Patient: Meagan Ryan           Date of Birth: Jan 01, 1956           MRN: [id number] Visit Date: 12/22/2020              Requested by: Hulan Fess, MD [place],  [place] [address] PCP: Hulan Fess, MD  Chief Complaint  Patient presents with  . Left Knee - Pain      HPI: Patient is a 65 year old woman who presents complaining of pain over the medial aspect proximal left calf.  She states has been present for 2 to 3 weeks.  She complains of pain along the distribution of the greater saphenous vein.  Patient states she is on Eliquis she is status post pacemaker placement secondary to Covid.  Patient also states she has been told she needs CPAP.  Assessment & Plan: Visit Diagnoses:  1. Pain in left leg   2. Acute pain of left knee     Plan: We will send her over for a ultrasound to rule out DVT.  The ultrasound was reported to [place] as negative.  We will place her on a low-dose prednisone with 10 mg with breakfast.  Patient may have sciatic symptoms.  Also recommended knee-high compression stockings for the venous insufficiency.  Follow-Up Instructions: No follow-ups on file.   Ortho Exam  Patient is alert, oriented, no adenopathy, well-dressed, normal affect, normal respiratory effort. Examination patient does have venous swelling to the knee is asymptomatic there is no effusion no redness no cellulitis she is tender to palpation along the distribution of the greater saphenous vein.  Dorsiflexion of the foot reproduces pain compression of the calf reproduces pain.  She has a good dorsalis pedis pulse and a good popliteal pulse.  Imaging: No results found. No images are attached to the encounter.  Labs: Lab Results  Component Value Date   CRP 4.7 (H) 04/15/2019   LABURIC 5.5 05/08/2018   REPTSTATUS 04/20/2019 FINAL 04/15/2019   CULT  04/15/2019    NO GROWTH 5 DAYS Performed at [facility], [place] [address] [place], [place]  [id number]      Lab Results  Component Value Date   ALBUMIN 3.4 (L) 06/14/2019   ALBUMIN 2.8 (L) 04/17/2019   ALBUMIN 2.9 (L) 04/16/2019   LABURIC 5.5 05/08/2018    No results found for: MG No results found for: VD25OH  No results found for: PREALBUMIN CBC EXTENDED Latest Ref Rng & Units 02/20/2020 09/27/2019 06/14/2019  WBC 4.0 - 10.5 K/uL 5.0 4.9 5.4  RBC 3.87 - 5.11 MIL/uL 5.03 5.19 4.35  HGB 12.0 - 15.0 g/dL 13.1 13.8 12.1  HCT 36.0 - 46.0 % 42.2 42.1 38.1  PLT 150 - 400 K/uL 253 287 250  NEUTROABS 1.7 - 7.7 K/uL - - 2.5  LYMPHSABS 0.7 - 4.0 K/uL - - 2.0     There is no height or weight on file to calculate BMI.  Orders:  Orders Placed This Encounter  Procedures  . XR Knee 1-2 Views Left  . VAS [place] LOWER EXTREMITY VENOUS (DVT)   No orders of the defined types were placed in this encounter.    Procedures: No procedures performed  Clinical Data: No additional findings.  ROS:  All other systems negative, except as noted in the HPI. Review of Systems  Objective: Vital Signs: There were no vitals taken for this visit.  Specialty Comments:  No specialty comments available.  PMFS History: Patient Active Problem List   Diagnosis Date Noted  . Dyspnea 01/14/2020  . Symptomatic advanced heart block 04/15/2019  . COVID-19 virus infection 04/15/2019  . History of left breast cancer 05/01/2018  . Genetic testing 03/13/2018  . Family history of breast cancer   . Malignant neoplasm of upper-outer quadrant of right breast in female, estrogen receptor positive ([place]) 02/23/2018  . Syncope 12/14/2017  . Pain and swelling of right lower leg 10/28/2016  . Right leg numbness 10/28/2016  . Chronic systolic CHF (congestive heart failure), NYHA class 2 ([place]) 02/26/2016  . OSA (obstructive sleep apnea) 12/31/2015  . History of breast cancer in female 10/30/2015  . Ovarian cyst 10/10/2015  . Bilateral ovarian cysts 10/09/2015  . S/P oophorectomy 10/09/2015  . Morbid obesity  with BMI of 40.0-44.9, adult ([place]) 08/08/2015  . Benign essential HTN 09/18/2014  . RBBB 09/18/2014  . Incomplete RBBB   . DCM (dilated cardiomyopathy) ([place]) 04/24/2014  . Meniere disease 06/02/2012   Past Medical History:  Diagnosis Date  . Allergic rhinitis   . Asthma   . Breast cancer ([place]) 1996   Left breast-stage II infiltrating ductal carcinoma, 3/8 axillary lymph nodes were positive for metastatic carcinoma, getting yearly mammogram and every other year MRI followed by oncologist Dr Benay Spice  . Complication of anesthesia    WAKE UP WITH HEADACHE, difficulty waking up  . DCM (dilated cardiomyopathy) ([place])    EF 55-60% by echo 04/2019  . Dysuria 2011  . Family history of adverse reaction to anesthesia    "family wakes up with personality changes, but are very short term"  . Fatty liver CT 1/12   elevated LFT  . Genetic testing 03/13/2018   Common cancers panel (47 genes) @ [facility] - No pathogenic mutations detected  . H/O fibromyalgia   . H/O osteopenia 2009  . H/O peripheral neuropathy   . H/O varicose veins   . Hyperlipidemia   . Hypertension   . Incomplete RBBB   . 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 OSA (obstructive sleep apnea) 12/31/2015   Very mild with AHI 6/hr but elevated RDI at 32/hr.  Intolerant to PAP therapy  . Pelvic pain in female   . PMB (postmenopausal bleeding) 2011  . Pregnancy induced hypertension   . Right-sided lacunar infarction ([place])    MRI 1.2012  . Tubular adenoma of colon     Family History  Problem Relation Age of Onset  . Heart disease Mother        MI  . Stroke Mother   . Diabetes Mother   . Hypertension Mother   . Stroke Father   . Prostate cancer Father 22       deceased 79  . Colon cancer Sister 61       currently 34  . Breast cancer Paternal Aunt 106       deceased 59s  . Breast cancer Cousin        daughter of pat aunt w/ breast ca  . Breast cancer Cousin        daughter of pat aunt w/ breast ca    Past Surgical  History:  Procedure Laterality Date  . Gastroc muscle repair  2007  . LAPAROSCOPIC CHOLECYSTECTOMY    . MASTECTOMY  06/1995   Left - for infiltrating ductal carcinoma  . MASTECTOMY W/ SENTINEL NODE BIOPSY Right 03/31/2018  . MASTECTOMY W/ SENTINEL NODE BIOPSY Right  03/31/2018   Procedure: RIGHT TOTAL MASTECTOMY WITH AXILLARY SENTINEL LYMPH NODE BIOPSY ERAS PATHWAY;  Surgeon: Excell Seltzer, MD;  Location: [facility];  Service: General;  Laterality: Right;  . 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 PACEMAKER IMPLANT N/A 04/16/2019   Procedure: PACEMAKER IMPLANT;  Surgeon: Constance Haw, MD;  Location: [facility];  Service: Cardiovascular;  Laterality: N/A;  . ROBOTIC ASSISTED TOTAL HYSTERECTOMY WITH BILATERAL SALPINGO OOPHERECTOMY Bilateral 10/09/2015   Procedure: ROBOTIC ASSISTED BILATERAL SALPINGO OOPHORECTOMY ;  Surgeon: Everitt Amber, MD;  Location: [facility];  Service: Gynecology;  Laterality: Bilateral;  . Tram surgery  1996  . TUBAL LIGATION     Social History   Occupational History  . Not on file  Tobacco Use  . Smoking status: Never Smoker  . Smokeless tobacco: Never Used  Vaping Use  . Vaping Use: Never used  Substance and Sexual Activity  . Alcohol use: No  . Drug use: No  . Sexual activity: Not on file

## 2020-12-29 ENCOUNTER — Telehealth: Payer: Self-pay | Admitting: Emergency Medicine

## 2020-12-29 NOTE — Telephone Encounter (Signed)
Spoke with patient. She verbalized understanding of results. Nothing further needed at time of call.  

## 2020-12-29 NOTE — Telephone Encounter (Signed)
Called and spoke with pt to clarify if it was an ONO or a sleep study that she had done and pt stated she had a sleep study performed. Stated to pt once we had the results of the sleep study that we would call her with the results and she verbalized understanding.  Looking at pt's chart, pt had split night study performed 12/07/20.  Dr. Lamonte Sakai, please advise on results.

## 2020-12-29 NOTE — Telephone Encounter (Signed)
Please let her know that her PSG did not show any significant sleep apnea. There is no indication to get an in-lab sleep study at this time.

## 2021-01-13 ENCOUNTER — Ambulatory Visit (INDEPENDENT_AMBULATORY_CARE_PROVIDER_SITE_OTHER): Payer: Medicare Other

## 2021-01-13 DIAGNOSIS — I441 Atrioventricular block, second degree: Secondary | ICD-10-CM

## 2021-01-13 LAB — CUP PACEART REMOTE DEVICE CHECK
Battery Remaining Longevity: 126 mo
Battery Voltage: 3.01 V
Brady Statistic AP VP Percent: 36.31 %
Brady Statistic AP VS Percent: 0 %
Brady Statistic AS VP Percent: 63.67 %
Brady Statistic AS VS Percent: 0.02 %
Brady Statistic RA Percent Paced: 36.2 %
Brady Statistic RV Percent Paced: 99.98 %
Date Time Interrogation Session: 20220307205533
Implantable Lead Implant Date: 20200608
Implantable Lead Implant Date: 20200608
Implantable Lead Location: 753859
Implantable Lead Location: 753860
Implantable Lead Model: 5076
Implantable Lead Model: 5076
Implantable Pulse Generator Implant Date: 20200608
Lead Channel Impedance Value: 361 Ohm
Lead Channel Impedance Value: 380 Ohm
Lead Channel Impedance Value: 399 Ohm
Lead Channel Impedance Value: 456 Ohm
Lead Channel Pacing Threshold Amplitude: 0.75 V
Lead Channel Pacing Threshold Amplitude: 0.875 V
Lead Channel Pacing Threshold Pulse Width: 0.4 ms
Lead Channel Pacing Threshold Pulse Width: 0.4 ms
Lead Channel Sensing Intrinsic Amplitude: 15.375 mV
Lead Channel Sensing Intrinsic Amplitude: 15.375 mV
Lead Channel Sensing Intrinsic Amplitude: 3.125 mV
Lead Channel Sensing Intrinsic Amplitude: 3.125 mV
Lead Channel Setting Pacing Amplitude: 1.5 V
Lead Channel Setting Pacing Amplitude: 2.5 V
Lead Channel Setting Pacing Pulse Width: 0.4 ms
Lead Channel Setting Sensing Sensitivity: 2.8 mV

## 2021-01-21 NOTE — Progress Notes (Signed)
Remote pacemaker transmission.   

## 2021-01-24 ENCOUNTER — Other Ambulatory Visit (HOSPITAL_COMMUNITY): Payer: Medicare Other

## 2021-01-27 ENCOUNTER — Ambulatory Visit: Payer: Medicare Other | Admitting: Emergency Medicine

## 2021-03-03 ENCOUNTER — Telehealth: Payer: Self-pay

## 2021-03-03 ENCOUNTER — Other Ambulatory Visit: Payer: Self-pay

## 2021-03-03 MED ORDER — PREDNISONE 10 MG PO TABS: 10.0000 mg | ORAL_TABLET | Freq: Every day | ORAL | 1 refills | Status: AC

## 2021-03-03 NOTE — Telephone Encounter (Signed)
Patient is requesting a refill of Prednisone.  She uses [facility], [place].

## 2021-03-03 NOTE — Telephone Encounter (Signed)
Ok refill per Dr. Sharol Given this has been sent to pharm.

## 2021-03-09 ENCOUNTER — Ambulatory Visit (INDEPENDENT_AMBULATORY_CARE_PROVIDER_SITE_OTHER): Payer: Medicare Other | Admitting: Orthopedic Surgery

## 2021-03-09 ENCOUNTER — Encounter: Payer: Self-pay | Admitting: Orthopedic Surgery

## 2021-03-09 ENCOUNTER — Telehealth: Payer: Self-pay

## 2021-03-09 DIAGNOSIS — M25562 Pain in left knee: Secondary | ICD-10-CM | POA: Diagnosis not present

## 2021-03-09 DIAGNOSIS — G8929 Other chronic pain: Secondary | ICD-10-CM | POA: Diagnosis not present

## 2021-03-09 MED ORDER — LIDOCAINE HCL (PF) 1 % IJ SOLN
5.0000 mL | INTRAMUSCULAR | Status: AC | PRN
  Administered 2021-03-09: 5 mL

## 2021-03-09 MED ORDER — METHYLPREDNISOLONE ACETATE 40 MG/ML IJ SUSP
40.0000 mg | INTRAMUSCULAR | Status: AC | PRN
  Administered 2021-03-09: 40 mg via INTRA_ARTICULAR

## 2021-03-09 NOTE — Telephone Encounter (Signed)
Per Dr. Sharol Given lets see pt today at anytime for eval.

## 2021-03-09 NOTE — Progress Notes (Signed)
Office Visit Note   Patient: Meagan Ryan           Date of Birth: September 17, 1956           MRN: [id number] Visit Date: 03/09/2021              Requested by: Hulan Fess, MD [place],  [place] [address] PCP: Hulan Fess, MD  Chief Complaint  Patient presents with  . Left Leg - Pain      HPI: Patient is a 65 year old woman who presents with off and on pain medial joint line left knee for the past 3 to 4 months.  Patient states the pain shoots across the knee she has pain at rest pain with walking.  Assessment & Plan: Visit Diagnoses:  1. Chronic pain of left knee     Plan: Patient's left knee was injected she tolerated this well if this did not provide sufficient relief we would need to get a CT scan of her left knee she does have a pacemaker and would not be a candidate for an MRI scan.  Follow-Up Instructions: Return if symptoms worsen or fail to improve.   Ortho Exam  Patient is alert, oriented, no adenopathy, well-dressed, normal affect, normal respiratory effort. Examination patient is ambulating in a wheelchair.  She has a negative straight leg raise on the left no focal motor weakness in the left lower extremity she has a good pulse.  Examination of the left knee she is point tender to palpation over the medial joint line collaterals and cruciates are stable.  The calf is soft nontender no evidence of a DVT no evidence of a herniated disc.  Imaging: No results found. No images are attached to the encounter.  Labs: Lab Results  Component Value Date   CRP 4.7 (H) 04/15/2019   LABURIC 5.5 05/08/2018   REPTSTATUS 04/20/2019 FINAL 04/15/2019   CULT  04/15/2019    NO GROWTH 5 DAYS Performed at [facility], [place] [address] [place], [place] [address]      Lab Results  Component Value Date   ALBUMIN 3.4 (L) 06/14/2019   ALBUMIN 2.8 (L) 04/17/2019   ALBUMIN 2.9 (L) 04/16/2019    No results found for: MG No results found for:  VD25OH  No results found for: PREALBUMIN CBC EXTENDED Latest Ref Rng & Units 02/20/2020 09/27/2019 06/14/2019  WBC 4.0 - 10.5 K/uL 5.0 4.9 5.4  RBC 3.87 - 5.11 MIL/uL 5.03 5.19 4.35  HGB 12.0 - 15.0 g/dL 13.1 13.8 12.1  HCT 36.0 - 46.0 % 42.2 42.1 38.1  PLT 150 - 400 K/uL 253 287 250  NEUTROABS 1.7 - 7.7 K/uL - - 2.5  LYMPHSABS 0.7 - 4.0 K/uL - - 2.0     There is no height or weight on file to calculate BMI.  Orders:  Orders Placed This Encounter  Procedures  . Large Joint Inj   No orders of the defined types were placed in this encounter.    Procedures: Large Joint Inj: L knee on 03/09/2021 2:40 PM Indications: pain and diagnostic evaluation Details: 22 G 1.5 in needle, anteromedial approach  Arthrogram: No  Medications: 5 mL lidocaine (PF) 1 %; 40 mg methylPREDNISolone acetate 40 MG/ML Outcome: tolerated well, no immediate complications Procedure, treatment alternatives, risks and benefits explained, specific risks discussed. Consent was given by the patient. Immediately prior to procedure a time out was called to verify the correct patient, procedure, equipment, support staff and site/side  marked as required. Patient was prepped and draped in the usual sterile fashion.      Clinical Data: No additional findings.  ROS:  All other systems negative, except as noted in the HPI. Review of Systems  Objective: Vital Signs: There were no vitals taken for this visit.  Specialty Comments:  No specialty comments available.  PMFS History: Patient Active Problem List   Diagnosis Date Noted  . Dyspnea 01/14/2020  . Symptomatic advanced heart block 04/15/2019  . COVID-19 virus infection 04/15/2019  . History of left breast cancer 05/01/2018  . Genetic testing 03/13/2018  . Family history of breast cancer   . Malignant neoplasm of upper-outer quadrant of right breast in female, estrogen receptor positive ([place]) 02/23/2018  . Syncope 12/14/2017  . Pain and swelling of right  lower leg 10/28/2016  . Right leg numbness 10/28/2016  . Chronic systolic CHF (congestive heart failure), NYHA class 2 ([place]) 02/26/2016  . OSA (obstructive sleep apnea) 12/31/2015  . History of breast cancer in female 10/30/2015  . Ovarian cyst 10/10/2015  . Bilateral ovarian cysts 10/09/2015  . S/P oophorectomy 10/09/2015  . Morbid obesity with BMI of 40.0-44.9, adult ([place]) 08/08/2015  . Benign essential HTN 09/18/2014  . RBBB 09/18/2014  . Incomplete RBBB   . DCM (dilated cardiomyopathy) ([place]) 04/24/2014  . Meniere disease 06/02/2012   Past Medical History:  Diagnosis Date  . Allergic rhinitis   . Asthma   . Breast cancer ([place]) 1996   Left breast-stage II infiltrating ductal carcinoma, 3/8 axillary lymph nodes were positive for metastatic carcinoma, getting yearly mammogram and every other year MRI followed by oncologist Dr Benay Spice  . Complication of anesthesia    WAKE UP WITH HEADACHE, difficulty waking up  . DCM (dilated cardiomyopathy) ([place])    EF 55-60% by echo 04/2019  . Dysuria 2011  . Family history of adverse reaction to anesthesia    "family wakes up with personality changes, but are very short term"  . Fatty liver CT 1/12   elevated LFT  . Genetic testing 03/13/2018   Common cancers panel (47 genes) @ [facility] - No pathogenic mutations detected  . H/O fibromyalgia   . H/O osteopenia 2009  . H/O peripheral neuropathy   . H/O varicose veins   . Hyperlipidemia   . Hypertension   . Incomplete RBBB   . 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 OSA (obstructive sleep apnea) 12/31/2015   Very mild with AHI 6/hr but elevated RDI at 32/hr.  Intolerant to PAP therapy  . Pelvic pain in female   . PMB (postmenopausal bleeding) 2011  . Pregnancy induced hypertension   . Right-sided lacunar infarction ([place])    MRI 1.2012  . Tubular adenoma of colon     Family History  Problem Relation Age of Onset  . Heart disease Mother        MI  . Stroke Mother   . Diabetes Mother    . Hypertension Mother   . Stroke Father   . Prostate cancer Father 28       deceased 72  . Colon cancer Sister 71       currently 15  . Breast cancer Paternal Aunt 47       deceased 40s  . Breast cancer Cousin        daughter of pat aunt w/ breast ca  . Breast cancer Cousin        daughter of pat aunt w/  breast ca    Past Surgical History:  Procedure Laterality Date  . Gastroc muscle repair  2007  . LAPAROSCOPIC CHOLECYSTECTOMY    . MASTECTOMY  06/1995   Left - for infiltrating ductal carcinoma  . MASTECTOMY W/ SENTINEL NODE BIOPSY Right 03/31/2018  . MASTECTOMY W/ SENTINEL NODE BIOPSY Right 03/31/2018   Procedure: RIGHT TOTAL MASTECTOMY WITH AXILLARY SENTINEL LYMPH NODE BIOPSY ERAS PATHWAY;  Surgeon: Excell Seltzer, MD;  Location: [facility];  Service: General;  Laterality: Right;  . 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 PACEMAKER IMPLANT N/A 04/16/2019   Procedure: PACEMAKER IMPLANT;  Surgeon: Constance Haw, MD;  Location: [facility];  Service: Cardiovascular;  Laterality: N/A;  . ROBOTIC ASSISTED TOTAL HYSTERECTOMY WITH BILATERAL SALPINGO OOPHERECTOMY Bilateral 10/09/2015   Procedure: ROBOTIC ASSISTED BILATERAL SALPINGO OOPHORECTOMY ;  Surgeon: Everitt Amber, MD;  Location: [facility];  Service: Gynecology;  Laterality: Bilateral;  . Tram surgery  1996  . TUBAL LIGATION     Social History   Occupational History  . Not on file  Tobacco Use  . Smoking status: Never Smoker  . Smokeless tobacco: Never Used  Vaping Use  . Vaping Use: Never used  Substance and Sexual Activity  . Alcohol use: No  . Drug use: No  . Sexual activity: Not on file

## 2021-03-09 NOTE — Telephone Encounter (Signed)
Patient scheduled for appointment today

## 2021-03-09 NOTE — Telephone Encounter (Signed)
Patient is currently taking Prednisone.  She is having increasing leg pain to the point that she is in tears.  Question of a MRI mentioned but she has a Pacemaker.

## 2021-03-26 ENCOUNTER — Other Ambulatory Visit: Payer: Self-pay

## 2021-03-26 DIAGNOSIS — G8929 Other chronic pain: Secondary | ICD-10-CM

## 2021-03-29 ENCOUNTER — Other Ambulatory Visit: Payer: Self-pay | Admitting: Hematology

## 2021-03-29 DIAGNOSIS — C50411 Malignant neoplasm of upper-outer quadrant of right female breast: Secondary | ICD-10-CM

## 2021-03-30 ENCOUNTER — Other Ambulatory Visit: Payer: Self-pay | Admitting: Cardiology

## 2021-03-30 ENCOUNTER — Telehealth: Payer: Self-pay | Admitting: Hematology

## 2021-03-30 NOTE — Telephone Encounter (Signed)
Scheduled appts per 5/23 sch msg. Called pt multiple times. Kept saying that call could not be completed at this time and no vm was available. Mailed updated calendar to pt.

## 2021-03-30 NOTE — Telephone Encounter (Signed)
Pt lost to follow up.  Scheduling message sent.

## 2021-03-30 NOTE — Telephone Encounter (Signed)
Pt called back and I gave her appt information

## 2021-03-31 MED ORDER — ENTRESTO 24-26 MG PO TABS: 1.0000 | ORAL_TABLET | Freq: Two times a day (BID) | ORAL | 0 refills | Status: DC

## 2021-03-31 MED ORDER — IVABRADINE HCL 5 MG PO TABS: 5.0000 mg | ORAL_TABLET | Freq: Two times a day (BID) | ORAL | 0 refills | Status: DC

## 2021-04-07 ENCOUNTER — Ambulatory Visit: Payer: Medicare Other | Admitting: Hematology

## 2021-04-07 ENCOUNTER — Other Ambulatory Visit: Payer: Medicare Other

## 2021-04-08 NOTE — Progress Notes (Signed)
[facility]   [phone] [phone]   Clinic Follow up Note   Patient Care Team: Hulan Fess, MD as PCP - General (Family Medicine) Constance Haw, MD as PCP - Electrophysiology (Cardiology) Sueanne Margarita, MD as Consulting Physician (Cardiology) Gery Pray, MD as Consulting Physician (Radiation Oncology) Excell Seltzer, MD (Inactive) as Consulting Physician (General Surgery) Truitt Merle, MD as Consulting Physician (Hematology) Delice Bison Charlestine Massed, NP as Nurse Practitioner (Hematology and Oncology)  Date of Service:  04/10/2021  CHIEF COMPLAINT: F/u of right breast cancer  SUMMARY OF ONCOLOGIC HISTORY: Oncology History Overview Note  Cancer Staging Malignant neoplasm of upper-outer quadrant of right breast in female, estrogen receptor positive ([place]) Staging form: Breast, AJCC 8th Edition - Clinical stage from 02/21/2018: Stage IA (cT1b, cN0, cM0, G1, ER+, PR+, HER2-) - Signed by Truitt Merle, MD on 03/01/2018     Malignant neoplasm of upper-outer quadrant of right breast in female, estrogen receptor positive ([place])  12/25/2015 Imaging   12/25/2015 Digital Screen Unilat R IMPRESSION: Further evaluation is suggested for possible distortion in the right breast.   01/02/2016 Imaging   01/02/2016 MM DIAG Breast TOMO Uni Right IMPRESSION: Further evaluation is suggested for possible distortion in the right breast.   01/12/2017 Imaging   01/12/2017 MM Digital Screening Unilat R IMPRESSION: No mammographic evidence of malignancy. A result letter of this screening mammogram will be mailed directly to the patient.   02/07/2018 Imaging   02/07/2018 MM 3D Screen Breast UNI Right IMPRESSION: Further evaluation is suggested for possible mass in the right breast.    02/16/2018 Mammogram   IMPRESSION: Irregular hypoechoic mass in the RIGHT breast at the 12 o'clock axis, retroareolar, measuring 7 mm, corresponding to the new mass seen on  mammogram. This is a suspicious finding for which ultrasound-guided biopsy is recommended.   02/21/2018 Initial Biopsy   Diagnosis 02/21/18 Breast, right, needle core biopsy, 12 o'clock - INVASIVE DUCTAL CARCINOMA. - DUCTAL CARCINOMA IN SITU. - SEE COMMENT.   02/21/2018 Receptors her2   Prognostic indicators significant for: ER, 100% positive and PR, 100% positive or negative, both with strong staining intensity. Proliferation marker Ki67 at 5%. HER2 negative.   02/21/2018 Cancer Staging   Staging form: Breast, AJCC 8th Edition - Clinical stage from 02/21/2018: Stage IA (cT1b, cN0, cM0, G1, ER+, PR+, HER2-) - Signed by Truitt Merle, MD on 03/01/2018   02/21/2018 Imaging   02/21/2018 [place] RT Breast BX W LOC DEV 1st Lesion IMG BX SPEC US Guide IMPRESSION: Ultrasound-guided biopsy of the RIGHT breast mass at the 12 o'clock axis. No apparent complications   06/19/7516 Initial Diagnosis   Malignant neoplasm of upper-outer quadrant of right breast in female, estrogen receptor positive ([place])   03/31/2018 Surgery   03/31/2018 Right total mastectomy 65 year old female with a new diagnosis of cancer of the right breast, upper outer quadrant. Clinical stage one a, ER positive, PR positive, HER-2 negative.  She has a remote history of left breast cancer treated with modified mastectomy and TRAM flap reconstruction in the 1990s.  After discussion of treatment options and risks extensively detailed elsewhere she has elected to proceed with right total mastectomy and axillary sentinel lymph node biopsy as initial surgical therapy.   03/31/2018 Pathology Results   03/31/2018 Diagnosis 1. Breast, simple mastectomy, Right Total - INVASIVE AND IN SITU DUCTAL CARCINOMA, 0.6 CM. - MARGINS NOT INVOLVED. - FIBROCYSTIC CHANGES. - BIOPSY SITE AND BIOPSY CLIP. 2. Lymph node, sentinel, biopsy, Right Axillary #  1 - ONE BENIGN LYMPH NODE (0/1). 3. Lymph node, sentinel, biopsy, Right Axillary #2 - ONE BENIGN LYMPH NODE  (0/1).    05/01/2018 -  Anti-estrogen oral therapy   Discontinued Evista. Anastrozole 1 mg daily       CURRENT THERAPY:  Anastrozole 67m daily, started 04/2018  INTERVAL HISTORY:  Meagan Ryan here for a follow up of right breast cancer. Last visit was 4 months ago. She presents to the clinic alone. She notes since 6 months ago she has had left medial knee pain that radiates down to her foot. She is seeing her orthopedic surgeon about this. She has had multiple scans and injections already and scheduled for more next week.     REVIEW OF SYSTEMS:   Constitutional: Denies fevers, chills or abnormal weight loss Eyes: Denies blurriness of vision Ears, nose, mouth, throat, and face: Denies mucositis or sore throat Respiratory: Denies cough, dyspnea or wheezes Cardiovascular: Denies palpitation, chest discomfort or lower extremity swelling Gastrointestinal:  Denies nausea, heartburn or change in bowel habits Skin: Denies abnormal skin rashes MSK: (+) Left medial knee pain Lymphatics: Denies new lymphadenopathy or easy bruising Neurological:Denies numbness, tingling or new weaknesses Behavioral/Psych: Mood is stable, no new changes  All other systems were reviewed with the patient and are negative.  MEDICAL HISTORY:  Past Medical History:  Diagnosis Date  . Allergic rhinitis   . Asthma   . Breast cancer ([facility] 1996   Left breast-stage II infiltrating ductal carcinoma, 3/8 axillary lymph nodes were positive for metastatic carcinoma, getting yearly mammogram and every other year MRI followed by oncologist Dr SBenay Spice . Complication of anesthesia    WAKE UP WITH HEADACHE, difficulty waking up  . DCM (dilated cardiomyopathy) ([facility]    EF 55-60% by echo 04/2019  . Dysuria 2011  . Family history of adverse reaction to anesthesia    "family wakes up with personality changes, but are very short term"  . Fatty liver CT 1/12   elevated LFT  . Genetic testing 03/13/2018   Common  cancers panel (47 genes) @ [facility] - No pathogenic mutations detected  . H/O fibromyalgia   . H/O osteopenia 2009  . H/O peripheral neuropathy   . H/O varicose veins   . Hyperlipidemia   . Hypertension   . Incomplete RBBB   . 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 OSA (obstructive sleep apnea) 12/31/2015   Very mild with AHI 6/hr but elevated RDI at 32/hr.  Intolerant to PAP therapy  . Pelvic pain in female   . PMB (postmenopausal bleeding) 2011  . Pregnancy induced hypertension   . Right-sided lacunar infarction ([facility]    MRI 1.2012  . Tubular adenoma of colon     SURGICAL HISTORY: Past Surgical History:  Procedure Laterality Date  . Gastroc muscle repair  2007  . LAPAROSCOPIC CHOLECYSTECTOMY    . MASTECTOMY  06/1995   Left - for infiltrating ductal carcinoma  . MASTECTOMY W/ SENTINEL NODE BIOPSY Right 03/31/2018  . MASTECTOMY W/ SENTINEL NODE BIOPSY Right 03/31/2018   Procedure: RIGHT TOTAL MASTECTOMY WITH AXILLARY SENTINEL LYMPH NODE BIOPSY ERAS PATHWAY;  Surgeon: HExcell Seltzer MD;  [facility]  Service: General;  Laterality: Right;  . 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 PACEMAKER IMPLANT N/A 04/16/2019   Procedure: PACEMAKER IMPLANT;  Surgeon: CConstance Haw MD;  [facility];  Service: Cardiovascular;  Laterality: N/A;  .  ROBOTIC ASSISTED TOTAL HYSTERECTOMY WITH BILATERAL SALPINGO OOPHERECTOMY Bilateral 10/09/2015   Procedure: ROBOTIC ASSISTED BILATERAL SALPINGO OOPHORECTOMY ;  Surgeon: Everitt Amber, MD;  [facility];  Service: Gynecology;  Laterality: Bilateral;  . Tram surgery  1996  . TUBAL LIGATION      I have reviewed the social history and family history with the patient and they are unchanged from previous note.  ALLERGIES:  is allergic to amlodipine, noroxin [norfloxacin], cephalexin, chocolate, codeine, sulfa antibiotics, latex, and penicillins.  MEDICATIONS:  Current Outpatient Medications  Medication  Sig Dispense Refill  . ALPRAZolam (XANAX) 0.5 MG tablet Take 0.5 mg by mouth 2 (two) times daily.    Marland Kitchen anastrozole (ARIMIDEX) 1 MG tablet Take 1 tablet (1 mg total) by mouth daily. 90 tablet 3  . carvedilol (COREG) 12.5 MG tablet Take 1 tablet (12.5 mg total) by mouth 2 (two) times daily with a meal. 180 tablet 2  . cetirizine (ZYRTEC) 10 MG tablet Take 10 mg by mouth every morning.     . DULoxetine (CYMBALTA) 30 MG capsule Take 30 mg by mouth at bedtime.     Marland Kitchen ELIQUIS 5 MG TABS tablet TAKE 1 TABLET TWICE A DAY 180 tablet 3  . fluticasone (FLONASE) 50 MCG/ACT nasal spray Place 1 spray into both nostrils every morning.     . gabapentin (NEURONTIN) 300 MG capsule Take 300 mg by mouth 2 (two) times daily.    . ivabradine (CORLANOR) 5 MG TABS tablet Take 1 tablet (5 mg total) by mouth 2 (two) times daily with a meal. Please make yearly appt with Dr. Radford Pax for September 2022 for future refills. Thank you 1st attempt 180 tablet 0  . niacin 500 MG tablet Take 500 mg by mouth daily.    . Omega-3 Fatty Acids (FISH OIL) 1000 MG CAPS Take 2,000 mg by mouth 2 (two) times daily.    Marland Kitchen omeprazole (PRILOSEC) 20 MG capsule Take 20 mg by mouth daily.    . potassium chloride (K-DUR) 10 MEQ tablet Take 10 mEq by mouth daily.    . predniSONE (DELTASONE) 10 MG tablet Take 2 tablets (20 mg total) by mouth daily with breakfast. 60 tablet 0  . predniSONE (DELTASONE) 10 MG tablet Take 1 tablet (10 mg total) by mouth daily with breakfast. 30 tablet 1  . sacubitril-valsartan (ENTRESTO) 24-26 MG Take 1 tablet by mouth 2 (two) times daily. Please make overdue appt with Dr. Radford Pax for September 2022 for future refills. Thank you 1st attempt 180 tablet 0  . VITAMIN D PO Take 4,000 Units by mouth in the morning and at bedtime.      No current facility-administered medications for this visit.    PHYSICAL EXAMINATION: ECOG PERFORMANCE STATUS: 0 - Asymptomatic  Vitals:   04/10/21 1212  BP: (!) 173/80  Pulse: 67  Resp: 16   Temp: 97.8 F (36.6 C)  SpO2: 94%   Filed Weights   04/10/21 1212  Weight: 252 lb 9.6 oz (114.6 kg)    GENERAL:alert, no distress and comfortable SKIN: skin color, texture, turgor are normal, no rashes or significant lesions EYES: normal, Conjunctiva are pink and non-injected, sclera clear  NECK: supple, thyroid normal size, non-tender, without nodularity LYMPH:  no palpable lymphadenopathy in the cervical, axillary  LUNGS: clear to auscultation and percussion with normal breathing effort HEART: regular rate & rhythm and no murmurs and no lower extremity edema ABDOMEN:abdomen soft, non-tender and normal bowel sounds Musculoskeletal:no cyanosis of digits and no clubbing  NEURO:  alert & oriented x 3 with fluent speech, no focal motor/sensory deficits BREAST:s/p B/l mastectomy: Surgical incision healed well. No palpable mass, nodules or adenopathy bilaterally. Breast exam benign.   LABORATORY DATA:  I have reviewed the data as listed CBC Latest Ref Rng & Units 04/10/2021 02/20/2020 09/27/2019  WBC 4.0 - 10.5 K/uL 10.3 5.0 4.9  Hemoglobin 12.0 - 15.0 g/dL 13.2 13.1 13.8  Hematocrit 36.0 - 46.0 % 41.4 42.2 42.1  Platelets 150 - 400 K/uL 283 253 287     CMP Latest Ref Rng & Units 04/10/2021 02/20/2020 01/18/2020  Glucose 70 - 99 mg/dL 133(H) 91 82  BUN 8 - 23 mg/dL 10 6(L) 7(L)  Creatinine 0.44 - 1.00 mg/dL 0.92 0.92 1.06(H)  Sodium 135 - 145 mmol/L 135 136 134  Potassium 3.5 - 5.1 mmol/L 4.4 4.7 4.3  Chloride 98 - 111 mmol/L 102 102 97  CO2 22 - 32 mmol/L 23 25 21   Calcium 8.9 - 10.3 mg/dL 9.5 9.9 9.8  Total Protein 6.5 - 8.1 g/dL 7.2 - -  Total Bilirubin 0.3 - 1.2 mg/dL 0.7 - -  Alkaline Phos 38 - 126 U/L 66 - -  AST 15 - 41 U/L 10(L) - -  ALT 0 - 44 U/L 11 - -      RADIOGRAPHIC STUDIES: I have personally reviewed the radiological images as listed and agreed with the findings in the report. No results found.   ASSESSMENT & PLAN:  Meagan Ryan is a 65 y.o. female with     1. Malignant neoplasm of upper-outer quadrant of right breast, Invasive Ductal Carcinoma and DCIS, pT1bN0M0 stage IA, grade I, ER/PRpositive, HER2 negative -Diagnosed in 12/2015. Treated withrightmastectomy. Currently on anastrozole 1 mg daily, started in 04/2018. Tolerating well, no exacerbation of her manageable arthritis. -She is clinically doing well. Lab reviewed, her CBC and CMP are within normal limits except ANC 7.8. Her physical exam was unremarkable. There is no clinical concern for recurrence. -Will continue Surveillance. She had b/l mastectomies, no need for mammograms.  -She is overdue for DEXA (last in 2015 with osteopenia). She will request with new PCP in 2022.  -Continue anastrozole  -F/u in 6 months with NP Lacie  2. Previous left breast cancer in 1996, s/p left mastectomy, chemo, reconstruction surgery and Evista -No evidence of recurrence. Currently on Anastrozole for right breast cancer.  3. Obesitywith weight gain, OSA and dilated cardiomyopathy  -f/u with PCP and cardiologist Dr. Radford Pax. -She is gaining weight and upset about it.  -Her LL neuropathy and Menire disease limit her ability to exercise. -Her weight is currently maintained, but she struggles with losing weight.  -I previously discussed diet and exercise with her at length. I previously offered her the chance to attend [facility] weight [facility] clinic. She declined.  4. COVID-19 infection in May 2020 -She tested positive in late 03/2019.Her whole family had COVID and they all survived. -Her WUGQB-16 course was complicated by heart block and she was hospitalized.She hadpacemakerplaced on 04/16/19. She has since recovered.  -She continues to have residual fatigue, cough and SOB since her COVID infection. She will continue to f/u with her PCP, cardiologist and will be seen by pulmonology soon.    5. Left knee pain  -Since 6 months ago (end of 2021), she has had left medial knee pain that  radiates down her leg to foot.  -She continues to f/u with Orthopedic surgeon for work up and management.  -I do not think this  is related to anastrozole   Plan -She is clinically stable. -Continue Anastrozole  -Lab and f/u in 6 months with NP Lacie     No problem-specific Assessment & Plan notes found for this encounter.   No orders of the defined types were placed in this encounter.  All questions were answered. The patient knows to call the clinic with any problems, questions or concerns. No barriers to learning was detected. The total time spent in the appointment was 30 minutes.     Truitt Merle, MD 04/10/2021   I, Joslyn Devon, am acting as scribe for Truitt Merle, MD.   I have reviewed the above documentation for accuracy and completeness, and I agree with the above.

## 2021-04-10 ENCOUNTER — Inpatient Hospital Stay (HOSPITAL_BASED_OUTPATIENT_CLINIC_OR_DEPARTMENT_OTHER): Payer: Medicare Other | Admitting: Hematology

## 2021-04-10 ENCOUNTER — Other Ambulatory Visit: Payer: Self-pay

## 2021-04-10 ENCOUNTER — Inpatient Hospital Stay: Payer: Medicare Other | Attending: Hematology

## 2021-04-10 VITALS — BP 173/80 | HR 67 | Temp 97.8°F | Resp 16 | Ht 64.0 in | Wt 252.6 lb

## 2021-04-10 DIAGNOSIS — M25662 Stiffness of left knee, not elsewhere classified: Secondary | ICD-10-CM | POA: Insufficient documentation

## 2021-04-10 DIAGNOSIS — Z170 Estrogen receptor positive status [ER+]: Secondary | ICD-10-CM

## 2021-04-10 DIAGNOSIS — H8109 Meniere's disease, unspecified ear: Secondary | ICD-10-CM | POA: Insufficient documentation

## 2021-04-10 DIAGNOSIS — Z8616 Personal history of COVID-19: Secondary | ICD-10-CM | POA: Diagnosis not present

## 2021-04-10 DIAGNOSIS — E669 Obesity, unspecified: Secondary | ICD-10-CM | POA: Insufficient documentation

## 2021-04-10 DIAGNOSIS — Z79811 Long term (current) use of aromatase inhibitors: Secondary | ICD-10-CM | POA: Insufficient documentation

## 2021-04-10 DIAGNOSIS — Z950 Presence of cardiac pacemaker: Secondary | ICD-10-CM | POA: Insufficient documentation

## 2021-04-10 DIAGNOSIS — I420 Dilated cardiomyopathy: Secondary | ICD-10-CM | POA: Diagnosis not present

## 2021-04-10 DIAGNOSIS — I10 Essential (primary) hypertension: Secondary | ICD-10-CM

## 2021-04-10 DIAGNOSIS — M8580 Other specified disorders of bone density and structure, unspecified site: Secondary | ICD-10-CM | POA: Diagnosis not present

## 2021-04-10 DIAGNOSIS — Z9011 Acquired absence of right breast and nipple: Secondary | ICD-10-CM | POA: Diagnosis not present

## 2021-04-10 DIAGNOSIS — C50411 Malignant neoplasm of upper-outer quadrant of right female breast: Secondary | ICD-10-CM

## 2021-04-10 DIAGNOSIS — G4733 Obstructive sleep apnea (adult) (pediatric): Secondary | ICD-10-CM | POA: Insufficient documentation

## 2021-04-10 DIAGNOSIS — G629 Polyneuropathy, unspecified: Secondary | ICD-10-CM | POA: Insufficient documentation

## 2021-04-10 LAB — CBC WITH DIFFERENTIAL (CANCER CENTER ONLY)
Abs Immature Granulocytes: 0.11 10*3/uL — ABNORMAL HIGH (ref 0.00–0.07)
Basophils Absolute: 0.1 10*3/uL (ref 0.0–0.1)
Basophils Relative: 1 %
Eosinophils Absolute: 0 10*3/uL (ref 0.0–0.5)
Eosinophils Relative: 0 %
HCT: 41.4 % (ref 36.0–46.0)
Hemoglobin: 13.2 g/dL (ref 12.0–15.0)
Immature Granulocytes: 1 %
Lymphocytes Relative: 16 %
Lymphs Abs: 1.7 10*3/uL (ref 0.7–4.0)
MCH: 27.2 pg (ref 26.0–34.0)
MCHC: 31.9 g/dL (ref 30.0–36.0)
MCV: 85.2 fL (ref 80.0–100.0)
Monocytes Absolute: 0.6 10*3/uL (ref 0.1–1.0)
Monocytes Relative: 6 %
Neutro Abs: 7.8 10*3/uL — ABNORMAL HIGH (ref 1.7–7.7)
Neutrophils Relative %: 76 %
Platelet Count: 283 10*3/uL (ref 150–400)
RBC: 4.86 MIL/uL (ref 3.87–5.11)
RDW: 15.8 % — ABNORMAL HIGH (ref 11.5–15.5)
WBC Count: 10.3 10*3/uL (ref 4.0–10.5)
nRBC: 0 % (ref 0.0–0.2)

## 2021-04-10 LAB — CMP (CANCER CENTER ONLY)
ALT: 11 U/L (ref 0–44)
AST: 10 U/L — ABNORMAL LOW (ref 15–41)
Albumin: 3.4 g/dL — ABNORMAL LOW (ref 3.5–5.0)
Alkaline Phosphatase: 66 U/L (ref 38–126)
Anion gap: 10 (ref 5–15)
BUN: 10 mg/dL (ref 8–23)
CO2: 23 mmol/L (ref 22–32)
Calcium: 9.5 mg/dL (ref 8.9–10.3)
Chloride: 102 mmol/L (ref 98–111)
Creatinine: 0.92 mg/dL (ref 0.44–1.00)
GFR, Estimated: >60 mL/min (ref >60)
Glucose, Bld: 133 mg/dL — ABNORMAL HIGH (ref 70–99)
Potassium: 4.4 mmol/L (ref 3.5–5.1)
Sodium: 135 mmol/L (ref 135–145)
Total Bilirubin: 0.7 mg/dL (ref 0.3–1.2)
Total Protein: 7.2 g/dL (ref 6.5–8.1)

## 2021-04-11 ENCOUNTER — Encounter: Payer: Self-pay | Admitting: Hematology

## 2021-04-13 ENCOUNTER — Other Ambulatory Visit: Payer: Self-pay

## 2021-04-13 ENCOUNTER — Ambulatory Visit
Admission: RE | Admit: 2021-04-13 | Discharge: 2021-04-13 | Disposition: A | Discharge status: Home / Self Care | Payer: Medicare Other | Source: Ambulatory Visit | Attending: Orthopedic Surgery | Admitting: Orthopedic Surgery

## 2021-04-13 ENCOUNTER — Telehealth: Payer: Self-pay | Admitting: Hematology

## 2021-04-13 DIAGNOSIS — M25562 Pain in left knee: Secondary | ICD-10-CM

## 2021-04-13 DIAGNOSIS — G8929 Other chronic pain: Secondary | ICD-10-CM

## 2021-04-13 NOTE — Telephone Encounter (Signed)
Scheduled follow-up appointment per 6/3 los. Patient is aware. 

## 2021-04-14 ENCOUNTER — Ambulatory Visit (INDEPENDENT_AMBULATORY_CARE_PROVIDER_SITE_OTHER): Payer: Medicare Other

## 2021-04-14 DIAGNOSIS — I420 Dilated cardiomyopathy: Secondary | ICD-10-CM | POA: Diagnosis not present

## 2021-04-14 LAB — CUP PACEART REMOTE DEVICE CHECK
Battery Remaining Longevity: 121 mo
Battery Voltage: 3.01 V
Brady Statistic AP VP Percent: 35.11 %
Brady Statistic AP VS Percent: 0 %
Brady Statistic AS VP Percent: 64.87 %
Brady Statistic AS VS Percent: 0.02 %
Brady Statistic RA Percent Paced: 35 %
Brady Statistic RV Percent Paced: 99.98 %
Date Time Interrogation Session: 20220606215900
Implantable Lead Implant Date: 20200608
Implantable Lead Implant Date: 20200608
Implantable Lead Location: 753859
Implantable Lead Location: 753860
Implantable Lead Model: 5076
Implantable Lead Model: 5076
Implantable Pulse Generator Implant Date: 20200608
Lead Channel Impedance Value: 361 Ohm
Lead Channel Impedance Value: 380 Ohm
Lead Channel Impedance Value: 418 Ohm
Lead Channel Impedance Value: 437 Ohm
Lead Channel Pacing Threshold Amplitude: 0.75 V
Lead Channel Pacing Threshold Amplitude: 0.75 V
Lead Channel Pacing Threshold Pulse Width: 0.4 ms
Lead Channel Pacing Threshold Pulse Width: 0.4 ms
Lead Channel Sensing Intrinsic Amplitude: 15.375 mV
Lead Channel Sensing Intrinsic Amplitude: 15.375 mV
Lead Channel Sensing Intrinsic Amplitude: 3.125 mV
Lead Channel Sensing Intrinsic Amplitude: 3.125 mV
Lead Channel Setting Pacing Amplitude: 1.5 V
Lead Channel Setting Pacing Amplitude: 2.5 V
Lead Channel Setting Pacing Pulse Width: 0.4 ms
Lead Channel Setting Sensing Sensitivity: 2.8 mV

## 2021-04-15 ENCOUNTER — Telehealth: Payer: Self-pay

## 2021-04-15 NOTE — Telephone Encounter (Signed)
Patient called and spoke with this nurse stating that She is having the following side effects: muscle aches, SOB, wheezing, headaches, stomach aches, fatigue and pain to the sides of her knees.  Patient feels that all are coming from her Anastrozole and would like to know if she should stop taking them.  This nurse informed her that she would speak with providers and will follow up.  No further questions or concerns at this time.

## 2021-04-16 ENCOUNTER — Telehealth: Payer: Self-pay | Admitting: Hematology

## 2021-04-16 NOTE — Telephone Encounter (Signed)
I called pt back regarding her concerns (see phone message from yesterday). I suggest her to stop anastrozole, and see if her symptoms improve/resolves. She is glad to do that. She has been on Anastrozole for 3 years. I will do a phone visit with her in 3 months and may consider switching her to exemestane or tamoxifen then if she is willing. She will also f/u her orthopedics for her knee pain. All questions were answered.  Truitt Merle  04/16/2021

## 2021-04-28 ENCOUNTER — Telehealth: Payer: Self-pay | Admitting: Hematology

## 2021-04-28 NOTE — Telephone Encounter (Signed)
Sch per 6/16 sch msg, pt aware 

## 2021-05-06 NOTE — Progress Notes (Signed)
Remote pacemaker transmission.   

## 2021-06-17 ENCOUNTER — Emergency Department (HOSPITAL_BASED_OUTPATIENT_CLINIC_OR_DEPARTMENT_OTHER): Payer: Medicare Other

## 2021-06-17 ENCOUNTER — Emergency Department (HOSPITAL_BASED_OUTPATIENT_CLINIC_OR_DEPARTMENT_OTHER)
Admission: EM | Admit: 2021-06-17 | Discharge: 2021-06-17 | Disposition: A | Discharge status: Home / Self Care | Payer: Medicare Other | Attending: Emergency Medicine | Admitting: Emergency Medicine

## 2021-06-17 ENCOUNTER — Encounter (HOSPITAL_BASED_OUTPATIENT_CLINIC_OR_DEPARTMENT_OTHER): Payer: Self-pay

## 2021-06-17 ENCOUNTER — Other Ambulatory Visit: Payer: Self-pay

## 2021-06-17 DIAGNOSIS — I5022 Chronic systolic (congestive) heart failure: Secondary | ICD-10-CM | POA: Insufficient documentation

## 2021-06-17 DIAGNOSIS — Z853 Personal history of malignant neoplasm of breast: Secondary | ICD-10-CM | POA: Insufficient documentation

## 2021-06-17 DIAGNOSIS — Z8616 Personal history of COVID-19: Secondary | ICD-10-CM | POA: Insufficient documentation

## 2021-06-17 DIAGNOSIS — Z950 Presence of cardiac pacemaker: Secondary | ICD-10-CM | POA: Diagnosis not present

## 2021-06-17 DIAGNOSIS — I110 Hypertensive heart disease with heart failure: Secondary | ICD-10-CM | POA: Insufficient documentation

## 2021-06-17 DIAGNOSIS — Z91040 Latex allergy status: Secondary | ICD-10-CM | POA: Diagnosis not present

## 2021-06-17 DIAGNOSIS — M25562 Pain in left knee: Secondary | ICD-10-CM | POA: Diagnosis present

## 2021-06-17 DIAGNOSIS — R600 Localized edema: Secondary | ICD-10-CM | POA: Insufficient documentation

## 2021-06-17 DIAGNOSIS — M7122 Synovial cyst of popliteal space [Baker], left knee: Secondary | ICD-10-CM | POA: Insufficient documentation

## 2021-06-17 DIAGNOSIS — Z79899 Other long term (current) drug therapy: Secondary | ICD-10-CM | POA: Insufficient documentation

## 2021-06-17 DIAGNOSIS — J45909 Unspecified asthma, uncomplicated: Secondary | ICD-10-CM | POA: Diagnosis not present

## 2021-06-17 NOTE — ED Provider Notes (Signed)
[facility] EMERGENCY DEPARTMENT Provider Note   CSN: [id number] Arrival date & time: 06/17/21  1154     History Chief Complaint  Patient presents with   Leg Swelling    Meagan Ryan is a 65 y.o. female.  Patient presenting with 2 months history of left knee pain and discomfort.  She states that the pain behind her left knee.  She has been seeing orthopedist who ordered x-rays and ultrasound several months ago which were unremarkable.  Was advised to come to the ER today.  She denies any falls.  Denies any fevers or cough or vomiting or diarrhea.      Past Medical History:  Diagnosis Date   Allergic rhinitis    Asthma    Breast cancer ([place]) 1996   Left breast-stage II infiltrating ductal carcinoma, 3/8 axillary lymph nodes were positive for metastatic carcinoma, getting yearly mammogram and every other year MRI followed by oncologist Dr Benay Spice   Complication of anesthesia    WAKE UP WITH HEADACHE, difficulty waking up   DCM (dilated cardiomyopathy) ([place])    EF 55-60% by echo 04/2019   Dysuria 2011   Family history of adverse reaction to anesthesia    "family wakes up with personality changes, but are very short term"   Fatty liver CT 1/12   elevated LFT   Genetic testing 03/13/2018   Common cancers panel (47 genes) @ [facility] - No pathogenic mutations detected   H/O fibromyalgia    H/O osteopenia 2009   H/O peripheral neuropathy    H/O varicose veins    Hyperlipidemia    Hypertension    Incomplete RBBB    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  Intolerant to PAP therapy   Pelvic pain in female    PMB (postmenopausal bleeding) 2011   Pregnancy induced hypertension    Right-sided lacunar infarction [facility])    MRI 1.2012   Tubular adenoma of colon     Patient Active Problem List   Diagnosis Date Noted   Dyspnea 01/14/2020   Symptomatic advanced heart block  04/15/2019   COVID-19 virus infection 04/15/2019   History of left breast cancer 05/01/2018   Genetic testing 03/13/2018   Family history of breast cancer    Malignant neoplasm of upper-outer quadrant of right breast in female, estrogen receptor positive ([place]) 02/23/2018   Syncope 12/14/2017   Pain and swelling of right lower leg 10/28/2016   Right leg numbness [id number]   Chronic systolic CHF (congestive heart failure), NYHA class 2 ([place]) 02/26/2016   OSA (obstructive sleep apnea) 12/31/2015   History of breast cancer in female 10/30/2015   Ovarian cyst 10/10/2015   Bilateral ovarian cysts 10/09/2015   S/P oophorectomy 10/09/2015   Morbid obesity with BMI of 40.0-44.9, adult ([place]) 08/08/2015   Benign essential HTN 09/18/2014   RBBB 09/18/2014   Incomplete RBBB    DCM (dilated cardiomyopathy) ([place]) 04/24/2014   Meniere disease 06/02/2012    Past Surgical History:  Procedure Laterality Date   Gastroc muscle repair  2007   LAPAROSCOPIC CHOLECYSTECTOMY     MASTECTOMY  06/1995   Left - for infiltrating ductal carcinoma   MASTECTOMY W/ SENTINEL NODE BIOPSY Right 03/31/2018   MASTECTOMY W/ SENTINEL NODE BIOPSY Right 03/31/2018   Procedure: RIGHT TOTAL MASTECTOMY WITH AXILLARY SENTINEL LYMPH NODE BIOPSY ERAS PATHWAY;  Surgeon: Excell Seltzer,  MD;  Location: [facility];  Service: General;  Laterality: Right;   NEPHRECTOMY Right late 1990s   due to kidney shrinkage   PACEMAKER IMPLANT N/A 04/16/2019   Procedure: PACEMAKER IMPLANT;  Surgeon: Constance Haw, MD;  Location: [place] CV LAB;  Service: Cardiovascular;  Laterality: N/A;   ROBOTIC ASSISTED TOTAL HYSTERECTOMY WITH BILATERAL SALPINGO OOPHERECTOMY Bilateral 10/09/2015   Procedure: ROBOTIC ASSISTED BILATERAL SALPINGO OOPHORECTOMY ;  Surgeon: Everitt Amber, MD;  [facility];  Service: Gynecology;  Laterality: Bilateral;   Tram surgery  1996   TUBAL LIGATION       OB History     Gravida  1   Para  1   Term  1    Preterm      AB      Living  1      SAB      IAB      Ectopic      Multiple      Live Births  1           Family History  Problem Relation Age of Onset   Heart disease Mother        MI   Stroke Mother    Diabetes Mother    Hypertension Mother    Stroke Father    Prostate cancer Father 74       deceased 49   Colon cancer Sister 27       currently 11   Breast cancer Paternal Aunt 83       deceased 51s   Breast cancer Cousin        daughter of pat aunt w/ breast ca   Breast cancer Cousin        daughter of pat aunt w/ breast ca    Social History   Tobacco Use   Smoking status: Never   Smokeless tobacco: Never  Vaping Use   Vaping Use: Never used  Substance Use Topics   Alcohol use: No   Drug use: No    Home Medications Prior to Admission medications   Medication Sig Start Date End Date Taking? Authorizing Provider  ALPRAZolam Duanne Moron) 0.5 MG tablet Take 0.5 mg by mouth 2 (two) times daily.    [provider]  anastrozole (ARIMIDEX) 1 MG tablet TAKE 1 TABLET DAILY 04/13/21   Truitt Merle, MD  carvedilol (COREG) 12.5 MG tablet Take 1 tablet (12.5 mg total) by mouth 2 (two) times daily with a meal. 12/08/20   Turner, Eber Royann Wildasin, MD  cetirizine (ZYRTEC) 10 MG tablet Take 10 mg by mouth every morning.     [provider]  DULoxetine (CYMBALTA) 30 MG capsule Take 30 mg by mouth at bedtime.     [provider]  ELIQUIS 5 MG TABS tablet TAKE 1 TABLET TWICE A DAY 08/19/20   Sherran Needs, NP  fluticasone [facility]) 50 MCG/ACT nasal spray Place 1 spray into both nostrils every morning.  06/26/15   [provider]  gabapentin (NEURONTIN) 300 MG capsule Take 300 mg by mouth 2 (two) times daily. 02/14/16   [provider]  ivabradine (CORLANOR) 5 MG TABS tablet Take 1 tablet (5 mg total) by mouth 2 (two) times daily with a meal. Please make yearly appt with Dr. Radford Pax for September 2022 for future refills. Thank you 1st attempt  03/31/21   Sueanne Margarita, MD  niacin 500 MG tablet Take 500 mg by mouth daily.    [provider]  Omega-3 Fatty Acids (FISH OIL) 1000 MG CAPS Take 2,000 mg by mouth 2 (two) times daily.    [provider]  omeprazole (PRILOSEC) 20 MG capsule Take 20 mg by mouth daily. 12/29/15   [provider]  potassium chloride (K-DUR) 10 MEQ tablet Take 10 mEq by mouth daily.    [provider]  predniSONE (DELTASONE) 10 MG tablet Take 2 tablets (20 mg total) by mouth daily with breakfast. 08/21/20   Newt Minion, MD  predniSONE (DELTASONE) 10 MG tablet Take 1 tablet (10 mg total) by mouth daily with breakfast. 03/03/21   Newt Minion, MD  sacubitril-valsartan (ENTRESTO) 24-26 MG Take 1 tablet by mouth 2 (two) times daily. Please make overdue appt with Dr. Radford Pax for September 2022 for future refills. Thank you 1st attempt 03/31/21   Sueanne Margarita, MD  VITAMIN D PO Take 4,000 Units by mouth in the morning and at bedtime.     [provider]    Allergies    Amlodipine, Noroxin [norfloxacin], Cephalexin, Chocolate, Codeine, Sulfa antibiotics, Latex, and Penicillins  Review of Systems   Review of Systems  Constitutional:  Negative for fever.  HENT:  Negative for ear pain.   Eyes:  Negative for pain.  Respiratory:  Negative for cough.   Cardiovascular:  Negative for chest pain.  Gastrointestinal:  Negative for abdominal pain.  Genitourinary:  Negative for flank pain.  Musculoskeletal:  Negative for back pain.  Skin:  Negative for rash.  Neurological:  Negative for headaches.   Physical Exam Updated Vital Signs BP (!) 153/117 (BP Location: Right Wrist)   Pulse 60   Temp 98.2 F (36.8 C) (Oral)   Resp 20   Ht '5\' 4"'$  (1.626 m)   Wt 115.7 kg   SpO2 99%   BMI 43.77 kg/m   Physical Exam Constitutional:      General: She is not in acute distress.    Appearance: Normal appearance.  HENT:     Head: Normocephalic.     Nose: Nose normal.  Eyes:      Extraocular Movements: Extraocular movements intact.  Cardiovascular:     Rate and Rhythm: Normal rate.  Pulmonary:     Effort: Pulmonary effort is normal.  Musculoskeletal:        General: Normal range of motion.     Cervical back: Normal range of motion.     Comments: Bilateral lower extremities show no swelling or erythema on visual inspection.  No deformities noted.  Range of motion is normal.  Strength is 5/5 bilateral lower extremities.  Dorsalis pedis pulses are 2+ bilateral extremities.  No discoloration or tenderness on exam noted bilateral lower extremities.  Neurological:     General: No focal deficit present.     Mental Status: She is alert. Mental status is at baseline.    ED Results / Procedures / Treatments   Labs (all labs ordered are listed, but only abnormal results are displayed) Labs Reviewed - No data to display  EKG None  Radiology US Venous Img Lower Unilateral Left  Result Date: 06/17/2021 CLINICAL DATA:  Pain and swelling in the left lower extremity EXAM: LEFT LOWER EXTREMITY VENOUS DOPPLER ULTRASOUND TECHNIQUE: Gray-scale sonography with graded compression, as well as color Doppler and duplex ultrasound were performed to evaluate the lower extremity deep venous systems from the level of the common femoral vein and including the common femoral, femoral, profunda femoral, popliteal and calf veins including the posterior tibial, peroneal and gastrocnemius  veins when visible. The superficial great saphenous vein was also interrogated. Spectral Doppler was utilized to evaluate flow at rest and with distal augmentation maneuvers in the common femoral, femoral and popliteal veins. COMPARISON:  CT knee 04/13/2021 FINDINGS: Contralateral Common Femoral Vein: Respiratory phasicity is normal and symmetric with the symptomatic side. No evidence of thrombus. Normal compressibility. Common Femoral Vein: No evidence of thrombus. Normal compressibility, respiratory phasicity and  response to augmentation. Saphenofemoral Junction: No evidence of thrombus. Normal compressibility and flow on color Doppler imaging. Profunda Femoral Vein: No evidence of thrombus. Normal compressibility and flow on color Doppler imaging. Femoral Vein: No evidence of thrombus. Normal compressibility, respiratory phasicity and response to augmentation. Popliteal Vein: No evidence of thrombus. Normal compressibility, respiratory phasicity and response to augmentation. Calf Veins: Not well visualized. Superficial Great Saphenous Vein: No evidence of thrombus. Normal compressibility. Venous Reflux:  None. Other Findings: Elongated fluid collection within the deep soft tissues of the medial left calf measuring approximately 5.8 x 1.7 x 4.1 cm. IMPRESSION: 1. No evidence of left lower extremity deep venous thrombosis. Please note the calf veins were not well visualized. 2. Elongated fluid collection within the medial left calf measuring up to 5.8 cm in length. Etiology is uncertain but could potentially reflect inferior extension of a Baker's cyst. Electronically Signed   By: Davina Poke D.O.   On: 06/17/2021 13:31    Procedures Procedures   Medications Ordered in ED Medications - No data to display  ED Course  I have reviewed the triage vital signs and the nursing notes.  Pertinent labs & imaging results that were available during my care of the patient were reviewed by me and considered in my medical decision making (see chart for details).    MDM Rules/Calculators/A&P                           Ultrasound shows no evidence of DVT.  Have an elongated fluid collection in the left medial calf possible Baker's cyst per radiology.  Recommending outpatient follow-up with orthopedic surgery within the week.  Recommending immediate return for fevers worsening pain or any additional concerns.  Final Clinical Impression(s) / ED Diagnoses Final diagnoses:  Synovial cyst of left popliteal space     Rx / DC Orders ED Discharge Orders     None        Luna Fuse, MD 06/17/21 [phone]

## 2021-06-17 NOTE — ED Triage Notes (Signed)
Pt c/o pain to left LE x "months"-c/o left LE swelling x 2 days and SOB x 2 weeks-NAD-to triage in w/c

## 2021-06-17 NOTE — ED Notes (Signed)
Lower leg pain  For months left and swelling started this am , states some sob

## 2021-06-17 NOTE — ED Notes (Signed)
EMT checked patient in waiting room. SAT 96%, HR 71

## 2021-06-17 NOTE — Discharge Instructions (Addendum)
Call your primary care doctor or specialist as discussed in the next 2-3 days.   Return immediately back to the ER if:  Your symptoms worsen within the next 12-24 hours. You develop new symptoms such as new fevers, persistent vomiting, new pain, shortness of breath, or new weakness or numbness, or if you have any other concerns.  

## 2021-06-19 ENCOUNTER — Ambulatory Visit (INDEPENDENT_AMBULATORY_CARE_PROVIDER_SITE_OTHER): Payer: Medicare Other | Admitting: Orthopedic Surgery

## 2021-06-19 ENCOUNTER — Other Ambulatory Visit: Payer: Self-pay

## 2021-06-19 DIAGNOSIS — M1712 Unilateral primary osteoarthritis, left knee: Secondary | ICD-10-CM

## 2021-06-20 ENCOUNTER — Encounter: Payer: Self-pay | Admitting: Orthopedic Surgery

## 2021-06-20 DIAGNOSIS — M1712 Unilateral primary osteoarthritis, left knee: Secondary | ICD-10-CM

## 2021-06-20 MED ORDER — LIDOCAINE HCL 1 % IJ SOLN
5.0000 mL | INTRAMUSCULAR | Status: AC | PRN
  Administered 2021-06-20: 5 mL

## 2021-06-20 MED ORDER — METHYLPREDNISOLONE ACETATE 40 MG/ML IJ SUSP
40.0000 mg | INTRAMUSCULAR | Status: AC | PRN
Start: 2021-06-20 — End: 2021-06-20
  Administered 2021-06-20: 40 mg via INTRA_ARTICULAR

## 2021-06-20 MED ORDER — BUPIVACAINE HCL 0.25 % IJ SOLN
4.0000 mL | INTRAMUSCULAR | Status: AC | PRN
  Administered 2021-06-20: 4 mL via INTRA_ARTICULAR

## 2021-06-20 NOTE — Progress Notes (Signed)
Office Visit Note   Patient: Meagan Ryan           Date of Birth: 08-Jan-1956           MRN: [id number] Visit Date: 06/19/2021 Requested by: Kristen Loader, [address] [place],  [place] [address] PCP: Kristen Loader, FNP  Subjective: Chief Complaint  Patient presents with   Left Leg - Pain    HPI: Patient presents for evaluation of left leg pain of several months duration.  She reports progressive difficulty with weightbearing and diminished standing endurance.  Describes some swelling into the foot.  She has had a Doppler which was negative for DVT.  Went to the emergency department 2 days ago.  Hard for her to walk hard for her to bend.  This has been going on for 6 months.  Denies any history of injury.  Cannot have an MRI scan because of pacemaker.  She had an injection into the left knee in February from anterior approach which did not give too much relief.  She does describe some numbness and tingling in the left leg only.  Had MRI scan of the lumbar spine 4 and half years ago which showed some spondylosis and age-appropriate degenerative changes.  She also reports "bilateral hip pain" when leaning forward which is really more in the posterior superior buttock region.  She describes sensation of fluid running down her leg which is painful.              ROS: All systems reviewed are negative as they relate to the chief complaint within the history of present illness.  Patient denies  fevers or chills.   Assessment & Plan: Visit Diagnoses:  1. Arthritis of left knee     Plan: Impression is left leg with likely ruptured Baker's cyst based on ultrasound examination today.  The Baker's cyst itself is small and not palpable but is visible and can be seen on ultrasound and its about the size of a small walnut.  CT scan of the knee shows no definitive fracture and mild degenerative changes.  She does have an effusion in the left knee today which is not present in the right knee.   I think some of her symptoms could be coming from this ruptured Baker's cyst.  The other part of her symptoms could be coming from what ever is causing the effusion in the knee.  And I also believe that some of her symptoms could be from her back.  Aspiration of that small Baker's cyst not indicated but aspiration and injection of the left knee is indicated which is performed today.  We will see how she does with that procedure.  Could consider lumbar spine injection as a next step.  Follow-Up Instructions: Return if symptoms worsen or fail to improve.   Orders:  No orders of the defined types were placed in this encounter.  No orders of the defined types were placed in this encounter.     Procedures: Large Joint Inj: L knee on 06/20/2021 6:39 PM Indications: diagnostic evaluation, joint swelling and pain Details: 18 G 1.5 in needle, superolateral approach  Arthrogram: No  Medications: 5 mL lidocaine 1 %; 40 mg methylPREDNISolone acetate 40 MG/ML; 4 mL bupivacaine 0.25 % Outcome: tolerated well, no immediate complications Procedure, treatment alternatives, risks and benefits explained, specific risks discussed. Consent was given by the patient. Immediately prior to procedure a time out was called to verify the correct patient, procedure,  equipment, support staff and site/side marked as required. Patient was prepped and draped in the usual sterile fashion.      Clinical Data: No additional findings.  Objective: Vital Signs: There were no vitals taken for this visit.  Physical Exam:   Constitutional: Patient appears well-developed HEENT:  Head: Normocephalic Eyes:EOM are normal Neck: Normal range of motion Cardiovascular: Normal rate Pulmonary/chest: Effort normal Neurologic: Patient is alert Skin: Skin is warm Psychiatric: Patient has normal mood and affect   Ortho Exam: Ortho exam demonstrates mild edema bilateral lower extremities with some weakness to hip flexion 5- out  of 5 on the left compared to 5+ out of 5 on the right.  No groin pain with internal ex rotation of the leg.  She has palpable pedal pulses.  Cannot palpate any posterior mass in the right or left knee.  She has pretty reasonable range of motion of that left knee with about a 5 degree flexion contracture with stable collateral cruciate ligaments.  Mild effusion is present on the left which is not present on the right.  Aspiration of the knee did retrieve about 20 cc of fluid with small cartilage flakes present.  Specialty Comments:  No specialty comments available.  Imaging: No results found.   PMFS History: Patient Active Problem List   Diagnosis Date Noted   Dyspnea 01/14/2020   Symptomatic advanced heart block 04/15/2019   COVID-19 virus infection 04/15/2019   History of left breast cancer 05/01/2018   Genetic testing 03/13/2018   Family history of breast cancer    Malignant neoplasm of upper-outer quadrant of right breast in female, estrogen receptor positive ([place]) 02/23/2018   Syncope 12/14/2017   Pain and swelling of right lower leg 10/28/2016   Right leg numbness [id number]   Chronic systolic CHF (congestive heart failure), NYHA class 2 ([place]) 02/26/2016   OSA (obstructive sleep apnea) 12/31/2015   History of breast cancer in female 10/30/2015   Ovarian cyst 10/10/2015   Bilateral ovarian cysts 10/09/2015   S/P oophorectomy 10/09/2015   Morbid obesity with BMI of 40.0-44.9, adult ([place]) 08/08/2015   Benign essential HTN 09/18/2014   RBBB 09/18/2014   Incomplete RBBB    DCM (dilated cardiomyopathy) ([place]) 04/24/2014   Meniere disease 06/02/2012   Past Medical History:  Diagnosis Date   Allergic rhinitis    Asthma    Breast cancer ([place]) 1996   Left breast-stage II infiltrating ductal carcinoma, 3/8 axillary lymph nodes were positive for metastatic carcinoma, getting yearly mammogram and every other year MRI followed by oncologist Dr Benay Spice   Complication of anesthesia     WAKE UP WITH HEADACHE, difficulty waking up   DCM (dilated cardiomyopathy) ([place])    EF 55-60% by echo 04/2019   Dysuria 2011   Family history of adverse reaction to anesthesia    "family wakes up with personality changes, but are very short term"   Fatty liver CT 1/12   elevated LFT   Genetic testing 03/13/2018   Common cancers panel (47 genes) @ [facility] - No pathogenic mutations detected   H/O fibromyalgia    H/O osteopenia 2009   H/O peripheral neuropathy    H/O varicose veins    Hyperlipidemia    Hypertension    Incomplete RBBB    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  Intolerant to PAP therapy   Pelvic pain  in female    PMB (postmenopausal bleeding) 2011   Pregnancy induced hypertension    Right-sided lacunar infarction [facility])    MRI 1.2012   Tubular adenoma of colon     Family History  Problem Relation Age of Onset   Heart disease Mother        MI   Stroke Mother    Diabetes Mother    Hypertension Mother    Stroke Father    Prostate cancer Father 67       deceased 62   Colon cancer Sister 6       currently 46   Breast cancer Paternal Aunt 20       deceased 81s   Breast cancer Cousin        daughter of pat aunt w/ breast ca   Breast cancer Cousin        daughter of pat aunt w/ breast ca    Past Surgical History:  Procedure Laterality Date   Gastroc muscle repair  2007   LAPAROSCOPIC CHOLECYSTECTOMY     MASTECTOMY  06/1995   Left - for infiltrating ductal carcinoma   MASTECTOMY W/ SENTINEL NODE BIOPSY Right 03/31/2018   MASTECTOMY W/ SENTINEL NODE BIOPSY Right 03/31/2018   Procedure: RIGHT TOTAL MASTECTOMY WITH AXILLARY SENTINEL LYMPH NODE BIOPSY ERAS PATHWAY;  Surgeon: Excell Seltzer, MD;  Location: [place] [facility];  Service: General;  Laterality: Right;   NEPHRECTOMY Right late 1990s   due to kidney shrinkage   PACEMAKER IMPLANT N/A 04/16/2019   Procedure: PACEMAKER  IMPLANT;  Surgeon: Constance Haw, MD;  Location: [place] CV LAB;  Service: Cardiovascular;  Laterality: N/A;   ROBOTIC ASSISTED TOTAL HYSTERECTOMY WITH BILATERAL SALPINGO OOPHERECTOMY Bilateral 10/09/2015   Procedure: ROBOTIC ASSISTED BILATERAL SALPINGO OOPHORECTOMY ;  Surgeon: Everitt Amber, MD;  [facility];  Service: Gynecology;  Laterality: Bilateral;   Tram surgery  1996   TUBAL LIGATION     Social History   Occupational History   Not on file  Tobacco Use   Smoking status: Never   Smokeless tobacco: Never  Vaping Use   Vaping Use: Never used  Substance and Sexual Activity   Alcohol use: No   Drug use: No   Sexual activity: Not on file

## 2021-06-29 ENCOUNTER — Other Ambulatory Visit: Payer: Self-pay | Admitting: Cardiology

## 2021-06-30 ENCOUNTER — Other Ambulatory Visit: Payer: Self-pay | Admitting: *Deleted

## 2021-06-30 MED ORDER — ENTRESTO 24-26 MG PO TABS: 1.0000 | ORAL_TABLET | Freq: Two times a day (BID) | ORAL | 0 refills | Status: DC

## 2021-07-07 ENCOUNTER — Ambulatory Visit (INDEPENDENT_AMBULATORY_CARE_PROVIDER_SITE_OTHER): Payer: Medicare Other | Admitting: Cardiology

## 2021-07-07 ENCOUNTER — Other Ambulatory Visit: Payer: Self-pay

## 2021-07-07 ENCOUNTER — Encounter: Payer: Self-pay | Admitting: Cardiology

## 2021-07-07 VITALS — BP 112/78 | HR 65 | Ht 64.0 in | Wt 250.6 lb

## 2021-07-07 DIAGNOSIS — R0609 Other forms of dyspnea: Secondary | ICD-10-CM

## 2021-07-07 DIAGNOSIS — I441 Atrioventricular block, second degree: Secondary | ICD-10-CM

## 2021-07-07 DIAGNOSIS — I5032 Chronic diastolic (congestive) heart failure: Secondary | ICD-10-CM

## 2021-07-07 DIAGNOSIS — I420 Dilated cardiomyopathy: Secondary | ICD-10-CM | POA: Diagnosis not present

## 2021-07-07 DIAGNOSIS — I10 Essential (primary) hypertension: Secondary | ICD-10-CM | POA: Diagnosis not present

## 2021-07-07 DIAGNOSIS — I480 Paroxysmal atrial fibrillation: Secondary | ICD-10-CM

## 2021-07-07 DIAGNOSIS — R0600 Dyspnea, unspecified: Secondary | ICD-10-CM | POA: Diagnosis not present

## 2021-07-07 DIAGNOSIS — G4733 Obstructive sleep apnea (adult) (pediatric): Secondary | ICD-10-CM

## 2021-07-07 DIAGNOSIS — R072 Precordial pain: Secondary | ICD-10-CM

## 2021-07-07 DIAGNOSIS — R5382 Chronic fatigue, unspecified: Secondary | ICD-10-CM

## 2021-07-07 LAB — BASIC METABOLIC PANEL
BUN/Creatinine Ratio: 12 (ref 12–28)
BUN: 11 mg/dL (ref 8–27)
CO2: 22 mmol/L (ref 20–29)
Calcium: 9.8 mg/dL (ref 8.7–10.3)
Chloride: 100 mmol/L (ref 96–106)
Creatinine, Ser: 0.92 mg/dL (ref 0.57–1.00)
Glucose: 113 mg/dL — ABNORMAL HIGH (ref 65–99)
Potassium: 4.7 mmol/L (ref 3.5–5.2)
Sodium: 136 mmol/L (ref 134–144)
eGFR: 69 mL/min/{1.73_m2} (ref >59)

## 2021-07-07 MED ORDER — APIXABAN 5 MG PO TABS: 5.0000 mg | ORAL_TABLET | Freq: Two times a day (BID) | ORAL | 3 refills | Status: DC

## 2021-07-07 MED ORDER — CARVEDILOL 12.5 MG PO TABS: 12.5000 mg | ORAL_TABLET | Freq: Two times a day (BID) | ORAL | 2 refills | Status: DC

## 2021-07-07 MED ORDER — IVABRADINE HCL 5 MG PO TABS: 5.0000 mg | ORAL_TABLET | Freq: Two times a day (BID) | ORAL | 0 refills | Status: DC

## 2021-07-07 NOTE — Patient Instructions (Addendum)
Medication Instructions:  Your physician recommends that you continue on your current medications as directed. Please refer to the Current Medication list given to you today.  *If you need a refill on your cardiac medications before your next appointment, please call your pharmacy*  Labs: TODAY: BMET  Testing/Procedures: Your physician has requested that you have an echocardiogram. Echocardiography is a painless test that uses sound waves to create images of your heart. It provides your doctor with information about the size and shape of your heart and how well your heart's chambers and valves are working. This procedure takes approximately one hour. There are no restrictions for this procedure.  Your physician has requested that you have a coronary CTA scan. Please see below for further instructions.     Follow-Up: At [facility], you and your health needs are our priority.  As part of our continuing mission to provide you with exceptional heart care, we have created designated Provider Care Teams.  These Care Teams include your primary Cardiologist (physician) and Advanced Practice Providers (APPs -  Physician Assistants and Nurse Practitioners) who all work together to provide you with the care you need, when you need it.  Your next appointment:   1 year(s)  The format for your next appointment:   In Person  Provider:   You may see Loreta Ave, MD or one of the following Advanced Practice Providers on your designated Care Team:   Melina Copa, PA-C Ermalinda Barrios, PA-C   Other Instructions You have been referred to the Healthy Weight and Wellness Program      Your cardiac CT will be scheduled at:   [facility] [address] [place], [place] [address] [phone]  Please arrive at the [facility] main entrance (entrance A) of [facility] 30 minutes prior to test start time. Proceed to the [facility] Radiology Department (first floor) to check-in  and test prep.  Please follow these instructions carefully (unless otherwise directed):  On the Night Before the Test: Be sure to Drink plenty of water. Do not consume any caffeinated/decaffeinated beverages or chocolate 12 hours prior to your test. Do not take any antihistamines 12 hours prior to your test.   On the Day of the Test: Drink plenty of water until 1 hour prior to the test. Do not eat any food 4 hours prior to the test. You may take your regular medications prior to the test.  FEMALES- please wear underwire-free bra if available, avoid dresses & tight clothing      After the Test: Drink plenty of water. After receiving IV contrast, you may experience a mild flushed feeling. This is normal. On occasion, you may experience a mild rash up to 24 hours after the test. This is not dangerous. If this occurs, you can take Benadryl 25 mg and increase your fluid intake. If you experience trouble breathing, this can be serious. If it is severe call 911 IMMEDIATELY. If it is mild, please call our office. If you take any of these medications: Glipizide/Metformin, Avandament, Glucavance, please do not take 48 hours after completing test unless otherwise instructed.  Please allow 2-4 weeks for scheduling of routine cardiac CTs. Some insurance companies require a pre-authorization which may delay scheduling of this test.   For non-scheduling related questions, please contact the cardiac imaging nurse navigator should you have any questions/concerns: Marchia Bond, Cardiac Imaging Nurse Navigator Gordy Clement, Cardiac Imaging Nurse Navigator [facility] Direct Office Dial:  [phone]   For scheduling needs, including cancellations and rescheduling, please call [place], [phone].

## 2021-07-07 NOTE — Progress Notes (Signed)
Date:  07/07/2021   ID:  Meagan Ryan, DOB 1956/04/04, MRN [id number]   PCP:  Kristen Loader, FNP  Cardiologist:  Fransico Him, MD Sleep Medicine:  Fransico Him MD Electrophysiologist:  Constance Haw, MD   Chief Complaint:  HTN  History of Present Illness:    Meagan Ryan is a 65 y.o. 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  She has h/o breast cancer with recurrence.  Her cardiomyopathy was diagnosed in October 2016.  Echo at that time showed reduced EF down to 30 to 35%.  Subsequently, she underwent further ischemic evaluation with a coronary CTA with morphology done on September 04, 2015.  This showed a coronary calcium score of 0.  Normal coronary origin with right dominance.  There was no evidence of CAD, making the diagnosis of nonischemic cardiomyopathy.  She was placed on medical therapy. She has been on a BB and Entresto along with Corlanor. She had a repeat echocardiogram December 14, 2017 which showed improvement in EF to 50 to 55%.  Grade 2 diastolic dysfunction was also noted.  Her valvular function was normal.     She was admitted 04/2019 with COVID 19 complicated by symptomatic bradycardia with second degree Type 2 HB and intermittent CHB.  Carvedilol was stopped.  She underwent PPM by Dr. Curt Bears.  She was seen back 11/202 and was doing well except for fatigue and lack of energy since COVID.  It was felt sx were due to deconditioning and residual effects from COVID.  Her Carvedilol was restarted at 6.'25mg'$  BID due to HTN.    I saw her 12/2019 and she was still complaining of fatigue.  Dr. Curt Bears adjusted her PPM settings but this did not help.  She thought the ongoing fatigue was due to COVID.  She also had a lingering cough at night and DOE since her COVID infection even with doing housework.  A 2D echo was normal and she was referred to Dr. Lamonte Sakai with Pulmonary who felt her sx were post-COVID related.  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  She is here today for followup and continues to be weak.  She also has had some chest pressure that is nonexertional.  It usually lasts about a minute and resolves.  There is no radiation and by her description is more near where her breast surgery was but has been going on for a few month.  She does get diaphoretic with the pain but no nausea.  It will take her breath away.  Sometimes it is sharp and sometimes it is a pressure. She denies any LE edema, palpitations, dizziness or syncope.     Prior CV studies:   The following studies were reviewed today: none  Past Medical History:  Diagnosis Date   Allergic rhinitis    Asthma    Breast cancer ([place]) 1996   Left breast-stage II infiltrating ductal carcinoma, 3/8 axillary lymph nodes were positive for metastatic carcinoma, getting yearly mammogram and every other year MRI followed by oncologist Dr Benay Spice   Complication of anesthesia    WAKE UP WITH HEADACHE, difficulty waking up   DCM (dilated cardiomyopathy) ([place])    EF 55-60% by echo 04/2019   Dysuria 2011   Family history of adverse reaction to anesthesia    "family wakes up with personality changes, but are very short term"  Fatty liver CT 1/12   elevated LFT   Genetic testing 03/13/2018   Common cancers panel (47 genes) @ [facility] - No pathogenic mutations detected   H/O fibromyalgia    H/O osteopenia 2009   H/O peripheral neuropathy    H/O varicose veins    Hyperlipidemia    Hypertension    Incomplete RBBB    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  Intolerant to PAP therapy   Pelvic pain in female    PMB (postmenopausal bleeding) 2011   Pregnancy induced hypertension    Right-sided lacunar infarction [facility])    MRI 1.2012   Tubular adenoma of colon    Past Surgical  History:  Procedure Laterality Date   Gastroc muscle repair  2007   LAPAROSCOPIC CHOLECYSTECTOMY     MASTECTOMY  06/1995   Left - for infiltrating ductal carcinoma   MASTECTOMY W/ SENTINEL NODE BIOPSY Right 03/31/2018   MASTECTOMY W/ SENTINEL NODE BIOPSY Right 03/31/2018   Procedure: RIGHT TOTAL MASTECTOMY WITH AXILLARY SENTINEL LYMPH NODE BIOPSY ERAS PATHWAY;  Surgeon: Excell Seltzer, MD;  Location: [place];  Service: General;  Laterality: Right;   NEPHRECTOMY Right late 1990s   due to kidney shrinkage   PACEMAKER IMPLANT N/A 04/16/2019   Procedure: PACEMAKER IMPLANT;  Surgeon: Constance Haw, MD;  Location: [place] CV [facility];  Service: Cardiovascular;  Laterality: N/A;   ROBOTIC ASSISTED TOTAL HYSTERECTOMY WITH BILATERAL SALPINGO OOPHERECTOMY Bilateral 10/09/2015   Procedure: ROBOTIC ASSISTED BILATERAL SALPINGO OOPHORECTOMY ;  Surgeon: Everitt Amber, MD;  Location: [facility];  Service: Gynecology;  Laterality: Bilateral;   Tram surgery  1996   TUBAL LIGATION       Current Meds  Medication Sig   ALPRAZolam (XANAX) 0.5 MG tablet Take 0.5 mg by mouth 2 (two) times daily.   anastrozole (ARIMIDEX) 1 MG tablet TAKE 1 TABLET DAILY   carvedilol (COREG) 12.5 MG tablet Take 1 tablet (12.5 mg total) by mouth 2 (two) times daily with a meal.   cetirizine (ZYRTEC) 10 MG tablet Take 10 mg by mouth every morning.    DULoxetine (CYMBALTA) 60 MG capsule Take 60 mg by mouth daily.   ELIQUIS 5 MG TABS tablet TAKE 1 TABLET TWICE A DAY   fluticasone (FLONASE) 50 MCG/ACT nasal spray Place 1 spray into both nostrils every morning.    ivabradine (CORLANOR) 5 MG TABS tablet Take 1 tablet (5 mg total) by mouth 2 (two) times daily.   niacin 500 MG tablet Take 500 mg by mouth daily.   Omega-3 Fatty Acids (FISH OIL) 1000 MG CAPS Take 2,000 mg by mouth 2 (two) times daily.   omeprazole (PRILOSEC) 20 MG capsule Take 20 mg by mouth daily.   potassium chloride (K-DUR) 10 MEQ tablet Take 10 mEq by mouth daily.    predniSONE (DELTASONE) 10 MG tablet Take 1 tablet (10 mg total) by mouth daily with breakfast.   sacubitril-valsartan (ENTRESTO) 24-26 MG Take 1 tablet by mouth 2 (two) times daily.   VITAMIN D PO Take 4,000 Units by mouth in the morning and at bedtime.      Allergies:   Amlodipine, Noroxin [norfloxacin], Cephalexin, Chocolate, Codeine, Sulfa antibiotics, Latex, and Penicillins   Social History   Tobacco Use   Smoking status: Never   Smokeless tobacco: Never  Vaping Use   Vaping Use: Never used  Substance Use  Topics   Alcohol use: No   Drug use: No     Family Hx: The patient's family history includes Breast cancer in her cousin and cousin; Breast cancer (age of onset: 62) in her paternal aunt; Colon cancer (age of onset: 62) in her sister; Diabetes in her mother; Heart disease in her mother; Hypertension in her mother; Prostate cancer (age of onset: 9) in her father; Stroke in her father and mother.  ROS:   Please see the history of present illness.     All other systems reviewed and are negative.   Labs/Other Tests and Data Reviewed:    Recent Labs: 07/10/2020: TSH 5.440 04/10/2021: ALT 11; BUN 10; Creatinine 0.92; Hemoglobin 13.2; Platelet Count 283; Potassium 4.4; Sodium 135   Recent Lipid Panel Lab Results  Component Value Date/Time   TRIG 110 04/15/2019 10:42 AM    Wt Readings from Last 3 Encounters:  07/07/21 250 lb 9.6 oz (113.7 kg)  06/17/21 255 lb (115.7 kg)  04/10/21 252 lb 9.6 oz (114.6 kg)     Objective:    Vital Signs:  BP 112/78   Pulse 65   Ht '5\' 4"'$  (1.626 m)   Wt 250 lb 9.6 oz (113.7 kg)   SpO2 97%   BMI 43.02 kg/m   GEN: Well nourished, well developed in no acute distress HEENT: Normal NECK: No JVD; No carotid bruits LYMPHATICS: No lymphadenopathy CARDIAC:RRR, no murmurs, rubs, gallops RESPIRATORY:  Clear to auscultation without rales, wheezing or rhonchi  ABDOMEN: Soft, non-tender, non-distended MUSCULOSKELETAL:  No edema; No deformity   SKIN: Warm and dry NEUROLOGIC:  Alert and oriented x 3 PSYCHIATRIC:  Normal affect    EKG was performed today and showed NSR with LBBB  ASSESSMENT & PLAN:    1.  DCM -felt secondary to chemo -initial EF 30-35% but normalized to 55-60% on echo 04/2019 -repeat 2D echo 12/2019 showed normal LVF with EF 55-60% and G1DD  -She is NYHA class 2b and is now complaining of more fatigue which I suspect is related to post COVID, obesity and sedentary state -I will repeat  2D echo to make sure LVF has not changed -she Continue prescription drug management with Corlanor '5mg'$  BID, Entresto 24-'26mg'$  BID and Carvedilol 12.'5mg'$  BID>refilled -I have personally reviewed and interpreted outside labs performed by patient's PCP which showed SCr 0.92, Hbg 13.2 in June 2022   2  HTN -Bp controlled on exam today -Continue prescription drug management with Carvedilol 12.'5mg'$  BID and Entresto 24-'26mg'$  BID>refilled    3.  Chronic diastolic CHF -she does not appear volume overloaded on exam today -continue BB -she has not required diuretics    4.   DOE/Fatigue -this started after her COVID infection in June 2021 -HR Chest CT with post COVID changes -SOB has improved since I saw her last and pulmonary felt due to COVID and obesity -no OSA on HST -repeat 2D echo showed normal LVF   5.  Morbid Obesity -her fatigue post COVID continues -I have encouraged her to get into a routine exercise program and cut back on carbs and portions.  -refer to Healthy Weight and Wellness Program at [facility]   6.  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 Curt Bears  7. PAF -she is maintaining NSR on exam and denies any palpitations -I have personally reviewed and interpreted outside labs performed by patient's PCP which showed Hbg 13.2 in June 2022  and SCr 0.92  -Continue  prescription drug management with Carvedilol 12.'5mg'$  BID and Eliquis '5mg'$  BID>refilled  -followed in afib clinic  8.   OSA -was mild in the past and intolerant to treatment>>repeat study by pulmonary showed on OSA  9.  Chronic Fatigue -this started after infection with COVID 19 and has not improved -2D echo showed normal LVF -CBC was normal in April 2022 and TSH normal in Sept 2021 -per PCP  10.  Chest pain -she had a normal coronary CTA in 2016 -now with chest pain associated with diaphoresis -I will repeat coronary CTA to reassess for CAD    Medication Adjustments/Labs and Tests Ordered: Current medicines are reviewed at length with the patient today.  Concerns regarding medicines are outlined above.  Tests Ordered: Orders Placed This Encounter  Procedures   EKG 12-Lead    Medication Changes: No orders of the defined types were placed in this encounter.   Disposition:  Follow up in 6 month(s)  Signed, Fransico Him, MD  07/07/2021 9:37 AM    [facility]

## 2021-07-07 NOTE — Addendum Note (Signed)
Addended by: Antonieta Iba on: 07/07/2021 09:56 AM   Modules accepted: Orders

## 2021-07-14 ENCOUNTER — Ambulatory Visit (INDEPENDENT_AMBULATORY_CARE_PROVIDER_SITE_OTHER): Payer: Medicare Other

## 2021-07-14 DIAGNOSIS — I420 Dilated cardiomyopathy: Secondary | ICD-10-CM

## 2021-07-14 LAB — CUP PACEART REMOTE DEVICE CHECK
Battery Remaining Longevity: 116 mo
Battery Voltage: 3 V
Brady Statistic AP VP Percent: 44 %
Brady Statistic AP VS Percent: 0 %
Brady Statistic AS VP Percent: 56 %
Brady Statistic AS VS Percent: 0.01 %
Brady Statistic RA Percent Paced: 43.93 %
Brady Statistic RV Percent Paced: 99.99 %
Date Time Interrogation Session: 20220906010209
Implantable Lead Implant Date: 20200608
Implantable Lead Implant Date: 20200608
Implantable Lead Location: 753859
Implantable Lead Location: 753860
Implantable Lead Model: 5076
Implantable Lead Model: 5076
Implantable Pulse Generator Implant Date: 20200608
Lead Channel Impedance Value: 342 Ohm
Lead Channel Impedance Value: 380 Ohm
Lead Channel Impedance Value: 380 Ohm
Lead Channel Impedance Value: 437 Ohm
Lead Channel Pacing Threshold Amplitude: 0.75 V
Lead Channel Pacing Threshold Amplitude: 0.875 V
Lead Channel Pacing Threshold Pulse Width: 0.4 ms
Lead Channel Pacing Threshold Pulse Width: 0.4 ms
Lead Channel Sensing Intrinsic Amplitude: 15.375 mV
Lead Channel Sensing Intrinsic Amplitude: 15.375 mV
Lead Channel Sensing Intrinsic Amplitude: 2.75 mV
Lead Channel Sensing Intrinsic Amplitude: 2.75 mV
Lead Channel Setting Pacing Amplitude: 1.5 V
Lead Channel Setting Pacing Amplitude: 2.5 V
Lead Channel Setting Pacing Pulse Width: 0.4 ms
Lead Channel Setting Sensing Sensitivity: 2.8 mV

## 2021-07-16 ENCOUNTER — Inpatient Hospital Stay: Payer: Medicare Other | Attending: Hematology | Admitting: Hematology

## 2021-07-16 DIAGNOSIS — C50411 Malignant neoplasm of upper-outer quadrant of right female breast: Secondary | ICD-10-CM | POA: Diagnosis not present

## 2021-07-16 DIAGNOSIS — Z170 Estrogen receptor positive status [ER+]: Secondary | ICD-10-CM

## 2021-07-16 NOTE — Progress Notes (Signed)
[place]   Telephone:([phone]) [phone] [phone]) [phone]   Clinic Follow up Note   Patient Care Team: Kristen Loader, FNP as PCP - General (Family Medicine) Constance Haw, MD as PCP - Electrophysiology (Cardiology) Sueanne Margarita, MD as Consulting Physician (Cardiology) Gery Pray, MD as Consulting Physician (Radiation Oncology) Excell Seltzer, MD (Inactive) as Consulting Physician (General Surgery) Truitt Merle, MD as Consulting Physician (Hematology) Delice Bison Charlestine Massed, NP as Nurse Practitioner (Hematology and Oncology)  Date of Service:  07/16/2021  I connected with Meagan Ryan on 07/16/2021 at 10:20 AM EDT by telephone visit and verified that I am speaking with the correct person using two identifiers.  I discussed the limitations, risks, security and privacy concerns of performing an evaluation and management service by telephone and the availability of in person appointments. I also discussed with the patient that there may be a patient responsible charge related to this service. The patient expressed understanding and agreed to proceed.   Other persons participating in the visit and their role in the encounter:  none  Patient's location:  "on the side of the road" Provider's location:  my office  CHIEF COMPLAINT: f/u of right breast cancer  CURRENT THERAPY:  Anastrozole 66m daily, started 04/2018. Held since 04/2021 due to side effects, restarted 07/2021  ASSESSMENT & PLAN:  Meagan HUERTASis a 65y.o. female with   1. Malignant neoplasm of upper-outer quadrant of right breast, Invasive Ductal Carcinoma and DCIS, pT1bN0M0 stage IA, grade I, ER/PR positive, HER2 negative -Diagnosed in 12/2015. Treated with right mastectomy. Currently on anastrozole 1 mg daily, started in 04/2018.  -We opted to hold her anastrozole in 04/2021 due to side effects. She did not notice much of a difference since being off of it. I offered to change her to tamoxifen or  exemestane, but she prefers to stay on the anastrozole. -Will continue Surveillance. She had b/l mastectomies, no need for mammograms.  -Continue anastrozole  -F/u in 3 months with NP Lacie as scheduled   2. Previous left breast cancer in 1996, s/p left mastectomy, chemo, reconstruction surgery and Evista -No evidence of recurrence. Currently on Anastrozole for right breast cancer.   3. Obesity with weight gain, OSA and dilated cardiomyopathy  -f/u with PCP and cardiologist Dr. TRadford Pax -She is gaining weight and upset about it.  -Her LL neuropathy and Menire disease limit her ability to exercise.  -Her weight is currently maintained, but she struggles with losing weight.  -I previously discussed diet and exercise with her at length. I previously offered her the chance to attend [facility] weight management clinic. She declined.     4. COVID-19 infection in May 2020 -She tested positive in late 03/2019. Her whole family had COVID and they all survived.  -Her CLOVFI-43course was complicated by heart block and she was hospitalized. She had pacemaker placed on 04/16/19. She has since recovered.  -She continues to have residual fatigue, cough and SOB since her COVID infection. She will continue to f/u with her PCP, cardiologist.   5. Left knee and leg pain  -Since end of 2021, she has had left medial knee pain that radiates down her leg to foot.  -She was found to have a ruptured Baker's cyst in her left popliteal space on 06/17/21. She also had an effusion of the left knee, which was drained on 06/19/21. She reports she is "finally able to walk again."  6.  Nonischemic cardiomyopathy, secondary to chemotherapy  and hypertension -Diagnosed in October 2016, EF to 35% at that time.  Cardiac cath was negative. -Repeated echo in 2021 showed improved EF 55 to 60% -Follow-up with cardiology Dr. Radford Pax   Plan  -restart Anastrozole  -Lab and f/u in 3 months with NP Lacie (scheduled)    No  problem-specific Assessment & Plan notes found for this encounter.    SUMMARY OF ONCOLOGIC HISTORY: Oncology History Overview Note  Cancer Staging Malignant neoplasm of upper-outer quadrant of right breast in female, estrogen receptor positive ([place]) Staging form: Breast, AJCC 8th Edition - Clinical stage from 02/21/2018: Stage IA (cT1b, cN0, cM0, G1, ER+, PR+, HER2-) - Signed by Truitt Merle, MD on 03/01/2018     Malignant neoplasm of upper-outer quadrant of right breast in female, estrogen receptor positive ([place])  12/25/2015 Imaging   12/25/2015 Digital Screen Unilat R IMPRESSION: Further evaluation is suggested for possible distortion in the right breast.   01/02/2016 Imaging   01/02/2016 MM DIAG Breast TOMO Uni Right IMPRESSION: Further evaluation is suggested for possible distortion in the right breast.   01/12/2017 Imaging   01/12/2017 MM Digital Screening Unilat R IMPRESSION: No mammographic evidence of malignancy. A result letter of this screening mammogram will be mailed directly to the patient.   02/07/2018 Imaging   02/07/2018 MM 3D Screen Breast UNI Right IMPRESSION: Further evaluation is suggested for possible mass in the right breast.    02/16/2018 Mammogram   IMPRESSION: Irregular hypoechoic mass in the RIGHT breast at the 12 o'clock axis, retroareolar, measuring 7 mm, corresponding to the new mass seen on mammogram. This is a suspicious finding for which ultrasound-guided biopsy is recommended.   02/21/2018 Initial Biopsy   Diagnosis 02/21/18 Breast, right, needle core biopsy, 12 o'clock - INVASIVE DUCTAL CARCINOMA. - DUCTAL CARCINOMA IN SITU. - SEE COMMENT.   02/21/2018 Receptors her2   Prognostic indicators significant for: ER, 100% positive and PR, 100% positive or negative, both with strong staining intensity. Proliferation marker Ki67 at 5%. HER2 negative.   02/21/2018 Cancer Staging   Staging form: Breast, AJCC 8th Edition - Clinical stage from 02/21/2018:  Stage IA (cT1b, cN0, cM0, G1, ER+, PR+, HER2-) - Signed by Truitt Merle, MD on 03/01/2018   02/21/2018 Imaging   02/21/2018 [place] RT Breast BX W LOC DEV 1st Lesion IMG BX SPEC US Guide IMPRESSION: Ultrasound-guided biopsy of the RIGHT breast mass at the 12 o'clock axis. No apparent complications   2/83/6629 Initial Diagnosis   Malignant neoplasm of upper-outer quadrant of right breast in female, estrogen receptor positive ([place])   03/31/2018 Surgery   03/31/2018 Right total mastectomy 65 year old female with a new diagnosis of cancer of the right breast, upper outer quadrant. Clinical stage one a, ER positive, PR positive, HER-2 negative.  She has a remote history of left breast cancer treated with modified mastectomy and TRAM flap reconstruction in the 1990s.  After discussion of treatment options and risks extensively detailed elsewhere she has elected to proceed with right total mastectomy and axillary sentinel lymph node biopsy as initial surgical therapy.   03/31/2018 Pathology Results   03/31/2018 Diagnosis 1. Breast, simple mastectomy, Right Total - INVASIVE AND IN SITU DUCTAL CARCINOMA, 0.6 CM. - MARGINS NOT INVOLVED. - FIBROCYSTIC CHANGES. - BIOPSY SITE AND BIOPSY CLIP. 2. Lymph node, sentinel, biopsy, Right Axillary #1 - ONE BENIGN LYMPH NODE (0/1). 3. Lymph node, sentinel, biopsy, Right Axillary #2 - ONE BENIGN LYMPH NODE (0/1).    05/01/2018 -  Anti-estrogen oral therapy  Discontinued Evista. Anastrozole 1 mg daily       INTERVAL HISTORY:  Meagan Ryan was contacted for a follow up of breast cancer. She was last seen by me on 04/10/21.  She reports doing okay overall. She notes some minimal improvement in her symptoms since stopping anastrozole.   All other systems were reviewed with the patient and are negative.  MEDICAL HISTORY:  Past Medical History:  Diagnosis Date   Allergic rhinitis    Asthma    Breast cancer ([place]) 1996   Left breast-stage II infiltrating ductal  carcinoma, 3/8 axillary lymph nodes were positive for metastatic carcinoma, getting yearly mammogram and every other year MRI followed by oncologist Dr Benay Spice   Complication of anesthesia    WAKE UP WITH HEADACHE, difficulty waking up   DCM (dilated cardiomyopathy) ([place])    EF 55-60% by echo 04/2019   Dysuria 2011   Family history of adverse reaction to anesthesia    "family wakes up with personality changes, but are very short term"   Fatty liver CT 1/12   elevated LFT   Genetic testing 03/13/2018   Common cancers panel (47 genes) @ [facility] - No pathogenic mutations detected   H/O fibromyalgia    H/O osteopenia 2009   H/O peripheral neuropathy    H/O varicose veins    Hyperlipidemia    Hypertension    Incomplete RBBB    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  Intolerant to PAP therapy   Pelvic pain in female    PMB (postmenopausal bleeding) 2011   Pregnancy induced hypertension    Right-sided lacunar infarction [facility])    MRI 1.2012   Tubular adenoma of colon     SURGICAL HISTORY: Past Surgical History:  Procedure Laterality Date   Gastroc muscle repair  2007   LAPAROSCOPIC CHOLECYSTECTOMY     MASTECTOMY  06/1995   Left - for infiltrating ductal carcinoma   MASTECTOMY W/ SENTINEL NODE BIOPSY Right 03/31/2018   MASTECTOMY W/ SENTINEL NODE BIOPSY Right 03/31/2018   Procedure: RIGHT TOTAL MASTECTOMY WITH AXILLARY SENTINEL LYMPH NODE BIOPSY ERAS PATHWAY;  Surgeon: Excell Seltzer, MD;  Location: [place];  Service: General;  Laterality: Right;   NEPHRECTOMY Right late 1990s   due to kidney shrinkage   PACEMAKER IMPLANT N/A 04/16/2019   Procedure: PACEMAKER IMPLANT;  Surgeon: Constance Haw, MD;  Location: [place] [facility];  Service: Cardiovascular;  Laterality: N/A;   ROBOTIC ASSISTED TOTAL HYSTERECTOMY WITH BILATERAL SALPINGO OOPHERECTOMY Bilateral 10/09/2015   Procedure:  ROBOTIC ASSISTED BILATERAL SALPINGO OOPHORECTOMY ;  Surgeon: Everitt Amber, MD;  Location: [facility];  Service: Gynecology;  Laterality: Bilateral;   Tram surgery  1996   TUBAL LIGATION      I have reviewed the social history and family history with the patient and they are unchanged from previous note.  ALLERGIES:  is allergic to amlodipine, noroxin [norfloxacin], cephalexin, chocolate, codeine, sulfa antibiotics, latex, and penicillins.  MEDICATIONS:  Current Outpatient Medications  Medication Sig Dispense Refill   ALPRAZolam (XANAX) 0.5 MG tablet Take 0.5 mg by mouth 2 (two) times daily.     anastrozole (ARIMIDEX) 1 MG tablet TAKE 1 TABLET DAILY 90 tablet 1   apixaban (ELIQUIS) 5 MG TABS tablet Take 1 tablet (5 mg total) by mouth 2 (two) times daily. 180 tablet 3   carvedilol (COREG) 12.5 MG  tablet Take 1 tablet (12.5 mg total) by mouth 2 (two) times daily with a meal. 180 tablet 2   cetirizine (ZYRTEC) 10 MG tablet Take 10 mg by mouth every morning.      DULoxetine (CYMBALTA) 60 MG capsule Take 60 mg by mouth daily.     fluticasone (FLONASE) 50 MCG/ACT nasal spray Place 1 spray into both nostrils every morning.      gabapentin (NEURONTIN) 300 MG capsule Take 300 mg by mouth 2 (two) times daily. (Patient not taking: Reported on 07/07/2021)     ivabradine (CORLANOR) 5 MG TABS tablet Take 1 tablet (5 mg total) by mouth 2 (two) times daily. 180 tablet 0   niacin 500 MG tablet Take 500 mg by mouth daily.     Omega-3 Fatty Acids (FISH OIL) 1000 MG CAPS Take 2,000 mg by mouth 2 (two) times daily.     omeprazole (PRILOSEC) 20 MG capsule Take 20 mg by mouth daily.     potassium chloride (K-DUR) 10 MEQ tablet Take 10 mEq by mouth daily.     predniSONE (DELTASONE) 10 MG tablet Take 1 tablet (10 mg total) by mouth daily with breakfast. 30 tablet 1   sacubitril-valsartan (ENTRESTO) 24-26 MG Take 1 tablet by mouth 2 (two) times daily. 180 tablet 0   VITAMIN D PO Take 4,000 Units by mouth in the morning and  at bedtime.      No current facility-administered medications for this visit.    PHYSICAL EXAMINATION: ECOG PERFORMANCE STATUS: 1 - Symptomatic but completely ambulatory  There were no vitals filed for this visit. Wt Readings from Last 3 Encounters:  07/07/21 250 lb 9.6 oz (113.7 kg)  06/17/21 255 lb (115.7 kg)  04/10/21 252 lb 9.6 oz (114.6 kg)     No vitals taken today, Exam not performed today  LABORATORY DATA:  I have reviewed the data as listed CBC Latest Ref Rng & Units 04/10/2021 02/20/2020 09/27/2019  WBC 4.0 - 10.5 K/uL 10.3 5.0 4.9  Hemoglobin 12.0 - 15.0 g/dL 13.2 13.1 13.8  Hematocrit 36.0 - 46.0 % 41.4 42.2 42.1  Platelets 150 - 400 K/uL 283 253 287     CMP Latest Ref Rng & Units 07/07/2021 04/10/2021 02/20/2020  Glucose 65 - 99 mg/dL 113(H) 133(H) 91  BUN 8 - 27 mg/dL 11 10 6(L)  Creatinine 0.57 - 1.00 mg/dL 0.92 0.92 0.92  Sodium 134 - 144 mmol/L 136 135 136  Potassium 3.5 - 5.2 mmol/L 4.7 4.4 4.7  Chloride 96 - 106 mmol/L 100 102 102  CO2 20 - 29 mmol/L _0 Calcium 8.7 - 10.3 mg/dL 9.8 9.5 9.9  Total Protein 6.5 - 8.1 g/dL - 7.2 -  Total Bilirubin 0.3 - 1.2 mg/dL - 0.7 -  Alkaline Phos 38 - 126 U/L - 66 -  AST 15 - 41 U/L - 10(L) -  ALT 0 - 44 U/L - 11 -      RADIOGRAPHIC STUDIES: I have personally reviewed the radiological images as listed and agreed with the findings in the report. No results found.    No orders of the defined types were placed in this encounter.  All questions were answered. The patient knows to call the clinic with any problems, questions or concerns. No barriers to learning was detected. The total time spent in the appointment was 15 minutes.     Truitt Merle, MD 07/16/2021   I, Wilburn Mylar, am acting as scribe for Truitt Merle, MD.  I have reviewed the above documentation for accuracy and completeness, and I agree with the above.

## 2021-07-22 ENCOUNTER — Telehealth (HOSPITAL_COMMUNITY): Payer: Self-pay | Admitting: *Deleted

## 2021-07-22 ENCOUNTER — Encounter (HOSPITAL_COMMUNITY): Payer: Self-pay

## 2021-07-22 NOTE — Telephone Encounter (Signed)
Reaching out to patient to offer assistance regarding upcoming cardiac imaging study; pt verbalizes understanding of appt date/time, parking situation and where to check in, pre-test NPO status and medications ordered, and verified current allergies; name and call back number provided for further questions should they arise  Wilhelmina Hark RN Navigator Cardiac Imaging [facility] [phone] office [phone] cell  

## 2021-07-22 NOTE — Telephone Encounter (Signed)
Attempted to call patient regarding upcoming cardiac CT appointment. Unable to leave patient a voicemail.  Gordy Clement RN Navigator Cardiac Imaging [facility] [phone] Office [phone] Cell

## 2021-07-23 NOTE — Progress Notes (Signed)
Remote pacemaker transmission.   

## 2021-07-24 ENCOUNTER — Ambulatory Visit (HOSPITAL_COMMUNITY): Admission: RE | Admit: 2021-07-24 | Payer: Medicare Other | Source: Ambulatory Visit

## 2021-07-30 ENCOUNTER — Other Ambulatory Visit (HOSPITAL_COMMUNITY): Payer: Medicare Other

## 2021-08-13 ENCOUNTER — Other Ambulatory Visit: Payer: Self-pay

## 2021-08-13 ENCOUNTER — Ambulatory Visit (HOSPITAL_COMMUNITY): Payer: Medicare Other | Attending: Cardiology

## 2021-08-13 DIAGNOSIS — R072 Precordial pain: Secondary | ICD-10-CM | POA: Insufficient documentation

## 2021-08-13 DIAGNOSIS — R0609 Other forms of dyspnea: Secondary | ICD-10-CM | POA: Insufficient documentation

## 2021-08-13 DIAGNOSIS — I5032 Chronic diastolic (congestive) heart failure: Secondary | ICD-10-CM | POA: Diagnosis present

## 2021-08-13 DIAGNOSIS — I441 Atrioventricular block, second degree: Secondary | ICD-10-CM | POA: Insufficient documentation

## 2021-08-13 DIAGNOSIS — I420 Dilated cardiomyopathy: Secondary | ICD-10-CM | POA: Diagnosis present

## 2021-08-13 DIAGNOSIS — I480 Paroxysmal atrial fibrillation: Secondary | ICD-10-CM | POA: Diagnosis present

## 2021-08-13 DIAGNOSIS — I10 Essential (primary) hypertension: Secondary | ICD-10-CM | POA: Diagnosis present

## 2021-08-13 DIAGNOSIS — R5382 Chronic fatigue, unspecified: Secondary | ICD-10-CM | POA: Insufficient documentation

## 2021-08-13 DIAGNOSIS — G4733 Obstructive sleep apnea (adult) (pediatric): Secondary | ICD-10-CM | POA: Insufficient documentation

## 2021-08-13 LAB — ECHOCARDIOGRAM COMPLETE
Area-P 1/2: 2.91 cm2
S' Lateral: 2.9 cm

## 2021-08-13 MED ORDER — PERFLUTREN LIPID MICROSPHERE
1.0000 mL | INTRAVENOUS | Status: AC | PRN
  Administered 2021-08-13: 3 mL via INTRAVENOUS

## 2021-08-18 ENCOUNTER — Other Ambulatory Visit (HOSPITAL_COMMUNITY): Payer: Self-pay | Admitting: *Deleted

## 2021-08-18 ENCOUNTER — Telehealth (HOSPITAL_COMMUNITY): Payer: Self-pay | Admitting: *Deleted

## 2021-08-18 DIAGNOSIS — Z01812 Encounter for preprocedural laboratory examination: Secondary | ICD-10-CM

## 2021-08-18 NOTE — Telephone Encounter (Signed)
Attempted to call patient regarding upcoming cardiac CT appointment. Patient didn't pick up phone and was unable to leave a message.   Gordy Clement RN Navigator Cardiac Imaging [facility] [phone] Office [phone] Cell

## 2021-08-19 ENCOUNTER — Telehealth (HOSPITAL_COMMUNITY): Payer: Self-pay | Admitting: *Deleted

## 2021-08-19 NOTE — Telephone Encounter (Signed)
Reaching out to patient to offer assistance regarding upcoming cardiac imaging study; pt verbalizes understanding of appt date/time, but states she will not make her appointment. Patient states she tested positive for COVID yesterday (10/11).  Patient states she will call back when she is ready. Patient made aware I would alert the scheduler to follow up with scheduling.  Gordy Clement RN Navigator Cardiac Imaging [facility] [phone] office [phone] cell

## 2021-08-20 ENCOUNTER — Ambulatory Visit (HOSPITAL_COMMUNITY): Admission: RE | Admit: 2021-08-20 | Payer: Medicare Other | Source: Ambulatory Visit

## 2021-09-02 ENCOUNTER — Telehealth: Payer: Self-pay

## 2021-09-02 ENCOUNTER — Ambulatory Visit (INDEPENDENT_AMBULATORY_CARE_PROVIDER_SITE_OTHER): Payer: Medicare Other | Admitting: Orthopedic Surgery

## 2021-09-02 ENCOUNTER — Other Ambulatory Visit: Payer: Self-pay

## 2021-09-02 DIAGNOSIS — M25562 Pain in left knee: Secondary | ICD-10-CM | POA: Diagnosis not present

## 2021-09-02 DIAGNOSIS — M1712 Unilateral primary osteoarthritis, left knee: Secondary | ICD-10-CM | POA: Diagnosis not present

## 2021-09-02 NOTE — Telephone Encounter (Signed)
Holding. Will submit after 11/11/2021 due to new year/insurance.

## 2021-09-02 NOTE — Telephone Encounter (Signed)
Can we get patient approved for left knee gel injection to be done in Jan.

## 2021-09-04 ENCOUNTER — Encounter: Payer: Self-pay | Admitting: Orthopedic Surgery

## 2021-09-04 MED ORDER — LIDOCAINE HCL 1 % IJ SOLN
5.0000 mL | INTRAMUSCULAR | Status: AC | PRN
  Administered 2021-09-02: 5 mL

## 2021-09-04 MED ORDER — METHYLPREDNISOLONE ACETATE 40 MG/ML IJ SUSP
40.0000 mg | INTRAMUSCULAR | Status: AC | PRN
  Administered 2021-09-02: 40 mg via INTRA_ARTICULAR

## 2021-09-04 MED ORDER — BUPIVACAINE HCL 0.25 % IJ SOLN
4.0000 mL | INTRAMUSCULAR | Status: AC | PRN
Start: 2021-09-02 — End: 2021-09-02
  Administered 2021-09-02: 4 mL via INTRA_ARTICULAR

## 2021-09-04 NOTE — Progress Notes (Signed)
Office Visit Note   Patient: Meagan Ryan           Date of Birth: 12/04/1955           MRN: [id number] Visit Date: 09/02/2021 Requested by: Kristen Loader, [address] [place],  [place] [address] PCP: Kristen Loader, FNP  Subjective: Chief Complaint  Patient presents with   Left Knee - Pain    HPI: Patient presents for evaluation of left knee pain.  She has known history of left knee arthritis.  Had aspiration and injection June 19, 2021 with good relief.  Reports now recurrent pain and swelling.  Wants to avoid knee replacement if possible.  Does have pain which limits her activities of daily living.              ROS: All systems reviewed are negative as they relate to the chief complaint within the history of present illness.  Patient denies  fevers or chills.   Assessment & Plan: Visit Diagnoses:  1. Arthritis of left knee     Plan: Impression is left knee pain and arthritis.  Repeat aspiration injection performed today.  We will preapproved for gel shot for January.  Potentially start her on the every 3 month injection cycle at that time to try to mitigate some of the pain from her left knee arthritis. This patient is diagnosed with osteoarthritis of the knee(s).    Radiographs show evidence of joint space narrowing, osteophytes, subchondral sclerosis and/or subchondral cysts.  This patient has knee pain which interferes with functional and activities of daily living.    This patient has experienced inadequate response, adverse effects and/or intolerance with conservative treatments such as acetaminophen, NSAIDS, topical creams, physical therapy or regular exercise, knee bracing and/or weight loss.   This patient has experienced inadequate response or has a contraindication to intra articular steroid injections for at least 3 months.   This patient is not scheduled to have a total knee replacement within 6 months of starting treatment with  viscosupplementation.   Follow-Up Instructions: Return if symptoms worsen or fail to improve.   Orders:  No orders of the defined types were placed in this encounter.  No orders of the defined types were placed in this encounter.     Procedures: Large Joint Inj: L knee on 09/02/2021 8:12 AM Indications: diagnostic evaluation, joint swelling and pain Details: 18 G 1.5 in needle, superolateral approach  Arthrogram: No  Medications: 5 mL lidocaine 1 %; 40 mg methylPREDNISolone acetate 40 MG/ML; 4 mL bupivacaine 0.25 % Outcome: tolerated well, no immediate complications Procedure, treatment alternatives, risks and benefits explained, specific risks discussed. Consent was given by the patient. Immediately prior to procedure a time out was called to verify the correct patient, procedure, equipment, support staff and site/side marked as required. Patient was prepped and draped in the usual sterile fashion.      Clinical Data: No additional findings.  Objective: Vital Signs: There were no vitals taken for this visit.  Physical Exam:   Constitutional: Patient appears well-developed HEENT:  Head: Normocephalic Eyes:EOM are normal Neck: Normal range of motion Cardiovascular: Normal rate Pulmonary/chest: Effort normal Neurologic: Patient is alert Skin: Skin is warm Psychiatric: Patient has normal mood and affect   Ortho Exam: Ortho exam demonstrates intact extensor mechanism in the left knee.  Has about a 5 degree flexion contracture but can flex past 90.  Mild effusion present.  Pedal pulses palpable.  No groin pain with  internal or external rotation of that left leg.  Specialty Comments:  No specialty comments available.  Imaging: No results found.   PMFS History: Patient Active Problem List   Diagnosis Date Noted   Dyspnea 01/14/2020   Symptomatic advanced heart block 04/15/2019   COVID-19 virus infection 04/15/2019   History of left breast cancer 05/01/2018    Genetic testing 03/13/2018   Family history of breast cancer    Malignant neoplasm of upper-outer quadrant of right breast in female, estrogen receptor positive ([place]) 02/23/2018   Syncope 12/14/2017   Pain and swelling of right lower leg 10/28/2016   Right leg numbness 09/38/1829   Chronic systolic CHF (congestive heart failure), NYHA class 2 ([place]) 02/26/2016   OSA (obstructive sleep apnea) 12/31/2015   History of breast cancer in female 10/30/2015   Ovarian cyst 10/10/2015   Bilateral ovarian cysts 10/09/2015   S/P oophorectomy 10/09/2015   Morbid obesity with BMI of 40.0-44.9, adult ([place]) 08/08/2015   Benign essential HTN 09/18/2014   RBBB 09/18/2014   Incomplete RBBB    DCM (dilated cardiomyopathy) ([place]) 04/24/2014   Meniere disease 06/02/2012   Past Medical History:  Diagnosis Date   Allergic rhinitis    Asthma    Breast cancer ([place]) 1996   Left breast-stage II infiltrating ductal carcinoma, 3/8 axillary lymph nodes were positive for metastatic carcinoma, getting yearly mammogram and every other year MRI followed by oncologist Dr Benay Spice   Complication of anesthesia    WAKE UP WITH HEADACHE, difficulty waking up   DCM (dilated cardiomyopathy) ([place])    EF 55-60% by echo 04/2019   Dysuria 2011   Family history of adverse reaction to anesthesia    "family wakes up with personality changes, but are very short term"   Fatty liver CT 1/12   elevated LFT   Genetic testing 03/13/2018   Common cancers panel (47 genes) @ [facility] - No pathogenic mutations detected   H/O fibromyalgia    H/O osteopenia 2009   H/O peripheral neuropathy    H/O varicose veins    Hyperlipidemia    Hypertension    Incomplete RBBB    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  Intolerant to PAP therapy   Pelvic pain in female    PMB (postmenopausal bleeding) 2011   Pregnancy induced hypertension    Right-sided  lacunar infarction [facility])    MRI 1.2012   Tubular adenoma of colon     Family History  Problem Relation Age of Onset   Heart disease Mother        MI   Stroke Mother    Diabetes Mother    Hypertension Mother    Stroke Father    Prostate cancer Father 41       deceased 32   Colon cancer Sister 18       currently 59   Breast cancer Paternal Aunt 73       deceased 71s   Breast cancer Cousin        daughter of pat aunt w/ breast ca   Breast cancer Cousin        daughter of pat aunt w/ breast ca    Past Surgical History:  Procedure Laterality Date   Gastroc muscle repair  2007   LAPAROSCOPIC CHOLECYSTECTOMY     MASTECTOMY  06/1995   Left - for infiltrating ductal  carcinoma   MASTECTOMY W/ SENTINEL NODE BIOPSY Right 03/31/2018   MASTECTOMY W/ SENTINEL NODE BIOPSY Right 03/31/2018   Procedure: RIGHT TOTAL MASTECTOMY WITH AXILLARY SENTINEL LYMPH NODE BIOPSY ERAS PATHWAY;  Surgeon: Excell Seltzer, MD;  Location: [place] [facility];  Service: General;  Laterality: Right;   NEPHRECTOMY Right late 1990s   due to kidney shrinkage   PACEMAKER IMPLANT N/A 04/16/2019   Procedure: PACEMAKER IMPLANT;  Surgeon: Constance Haw, MD;  Location: [place];  Service: Cardiovascular;  Laterality: N/A;   ROBOTIC ASSISTED TOTAL HYSTERECTOMY WITH BILATERAL SALPINGO OOPHERECTOMY Bilateral 10/09/2015   Procedure: ROBOTIC ASSISTED BILATERAL SALPINGO OOPHORECTOMY ;  Surgeon: Everitt Amber, MD;  [facility];  Service: Gynecology;  Laterality: Bilateral;   Tram surgery  1996   TUBAL LIGATION     Social History   Occupational History   Not on file  Tobacco Use   Smoking status: Never   Smokeless tobacco: Never  Vaping Use   Vaping Use: Never used  Substance and Sexual Activity   Alcohol use: No   Drug use: No   Sexual activity: Not on file

## 2021-09-14 ENCOUNTER — Telehealth: Payer: Self-pay | Admitting: Cardiology

## 2021-09-14 DIAGNOSIS — I5032 Chronic diastolic (congestive) heart failure: Secondary | ICD-10-CM

## 2021-09-14 DIAGNOSIS — R0609 Other forms of dyspnea: Secondary | ICD-10-CM

## 2021-09-14 DIAGNOSIS — I272 Pulmonary hypertension, unspecified: Secondary | ICD-10-CM

## 2021-09-14 NOTE — Telephone Encounter (Signed)
Meagan Margarita, MD  08/17/2021  9:45 PM EDT     Echo showed normal heart function with mild pulmonary HTN.  Please get PFTs with [facility] for PHTN as well as home sleep study and VQ scan to rule out PE due to pulmonary HTN - repeat echo in 1 year   The patient has been notified of the result and verbalized understanding.  All questions (if any) were answered. Antonieta Iba, RN 09/14/2021 2:06 PM

## 2021-09-14 NOTE — Telephone Encounter (Signed)
Follow Up:      Patient said she received a letter from [place], concerning her  Echo results.

## 2021-09-23 ENCOUNTER — Other Ambulatory Visit: Payer: Self-pay

## 2021-09-23 ENCOUNTER — Ambulatory Visit (HOSPITAL_COMMUNITY)
Admission: RE | Admit: 2021-09-23 | Discharge: 2021-09-23 | Disposition: A | Discharge status: Home / Self Care | Payer: Medicare Other | Source: Ambulatory Visit | Attending: Cardiology | Admitting: Cardiology

## 2021-09-23 DIAGNOSIS — I272 Pulmonary hypertension, unspecified: Secondary | ICD-10-CM | POA: Insufficient documentation

## 2021-09-23 DIAGNOSIS — I5032 Chronic diastolic (congestive) heart failure: Secondary | ICD-10-CM

## 2021-09-23 DIAGNOSIS — R0609 Other forms of dyspnea: Secondary | ICD-10-CM

## 2021-09-23 MED ORDER — TECHNETIUM TO 99M ALBUMIN AGGREGATED
4.4000 | Freq: Once | INTRAVENOUS | Status: AC | PRN
  Administered 2021-09-23: 4.4 via INTRAVENOUS

## 2021-09-24 ENCOUNTER — Encounter (HOSPITAL_COMMUNITY): Payer: Self-pay

## 2021-09-28 ENCOUNTER — Other Ambulatory Visit: Payer: Self-pay | Admitting: Cardiology

## 2021-10-12 ENCOUNTER — Other Ambulatory Visit: Payer: Self-pay | Admitting: Hematology

## 2021-10-12 DIAGNOSIS — Z170 Estrogen receptor positive status [ER+]: Secondary | ICD-10-CM

## 2021-10-12 DIAGNOSIS — C50411 Malignant neoplasm of upper-outer quadrant of right female breast: Secondary | ICD-10-CM

## 2021-10-13 ENCOUNTER — Ambulatory Visit (INDEPENDENT_AMBULATORY_CARE_PROVIDER_SITE_OTHER): Payer: Medicare Other

## 2021-10-13 DIAGNOSIS — I420 Dilated cardiomyopathy: Secondary | ICD-10-CM

## 2021-10-13 LAB — CUP PACEART REMOTE DEVICE CHECK
Battery Remaining Longevity: 112 mo
Battery Voltage: 3 V
Brady Statistic AP VP Percent: 36.1 %
Brady Statistic AP VS Percent: 0 %
Brady Statistic AS VP Percent: 63.89 %
Brady Statistic AS VS Percent: 0.01 %
Brady Statistic RA Percent Paced: 36.04 %
Brady Statistic RV Percent Paced: 99.99 %
Date Time Interrogation Session: 20221205214041
Implantable Lead Implant Date: 20200608
Implantable Lead Implant Date: 20200608
Implantable Lead Location: 753859
Implantable Lead Location: 753860
Implantable Lead Model: 5076
Implantable Lead Model: 5076
Implantable Pulse Generator Implant Date: 20200608
Lead Channel Impedance Value: 342 Ohm
Lead Channel Impedance Value: 361 Ohm
Lead Channel Impedance Value: 399 Ohm
Lead Channel Impedance Value: 437 Ohm
Lead Channel Pacing Threshold Amplitude: 0.75 V
Lead Channel Pacing Threshold Amplitude: 0.875 V
Lead Channel Pacing Threshold Pulse Width: 0.4 ms
Lead Channel Pacing Threshold Pulse Width: 0.4 ms
Lead Channel Sensing Intrinsic Amplitude: 15.375 mV
Lead Channel Sensing Intrinsic Amplitude: 15.375 mV
Lead Channel Sensing Intrinsic Amplitude: 2.625 mV
Lead Channel Sensing Intrinsic Amplitude: 2.625 mV
Lead Channel Setting Pacing Amplitude: 1.5 V
Lead Channel Setting Pacing Amplitude: 2.5 V
Lead Channel Setting Pacing Pulse Width: 0.4 ms
Lead Channel Setting Sensing Sensitivity: 2.8 mV

## 2021-10-14 ENCOUNTER — Ambulatory Visit: Payer: Medicare Other | Admitting: Nurse Practitioner

## 2021-10-14 ENCOUNTER — Other Ambulatory Visit: Payer: Medicare Other

## 2021-10-22 NOTE — Progress Notes (Signed)
Remote pacemaker transmission.   

## 2021-11-10 ENCOUNTER — Telehealth: Payer: Self-pay

## 2021-11-10 NOTE — Telephone Encounter (Signed)
VOB submitted for Monovisc, left knee. BV pending.

## 2021-11-11 ENCOUNTER — Telehealth: Payer: Self-pay

## 2021-11-11 NOTE — Telephone Encounter (Signed)
Approved for Monovisc, left knee. [place] will pick up remaining eligible expenses at 100%. Will cover Medicare part B deductible No Co-pay No PA required  Appt. 11/23/2021

## 2021-11-23 ENCOUNTER — Other Ambulatory Visit: Payer: Self-pay

## 2021-11-23 ENCOUNTER — Ambulatory Visit (INDEPENDENT_AMBULATORY_CARE_PROVIDER_SITE_OTHER): Payer: Medicare Other

## 2021-11-23 ENCOUNTER — Ambulatory Visit (INDEPENDENT_AMBULATORY_CARE_PROVIDER_SITE_OTHER): Payer: Medicare Other | Admitting: Orthopedic Surgery

## 2021-11-23 ENCOUNTER — Encounter: Payer: Self-pay | Admitting: Orthopedic Surgery

## 2021-11-23 ENCOUNTER — Ambulatory Visit: Payer: Self-pay

## 2021-11-23 DIAGNOSIS — M25561 Pain in right knee: Secondary | ICD-10-CM | POA: Diagnosis not present

## 2021-11-23 DIAGNOSIS — M25562 Pain in left knee: Secondary | ICD-10-CM

## 2021-11-23 DIAGNOSIS — M1711 Unilateral primary osteoarthritis, right knee: Secondary | ICD-10-CM | POA: Diagnosis not present

## 2021-11-23 DIAGNOSIS — M1712 Unilateral primary osteoarthritis, left knee: Secondary | ICD-10-CM

## 2021-11-23 DIAGNOSIS — M170 Bilateral primary osteoarthritis of knee: Secondary | ICD-10-CM

## 2021-11-25 MED ORDER — HYALURONAN 88 MG/4ML IX SOSY
88.0000 mg | PREFILLED_SYRINGE | INTRA_ARTICULAR | Status: AC | PRN
  Administered 2021-11-23: 88 mg via INTRA_ARTICULAR

## 2021-11-25 MED ORDER — BUPIVACAINE HCL 0.25 % IJ SOLN
4.0000 mL | INTRAMUSCULAR | Status: AC | PRN
  Administered 2021-11-23: 4 mL via INTRA_ARTICULAR

## 2021-11-25 MED ORDER — LIDOCAINE HCL 1 % IJ SOLN
5.0000 mL | INTRAMUSCULAR | Status: AC | PRN
  Administered 2021-11-23: 5 mL

## 2021-11-25 MED ORDER — METHYLPREDNISOLONE ACETATE 40 MG/ML IJ SUSP
40.0000 mg | INTRAMUSCULAR | Status: AC | PRN
  Administered 2021-11-23: 40 mg via INTRA_ARTICULAR

## 2021-11-25 NOTE — Progress Notes (Signed)
Office Visit Note   Patient: Meagan Ryan           Date of Birth: Mar 23, 1956           MRN: [id number] Visit Date: 11/23/2021 Requested by: Kristen Loader, [address] [place],  [place] [address] PCP: Kristen Loader, FNP  Subjective: Chief Complaint  Patient presents with   Left Knee - Follow-up    HPI: Meagan Ryan is a 66 year old patient with bilateral knee pain.  Here for left knee Monovisc injection.  Also having some mild right knee pain.  No definite mechanical symptoms.  She has done well with injection in the left knee in the past which was cortisone which gave her 2 months of relief.  Overall she gets around around the house.  Does not do too much in terms of athletic activity.              ROS: All systems reviewed are negative as they relate to the chief complaint within the history of present illness.  Patient denies  fevers or chills.   Assessment & Plan: Visit Diagnoses:  1. Pain in both knees, unspecified chronicity     Plan: Impression is bilateral knee pain with mild medial joint space narrowing and slight varus alignment bilaterally.  Plan is left knee Monovisc and right knee cortisone injection.  We will see how she does with those.  Could consider a scheduled injection treatment algorithm for Meagan Ryan if these injections help.  Do not see an operative problem in the knees at this time.  Follow-Up Instructions: Return if symptoms worsen or fail to improve.   Orders:  Orders Placed This Encounter  Procedures   XR Knee 1-2 Views Left   XR KNEE 3 VIEW RIGHT   No orders of the defined types were placed in this encounter.     Procedures: Large Joint Inj: L knee on 11/23/2021 9:32 AM Indications: pain, joint swelling and diagnostic evaluation Details: 18 G 1.5 in needle, superolateral approach  Arthrogram: No  Medications: 5 mL lidocaine 1 %; 88 mg Hyaluronan 88 MG/4ML Outcome: tolerated well, no immediate complications Procedure, treatment  alternatives, risks and benefits explained, specific risks discussed. Consent was given by the patient. Immediately prior to procedure a time out was called to verify the correct patient, procedure, equipment, support staff and site/side marked as required. Patient was prepped and draped in the usual sterile fashion.    Large Joint Inj: R knee on 11/23/2021 9:32 AM Indications: diagnostic evaluation, joint swelling and pain Details: 18 G 1.5 in needle, superolateral approach  Arthrogram: No  Medications: 5 mL lidocaine 1 %; 40 mg methylPREDNISolone acetate 40 MG/ML; 4 mL bupivacaine 0.25 % Outcome: tolerated well, no immediate complications Procedure, treatment alternatives, risks and benefits explained, specific risks discussed. Consent was given by the patient. Immediately prior to procedure a time out was called to verify the correct patient, procedure, equipment, support staff and site/side marked as required. Patient was prepped and draped in the usual sterile fashion.      Clinical Data: No additional findings.  Objective: Vital Signs: There were no vitals taken for this visit.  Physical Exam:   Constitutional: Patient appears well-developed HEENT:  Head: Normocephalic Eyes:EOM are normal Neck: Normal range of motion Cardiovascular: Normal rate Pulmonary/chest: Effort normal Neurologic: Patient is alert Skin: Skin is warm Psychiatric: Patient has normal mood and affect   Ortho Exam: Ortho exam demonstrates slight varus alignment.  Palpable pedal pulses.  Trace effusion left no effusion right.  She has full range of motion from full extension to about 120 of flexion bilaterally.  Collateral and cruciate ligaments are stable.  No groin pain on either side with internal and external rotation of the leg.  Specialty Comments:  No specialty comments available.  Imaging: No results found.   PMFS History: Patient Active Problem List   Diagnosis Date Noted   Dyspnea  01/14/2020   Symptomatic advanced heart block 04/15/2019   COVID-19 virus infection 04/15/2019   History of left breast cancer 05/01/2018   Genetic testing 03/13/2018   Family history of breast cancer    Malignant neoplasm of upper-outer quadrant of right breast in female, estrogen receptor positive ([place]) 02/23/2018   Syncope 12/14/2017   Pain and swelling of right lower leg 10/28/2016   Right leg numbness 49/67/5916   Chronic systolic CHF (congestive heart failure), NYHA class 2 ([place]) 02/26/2016   OSA (obstructive sleep apnea) 12/31/2015   History of breast cancer in female 10/30/2015   Ovarian cyst 10/10/2015   Bilateral ovarian cysts 10/09/2015   S/P oophorectomy 10/09/2015   Morbid obesity with BMI of 40.0-44.9, adult ([facility]) 08/08/2015   Benign essential HTN 09/18/2014   RBBB 09/18/2014   Incomplete RBBB    DCM (dilated cardiomyopathy) ([place]) 04/24/2014   Meniere disease 06/02/2012   Past Medical History:  Diagnosis Date   Allergic rhinitis    Asthma    Breast cancer ([place]) 1996   Left breast-stage II infiltrating ductal carcinoma, 3/8 axillary lymph nodes were positive for metastatic carcinoma, getting yearly mammogram and every other year MRI followed by oncologist Dr Benay Spice   Complication of anesthesia    WAKE UP WITH HEADACHE, difficulty waking up   DCM (dilated cardiomyopathy) ([place])    EF 55-60% by echo 04/2019   Dysuria 2011   Family history of adverse reaction to anesthesia    "family wakes up with personality changes, but are very short term"   Fatty liver CT 1/12   elevated LFT   Genetic testing 03/13/2018   Common cancers panel (47 genes) @ [facility] - No pathogenic mutations detected   H/O fibromyalgia    H/O osteopenia 2009   H/O peripheral neuropathy    H/O varicose veins    Hyperlipidemia    Hypertension    Incomplete RBBB    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  Intolerant to PAP therapy   Pelvic pain in female    PMB (postmenopausal bleeding) 2011   Pregnancy induced hypertension    Right-sided lacunar infarction [facility])    MRI 1.2012   Tubular adenoma of colon     Family History  Problem Relation Age of Onset   Heart disease Mother        MI   Stroke Mother    Diabetes Mother    Hypertension Mother    Stroke Father    Prostate cancer Father 72       deceased 70   Colon cancer Sister 19       currently 42   Breast cancer Paternal Aunt 52       deceased 27s   Breast cancer Cousin        daughter of pat aunt w/ breast ca   Breast cancer Cousin        daughter of pat aunt w/ breast  ca    Past Surgical History:  Procedure Laterality Date   Gastroc muscle repair  2007   LAPAROSCOPIC CHOLECYSTECTOMY     MASTECTOMY  06/1995   Left - for infiltrating ductal carcinoma   MASTECTOMY W/ SENTINEL NODE BIOPSY Right 03/31/2018   MASTECTOMY W/ SENTINEL NODE BIOPSY Right 03/31/2018   Procedure: RIGHT TOTAL MASTECTOMY WITH AXILLARY SENTINEL LYMPH NODE BIOPSY ERAS PATHWAY;  Surgeon: Excell Seltzer, MD;  Location: [facility];  Service: General;  Laterality: Right;   NEPHRECTOMY Right late 1990s   due to kidney shrinkage   PACEMAKER IMPLANT N/A 04/16/2019   Procedure: PACEMAKER IMPLANT;  Surgeon: Constance Haw, MD;  Location: [facility];  Service: Cardiovascular;  Laterality: N/A;   ROBOTIC ASSISTED TOTAL HYSTERECTOMY WITH BILATERAL SALPINGO OOPHERECTOMY Bilateral 10/09/2015   Procedure: ROBOTIC ASSISTED BILATERAL SALPINGO OOPHORECTOMY ;  Surgeon: Everitt Amber, MD;  Location: [facility];  Service: Gynecology;  Laterality: Bilateral;   Tram surgery  1996   TUBAL LIGATION     Social History   Occupational History   Not on file  Tobacco Use   Smoking status: Never   Smokeless tobacco: Never  Vaping Use   Vaping Use: Never used  Substance and Sexual Activity   Alcohol use: No   Drug use: No   Sexual activity: Not on file

## 2022-01-12 ENCOUNTER — Ambulatory Visit (INDEPENDENT_AMBULATORY_CARE_PROVIDER_SITE_OTHER): Payer: Medicare Other

## 2022-01-12 DIAGNOSIS — I420 Dilated cardiomyopathy: Secondary | ICD-10-CM

## 2022-01-12 LAB — CUP PACEART REMOTE DEVICE CHECK
Battery Remaining Longevity: 110 mo
Battery Voltage: 3 V
Brady Statistic AP VP Percent: 45.9 %
Brady Statistic AP VS Percent: 0 %
Brady Statistic AS VP Percent: 54.09 %
Brady Statistic AS VS Percent: 0.01 %
Brady Statistic RA Percent Paced: 45.84 %
Brady Statistic RV Percent Paced: 99.99 %
Date Time Interrogation Session: 20230307074513
Implantable Lead Implant Date: 20200608
Implantable Lead Implant Date: 20200608
Implantable Lead Location: 753859
Implantable Lead Location: 753860
Implantable Lead Model: 5076
Implantable Lead Model: 5076
Implantable Pulse Generator Implant Date: 20200608
Lead Channel Impedance Value: 361 Ohm
Lead Channel Impedance Value: 399 Ohm
Lead Channel Impedance Value: 399 Ohm
Lead Channel Impedance Value: 456 Ohm
Lead Channel Pacing Threshold Amplitude: 0.75 V
Lead Channel Pacing Threshold Amplitude: 0.875 V
Lead Channel Pacing Threshold Pulse Width: 0.4 ms
Lead Channel Pacing Threshold Pulse Width: 0.4 ms
Lead Channel Sensing Intrinsic Amplitude: 15.375 mV
Lead Channel Sensing Intrinsic Amplitude: 15.375 mV
Lead Channel Sensing Intrinsic Amplitude: 2.875 mV
Lead Channel Sensing Intrinsic Amplitude: 2.875 mV
Lead Channel Setting Pacing Amplitude: 1.5 V
Lead Channel Setting Pacing Amplitude: 2.5 V
Lead Channel Setting Pacing Pulse Width: 0.4 ms
Lead Channel Setting Sensing Sensitivity: 2.8 mV

## 2022-01-25 NOTE — Progress Notes (Signed)
Remote pacemaker transmission.   

## 2022-03-17 ENCOUNTER — Telehealth: Payer: Self-pay

## 2022-03-17 ENCOUNTER — Ambulatory Visit (INDEPENDENT_AMBULATORY_CARE_PROVIDER_SITE_OTHER): Payer: Medicare Other | Admitting: Orthopedic Surgery

## 2022-03-17 DIAGNOSIS — M170 Bilateral primary osteoarthritis of knee: Secondary | ICD-10-CM

## 2022-03-17 NOTE — Telephone Encounter (Signed)
Noted  

## 2022-03-17 NOTE — Telephone Encounter (Signed)
Auth needed for bilat knee gel injections  

## 2022-03-18 ENCOUNTER — Encounter: Payer: Self-pay | Admitting: Orthopedic Surgery

## 2022-03-18 MED ORDER — BUPIVACAINE HCL 0.25 % IJ SOLN
5.0000 mL | INTRAMUSCULAR | Status: AC | PRN
  Administered 2022-03-17: 5 mL via INTRA_ARTICULAR

## 2022-03-18 MED ORDER — METHYLPREDNISOLONE ACETATE 40 MG/ML IJ SUSP
40.0000 mg | INTRAMUSCULAR | Status: AC | PRN
  Administered 2022-03-17: 40 mg via INTRA_ARTICULAR

## 2022-03-18 MED ORDER — LIDOCAINE HCL 1 % IJ SOLN
5.0000 mL | INTRAMUSCULAR | Status: AC | PRN
  Administered 2022-03-17: 5 mL

## 2022-03-18 NOTE — Progress Notes (Signed)
? ?Office Visit Note ?  ?Patient: Meagan Ryan           ?Date of Birth: [birth year]           ?MRN: [id number] ?Visit Date: 03/17/2022 ?Requested by: Kristen Loader, FNP ?[address] ?[place],  [place] [address] ?PCP: Kristen Loader, FNP ? ?Subjective: ?Chief Complaint  ?Patient presents with  ? Right Knee - Pain  ? Left Knee - Pain  ? ? ?HPI: Suszanne Conners is a 66 year old patient with bilateral knee arthritis.  Had cortisone injection and Monovisc injection in January.  The gel injection lasted a little bit longer than the cortisone.  Reports gradual increase in pain since that intervention 5 months ago.  No interval history of trauma. ?             ?ROS: All systems reviewed are negative as they relate to the chief complaint within the history of present illness.  Patient denies  fevers or chills. ? ? ?Assessment & Plan: ?Visit Diagnoses:  ?1. Primary osteoarthritis of both knees   ? ? ?Plan: Impression is bilateral knee arthritis.  Plan is bilateral cortisone injection performed today.  Preapproved for bilateral gel injections in 3 to 4 months.  Continue with nonweightbearing quad strengthening exercises and follow-up as needed. ? ?Follow-Up Instructions: Return in about 3 months (around 06/17/2022).  ? ?Orders:  ?No orders of the defined types were placed in this encounter. ? ?No orders of the defined types were placed in this encounter. ? ? ? ? Procedures: ?Large Joint Inj: bilateral knee on 03/17/2022 7:07 AM ?Indications: diagnostic evaluation, joint swelling and pain ?Details: 18 G 1.5 in needle, superolateral approach ? ?Arthrogram: No ? ?Medications (Right): 5 mL lidocaine 1 %; 5 mL bupivacaine 0.25 %; 40 mg methylPREDNISolone acetate 40 MG/ML ?Outcome: tolerated well, no immediate complications ?Procedure, treatment alternatives, risks and benefits explained, specific risks discussed. Consent was given by the patient. Immediately prior to procedure a time out was called to verify the correct patient,  procedure, equipment, support staff and site/side marked as required. Patient was prepped and draped in the usual sterile fashion.  ? ? ? ? ?Clinical Data: ?No additional findings. ? ?Objective: ?Vital Signs: There were no vitals taken for this visit. ? ?Physical Exam:  ? ?Constitutional: Patient appears well-developed ?HEENT:  ?Head: Normocephalic ?Eyes:EOM are normal ?Neck: Normal range of motion ?Cardiovascular: Normal rate ?Pulmonary/chest: Effort normal ?Neurologic: Patient is alert ?Skin: Skin is warm ?Psychiatric: Patient has normal mood and affect ? ? ?Ortho Exam: Ortho exam demonstrates slight varus alignment of bilateral lower extremities.  Patient does have full extension and flexion easily past 90 degrees in both knees.  Pedal pulses palpable.  No groin pain with internal/external rotation of the leg.  No effusion in either knee today.  Extensor mechanism intact ?This patient is diagnosed with osteoarthritis of the knee(s).   ? ?Radiographs show evidence of joint space narrowing, osteophytes, subchondral sclerosis and/or subchondral cysts.  This patient has knee pain which interferes with functional and activities of daily living.   ? ?This patient has experienced inadequate response, adverse effects and/or intolerance with conservative treatments such as acetaminophen, NSAIDS, topical creams, physical therapy or regular exercise, knee bracing and/or weight loss.  ? ?This patient has experienced inadequate response or has a contraindication to intra articular steroid injections for at least 3 months.  ? ?This patient is not scheduled to have a total knee replacement within 6 months of starting treatment with viscosupplementation. ? ? ?  Specialty Comments:  ?No specialty comments available. ? ?Imaging: ?No results found. ? ? ?PMFS History: ?Patient Active Problem List  ? Diagnosis Date Noted  ? Dyspnea 01/14/2020  ? Symptomatic advanced heart block 04/15/2019  ? COVID-19 virus infection 04/15/2019  ?  History of left breast cancer 05/01/2018  ? Genetic testing 03/13/2018  ? Family history of breast cancer   ? Malignant neoplasm of upper-outer quadrant of right breast in female, estrogen receptor positive ([place]) 02/23/2018  ? Syncope 12/14/2017  ? Pain and swelling of right lower leg 10/28/2016  ? Right leg numbness 10/28/2016  ? Chronic systolic CHF (congestive heart failure), NYHA class 2 ([place]) 02/26/2016  ? OSA (obstructive sleep apnea) 12/31/2015  ? History of breast cancer in female 10/30/2015  ? Ovarian cyst 10/10/2015  ? Bilateral ovarian cysts 10/09/2015  ? S/P oophorectomy 10/09/2015  ? Morbid obesity with BMI of 40.0-44.9, adult ([place]) 08/08/2015  ? Benign essential HTN 09/18/2014  ? RBBB 09/18/2014  ? Incomplete RBBB   ? DCM (dilated cardiomyopathy) ([place]) 04/24/2014  ? Meniere disease 06/02/2012  ? ?Past Medical History:  ?Diagnosis Date  ? Allergic rhinitis   ? Asthma   ? Breast cancer ([place]) 1996  ? Left breast-stage II infiltrating ductal carcinoma, 3/8 axillary lymph nodes were positive for metastatic carcinoma, getting yearly mammogram and every other year MRI followed by oncologist Dr Benay Spice  ? Complication of anesthesia   ? WAKE UP WITH HEADACHE, difficulty waking up  ? DCM (dilated cardiomyopathy) ([place])   ? EF 55-60% by echo 04/2019  ? Dysuria 2011  ? Family history of adverse reaction to anesthesia   ? "family wakes up with personality changes, but are very short term"  ? Fatty liver CT 1/12  ? elevated LFT  ? Genetic testing 03/13/2018  ? Common cancers panel (47 genes) @ [facility] - No pathogenic mutations detected  ? H/O fibromyalgia   ? H/O osteopenia 2009  ? H/O peripheral neuropathy   ? H/O varicose veins   ? Hyperlipidemia   ? Hypertension   ? Incomplete RBBB   ? Meniere disease   ? deaf in left ear   ? OSA (obstructive sleep apnea) 12/31/2015  ? Very mild with AHI 6/hr but elevated RDI at 32/hr.  Intolerant to PAP therapy  ? Pelvic pain in female   ? PMB (postmenopausal bleeding) 2011  ?  Pregnancy induced hypertension   ? Right-sided lacunar infarction [facility])   ? MRI 1.2012  ? Tubular adenoma of colon   ?  ?Family History  ?Problem Relation Age of Onset  ? Heart disease Mother   ?     MI  ? Stroke Mother   ? Diabetes Mother   ? Hypertension Mother   ? Stroke Father   ? Prostate cancer Father 61  ?     deceased 41  ? Colon cancer Sister 37  ?     currently 54  ? Breast cancer Paternal Aunt 6  ?     deceased 65s  ? Breast cancer Cousin   ?     daughter of pat aunt w/ breast ca  ? Breast cancer Cousin   ?     daughter of pat aunt w/ breast ca  ?  ?Past Surgical History:  ?Procedure Laterality Date  ? Gastroc muscle repair  2007  ? LAPAROSCOPIC CHOLECYSTECTOMY    ? MASTECTOMY  06/1995  ? Left - for infiltrating ductal carcinoma  ? MASTECTOMY W/ SENTINEL NODE BIOPSY Right  03/31/2018  ? MASTECTOMY W/ SENTINEL NODE BIOPSY Right 03/31/2018  ? Procedure: RIGHT TOTAL MASTECTOMY WITH AXILLARY SENTINEL LYMPH NODE BIOPSY ERAS PATHWAY;  Surgeon: Excell Seltzer, MD;  Location: [facility];  Service: General;  Laterality: Right;  ? NEPHRECTOMY Right late 1990s  ? due to kidney shrinkage  ? PACEMAKER IMPLANT N/A 04/16/2019  ? Procedure: PACEMAKER IMPLANT;  Surgeon: Constance Haw, MD;  Location: [facility];  Service: Cardiovascular;  Laterality: N/A;  ? ROBOTIC ASSISTED TOTAL HYSTERECTOMY WITH BILATERAL SALPINGO OOPHERECTOMY Bilateral 10/09/2015  ? Procedure: ROBOTIC ASSISTED BILATERAL SALPINGO OOPHORECTOMY ;  Surgeon: Everitt Amber, MD;  [facility];  Service: Gynecology;  Laterality: Bilateral;  ? Tram surgery  1996  ? TUBAL LIGATION    ? ?Social History  ? ?Occupational History  ? Not on file  ?Tobacco Use  ? Smoking status: Never  ? Smokeless tobacco: Never  ?Vaping Use  ? Vaping Use: Never used  ?Substance and Sexual Activity  ? Alcohol use: No  ? Drug use: No  ? Sexual activity: Not on file  ? ? ? ? ? ?

## 2022-03-24 NOTE — Telephone Encounter (Signed)
Will submit in June, 2023. Next available gel injection needs to be after 05/17/2022. ?

## 2022-04-13 ENCOUNTER — Ambulatory Visit (INDEPENDENT_AMBULATORY_CARE_PROVIDER_SITE_OTHER): Payer: Medicare Other

## 2022-04-13 DIAGNOSIS — I420 Dilated cardiomyopathy: Secondary | ICD-10-CM

## 2022-04-13 LAB — CUP PACEART REMOTE DEVICE CHECK
Battery Remaining Longevity: 105 mo
Battery Voltage: 2.99 V
Brady Statistic AP VP Percent: 54.8 %
Brady Statistic AP VS Percent: 0 %
Brady Statistic AS VP Percent: 45.19 %
Brady Statistic AS VS Percent: 0 %
Brady Statistic RA Percent Paced: 54.62 %
Brady Statistic RV Percent Paced: 99.99 %
Date Time Interrogation Session: 20230606031146
Implantable Lead Implant Date: 20200608
Implantable Lead Implant Date: 20200608
Implantable Lead Location: 753859
Implantable Lead Location: 753860
Implantable Lead Model: 5076
Implantable Lead Model: 5076
Implantable Pulse Generator Implant Date: 20200608
Lead Channel Impedance Value: 361 Ohm
Lead Channel Impedance Value: 380 Ohm
Lead Channel Impedance Value: 380 Ohm
Lead Channel Impedance Value: 437 Ohm
Lead Channel Pacing Threshold Amplitude: 0.75 V
Lead Channel Pacing Threshold Amplitude: 0.875 V
Lead Channel Pacing Threshold Pulse Width: 0.4 ms
Lead Channel Pacing Threshold Pulse Width: 0.4 ms
Lead Channel Sensing Intrinsic Amplitude: 15.375 mV
Lead Channel Sensing Intrinsic Amplitude: 15.375 mV
Lead Channel Sensing Intrinsic Amplitude: 2.625 mV
Lead Channel Sensing Intrinsic Amplitude: 2.625 mV
Lead Channel Setting Pacing Amplitude: 1.5 V
Lead Channel Setting Pacing Amplitude: 2.5 V
Lead Channel Setting Pacing Pulse Width: 0.4 ms
Lead Channel Setting Sensing Sensitivity: 2.8 mV

## 2022-04-19 ENCOUNTER — Other Ambulatory Visit: Payer: Self-pay | Admitting: Cardiology

## 2022-04-27 NOTE — Progress Notes (Signed)
Remote pacemaker transmission.   

## 2022-05-20 ENCOUNTER — Telehealth: Payer: Self-pay

## 2022-05-20 NOTE — Telephone Encounter (Signed)
VOB submitted for Monovisc, bilateral knee. BV pending 

## 2022-05-28 ENCOUNTER — Other Ambulatory Visit: Payer: Self-pay

## 2022-05-28 DIAGNOSIS — M170 Bilateral primary osteoarthritis of knee: Secondary | ICD-10-CM

## 2022-07-13 ENCOUNTER — Ambulatory Visit (INDEPENDENT_AMBULATORY_CARE_PROVIDER_SITE_OTHER): Payer: Medicare Other

## 2022-07-13 DIAGNOSIS — I420 Dilated cardiomyopathy: Secondary | ICD-10-CM | POA: Diagnosis not present

## 2022-07-16 LAB — CUP PACEART REMOTE DEVICE CHECK
Battery Remaining Longevity: 101 mo
Battery Voltage: 2.99 V
Brady Statistic AP VP Percent: 51.45 %
Brady Statistic AP VS Percent: 0 %
Brady Statistic AS VP Percent: 48.55 %
Brady Statistic AS VS Percent: 0 %
Brady Statistic RA Percent Paced: 51.3 %
Brady Statistic RV Percent Paced: 100 %
Date Time Interrogation Session: 20230905042951
Implantable Lead Implant Date: 20200608
Implantable Lead Implant Date: 20200608
Implantable Lead Location: 753859
Implantable Lead Location: 753860
Implantable Lead Model: 5076
Implantable Lead Model: 5076
Implantable Pulse Generator Implant Date: 20200608
Lead Channel Impedance Value: 361 Ohm
Lead Channel Impedance Value: 380 Ohm
Lead Channel Impedance Value: 399 Ohm
Lead Channel Impedance Value: 437 Ohm
Lead Channel Pacing Threshold Amplitude: 0.75 V
Lead Channel Pacing Threshold Amplitude: 0.75 V
Lead Channel Pacing Threshold Pulse Width: 0.4 ms
Lead Channel Pacing Threshold Pulse Width: 0.4 ms
Lead Channel Sensing Intrinsic Amplitude: 15.375 mV
Lead Channel Sensing Intrinsic Amplitude: 15.375 mV
Lead Channel Sensing Intrinsic Amplitude: 2.25 mV
Lead Channel Sensing Intrinsic Amplitude: 2.25 mV
Lead Channel Setting Pacing Amplitude: 1.5 V
Lead Channel Setting Pacing Amplitude: 2.5 V
Lead Channel Setting Pacing Pulse Width: 0.4 ms
Lead Channel Setting Sensing Sensitivity: 2.8 mV

## 2022-07-19 ENCOUNTER — Other Ambulatory Visit: Payer: Self-pay | Admitting: Cardiology

## 2022-07-20 ENCOUNTER — Other Ambulatory Visit: Payer: Self-pay

## 2022-07-21 ENCOUNTER — Ambulatory Visit: Payer: Medicare Other | Admitting: Orthopedic Surgery

## 2022-08-04 NOTE — Progress Notes (Signed)
Remote pacemaker transmission.   

## 2022-08-11 ENCOUNTER — Other Ambulatory Visit: Payer: Self-pay | Admitting: Cardiology

## 2022-08-11 NOTE — Telephone Encounter (Signed)
Prescription refill request for Eliquis received. Indication:  PAF Last office visit:  07/07/21  Ashok Norris MD Scr: 0.92 on 07/07/21 Age: 66 Weight: 113.7kg  Based on above findings Eliquis '5mg'$  twice daily is the appropriate dose.  Refill approved x 1.  Pt is past due for appt with Dr Radford Pax.  Sent message to schedulers to make appt.  Will need CBC/BMP at that time.

## 2022-09-01 NOTE — Progress Notes (Unsigned)
Cardiology Office Note:    Date:  09/02/2022   ID:  Meagan Ryan, DOB Oct 03, 1956, MRN [id number]  PCP:  Kristen Loader, FNP   [facility] Providers Cardiologist:  None Electrophysiologist:  Will Meredith Leeds, MD     Referring MD: Kristen Loader, FNP   Chief Complaint: annual follow-up PAF, dilated CM  History of Present Illness:    Meagan Ryan is a 66 y.o. 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   DCM diagnosed in October 2016, echo at that time showed reduced EF 30 to 35%.  Subsequently she underwent further ischemic evaluation with a coronary CTA with morphology on 09/04/2015 that showed a coronary calcium score of 0, normal coronary origin with right dominance. Placed on BB, Entresto and Corlanor.  Repeat echocardiogram 12/14/2017 showed improved EF 50 to 62%, grade 2 diastolic dysfunction, normal valvular function.  Admission 04/2019 with IRSWN-46 complicated by symptomatic bradycardia with second-degree type II HB and intermittent CHB. Carvedilol was stopped. She underwent PPM implant by Dr. Curt Bears 04/16/2019.  Referred to pulmonology for lingering cough and DOE since [place] infection 2021.  Symptoms felt to be COVID related.  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   She was last seen in our office by Dr. Radford Pax on 07/07/2021 at which time she complained of weakness, nonexertional chest pressure lasting about a minute and resolving.  Pain occurred near location of previous breast surgery.  She reported diaphoresis with pain, but no nausea.  Pain takes her breath away. Described as sometimes sharp, sometimes pressure. Additional cardiac testing was ordered due to patient's symptoms.  2D echo 08/13/21 revealed  normal LV function with mild pulmonary hypertension.  Recommendation by Dr. Radford Pax to undergo PFTs with DLCO for PHTN as well as sleep study and VQ scan to rule out PE due to pulmonary hypertension. VQ was negative for PE.   Today, she is here alone for follow-up. Has to rest after 5 minutes of activity due to fatigue, shortness of breath. Has felt like this since COVID infection in 2021. Was initially told she had sleep apnea and went on CPAP which caused chronic sinus infections. Mask was changed, but she continued to have infections. Repeat sleep test x 2 showed no sleep apnea. She denies chest pain, lower extremity edema, palpitations, melena, hematuria, hemoptysis, diaphoresis, weakness, presyncope, syncope, orthopnea, and PND. Has pain at site of bilateral previous breast surgeries and right shoulder pain. Has attempted to lose weight on GLP1 agonist medications but was not able to tolerate the higher dose and the lower dose was not available. Weight stable at home, no evidence of fluid overload.    Past Medical History:  Diagnosis Date   Allergic rhinitis    Asthma    Breast cancer ([place]) 1996   Left breast-stage II infiltrating ductal carcinoma, 3/8 axillary lymph nodes were positive for metastatic carcinoma, getting yearly mammogram and every other year MRI followed by oncologist Dr Benay Spice   Complication of anesthesia    WAKE UP WITH HEADACHE, difficulty waking up   DCM (dilated cardiomyopathy) ([place])    EF 55-60% by echo 04/2019   Dysuria 2011   Family history of adverse reaction to anesthesia    "family wakes up with personality changes, but are very short term"  Fatty liver CT 1/12   elevated LFT   Genetic testing 03/13/2018   Common cancers panel (47 genes) @ [facility] - No pathogenic mutations detected   H/O fibromyalgia    H/O osteopenia 2009   H/O peripheral neuropathy    H/O varicose veins    Hyperlipidemia    Hypertension    Incomplete RBBB    Meniere disease    deaf in  left ear    OSA (obstructive sleep apnea) 12/31/2015   Very mild with AHI 6/hr but elevated RDI at 32/hr.  Intolerant to PAP therapy   Pelvic pain in female    PMB (postmenopausal bleeding) 2011   Pregnancy induced hypertension    Right-sided lacunar infarction [facility])    MRI 1.2012   Tubular adenoma of colon     Past Surgical History:  Procedure Laterality Date   Gastroc muscle repair  2007   LAPAROSCOPIC CHOLECYSTECTOMY     MASTECTOMY  06/1995   Left - for infiltrating ductal carcinoma   MASTECTOMY W/ SENTINEL NODE BIOPSY Right 03/31/2018   MASTECTOMY W/ SENTINEL NODE BIOPSY Right 03/31/2018   Procedure: RIGHT TOTAL MASTECTOMY WITH AXILLARY SENTINEL LYMPH NODE BIOPSY ERAS PATHWAY;  Surgeon: Excell Seltzer, MD;  Location: [place];  Service: General;  Laterality: Right;   NEPHRECTOMY Right late 1990s   due to kidney shrinkage   PACEMAKER IMPLANT N/A 04/16/2019   Procedure: PACEMAKER IMPLANT;  Surgeon: Constance Haw, MD;  Location: [place] [facility];  Service: Cardiovascular;  Laterality: N/A;   ROBOTIC ASSISTED TOTAL HYSTERECTOMY WITH BILATERAL SALPINGO OOPHERECTOMY Bilateral 10/09/2015   Procedure: ROBOTIC ASSISTED BILATERAL SALPINGO OOPHORECTOMY ;  Surgeon: Everitt Amber, MD;  Location: [facility];  Service: Gynecology;  Laterality: Bilateral;   Tram surgery  1996   TUBAL LIGATION      Current Medications: Current Meds  Medication Sig   ALPRAZolam (XANAX) 0.5 MG tablet Take 0.5 mg by mouth 2 (two) times daily.   anastrozole (ARIMIDEX) 1 MG tablet TAKE 1 TABLET DAILY   apixaban (ELIQUIS) 5 MG TABS tablet Take 1 tablet (5 mg total) by mouth 2 (two) times daily. Needs to make an appt with Dr. Radford Pax for future refills.  Please call the office.   carvedilol (COREG) 12.5 MG tablet Take 1 tablet (12.5 mg total) by mouth 2 (two) times daily with a meal. Please call to schedule an overdue appointment with Dr. Radford Pax for refills, [phone], thank you. 2ND attempt.   cetirizine (ZYRTEC)  10 MG tablet Take 10 mg by mouth every morning.    CORLANOR 5 MG TABS tablet TAKE 1 TABLET TWICE A DAY   DULoxetine (CYMBALTA) 60 MG capsule Take 60 mg by mouth daily.   ENTRESTO 24-26 MG TAKE 1 TABLET TWICE A DAY   ezetimibe (ZETIA) 10 MG tablet Take 1 tablet (10 mg total) by mouth daily.   fluticasone (FLONASE) 50 MCG/ACT nasal spray Place 1 spray into both nostrils every morning.    gabapentin (NEURONTIN) 300 MG capsule Take 300 mg by mouth 2 (two) times daily.   niacin 500 MG tablet Take 500 mg by mouth daily.   Omega-3 Fatty Acids (FISH OIL) 1000 MG CAPS Take 2,000 mg by mouth 2 (two) times daily.   omeprazole (PRILOSEC) 20 MG capsule Take 20 mg by mouth daily.   potassium chloride (MICRO-K) 10 MEQ CR capsule Take 10 mEq by mouth daily.   predniSONE (DELTASONE) 10 MG tablet Take 1 tablet (10 mg total) by mouth daily with breakfast.  VITAMIN D PO Take 4,000 Units by mouth in the morning and at bedtime.      Allergies:   Amlodipine, Noroxin [norfloxacin], Cephalexin, Chocolate, Codeine, Sulfa antibiotics, Latex, and Penicillins   Social History   Socioeconomic History   Marital status: Widowed    Spouse name: Not on file   Number of children: Not on file   Years of education: Not on file   Highest education level: Not on file  Occupational History   Not on file  Tobacco Use   Smoking status: Never   Smokeless tobacco: Never  Vaping Use   Vaping Use: Never used  Substance and Sexual Activity   Alcohol use: No   Drug use: No   Sexual activity: Not on file  Other Topics Concern   Not on file  Social History Narrative   Tobacco Use: Never smoked - no tobacco exposure   No alcohol   Caffeine: Yes   No recreational drug use   Exercise: Minimal   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 Wears Ear plugs   Marital Status: Widowed   1 daughter   Social Determinants of Radio broadcast assistant Strain: Not on file  Food Insecurity: Not on file   Transportation Needs: Not on file  Physical Activity: Not on file  Stress: Not on file  Social Connections: Not on file     Family History: The patient's family history includes Breast cancer in her cousin and cousin; Breast cancer (age of onset: 62) in her paternal aunt; Colon cancer (age of onset: 69) in her sister; Diabetes in her mother; Heart disease in her mother; Hypertension in her mother; Prostate cancer (age of onset: 68) in her father; Stroke in her father and mother.  ROS:   Please see the history of present illness.   + fatigue + DOE All other systems reviewed and are negative.  Labs/Other Studies Reviewed:    The following studies were reviewed today:  Echo 08/13/21   1. Left ventricular ejection fraction, by estimation, is 60 to 65%. The  left ventricle has normal function. The left ventricle has no regional  wall motion abnormalities. Left ventricular diastolic parameters are  indeterminate.   2. Right ventricular systolic function is normal. The right ventricular  size is normal. There is mildly elevated pulmonary artery systolic  pressure. The estimated right ventricular systolic pressure is 40.8 mmHg.   3. The mitral valve is normal in structure. No evidence of mitral valve  regurgitation. No evidence of mitral stenosis.   4. The aortic valve is normal in structure. Aortic valve regurgitation is  not visualized. No aortic stenosis is present.   5. The inferior vena cava is normal in size with greater than 50%  respiratory variability, suggesting right atrial pressure of 3 mmHg.    Risk Assessment/Calculations:    CHA2DS2-VASc Score = 6  This indicates a 9.7% annual risk of stroke. The patient's score is based upon: CHF History: 1 HTN History: 1 Diabetes History: 0 Stroke History: 2 Vascular Disease History: 0 Age Score: 1 Gender Score: 1    Physical Exam:    VS:  BP 136/88   Pulse 64   Ht '5\' 4"'$  (1.626 m)   Wt 239 lb 9.6 oz (108.7 kg)   SpO2  95%   BMI 41.13 kg/m     Wt Readings from Last 3 Encounters:  09/02/22 239 lb 9.6 oz (108.7 kg)  07/07/21 250 lb 9.6 oz (113.7 kg)  06/17/21 255 lb (115.7 kg)     GEN:  Well developed, obese female in no acute distress HEENT: Normal NECK: No JVD; No carotid bruits CARDIAC: RRR, no murmurs, rubs, gallops RESPIRATORY:  Clear to auscultation without rales, wheezing or rhonchi  ABDOMEN: Soft, non-tender, non-distended MUSCULOSKELETAL:  No edema; No deformity. 2+ pedal pulses, equal bilaterally SKIN: Warm and dry NEUROLOGIC:  Alert and oriented x 3 PSYCHIATRIC:  Normal affect   EKG:  EKG is ordered today.  The ekg ordered today demonstrates v paced at 61 bpm  Diagnoses:    1. Pulmonary hypertension, unspecified ([place])   2. Morbid obesity (H[facility])   3. Paroxysmal atrial fibrillation ([place])   4. Symptomatic advanced heart block   5. DCM (dilated cardiomyopathy) ([place])   6. Hyperlipidemia, unspecified hyperlipidemia type   7. Secondary hypercoagulable state ([place])   8. Pacemaker    Assessment and Plan:     DCM: LVEF 60-65% on echo 08/13/21. DOE present for years, feels that is stable. No orthopnea or PND. No evidence of volume overload on exam. Weight has been stable at home. Continue Entresto, Corlanor, carvedilol.   PAF on chronic anticoagulation: No evidence of frequent atrial fibrillation on pacemaker report. She is asymptomatic.  No bleeding problems on Eliquis.  Continue Eliquis and carvedilol.  PPM implant s/p heart block: Has maintained consistent remote device checks.  Is due to see EP provider which we will schedule today.  Pulmonary hypertension: Mildly elevated pulmonary artery systolic pressure with estimated RVSP 40.9 mmHg, normal RV size on echo 08/13/21. Dyspnea has been stable. Will repeat echo when clinically indicated.   Hypertension: BP is well controlled. Home diastolic BP is better at home. No medication changes today.     Hyperlipidemia LDL goal < 100: LDL 221 on  03/10/22. Coronary calcium score of 0 in 2016. Reports intolerance of statins.  Currently on fish oil. Agreeable to try Zetia 10 mg once daily. We will recheck fasting lipid and liver panel in 2 months.   Morbid obesity: She continues to struggle with weight loss. Mobility is limited by bilateral Baker's cysts. Exercises for 5 minutes and has to stop.  Reports diet is healthy, she avoids sugar and high sodium foods. Cooks most meals at home.  Encouraged her to continue to try to increase activity endurance.    Disposition: 1 year with Dr. Radford Pax  Medication Adjustments/Labs and Tests Ordered: Current medicines are reviewed at length with the patient today.  Concerns regarding medicines are outlined above.  Orders Placed This Encounter  Procedures   Lipid Profile   Hepatic function panel   EKG 12-Lead   Meds ordered this encounter  Medications   ezetimibe (ZETIA) 10 MG tablet    Sig: Take 1 tablet (10 mg total) by mouth daily.    Dispense:  90 tablet    Refill:  3    Patient Instructions  Medication Instructions:   START Zetia one (1 ) tablet by mouth ( 10 mg) daily.   *If you need a refill on your cardiac medications before your next appointment, please call your pharmacy*  Lab Work:  Your physician recommends that you return for a FASTING lipid profile/lft on Wednesday, January 10. You can come in on the day of your appointment anytime between 7:30-4:30 fasting from midnight the night before.    If you have labs (blood work) drawn today and your tests are completely normal, you will receive your results only by: [place] (if you have MyChart) OR  A paper copy in the mail If you have any lab test that is abnormal or we need to change your treatment, we will call you to review the results.   Testing/Procedures:  None ordered.   Follow-Up: At [facility], you and your health needs are our priority.  As part of our continuing mission to provide you with  exceptional heart care, we have created designated Provider Care Teams.  These Care Teams include your primary Cardiologist (physician) and Advanced Practice Providers (APPs -  Physician Assistants and Nurse Practitioners) who all work together to provide you with the care you need, when you need it.  We recommend signing up for the patient portal called "MyChart".  Sign up information is provided on this After Visit Summary.  MyChart is used to connect with patients for Virtual Visits (Telemedicine).  Patients are able to view lab/test results, encounter notes, upcoming appointments, etc.  Non-urgent messages can be sent to your provider as well.   To learn more about what you can do with MyChart, go to [email or web address].    Your next appointment:   1 year(s)  The format for your next appointment:   In Person  Provider:   Golden Hurter, MD    Other Instructions  Your physician wants you to follow-up in: 1 year with Dr. Radford Pax.  You will receive a reminder letter in the mail two months in advance. If you don't receive a letter, please call our office to schedule the follow-up appointment.   Important Information About Sugar         Signed, Emmaline Life, NP  09/02/2022 5:05 PM    [place]

## 2022-09-02 ENCOUNTER — Ambulatory Visit: Payer: Medicare Other | Attending: Nurse Practitioner | Admitting: Nurse Practitioner

## 2022-09-02 ENCOUNTER — Encounter: Payer: Self-pay | Admitting: Nurse Practitioner

## 2022-09-02 VITALS — BP 136/88 | HR 64 | Ht 64.0 in | Wt 239.6 lb

## 2022-09-02 DIAGNOSIS — I480 Paroxysmal atrial fibrillation: Secondary | ICD-10-CM | POA: Diagnosis not present

## 2022-09-02 DIAGNOSIS — I272 Pulmonary hypertension, unspecified: Secondary | ICD-10-CM | POA: Diagnosis not present

## 2022-09-02 DIAGNOSIS — I441 Atrioventricular block, second degree: Secondary | ICD-10-CM | POA: Diagnosis not present

## 2022-09-02 DIAGNOSIS — I420 Dilated cardiomyopathy: Secondary | ICD-10-CM | POA: Diagnosis present

## 2022-09-02 DIAGNOSIS — Z950 Presence of cardiac pacemaker: Secondary | ICD-10-CM

## 2022-09-02 DIAGNOSIS — E785 Hyperlipidemia, unspecified: Secondary | ICD-10-CM

## 2022-09-02 DIAGNOSIS — D6869 Other thrombophilia: Secondary | ICD-10-CM | POA: Diagnosis present

## 2022-09-02 MED ORDER — EZETIMIBE 10 MG PO TABS: 10.0000 mg | ORAL_TABLET | Freq: Every day | ORAL | 3 refills | Status: DC

## 2022-09-02 NOTE — Patient Instructions (Signed)
Medication Instructions:   START Zetia one (1 ) tablet by mouth ( 10 mg) daily.   *If you need a refill on your cardiac medications before your next appointment, please call your pharmacy*  Lab Work:  Your physician recommends that you return for a FASTING lipid profile/lft on Wednesday, January 10. You can come in on the day of your appointment anytime between 7:30-4:30 fasting from midnight the night before.    If you have labs (blood work) drawn today and your tests are completely normal, you will receive your results only by: [place] (if you have MyChart) OR A paper copy in the mail If you have any lab test that is abnormal or we need to change your treatment, we will call you to review the results.   Testing/Procedures:  None ordered.   Follow-Up: At [facility], you and your health needs are our priority.  As part of our continuing mission to provide you with exceptional heart care, we have created designated Provider Care Teams.  These Care Teams include your primary Cardiologist (physician) and Advanced Practice Providers (APPs -  Physician Assistants and Nurse Practitioners) who all work together to provide you with the care you need, when you need it.  We recommend signing up for the patient portal called "MyChart".  Sign up information is provided on this After Visit Summary.  MyChart is used to connect with patients for Virtual Visits (Telemedicine).  Patients are able to view lab/test results, encounter notes, upcoming appointments, etc.  Non-urgent messages can be sent to your provider as well.   To learn more about what you can do with MyChart, go to [email or web address].    Your next appointment:   1 year(s)  The format for your next appointment:   In Person  Provider:   Golden Hurter, MD    Other Instructions  Your physician wants you to follow-up in: 1 year with Dr. Radford Pax.  You will receive a reminder letter in the mail two months in  advance. If you don't receive a letter, please call our office to schedule the follow-up appointment.   Important Information About Sugar

## 2022-09-03 NOTE — Addendum Note (Signed)
Addended by: Gaetano Net on: 09/03/2022 09:45 AM   Modules accepted: Orders

## 2022-09-08 ENCOUNTER — Other Ambulatory Visit: Payer: Self-pay | Admitting: Cardiology

## 2022-09-23 ENCOUNTER — Other Ambulatory Visit: Payer: Self-pay | Admitting: Cardiology

## 2022-10-06 ENCOUNTER — Ambulatory Visit: Payer: Medicare Other | Admitting: Physician Assistant

## 2022-10-11 ENCOUNTER — Other Ambulatory Visit: Payer: Self-pay | Admitting: Cardiology

## 2022-10-11 NOTE — Telephone Encounter (Signed)
Prescription refill request for Eliquis received. Indication:afib Last office visit:10/23 Scr:0.8 Age: 66 Weight:108.7  kg  Prescription refilled

## 2022-10-12 ENCOUNTER — Ambulatory Visit (INDEPENDENT_AMBULATORY_CARE_PROVIDER_SITE_OTHER): Payer: Medicare Other

## 2022-10-12 ENCOUNTER — Other Ambulatory Visit: Payer: Self-pay

## 2022-10-12 DIAGNOSIS — I420 Dilated cardiomyopathy: Secondary | ICD-10-CM | POA: Diagnosis not present

## 2022-10-12 LAB — CUP PACEART REMOTE DEVICE CHECK
Battery Remaining Longevity: 96 mo
Battery Voltage: 2.99 V
Brady Statistic AP VP Percent: 55.9 %
Brady Statistic AP VS Percent: 0 %
Brady Statistic AS VP Percent: 44.01 %
Brady Statistic AS VS Percent: 0.08 %
Brady Statistic RA Percent Paced: 55.89 %
Brady Statistic RV Percent Paced: 99.92 %
Date Time Interrogation Session: 20231205011638
Implantable Lead Connection Status: 753985
Implantable Lead Connection Status: 753985
Implantable Lead Implant Date: 20200608
Implantable Lead Implant Date: 20200608
Implantable Lead Location: 753859
Implantable Lead Location: 753860
Implantable Lead Model: 5076
Implantable Lead Model: 5076
Implantable Pulse Generator Implant Date: 20200608
Lead Channel Impedance Value: 342 Ohm
Lead Channel Impedance Value: 361 Ohm
Lead Channel Impedance Value: 361 Ohm
Lead Channel Impedance Value: 418 Ohm
Lead Channel Pacing Threshold Amplitude: 0.75 V
Lead Channel Pacing Threshold Amplitude: 0.75 V
Lead Channel Pacing Threshold Pulse Width: 0.4 ms
Lead Channel Pacing Threshold Pulse Width: 0.4 ms
Lead Channel Sensing Intrinsic Amplitude: 15.375 mV
Lead Channel Sensing Intrinsic Amplitude: 15.375 mV
Lead Channel Sensing Intrinsic Amplitude: 2 mV
Lead Channel Sensing Intrinsic Amplitude: 2 mV
Lead Channel Setting Pacing Amplitude: 1.5 V
Lead Channel Setting Pacing Amplitude: 2.5 V
Lead Channel Setting Pacing Pulse Width: 0.4 ms
Lead Channel Setting Sensing Sensitivity: 2.8 mV
Zone Setting Status: 755011
Zone Setting Status: 755011

## 2022-10-12 MED ORDER — CARVEDILOL 12.5 MG PO TABS: 12.5000 mg | ORAL_TABLET | Freq: Two times a day (BID) | ORAL | 3 refills | Status: DC

## 2022-10-12 NOTE — Telephone Encounter (Signed)
Patient was notified her carvedilol refill was faxed to her [place].

## 2022-11-09 NOTE — Progress Notes (Signed)
Remote pacemaker transmission.   

## 2022-11-17 ENCOUNTER — Other Ambulatory Visit: Payer: Medicare Other

## 2022-12-16 ENCOUNTER — Encounter (HOSPITAL_COMMUNITY): Payer: Self-pay | Admitting: *Deleted

## 2023-01-11 ENCOUNTER — Ambulatory Visit (INDEPENDENT_AMBULATORY_CARE_PROVIDER_SITE_OTHER): Payer: Medicare Other

## 2023-01-11 DIAGNOSIS — I420 Dilated cardiomyopathy: Secondary | ICD-10-CM | POA: Diagnosis not present

## 2023-01-12 LAB — CUP PACEART REMOTE DEVICE CHECK
Battery Remaining Longevity: 94 mo
Battery Voltage: 2.98 V
Brady Statistic AP VP Percent: 42.04 %
Brady Statistic AP VS Percent: 0 %
Brady Statistic AS VP Percent: 57.94 %
Brady Statistic AS VS Percent: 0.02 %
Brady Statistic RA Percent Paced: 41.99 %
Brady Statistic RV Percent Paced: 99.98 %
Date Time Interrogation Session: 20240305010934
Implantable Lead Connection Status: 753985
Implantable Lead Connection Status: 753985
Implantable Lead Implant Date: 20200608
Implantable Lead Implant Date: 20200608
Implantable Lead Location: 753859
Implantable Lead Location: 753860
Implantable Lead Model: 5076
Implantable Lead Model: 5076
Implantable Pulse Generator Implant Date: 20200608
Lead Channel Impedance Value: 380 Ohm
Lead Channel Impedance Value: 380 Ohm
Lead Channel Impedance Value: 399 Ohm
Lead Channel Impedance Value: 437 Ohm
Lead Channel Pacing Threshold Amplitude: 0.75 V
Lead Channel Pacing Threshold Amplitude: 0.875 V
Lead Channel Pacing Threshold Pulse Width: 0.4 ms
Lead Channel Pacing Threshold Pulse Width: 0.4 ms
Lead Channel Sensing Intrinsic Amplitude: 15.375 mV
Lead Channel Sensing Intrinsic Amplitude: 15.375 mV
Lead Channel Sensing Intrinsic Amplitude: 2 mV
Lead Channel Sensing Intrinsic Amplitude: 2 mV
Lead Channel Setting Pacing Amplitude: 1.5 V
Lead Channel Setting Pacing Amplitude: 2.5 V
Lead Channel Setting Pacing Pulse Width: 0.4 ms
Lead Channel Setting Sensing Sensitivity: 2.8 mV
Zone Setting Status: 755011
Zone Setting Status: 755011

## 2023-02-09 ENCOUNTER — Ambulatory Visit: Payer: Medicare Other | Admitting: Nurse Practitioner

## 2023-02-09 NOTE — Progress Notes (Deleted)
Cardiology Office Note:    Date:  02/09/2023   ID:  Meagan Ryan, DOB [birth year], MRN [id number]  PCP:  Kristen Loader, FNP   [facility] Providers Cardiologist:  None Electrophysiologist:  Will Meredith Leeds, MD     Referring MD: Kristen Loader, FNP   Chief Complaint: annual follow-up PAF, dilated CM  History of Present Illness:    Meagan Ryan is a 67 y.o. 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   DCM diagnosed in October 2016, echo at that time showed reduced EF 30 to 35%.  Subsequently she underwent further ischemic evaluation with a coronary CTA with morphology on 09/04/2015 that showed a coronary calcium score of 0, normal coronary origin with right dominance. Placed on BB, Entresto and Corlanor.  Repeat echocardiogram 12/14/2017 showed improved EF 50 to [id number], grade 2 diastolic dysfunction, normal valvular function.  Admission 04/2019 with [id number] complicated by symptomatic bradycardia with second-degree type II HB and intermittent CHB. Carvedilol was stopped. She underwent PPM implant by Dr. Curt Bears 04/16/2019.  Referred to pulmonology for lingering cough and DOE since Blunt infection 2021.  Symptoms felt to be COVID related.  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   Seen in our office by Dr. Radford Pax on 07/07/2021 at which time she complained of weakness, nonexertional chest pressure lasting about a minute and resolving.  Pain occurred near location of previous breast surgery.  She reported diaphoresis with pain, but no nausea. Pain takes her breath away. Described as sometimes sharp, sometimes pressure. Additional cardiac testing was ordered due to patient's symptoms.  2D echo 08/13/21 revealed normal LV  function with mild pulmonary hypertension. Recommendation by Dr. Radford Pax to undergo PFTs with DLCO for PHTN as well as sleep study and VQ scan to rule out PE due to pulmonary hypertension. VQ was negative for PE.   Seen in cardiology clinic by me on 09/02/2022. Reported need to rest after 5 minutes of activity due to fatigue, shortness of breath. Has felt like this since COVID infection in 2021. Was initially told she had sleep apnea and went on CPAP which caused chronic sinus infections. Mask was changed, but she continued to have infections. Repeat sleep test x 2 showed no sleep apnea.No chest pain, lower extremity edema, palpitations, melena, hematuria, hemoptysis, diaphoresis, weakness, presyncope, syncope, orthopnea, and PND. Has pain at site of bilateral previous breast surgeries and right shoulder pain. Has attempted to lose weight on GLP1 agonist medications but was not able to tolerate the higher dose and the lower dose was not available. Weight stable at home, no evidence of fluid overload.    Today, he is here   Past Medical History:  Diagnosis Date   Allergic rhinitis    Asthma    Breast cancer ([place]) 1996   Left breast-stage II infiltrating ductal carcinoma, 3/8 axillary lymph nodes were positive for metastatic carcinoma, getting yearly mammogram and every other year MRI followed by oncologist Dr Benay Spice   Complication of anesthesia    WAKE UP WITH HEADACHE, difficulty waking up   DCM (dilated cardiomyopathy) ([place])    EF 55-60% by echo 04/2019   Dysuria 2011   Family history of adverse reaction to anesthesia    "family wakes up with personality changes, but are very short term"  Fatty liver CT 1/12   elevated LFT   Genetic testing 03/13/2018   Common cancers panel (47 genes) @ [facility] - No pathogenic mutations detected   H/O fibromyalgia    H/O osteopenia 2009   H/O peripheral neuropathy    H/O varicose veins    Hyperlipidemia    Hypertension    Incomplete RBBB    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  Intolerant to PAP therapy   Pelvic pain in female    PMB (postmenopausal bleeding) 2011   Pregnancy induced hypertension    Right-sided lacunar infarction [facility])    MRI 1.2012   Tubular adenoma of colon     Past Surgical History:  Procedure Laterality Date   Gastroc muscle repair  2007   LAPAROSCOPIC CHOLECYSTECTOMY     MASTECTOMY  06/1995   Left - for infiltrating ductal carcinoma   MASTECTOMY W/ SENTINEL NODE BIOPSY Right 03/31/2018   MASTECTOMY W/ SENTINEL NODE BIOPSY Right 03/31/2018   Procedure: RIGHT TOTAL MASTECTOMY WITH AXILLARY SENTINEL LYMPH NODE BIOPSY ERAS PATHWAY;  Surgeon: Excell Seltzer, MD;  Location: [facility];  Service: General;  Laterality: Right;   NEPHRECTOMY Right late 1990s   due to kidney shrinkage   PACEMAKER IMPLANT N/A 04/16/2019   Procedure: PACEMAKER IMPLANT;  Surgeon: Constance Haw, MD;  Location: [place] [facility];  Service: Cardiovascular;  Laterality: N/A;   ROBOTIC ASSISTED TOTAL HYSTERECTOMY WITH BILATERAL SALPINGO OOPHERECTOMY Bilateral 10/09/2015   Procedure: ROBOTIC ASSISTED BILATERAL SALPINGO OOPHORECTOMY ;  Surgeon: Everitt Amber, MD;  Location: [facility];  Service: Gynecology;  Laterality: Bilateral;   Tram surgery  1996   TUBAL LIGATION      Current Medications: No outpatient medications have been marked as taking for the 02/11/23 encounter (Appointment) with Ann Maki, Lanice Schwab, NP.     Allergies:   Amlodipine, Noroxin [norfloxacin], Cephalexin, Chocolate, Codeine, Sulfa antibiotics, Latex, and Penicillins   Social History   Socioeconomic History   Marital status: Widowed    Spouse name: Not on file   Number of children: Not on file   Years of education: Not on file   Highest education level: Not on file  Occupational History   Not on file  Tobacco Use   Smoking status: Never   Smokeless tobacco: Never   Vaping Use   Vaping Use: Never used  Substance and Sexual Activity   Alcohol use: No   Drug use: No   Sexual activity: Not on file  Other Topics Concern   Not on file  Social History Narrative   Tobacco Use: Never smoked - no tobacco exposure   No alcohol   Caffeine: Yes   No recreational drug use   Exercise: Minimal   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 Wears Ear plugs   Marital Status: Widowed   1 daughter   Social Determinants of Radio broadcast assistant Strain: Not on file  Food Insecurity: Not on file  Transportation Needs: Not on file  Physical Activity: Not on file  Stress: Not on file  Social Connections: Not on file     Family History: The patient's family history includes Breast cancer in her cousin and cousin; Breast cancer (age of onset: 27) in her paternal aunt; Colon cancer (age of onset: 57) in her sister; Diabetes in her mother; Heart disease in her mother;  Hypertension in her mother; Prostate cancer (age of onset: 88) in her father; Stroke in her father and mother.  ROS:   Please see the history of present illness.   *** All other systems reviewed and are negative.  Labs/Other Studies Reviewed:    The following studies were reviewed today:  Echo 08/13/21   1. Left ventricular ejection fraction, by estimation, is 60 to 65%. The  left ventricle has normal function. The left ventricle has no regional  wall motion abnormalities. Left ventricular diastolic parameters are  indeterminate.   2. Right ventricular systolic function is normal. The right ventricular  size is normal. There is mildly elevated pulmonary artery systolic  pressure. The estimated right ventricular systolic pressure is 99991111 mmHg.   3. The mitral valve is normal in structure. No evidence of mitral valve  regurgitation. No evidence of mitral stenosis.   4. The aortic valve is normal in structure. Aortic valve regurgitation is  not visualized. No  aortic stenosis is present.   5. The inferior vena cava is normal in size with greater than 50%  respiratory variability, suggesting right atrial pressure of 3 mmHg.    Risk Assessment/Calculations:    CHA2DS2-VASc Score = 6  This indicates a 9.7% annual risk of stroke. The patient's score is based upon: CHF History: 1 HTN History: 1 Diabetes History: 0 Stroke History: 2 Vascular Disease History: 0 Age Score: 1 Gender Score: 1    Physical Exam:    VS:  There were no vitals taken for this visit.    Wt Readings from Last 3 Encounters:  09/02/22 239 lb 9.6 oz (108.7 kg)  07/07/21 250 lb 9.6 oz (113.7 kg)  06/17/21 255 lb (115.7 kg)     GEN:  Well developed, obese female in no acute distress HEENT: Normal NECK: No JVD; No carotid bruits CARDIAC: RRR, no murmurs, rubs, gallops RESPIRATORY:  Clear to auscultation without rales, wheezing or rhonchi  ABDOMEN: Soft, non-tender, non-distended MUSCULOSKELETAL:  No edema; No deformity. 2+ pedal pulses, equal bilaterally SKIN: Warm and dry NEUROLOGIC:  Alert and oriented x 3 PSYCHIATRIC:  Normal affect   EKG:  EKG is ordered today.  The ekg ordered today demonstrates v paced at 61 bpm  Diagnoses:    No diagnosis found.  Assessment and Plan:     DCM: LVEF 60-65% on echo 08/13/21. DOE present for years, feels that is stable. No orthopnea or PND. No evidence of volume overload on exam. Weight has been stable at home. Continue Entresto, Corlanor, carvedilol.   PAF on chronic anticoagulation: No evidence of frequent atrial fibrillation on pacemaker report. She is asymptomatic.  No bleeding problems on Eliquis.  Continue Eliquis and carvedilol.  PPM implant s/p heart block: Has maintained consistent remote device checks.  Is due to see EP provider which we will schedule today.  Pulmonary hypertension: Mildly elevated pulmonary artery systolic pressure with estimated RVSP 40.9 mmHg, normal RV size on echo 08/13/21. Dyspnea has been  stable. Will repeat echo when clinically indicated.   Hypertension: BP is well controlled. Home diastolic BP is better at home. No medication changes today.     Hyperlipidemia LDL goal < 100: LDL 221 on 03/10/22. Coronary calcium score of 0 in 2016. Reports intolerance of statins.  Currently on fish oil. Agreeable to try Zetia 10 mg once daily. We will recheck fasting lipid and liver panel in 2 months.   Morbid obesity: She continues to struggle with weight loss. Mobility is limited by bilateral  Baker's cysts. Exercises for 5 minutes and has to stop.  Reports diet is healthy, she avoids sugar and high sodium foods. Cooks most meals at home.  Encouraged her to continue to try to increase activity endurance.    Disposition: ***  Medication Adjustments/Labs and Tests Ordered: Current medicines are reviewed at length with the patient today.  Concerns regarding medicines are outlined above.  No orders of the defined types were placed in this encounter.  No orders of the defined types were placed in this encounter.   There are no Patient Instructions on file for this visit.   Signed, Emmaline Life, NP  02/09/2023 5:55 AM    [place]

## 2023-02-11 ENCOUNTER — Ambulatory Visit: Payer: Medicare Other | Admitting: Nurse Practitioner

## 2023-02-16 NOTE — Progress Notes (Signed)
Remote pacemaker transmission.   

## 2023-04-02 NOTE — Progress Notes (Signed)
Cardiology Office Note:    Date:  04/05/2023   ID:  Meagan Ryan, DOB October 23, 1956, MRN [id number]  PCP:  Soundra Pilon, FNP   [facility] Providers Cardiologist:  Armanda Magic, MD Electrophysiologist:  Will Jorja Loa, MD     Referring MD: Soundra Pilon, FNP   Chief Complaint: fatigue  History of Present Illness:    Meagan Ryan is a 67 y.o. 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   DCM diagnosed in October 2016, echo at that time showed reduced EF 30 to 35%.  Subsequently she underwent further ischemic evaluation with a coronary CTA with morphology on 09/04/2015 that showed a coronary calcium score of 0, normal coronary origin with right dominance. Placed on BB, Entresto and Corlanor.  Repeat echocardiogram 12/14/2017 showed improved EF 50 to 55%, grade 2 diastolic dysfunction, normal valvular function.  Admission 04/2019 with COVID-19 complicated by symptomatic bradycardia with second-degree type II HB and intermittent CHB. Carvedilol was stopped. She underwent PPM implant by Dr. Elberta Fortis 04/16/2019.  Referred to pulmonology for lingering cough and DOE since COVID infection 2021.  Symptoms felt to be COVID related.  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   Seen in our office by Dr. Mayford Knife on 07/07/2021 at which time she complained of weakness, nonexertional chest pressure lasting about a minute and resolving.  Pain occurred near location of previous breast surgery.  She reported diaphoresis with pain, but no nausea.  Pain takes her breath away. Described as sometimes sharp, sometimes pressure. Additional cardiac testing was ordered due to patient's symptoms.  2D echo 08/13/21 revealed normal LV function with  mild pulmonary hypertension.  Recommendation by Dr. Mayford Knife to undergo PFTs with DLCO for PHTN as well as sleep study and VQ scan to rule out PE due to pulmonary hypertension. VQ was negative for PE.   Seen in clinic by me 09/02/22 at which time she reported needs to rest after 5 minutes of activity due to fatigue, shortness of breath. Has felt like this since COVID infection in 2021. Was initially told she had sleep apnea and went on CPAP which caused chronic sinus infections. Mask was changed, but she continued to have infections. Repeat sleep test x 2 showed no sleep apnea.No chest pain, lower extremity edema, palpitations, melena, hematuria, hemoptysis, diaphoresis, weakness, presyncope, syncope, orthopnea, and PND. Has pain at site of bilateral previous breast surgeries and right shoulder pain. Has attempted to lose weight on GLP1 agonist medications but was not able to tolerate the higher dose and the lower dose was not available. Weight stable at home, no evidence of fluid overload. We started Zetia for elevated LDL with plan to repeat FLP/ALT in 2 months. 1 year follow-up was recommended.  She was advised to follow-up with EP provider due to overdue follow-up for PPM.  Today, she is here for evaluation of worsening fatigue. States she has to sit down after 5 minutes of house work, "feels like I have worked for 10 hours." Occasional lightheadedness and shortness of breath are associated with fatigue. No presyncope or syncope. Occasional chest pains, more often on right side of chest, are not associated with fatigue. No orthopnea, PND, edema. Intentional weight loss on Wegovy but no improvement in symptoms with weight loss. Reports she sleeps well, her  daughter has told her that her snoring has improved. Has the desire to complete activities but can only work for a brief time without stopping. Ends up sitting on the sofa most of the day. Can barely stand long enough to take a shower. Has tried to increase  intensity of walking - not able to achieve > 5 minutes of walking. Cannot climb stairs. Tries to go shopping, ends up in a wheelchair. Was very tired after walking in from parking lot today. Was only able to walk a few feet in the clinic with supervision, O2 sat down to 93%. No bleeding concerns.   Past Medical History:  Diagnosis Date   Allergic rhinitis    Asthma    Breast cancer (HCC) 1996   Left breast-stage II infiltrating ductal carcinoma, 3/8 axillary lymph nodes were positive for metastatic carcinoma, getting yearly mammogram and every other year MRI followed by oncologist Dr Truett Perna   Complication of anesthesia    WAKE UP WITH HEADACHE, difficulty waking up   DCM (dilated cardiomyopathy) (HCC)    EF 55-60% by echo 04/2019   Dysuria 2011   Family history of adverse reaction to anesthesia    "family wakes up with personality changes, but are very short term"   Fatty liver CT 1/12   elevated LFT   Genetic testing 03/13/2018   Common cancers panel (47 genes) @ [facility] - No pathogenic mutations detected   H/O fibromyalgia    H/O osteopenia 2009   H/O peripheral neuropathy    H/O varicose veins    Hyperlipidemia    Hypertension    Incomplete RBBB    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  Intolerant to PAP therapy   Pelvic pain in female    PMB (postmenopausal bleeding) 2011   Pregnancy induced hypertension    Right-sided lacunar infarction [facility])    MRI 1.2012   Tubular adenoma of colon     Past Surgical History:  Procedure Laterality Date   Gastroc muscle repair  2007   LAPAROSCOPIC CHOLECYSTECTOMY     MASTECTOMY  06/1995   Left - for infiltrating ductal carcinoma   MASTECTOMY W/ SENTINEL NODE BIOPSY Right 03/31/2018   MASTECTOMY W/ SENTINEL NODE BIOPSY Right 03/31/2018   Procedure: RIGHT TOTAL MASTECTOMY WITH AXILLARY SENTINEL LYMPH NODE BIOPSY ERAS PATHWAY;  Surgeon:  Glenna Fellows, MD;  Location: [facility] OR;  Service: General;  Laterality: Right;   NEPHRECTOMY Right late 1990s   due to kidney shrinkage   PACEMAKER IMPLANT N/A 04/16/2019   Procedure: PACEMAKER IMPLANT;  Surgeon: Regan Lemming, MD;  Location: [facility] INVASIVE CV LAB;  Service: Cardiovascular;  Laterality: N/A;   ROBOTIC ASSISTED TOTAL HYSTERECTOMY WITH BILATERAL SALPINGO OOPHERECTOMY Bilateral 10/09/2015   Procedure: ROBOTIC ASSISTED BILATERAL SALPINGO OOPHORECTOMY ;  Surgeon: Adolphus Birchwood, MD;  Location: [facility] ORS;  Service: Gynecology;  Laterality: Bilateral;   Tram surgery  1996   TUBAL LIGATION      Current Medications: Current Meds  Medication Sig   ALPRAZolam (XANAX) 0.5 MG tablet Take 0.5 mg by mouth 2 (two) times daily.   anastrozole (ARIMIDEX) 1 MG tablet TAKE 1 TABLET DAILY   apixaban (ELIQUIS) 5 MG TABS tablet Take 1 tablet (5 mg total) by mouth 2 (two) times daily.   carvedilol (COREG) 12.5 MG tablet Take 1 tablet (12.5 mg total) by mouth 2 (two) times daily  with a meal.   cetirizine (ZYRTEC) 10 MG tablet Take 10 mg by mouth every morning.    CORLANOR 5 MG TABS tablet TAKE 1 TABLET TWICE A DAY   DULoxetine (CYMBALTA) 60 MG capsule Take 60 mg by mouth daily.   ENTRESTO 24-26 MG TAKE 1 TABLET TWICE A DAY   ezetimibe (ZETIA) 10 MG tablet Take 1 tablet (10 mg total) by mouth daily.   fluticasone (FLONASE) 50 MCG/ACT nasal spray Place 1 spray into both nostrils every morning.    gabapentin (NEURONTIN) 300 MG capsule Take 300 mg by mouth 2 (two) times daily.   hydrochlorothiazide (HYDRODIURIL) 25 MG tablet Take by mouth.   niacin 500 MG tablet Take 500 mg by mouth daily.   omeprazole (PRILOSEC) 20 MG capsule Take 20 mg by mouth daily.   potassium chloride (MICRO-K) 10 MEQ CR capsule Take 10 mEq by mouth daily.   predniSONE (DELTASONE) 10 MG tablet Take 1 tablet (10 mg total) by mouth daily with breakfast.   WEGOVY 0.5 MG/0.5ML SOAJ      Allergies:   Amlodipine, Noroxin  [norfloxacin], Cephalexin, Chocolate, Codeine, Sulfa antibiotics, Latex, and Penicillins   Social History   Socioeconomic History   Marital status: Widowed    Spouse name: Not on file   Number of children: Not on file   Years of education: Not on file   Highest education level: Not on file  Occupational History   Not on file  Tobacco Use   Smoking status: Never   Smokeless tobacco: Never  Vaping Use   Vaping Use: Never used  Substance and Sexual Activity   Alcohol use: No   Drug use: No   Sexual activity: Not on file  Other Topics Concern   Not on file  Social History Narrative   Tobacco Use: Never smoked - no tobacco exposure   No alcohol   Caffeine: Yes   No recreational drug use   Exercise: Minimal   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 Wears Ear plugs   Marital Status: Widowed   1 daughter   Social Determinants of Corporate investment banker Strain: Not on file  Food Insecurity: Not on file  Transportation Needs: Not on file  Physical Activity: Not on file  Stress: Not on file  Social Connections: Not on file     Family History: The patient's family history includes Breast cancer in her cousin and cousin; Breast cancer (age of onset: 3) in her paternal aunt; Colon cancer (age of onset: 18) in her sister; Diabetes in her mother; Heart disease in her mother; Hypertension in her mother; Prostate cancer (age of onset: 64) in her father; Stroke in her father and mother.  ROS:   Please see the history of present illness.   + fatigue + shortness of breath All other systems reviewed and are negative.  Labs/Other Studies Reviewed:    The following studies were reviewed today:  Echo 08/13/21   1. Left ventricular ejection fraction, by estimation, is 60 to 65%. The  left ventricle has normal function. The left ventricle has no regional  wall motion abnormalities. Left ventricular diastolic parameters are  indeterminate.   2. Right  ventricular systolic function is normal. The right ventricular  size is normal. There is mildly elevated pulmonary artery systolic  pressure. The estimated right ventricular systolic pressure is 40.9 mmHg.   3. The mitral valve is normal in structure. No evidence of mitral valve  regurgitation. No evidence  of mitral stenosis.   4. The aortic valve is normal in structure. Aortic valve regurgitation is  not visualized. No aortic stenosis is present.   5. The inferior vena cava is normal in size with greater than 50%  respiratory variability, suggesting right atrial pressure of 3 mmHg.    Risk Assessment/Calculations:    CHA2DS2-VASc Score = 6  This indicates a 9.7% annual risk of stroke. The patient's score is based upon: CHF History: 1 HTN History: 1 Diabetes History: 0 Stroke History: 2 Vascular Disease History: 0 Age Score: 1 Gender Score: 1    Physical Exam:    VS:  BP 126/88   Pulse 64   Ht 5\' 4"  (1.626 m)   Wt 221 lb (100.2 kg)   SpO2 98%   BMI 37.93 kg/m     Wt Readings from Last 3 Encounters:  04/05/23 221 lb (100.2 kg)  09/02/22 239 lb 9.6 oz (108.7 kg)  07/07/21 250 lb 9.6 oz (113.7 kg)     GEN:  Well developed, obese female in no acute distress HEENT: Normal NECK: No JVD; No carotid bruits CARDIAC: RRR, no murmurs, rubs, gallops RESPIRATORY:  Clear to auscultation without rales, wheezing or rhonchi  ABDOMEN: Soft, non-tender, non-distended MUSCULOSKELETAL:  No edema; No deformity. 2+ pedal pulses, equal bilaterally SKIN: Warm and dry NEUROLOGIC:  Alert and oriented x 3 PSYCHIATRIC:  Normal affect   EKG:  EKG is not ordered today  Diagnoses:    1. Pulmonary hypertension, unspecified (HCC)   2. DCM (dilated cardiomyopathy) (HCC)   3. Paroxysmal atrial fibrillation (HCC)   4. Hyperlipidemia, unspecified hyperlipidemia type   5. Chronic diastolic CHF (congestive heart failure) (HCC)   6. DOE (dyspnea on exertion)   7. Other fatigue   8. Morbid  obesity (HCC)   9. Vitamin D deficiency   10. Pacemaker     Assessment and Plan:     Fatigue: She reports significant fatigue with minimal activity which has progressed since last office visit 08/2022. Has attempted to increase her stamina but cannot walk for more than 5 minutes without symptoms of fatigue and shortness of breath. No presyncope or syncope.  We discussed many potential causes of fatigue including anemia, thyroid dysfunction, and vitamin deficiencies. Will check TSH, CBC, CMET, vit D, and vit B12 today.  Will repeat echocardiogram to evaluate for heart or valve dysfunction. No evidence of CAD on coronary CTA 08/2015. Consider further ischemia evaluation if symptoms persist.  Shortness of breath/DCM: Echo 08/13/21 revealed LVEF 60-65%, indeterminate diastolic parameters. Feeling fatigued and short of breath with minimal exertion. No edema, orthopnea, weight gain or PND. Recent weight loss on Wegovy. No evidence of volume overload on exam. We attempted to walk her down the hall to observe stamina as well as monitor O2 sat and she was only able to walk about 50 feet before saying she was too tired to continue. O2 sats down to 93%, improved with rest. Continue Entresto, Corlanor, carvedilol.   PAF on chronic anticoagulation: HR is well controlled. No evidence of frequent atrial fibrillation on pacemaker report. No reports of palpitations or chest discomfort. No bleeding problems on Eliquis. Continue Eliquis 5 mg twice daily which is appropriate dose for stroke prevention for CHA2DS2-VASc score of 6. Continue carvedilol and Corlanor for rate control.   PPM implant s/p heart block: Normal device function on remote device checks, most recently completed 01/11/23. Due to see EP provider which we will schedule today.  Pulmonary hypertension: Mildly elevated pulmonary  artery systolic pressure with estimated RVSP 40.9 mmHg, normal RV size on echo 08/13/21. Shortness of breath and fatigue have  worsened. Will repeat echo for evaluation.   Hypertension: BP is well controlled. No medication changes today.     Hyperlipidemia LDL goal < 100: LDL 221 on 03/10/22. Reported prior intolerance to statins. Was started on Zetia 10 mg daily with plan to recheck lipid/LFT 2 months after therapy. Coronary calcium score of 0 in 2016. Reports intolerance of statins. We will recheck today.    Morbid obesity: Recent 18 lb weight loss on Wegovy. She thought her symptoms of fatigue and shortness of breath would improve with weight loss but they have not.  Has attempted to walk for exercise but can only walk for 5 minutes without stopping. Reports healthy diet. Management of weight loss per PCP.     Disposition: 2-3 months with me  Medication Adjustments/Labs and Tests Ordered: Current medicines are reviewed at length with the patient today.  Concerns regarding medicines are outlined above.  Orders Placed This Encounter  Procedures   TSH   CBC   Comp Met (CMET)   B12   Lipid Profile   Vitamin D (25 hydroxy)   ECHOCARDIOGRAM COMPLETE   No orders of the defined types were placed in this encounter.   Patient Instructions  Medication Instructions:   Your physician recommends that you continue on your current medications as directed. Please refer to the Current Medication list given to you today.   *If you need a refill on your cardiac medications before your next appointment, please call your pharmacy*   Lab Work:   TODAY!!!!! TSH/CBC/CMET/VIT D/VIT B12/LIPID.  If you have labs (blood work) drawn today and your tests are completely normal, you will receive your results only by: MyChart Message (if you have MyChart) OR A paper copy in the mail If you have any lab test that is abnormal or we need to change your treatment, we will call you to review the results.   Testing/Procedures:  Your physician has requested that you have an echocardiogram. Echocardiography is a painless test that  uses sound waves to create images of your heart. It provides your doctor with information about the size and shape of your heart and how well your heart's chambers and valves are working. This procedure takes approximately one hour. There are no restrictions for this procedure. Please do NOT wear cologne, perfume, or lotions (deodorant is allowed). Please arrive 15 minutes prior to your appointment time.    Follow-Up: At [facility], you and your health needs are our priority.  As part of our continuing mission to provide you with exceptional heart care, we have created designated Provider Care Teams.  These Care Teams include your primary Cardiologist (physician) and Advanced Practice Providers (APPs -  Physician Assistants and Nurse Practitioners) who all work together to provide you with the care you need, when you need it.  We recommend signing up for the patient portal called "MyChart".  Sign up information is provided on this After Visit Summary.  MyChart is used to connect with patients for Virtual Visits (Telemedicine).  Patients are able to view lab/test results, encounter notes, upcoming appointments, etc.  Non-urgent messages can be sent to your provider as well.   To learn more about what you can do with MyChart, go to [email or web address].    Your next appointment:   2 month(s)  Provider:   Francis Dowse, PA-C   Than August with [facility].  Signed, Levi Aland, NP  04/05/2023 12:46 PM    [facility]

## 2023-04-05 ENCOUNTER — Encounter: Payer: Self-pay | Admitting: Nurse Practitioner

## 2023-04-05 ENCOUNTER — Ambulatory Visit: Payer: Medicare Other | Attending: Nurse Practitioner | Admitting: Nurse Practitioner

## 2023-04-05 VITALS — BP 126/88 | HR 64 | Ht 64.0 in | Wt 221.0 lb

## 2023-04-05 DIAGNOSIS — I272 Pulmonary hypertension, unspecified: Secondary | ICD-10-CM | POA: Diagnosis present

## 2023-04-05 DIAGNOSIS — I5032 Chronic diastolic (congestive) heart failure: Secondary | ICD-10-CM | POA: Diagnosis present

## 2023-04-05 DIAGNOSIS — R0609 Other forms of dyspnea: Secondary | ICD-10-CM | POA: Diagnosis present

## 2023-04-05 DIAGNOSIS — R5383 Other fatigue: Secondary | ICD-10-CM | POA: Insufficient documentation

## 2023-04-05 DIAGNOSIS — I480 Paroxysmal atrial fibrillation: Secondary | ICD-10-CM | POA: Diagnosis present

## 2023-04-05 DIAGNOSIS — E785 Hyperlipidemia, unspecified: Secondary | ICD-10-CM | POA: Diagnosis present

## 2023-04-05 DIAGNOSIS — I420 Dilated cardiomyopathy: Secondary | ICD-10-CM | POA: Insufficient documentation

## 2023-04-05 DIAGNOSIS — Z950 Presence of cardiac pacemaker: Secondary | ICD-10-CM | POA: Diagnosis present

## 2023-04-05 DIAGNOSIS — E559 Vitamin D deficiency, unspecified: Secondary | ICD-10-CM | POA: Insufficient documentation

## 2023-04-05 NOTE — Patient Instructions (Signed)
Medication Instructions:   Your physician recommends that you continue on your current medications as directed. Please refer to the Current Medication list given to you today.   *If you need a refill on your cardiac medications before your next appointment, please call your pharmacy*   Lab Work:   TODAY!!!!! TSH/CBC/CMET/VIT D/VIT B12/LIPID.  If you have labs (blood work) drawn today and your tests are completely normal, you will receive your results only by: MyChart Message (if you have MyChart) OR A paper copy in the mail If you have any lab test that is abnormal or we need to change your treatment, we will call you to review the results.   Testing/Procedures:  Your physician has requested that you have an echocardiogram. Echocardiography is a painless test that uses sound waves to create images of your heart. It provides your doctor with information about the size and shape of your heart and how well your heart's chambers and valves are working. This procedure takes approximately one hour. There are no restrictions for this procedure. Please do NOT wear cologne, perfume, or lotions (deodorant is allowed). Please arrive 15 minutes prior to your appointment time.    Follow-Up: At [facility], you and your health needs are our priority.  As part of our continuing mission to provide you with exceptional heart care, we have created designated Provider Care Teams.  These Care Teams include your primary Cardiologist (physician) and Advanced Practice Providers (APPs -  Physician Assistants and Nurse Practitioners) who all work together to provide you with the care you need, when you need it.  We recommend signing up for the patient portal called "MyChart".  Sign up information is provided on this After Visit Summary.  MyChart is used to connect with patients for Virtual Visits (Telemedicine).  Patients are able to view lab/test results, encounter notes, upcoming appointments, etc.   Non-urgent messages can be sent to your provider as well.   To learn more about what you can do with MyChart, go to [email or web address].    Your next appointment:   2 month(s)  Provider:   Francis Dowse, PA-C   Than August with [facility].

## 2023-04-07 ENCOUNTER — Telehealth: Payer: Self-pay | Admitting: Cardiology

## 2023-04-07 LAB — CBC
Hematocrit: 43.8 % (ref 34.0–46.6)
Hemoglobin: 13.8 g/dL (ref 11.1–15.9)
MCH: 28.1 pg (ref 26.6–33.0)
MCHC: 31.5 g/dL (ref 31.5–35.7)
MCV: 89 fL (ref 79–97)
Platelets: 270 10*3/uL (ref 150–450)
RBC: 4.91 x10E6/uL (ref 3.77–5.28)
RDW: 16.1 % — ABNORMAL HIGH (ref 11.7–15.4)
WBC: 6.2 10*3/uL (ref 3.4–10.8)

## 2023-04-07 LAB — COMPREHENSIVE METABOLIC PANEL
ALT: 15 IU/L (ref 0–32)
AST: 18 IU/L (ref 0–40)
Albumin/Globulin Ratio: 1.2 (ref 1.2–2.2)
Albumin: 3.6 g/dL — ABNORMAL LOW (ref 3.9–4.9)
Alkaline Phosphatase: 62 IU/L (ref 44–121)
BUN/Creatinine Ratio: 7 — ABNORMAL LOW (ref 12–28)
BUN: 8 mg/dL (ref 8–27)
Bilirubin Total: 0.7 mg/dL (ref 0.0–1.2)
CO2: 26 mmol/L (ref 20–29)
Calcium: 10 mg/dL (ref 8.7–10.3)
Chloride: 99 mmol/L (ref 96–106)
Creatinine, Ser: 1.09 mg/dL — ABNORMAL HIGH (ref 0.57–1.00)
Globulin, Total: 2.9 g/dL (ref 1.5–4.5)
Glucose: 86 mg/dL (ref 70–99)
Potassium: 4.4 mmol/L (ref 3.5–5.2)
Sodium: 137 mmol/L (ref 134–144)
Total Protein: 6.5 g/dL (ref 6.0–8.5)
eGFR: 55 mL/min/{1.73_m2} — ABNORMAL LOW (ref >59)

## 2023-04-07 LAB — LIPID PANEL
Chol/HDL Ratio: 4.6 ratio — ABNORMAL HIGH (ref 0.0–4.4)
Cholesterol, Total: 196 mg/dL (ref 100–199)
HDL: 43 mg/dL (ref >39)
LDL Chol Calc (NIH): 125 mg/dL — ABNORMAL HIGH (ref 0–99)
Triglycerides: 158 mg/dL — ABNORMAL HIGH (ref 0–149)
VLDL Cholesterol Cal: 28 mg/dL (ref 5–40)

## 2023-04-07 LAB — TSH: TSH: 3.97 u[IU]/mL (ref 0.450–4.500)

## 2023-04-07 LAB — VITAMIN B12: Vitamin B-12: 165 pg/mL — ABNORMAL LOW (ref 232–1245)

## 2023-04-07 LAB — VITAMIN D 25 HYDROXY (VIT D DEFICIENCY, FRACTURES): Vit D, 25-Hydroxy: 57.7 ng/mL (ref 30.0–100.0)

## 2023-04-07 NOTE — Telephone Encounter (Signed)
Patient returned CMA's call regarding results. 

## 2023-04-07 NOTE — Telephone Encounter (Signed)
Results given, see labs from 04/05/23.

## 2023-04-12 ENCOUNTER — Ambulatory Visit (INDEPENDENT_AMBULATORY_CARE_PROVIDER_SITE_OTHER): Payer: Medicare Other

## 2023-04-12 DIAGNOSIS — I441 Atrioventricular block, second degree: Secondary | ICD-10-CM

## 2023-04-12 LAB — CUP PACEART REMOTE DEVICE CHECK
Battery Remaining Longevity: 88 mo
Battery Voltage: 2.98 V
Brady Statistic AP VP Percent: 74.31 %
Brady Statistic AP VS Percent: 0 %
Brady Statistic AS VP Percent: 25.68 %
Brady Statistic AS VS Percent: 0.01 %
Brady Statistic RA Percent Paced: 74.28 %
Brady Statistic RV Percent Paced: 99.99 %
Date Time Interrogation Session: 20240604021138
Implantable Lead Connection Status: 753985
Implantable Lead Connection Status: 753985
Implantable Lead Implant Date: 20200608
Implantable Lead Implant Date: 20200608
Implantable Lead Location: 753859
Implantable Lead Location: 753860
Implantable Lead Model: 5076
Implantable Lead Model: 5076
Implantable Pulse Generator Implant Date: 20200608
Lead Channel Impedance Value: 361 Ohm
Lead Channel Impedance Value: 380 Ohm
Lead Channel Impedance Value: 399 Ohm
Lead Channel Impedance Value: 418 Ohm
Lead Channel Pacing Threshold Amplitude: 0.75 V
Lead Channel Pacing Threshold Amplitude: 0.875 V
Lead Channel Pacing Threshold Pulse Width: 0.4 ms
Lead Channel Pacing Threshold Pulse Width: 0.4 ms
Lead Channel Sensing Intrinsic Amplitude: 15.375 mV
Lead Channel Sensing Intrinsic Amplitude: 15.375 mV
Lead Channel Sensing Intrinsic Amplitude: 2 mV
Lead Channel Sensing Intrinsic Amplitude: 2 mV
Lead Channel Setting Pacing Amplitude: 1.5 V
Lead Channel Setting Pacing Amplitude: 2.5 V
Lead Channel Setting Pacing Pulse Width: 0.4 ms
Lead Channel Setting Sensing Sensitivity: 2.8 mV
Zone Setting Status: 755011
Zone Setting Status: 755011

## 2023-04-20 ENCOUNTER — Telehealth: Payer: Self-pay | Admitting: Nurse Practitioner

## 2023-04-20 NOTE — Telephone Encounter (Signed)
Will you please contact her at your convenience and ask if she has follow-up with her PCP for low vitamin b12. She may need to have B12 injections, but I am not certain at what level they recommend starting those. I asked her to follow-up with PCP.   Thank you.

## 2023-04-21 NOTE — Telephone Encounter (Signed)
Lvm to see if pt f/u with pcp on low vit b.

## 2023-04-25 ENCOUNTER — Ambulatory Visit (HOSPITAL_COMMUNITY): Payer: Medicare Other | Attending: Cardiology

## 2023-04-25 DIAGNOSIS — I480 Paroxysmal atrial fibrillation: Secondary | ICD-10-CM | POA: Insufficient documentation

## 2023-04-25 DIAGNOSIS — I420 Dilated cardiomyopathy: Secondary | ICD-10-CM | POA: Diagnosis present

## 2023-04-25 DIAGNOSIS — I5032 Chronic diastolic (congestive) heart failure: Secondary | ICD-10-CM | POA: Insufficient documentation

## 2023-04-25 DIAGNOSIS — R0609 Other forms of dyspnea: Secondary | ICD-10-CM | POA: Diagnosis present

## 2023-04-25 DIAGNOSIS — E559 Vitamin D deficiency, unspecified: Secondary | ICD-10-CM | POA: Insufficient documentation

## 2023-04-25 DIAGNOSIS — R5383 Other fatigue: Secondary | ICD-10-CM | POA: Diagnosis present

## 2023-04-25 DIAGNOSIS — E785 Hyperlipidemia, unspecified: Secondary | ICD-10-CM | POA: Insufficient documentation

## 2023-04-25 DIAGNOSIS — I272 Pulmonary hypertension, unspecified: Secondary | ICD-10-CM | POA: Diagnosis not present

## 2023-04-25 LAB — ECHOCARDIOGRAM COMPLETE
Area-P 1/2: 2.54 cm2
S' Lateral: 2.8 cm

## 2023-05-03 ENCOUNTER — Encounter: Payer: Self-pay | Admitting: Physician Assistant

## 2023-05-03 ENCOUNTER — Ambulatory Visit: Payer: Medicare Other | Admitting: Physician Assistant

## 2023-05-03 VITALS — BP 140/94 | HR 64 | Ht 64.0 in | Wt 221.8 lb

## 2023-05-03 DIAGNOSIS — H8109 Meniere's disease, unspecified ear: Secondary | ICD-10-CM

## 2023-05-03 DIAGNOSIS — F322 Major depressive disorder, single episode, severe without psychotic features: Secondary | ICD-10-CM

## 2023-05-03 DIAGNOSIS — R400 Somnolence: Secondary | ICD-10-CM | POA: Diagnosis not present

## 2023-05-03 DIAGNOSIS — Z6841 Body Mass Index (BMI) 40.0 and over, adult: Secondary | ICD-10-CM

## 2023-05-03 DIAGNOSIS — F411 Generalized anxiety disorder: Secondary | ICD-10-CM

## 2023-05-03 DIAGNOSIS — E538 Deficiency of other specified B group vitamins: Secondary | ICD-10-CM

## 2023-05-03 DIAGNOSIS — G4733 Obstructive sleep apnea (adult) (pediatric): Secondary | ICD-10-CM

## 2023-05-03 DIAGNOSIS — I10 Essential (primary) hypertension: Secondary | ICD-10-CM

## 2023-05-03 DIAGNOSIS — I5022 Chronic systolic (congestive) heart failure: Secondary | ICD-10-CM

## 2023-05-03 DIAGNOSIS — Z853 Personal history of malignant neoplasm of breast: Secondary | ICD-10-CM

## 2023-05-03 DIAGNOSIS — Z79899 Other long term (current) drug therapy: Secondary | ICD-10-CM

## 2023-05-03 MED ORDER — ALPRAZOLAM 0.5 MG PO TABS: 0.5000 mg | ORAL_TABLET | Freq: Two times a day (BID) | ORAL | 0 refills | Status: AC | PRN

## 2023-05-03 MED ORDER — AUVELITY 45-105 MG PO TBCR: 1.0000 | EXTENDED_RELEASE_TABLET | Freq: Two times a day (BID) | ORAL | 1 refills | Status: DC

## 2023-05-03 NOTE — Progress Notes (Signed)
[facility] MD/PA/NP Initial Note  05/03/2023 5:57 PM Meagan Ryan  MRN:  [id number]  Chief Complaint:  Chief Complaint   Establish Care    HPI: Referred by her dtr, Consuello Bossier.  Meagan Ryan came in for the last few minutes of the appointment to discuss treatment options.  States she doesn't like to leave the house, doesn't have the energy to do it but she does go sometimes, stays in the house most of time, watches tv or listens to music or goes to bed. Has been like this for about 12 years since her husband died. But then 'covid nearly did me in.' Her sister died from it. They were close and used to do stuff together. Then Gretel had Covid herself in 2021 or 2022, she cannot remember exactly.  After that she became so fatigued that she did not even feel like getting out of bed. Very tired all the time. Doesn't enjoy anything.  Personal hygiene is normal.  She puts off chores like laundry or dishes.  In the past she did those things all the time.   Denies any changes in concentration, making decisions, or remembering things.  She is now on Wegovy to help her lose weight.  It is helping with her appetite somewhat.  Denies suicidal or homicidal thoughts.  She has been on Xanax for 27 years.  She tried to go off it at some point in the recent past, but she got so ill and aggravated that her daughter had her go back on it.  She is not having panic attacks or anxiety at this time but feels that it is due to the Xanax.  She does not know how bad the anxiety would be if she was not taking it, and she does not want to take a chance on it.  She has Mnire's disease and this has likely helped with that as well.  For the fatigue, she has recently seen her cardiologist, w/u nl for her, saw her oncologist (h/o breast CA) and no recurrence they can tell. Will see PCP tomorrow. Labs were drawn a few weeks ago. B12 Def but no Rx txment yet. Has started oral B 12 pills.  Patient denies increased energy with decreased  need for sleep, increased talkativeness, racing thoughts, impulsivity or risky behaviors, increased spending, increased libido, grandiosity, increased irritability or anger, paranoia, or hallucinations.  Visit Diagnosis:    ICD-10-CM   1. Severe depression (HCC)  F32.2     2. Generalized anxiety disorder  F41.1     3. Somnolence  R40.0     4. Morbid obesity with BMI of 40.0-44.9, adult (HCC)  E66.01    Z68.41     5. History of breast cancer in female  Z40.3     6. Meniere's disease, unspecified laterality  H81.09     7. OSA (obstructive sleep apnea)  G47.33     8. Chronic systolic CHF (congestive heart failure), NYHA class 2 (HCC)  I50.22     9. Benign essential HTN  I10     10. B12 deficiency  E53.8     11. Long term prescription benzodiazepine use  Z79.899      Past Psychiatric History:   Past medications for mental health diagnoses include: On Xanax for 27 years, no others  Saw a counselor a few times when she was dx w/ CA a few times  Past Medical History:  Past Medical History:  Diagnosis Date   Allergic rhinitis    Asthma  Breast cancer (HCC) 1996   Left breast-stage II infiltrating ductal carcinoma, 3/8 axillary lymph nodes were positive for metastatic carcinoma, getting yearly mammogram and every other year MRI followed by oncologist Dr Truett Perna   Complication of anesthesia    WAKE UP WITH HEADACHE, difficulty waking up   DCM (dilated cardiomyopathy) (HCC)    EF 55-60% by echo 04/2019   Dysuria 2011   Family history of adverse reaction to anesthesia    "family wakes up with personality changes, but are very short term"   Fatty liver CT 1/12   elevated LFT   Genetic testing 03/13/2018   Common cancers panel (47 genes) @ [facility] - No pathogenic mutations detected   H/O fibromyalgia    H/O osteopenia 2009   H/O peripheral neuropathy    H/O varicose veins    Hyperlipidemia    Hypertension    Incomplete RBBB    Meniere disease    deaf in left ear     OSA (obstructive sleep apnea) 12/31/2015   Very mild with AHI 6/hr but elevated RDI at 32/hr.  Intolerant to PAP therapy   Pelvic pain in female    PMB (postmenopausal bleeding) 2011   Pregnancy induced hypertension    Right-sided lacunar infarction [facility])    MRI 1.2012   Tubular adenoma of colon     Past Surgical History:  Procedure Laterality Date   Gastroc muscle repair  2007   LAPAROSCOPIC CHOLECYSTECTOMY     MASTECTOMY  06/1995   Left - for infiltrating ductal carcinoma   MASTECTOMY W/ SENTINEL NODE BIOPSY Right 03/31/2018   MASTECTOMY W/ SENTINEL NODE BIOPSY Right 03/31/2018   Procedure: RIGHT TOTAL MASTECTOMY WITH AXILLARY SENTINEL LYMPH NODE BIOPSY ERAS PATHWAY;  Surgeon: Glenna Fellows, MD;  Location: [facility] OR;  Service: General;  Laterality: Right;   NEPHRECTOMY Right late 1990s   due to kidney shrinkage   PACEMAKER IMPLANT N/A 04/16/2019   Procedure: PACEMAKER IMPLANT;  Surgeon: Regan Lemming, MD;  Location: [facility] INVASIVE CV LAB;  Service: Cardiovascular;  Laterality: N/A;   ROBOTIC ASSISTED TOTAL HYSTERECTOMY WITH BILATERAL SALPINGO OOPHERECTOMY Bilateral 10/09/2015   Procedure: ROBOTIC ASSISTED BILATERAL SALPINGO OOPHORECTOMY ;  Surgeon: Adolphus Birchwood, MD;  Location: [facility] ORS;  Service: Gynecology;  Laterality: Bilateral;   Tram surgery  1996   TUBAL LIGATION      Family Psychiatric History:  See below  Family History:  Family History  Problem Relation Age of Onset   Heart disease Mother        MI   Stroke Mother    Diabetes Mother    Hypertension Mother    Blindness Mother    Stroke Father    Prostate cancer Father 47       deceased 59   Colon cancer Sister 66       currently 68   Breast cancer Paternal Aunt 33       deceased 3s   Breast cancer Cousin        daughter of pat aunt w/ breast ca   Breast cancer Cousin        daughter of pat aunt w/ breast ca   Anxiety disorder Daughter    Depression Daughter     Social History:  Social History    Socioeconomic History   Marital status: Widowed    Spouse name: Not on file   Number of children: Not on file   Years of education: Not on file   Highest education level: Not on  file  Occupational History   Not on file  Tobacco Use   Smoking status: Never   Smokeless tobacco: Never  Vaping Use   Vaping Use: Never used  Substance and Sexual Activity   Alcohol use: No   Drug use: No   Sexual activity: Not on file  Other Topics Concern   Not on file  Social History Narrative   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 Is a widow.  Husband had Bipolar disorder.    Has one dtr, Meagan Ryan who is a Facilities manager at [employer].  Pt lives with her daughter and son-in-law and granddaughter.     Grew up in [place]. 2 sisters, one died from covid several years ago.       Caffeine-1 cup coffee   Legal-none   Religion-Christian   Social Determinants of Health   Financial Resource Strain: Low Risk  (05/03/2023)   Overall Financial Resource Strain (CARDIA)    Difficulty of Paying Living Expenses: Not hard at all  Food Insecurity: No Food Insecurity (05/03/2023)   Hunger Vital Sign    Worried About Running Out of Food in the Last Year: Never true    Ran Out of Food in the Last Year: Never true  Transportation Needs: No Transportation Needs (05/03/2023)   PRAPARE - Administrator, Civil Service (Medical): No    Lack of Transportation (Non-Medical): No  Physical Activity: Inactive (05/03/2023)   Exercise Vital Sign    Days of Exercise per Week: 0 days    Minutes of Exercise per Session: 0 min  Stress: No Stress Concern Present (05/03/2023)   [employer] of Occupational Health - Occupational Stress Questionnaire    Feeling of Stress : Only a little  Social Connections: Socially Isolated (05/03/2023)   Social Connection and Isolation Panel [NHANES]    Frequency of Communication with Friends and Family: Once a week    Frequency of Social Gatherings with Friends and Family: Never     Attends Religious Services: Never    Database administrator or Organizations: No    Attends Banker Meetings: Never    Marital Status: Widowed    Allergies:  Allergies  Allergen Reactions   Amlodipine Shortness Of Breath   Noroxin [Norfloxacin] Shortness Of Breath   Cephalexin Other (See Comments) and Nausea And Vomiting    Fever got to 105 High temperature   Chocolate     Migraines    Codeine Nausea And Vomiting   Sulfa Antibiotics Nausea And Vomiting   Latex Itching and Rash    redness   Penicillins Rash    Has patient had a PCN reaction causing immediate rash, facial/tongue/throat swelling, SOB or lightheadedness with hypotension:No Has patient had a PCN reaction causing severe rash involving mucus membranes or skin necrosis: Yes Has patient had a PCN reaction that required hospitalization No Has patient had a PCN reaction occurring within the last 10 years: No If all of the above answers are "NO", then may proceed with Cephalosporin use.     Metabolic Disorder Labs: No results found for: "HGBA1C", "MPG" No results found for: "PROLACTIN" Lab Results  Component Value Date   CHOL 196 04/05/2023   TRIG 158 (H) 04/05/2023   HDL 43 04/05/2023   CHOLHDL 4.6 (H) 04/05/2023   LDLCALC 125 (H) 04/05/2023   Lab Results  Component Value Date   TSH 3.970 04/05/2023   TSH 5.440 (H) 07/10/2020    Therapeutic Level Labs: No results  found for: "LITHIUM" No results found for: "VALPROATE" No results found for: "CBMZ"  Current Medications: Current Outpatient Medications  Medication Sig Dispense Refill   anastrozole (ARIMIDEX) 1 MG tablet TAKE 1 TABLET DAILY 90 tablet 3   apixaban (ELIQUIS) 5 MG TABS tablet Take 1 tablet (5 mg total) by mouth 2 (two) times daily. 60 tablet 11   carvedilol (COREG) 12.5 MG tablet Take 1 tablet (12.5 mg total) by mouth 2 (two) times daily with a meal. 180 tablet 3   cetirizine (ZYRTEC) 10 MG tablet Take 10 mg by mouth every  morning.      CORLANOR 5 MG TABS tablet TAKE 1 TABLET TWICE A DAY 180 tablet 3   Cyanocobalamin (VITAMIN B 12 PO) Take by mouth.     Dextromethorphan-buPROPion ER (AUVELITY) 45-105 MG TBCR Take 1 tablet by mouth 2 (two) times daily. 60 tablet 1   DULoxetine (CYMBALTA) 60 MG capsule Take 60 mg by mouth daily.     ENTRESTO 24-26 MG TAKE 1 TABLET TWICE A DAY 180 tablet 3   ezetimibe (ZETIA) 10 MG tablet Take 1 tablet (10 mg total) by mouth daily. 90 tablet 3   fluticasone (FLONASE) 50 MCG/ACT nasal spray Place 1 spray into both nostrils every morning.      hydrochlorothiazide (HYDRODIURIL) 25 MG tablet Take by mouth.     niacin 500 MG tablet Take 500 mg by mouth daily.     omeprazole (PRILOSEC) 20 MG capsule Take 20 mg by mouth daily.     potassium chloride (MICRO-K) 10 MEQ CR capsule Take 10 mEq by mouth daily.     predniSONE (DELTASONE) 10 MG tablet Take 1 tablet (10 mg total) by mouth daily with breakfast. 30 tablet 1   VITAMIN D PO Take 4,000 Units by mouth in the morning and at bedtime.     WEGOVY 0.5 MG/0.5ML SOAJ      ALPRAZolam (XANAX) 0.5 MG tablet Take 1 tablet (0.5 mg total) by mouth 2 (two) times daily as needed for anxiety. 180 tablet 0   gabapentin (NEURONTIN) 300 MG capsule Take 300 mg by mouth 2 (two) times daily.     Omega-3 Fatty Acids (FISH OIL) 1000 MG CAPS Take 2,000 mg by mouth 2 (two) times daily. (Patient not taking: Reported on 04/05/2023)     No current facility-administered medications for this visit.    Medication Side Effects: none  Orders placed this visit:  No orders of the defined types were placed in this encounter.   Psychiatric Specialty Exam:  Review of Systems  Constitutional:  Positive for fatigue.  HENT:  Positive for hearing loss and tinnitus.   Eyes: Negative.   Respiratory:  Positive for cough, shortness of breath and wheezing.   Cardiovascular:  Positive for chest pain and leg swelling.  Gastrointestinal:  Positive for abdominal pain,  constipation, diarrhea and nausea.  Endocrine: Negative.   Genitourinary: Negative.   Musculoskeletal:  Positive for arthralgias and myalgias.  Skin:  Positive for rash.  Allergic/Immunologic: Positive for environmental allergies.  Neurological:  Positive for dizziness, tremors, weakness and headaches.  Psychiatric/Behavioral:         See HPI    Blood pressure (!) 140/94, pulse 64, height 5\' 4"  (1.626 m), weight 221 lb 12.8 oz (100.6 kg).Body mass index is 38.07 kg/m.  General Appearance: Casual, Well Groomed, and Obese  Eye Contact:  Good  Speech:  Clear and Coherent and Normal Rate  Volume:  Normal  Mood:  Depressed  Affect:  Depressed  Thought Process:  Goal Directed and Descriptions of Associations: Circumstantial  Orientation:  Full (Time, Place, and Person)  Thought Content: Logical   Suicidal Thoughts:  No  Homicidal Thoughts:  No  Memory:  WNL  Judgement:  Good  Insight:  Good  Psychomotor Activity:  Normal  Concentration:  Concentration: Good  Recall:  Good  Fund of Knowledge: Good  Language: Good  Assets:  Financial Resources/Insurance Housing Social Support Transportation  ADL's:  Intact  Cognition: WNL  Prognosis:  Good   Labs from 04/05/2023 were reviewed.  Screenings:  PHQ2-9    Flowsheet Row Office Visit from 05/03/2023 in [facility]  PHQ-2 Total Score 6  PHQ-9 Total Score 22      Flowsheet Row ED from 06/17/2021 in [facility] Emergency Department at [facility]  C-SSRS RISK CATEGORY No Risk      Receiving Psychotherapy: No   Treatment Plan/Recommendations:  PDMP reviewed. Xanax 02/04/2023.  I provided 62 minutes of face to face time during this encounter, including time spent before and after the visit in records review, medical decision making, counseling pertinent to today's visit, and charting.   We had a long discussion about depression.  Treatment options for her situation would be to increase the  Cymbalta.  She has been on that for years for peripheral neuropathy.  That would be my first recommendation so we would not have to add another pill, however Wellbutrin would be a good option as well.  We could leave the Cymbalta at the same dose and add Wellbutrin.  Patient's daughter, who is also my patient, has recently started taking Auvelity and she has responded very well to it.  They wonder if that would be an option for her as well.  That is appropriate.  Benefits, risks and side effects were discussed and patient and her daughter accept.  A 2-week sample bottle was given and prescription sent to [facility].   We discussed the Xanax.  Really any benzodiazepine should not be taken after 69 years old, however she has been on this for nearly 3 decades and I see no reason to stop it at this point.  There may be a time where it would be appropriate to decrease the dose and stop it altogether if possible but we can discuss that later on.  She understands about the fall risks, also confusion and is willing to take the risk.  It may actually help with the imbalance though because of the Mnire's disease.  Her B12 is low.  She will discuss this with her PCP at her appointment tomorrow.  I am wondering if sublingual or injection would be more beneficial.  Continue Xanax 0.5 mg, 1 p.o. twice daily as needed. Start Auvelity 45-105 mg, 1 p.o. twice daily. Continue Cymbalta 60 mg, 1 p.o. daily. Continue gabapentin 300 mg, 1 p.o. twice daily. Continue vitamins as per medication list. Consider counseling. Return in 4 weeks.  Melony Overly, PA-C

## 2023-05-05 NOTE — Progress Notes (Signed)
Remote pacemaker transmission.   

## 2023-05-13 ENCOUNTER — Telehealth: Payer: Self-pay | Admitting: Physician Assistant

## 2023-05-16 NOTE — Telephone Encounter (Signed)
Error

## 2023-05-30 ENCOUNTER — Telehealth: Payer: Self-pay | Admitting: Physician Assistant

## 2023-05-30 NOTE — Telephone Encounter (Signed)
Daughter called having to change Meagan Ryan's appt for this week.  Meagan Ryan had a fall this weekend and needs some recover time.  She is now scheduled for 8/28. The Meagan Ryan is working well.  She has 1 more month of samples but it won't be enough to get to appt.  If you want to go ahead and prescribe so she won't be out please do so.

## 2023-05-31 MED ORDER — AUVELITY 45-105 MG PO TBCR: 1.0000 | EXTENDED_RELEASE_TABLET | Freq: Two times a day (BID) | ORAL | 1 refills | Status: AC

## 2023-05-31 NOTE — Telephone Encounter (Signed)
Sent to [facility].

## 2023-06-01 ENCOUNTER — Ambulatory Visit: Payer: Medicare Other | Admitting: Physician Assistant

## 2023-06-06 ENCOUNTER — Ambulatory Visit: Payer: Medicare Other | Admitting: Physician Assistant

## 2023-06-07 ENCOUNTER — Telehealth: Payer: Self-pay | Admitting: Physician Assistant

## 2023-06-07 NOTE — Telephone Encounter (Signed)
Pharmacy sent PA Request for AUVELITY , see CMM

## 2023-06-19 NOTE — Telephone Encounter (Signed)
Prior Approval received for [employer] PA# [id number] effective 05/20/2023-11/07/2098 with [facility]

## 2023-06-22 ENCOUNTER — Telehealth: Payer: Self-pay | Admitting: Physician Assistant

## 2023-06-22 NOTE — Telephone Encounter (Signed)
I spoke with her dtr, Elnita Maxwell. Pt will return prn.

## 2023-06-22 NOTE — Telephone Encounter (Signed)
Please see message from patient. She has not been on Abilify from what I seen in her medication history, suspect it is Auvelity.

## 2023-06-22 NOTE — Telephone Encounter (Signed)
Daughter called to say her mom stopped taking the abilify because it made her jittery. She is doing well now and will call back if she needs something different

## 2023-06-28 ENCOUNTER — Ambulatory Visit: Payer: Medicare Other | Admitting: Nurse Practitioner

## 2023-07-06 ENCOUNTER — Ambulatory Visit: Payer: Medicare Other | Admitting: Physician Assistant

## 2023-07-12 ENCOUNTER — Ambulatory Visit (INDEPENDENT_AMBULATORY_CARE_PROVIDER_SITE_OTHER): Payer: Medicare Other

## 2023-07-12 DIAGNOSIS — I420 Dilated cardiomyopathy: Secondary | ICD-10-CM

## 2023-07-12 DIAGNOSIS — I480 Paroxysmal atrial fibrillation: Secondary | ICD-10-CM | POA: Diagnosis not present

## 2023-07-12 LAB — CUP PACEART REMOTE DEVICE CHECK
Battery Remaining Longevity: 86 mo
Battery Voltage: 2.98 V
Brady Statistic AP VP Percent: 57.28 %
Brady Statistic AP VS Percent: 0 %
Brady Statistic AS VP Percent: 42.71 %
Brady Statistic AS VS Percent: 0.01 %
Brady Statistic RA Percent Paced: 57.24 %
Brady Statistic RV Percent Paced: 99.99 %
Date Time Interrogation Session: 20240903025825
Implantable Lead Connection Status: 753985
Implantable Lead Connection Status: 753985
Implantable Lead Implant Date: 20200608
Implantable Lead Implant Date: 20200608
Implantable Lead Location: 753859
Implantable Lead Location: 753860
Implantable Lead Model: 5076
Implantable Lead Model: 5076
Implantable Pulse Generator Implant Date: 20200608
Lead Channel Impedance Value: 342 Ohm
Lead Channel Impedance Value: 361 Ohm
Lead Channel Impedance Value: 380 Ohm
Lead Channel Impedance Value: 437 Ohm
Lead Channel Pacing Threshold Amplitude: 0.625 V
Lead Channel Pacing Threshold Amplitude: 0.875 V
Lead Channel Pacing Threshold Pulse Width: 0.4 ms
Lead Channel Pacing Threshold Pulse Width: 0.4 ms
Lead Channel Sensing Intrinsic Amplitude: 15.375 mV
Lead Channel Sensing Intrinsic Amplitude: 15.375 mV
Lead Channel Sensing Intrinsic Amplitude: 2.625 mV
Lead Channel Sensing Intrinsic Amplitude: 2.625 mV
Lead Channel Setting Pacing Amplitude: 1.5 V
Lead Channel Setting Pacing Amplitude: 2.5 V
Lead Channel Setting Pacing Pulse Width: 0.4 ms
Lead Channel Setting Sensing Sensitivity: 2.8 mV
Zone Setting Status: 755011
Zone Setting Status: 755011

## 2023-07-20 NOTE — Progress Notes (Signed)
Remote pacemaker transmission.   

## 2023-08-10 ENCOUNTER — Other Ambulatory Visit: Payer: Self-pay | Admitting: Nurse Practitioner

## 2023-08-16 ENCOUNTER — Other Ambulatory Visit: Payer: Self-pay | Admitting: Cardiology

## 2023-08-27 ENCOUNTER — Other Ambulatory Visit: Payer: Self-pay | Admitting: Cardiology

## 2023-09-23 ENCOUNTER — Telehealth: Payer: Self-pay

## 2023-09-23 ENCOUNTER — Ambulatory Visit (INDEPENDENT_AMBULATORY_CARE_PROVIDER_SITE_OTHER): Payer: Medicare Other | Admitting: Orthopedic Surgery

## 2023-09-23 DIAGNOSIS — M170 Bilateral primary osteoarthritis of knee: Secondary | ICD-10-CM

## 2023-09-23 NOTE — Telephone Encounter (Signed)
Auth needed for bil knee gel  

## 2023-09-24 ENCOUNTER — Encounter: Payer: Self-pay | Admitting: Orthopedic Surgery

## 2023-09-24 MED ORDER — BUPIVACAINE HCL 0.25 % IJ SOLN
4.0000 mL | INTRAMUSCULAR | Status: AC | PRN
Start: 2023-09-23 — End: 2023-09-23
  Administered 2023-09-23: 4 mL via INTRA_ARTICULAR

## 2023-09-24 MED ORDER — LIDOCAINE HCL 1 % IJ SOLN
5.0000 mL | INTRAMUSCULAR | Status: AC | PRN
  Administered 2023-09-23: 5 mL

## 2023-09-24 MED ORDER — METHYLPREDNISOLONE ACETATE 40 MG/ML IJ SUSP
40.0000 mg | INTRAMUSCULAR | Status: AC | PRN
  Administered 2023-09-23: 40 mg via INTRA_ARTICULAR

## 2023-09-24 MED ORDER — BUPIVACAINE HCL 0.25 % IJ SOLN
4.0000 mL | INTRAMUSCULAR | Status: AC | PRN
  Administered 2023-09-23: 4 mL via INTRA_ARTICULAR

## 2023-09-24 NOTE — Progress Notes (Signed)
Office Visit Note   Patient: Meagan Ryan           Date of Birth: Sep 29, 1956           MRN: [id number] Visit Date: 09/23/2023 Requested by: Soundra Pilon, FNP [phone] [address] [place],  [place] [address] PCP: Soundra Pilon, FNP  Subjective: Chief Complaint  Patient presents with   Left Knee - Pain   Right Knee - Pain    HPI: Meagan Ryan is a 67 y.o. female who presents to the office reporting bilateral knee pain.  For Layla the gel works better than the cortisone.  She reports some difficulty getting up from the seated position.  Right knee hurts worse than the left knee.  Patient did have cortisone injection bilaterally in May of this year as well as Monovisc injections in January of this year..                ROS: All systems reviewed are negative as they relate to the chief complaint within the history of present illness.  Patient denies fevers or chills.  Assessment & Plan: Visit Diagnoses:  1. Primary osteoarthritis of both knees     Plan: Impression is bilateral knee arthritis right worse than left.  No effusion in either knee today.  Overall her motion is reasonably well-preserved.  Strength is adequate.  Plan bilateral cortisone injections today and we will preapproved for for gel injections bilaterally for when these wear off.  She is not really interested in knee replacement at this time.  She does have history of congestive heart failure.  Follow-Up Instructions: No follow-ups on file.   Orders:  No orders of the defined types were placed in this encounter.  No orders of the defined types were placed in this encounter.     Procedures: Large Joint Inj: bilateral knee on 09/23/2023 8:34 AM Indications: diagnostic evaluation, joint swelling and pain Details: 18 G 1.5 in needle, superolateral approach  Arthrogram: No  Medications (Right): 5 mL lidocaine 1 %; 4 mL bupivacaine 0.25 %; 40 mg methylPREDNISolone acetate 40 MG/ML Medications (Left):  5 mL lidocaine 1 %; 4 mL bupivacaine 0.25 %; 40 mg methylPREDNISolone acetate 40 MG/ML Outcome: tolerated well, no immediate complications Procedure, treatment alternatives, risks and benefits explained, specific risks discussed. Consent was given by the patient. Immediately prior to procedure a time out was called to verify the correct patient, procedure, equipment, support staff and site/side marked as required. Patient was prepped and draped in the usual sterile fashion.       Clinical Data: No additional findings.  Objective: Vital Signs: There were no vitals taken for this visit.  Physical Exam:  Constitutional: Patient appears well-developed HEENT:  Head: Normocephalic Eyes:EOM are normal Neck: Normal range of motion Cardiovascular: Normal rate Pulmonary/chest: Effort normal Neurologic: Patient is alert Skin: Skin is warm Psychiatric: Patient has normal mood and affect  Ortho Exam: Ortho exam demonstrates no effusion in either knee.  Slight flexion contracture on the right less than 5 degree flexion contracture on the left.  Collateral crucial ligaments are stable.  Extensor mechanism intact.  No masses lymphadenopathy or skin changes noted in either knee region.  No groin pain with internal ex rotation of either leg. This patient is diagnosed with osteoarthritis of the knee(s).    Radiographs show evidence of joint space narrowing, osteophytes, subchondral sclerosis and/or subchondral cysts.  This patient has knee pain which interferes with functional and activities of  daily living.    This patient has experienced inadequate response, adverse effects and/or intolerance with conservative treatments such as acetaminophen, NSAIDS, topical creams, physical therapy or regular exercise, knee bracing and/or weight loss.   This patient has experienced inadequate response or has a contraindication to intra articular steroid injections for at least 3 months.   This patient is not  scheduled to have a total knee replacement within 6 months of starting treatment with viscosupplementation.   Specialty Comments:  No specialty comments available.  Imaging: No results found.   PMFS History: Patient Active Problem List   Diagnosis Date Noted   Dyspnea 01/14/2020   Symptomatic advanced heart block 04/15/2019   COVID-19 virus infection 04/15/2019   History of left breast cancer 05/01/2018   Genetic testing 03/13/2018   Family history of breast cancer    Malignant neoplasm of upper-outer quadrant of right breast in female, estrogen receptor positive (HCC) 02/23/2018   Syncope 12/14/2017   Pain and swelling of right lower leg 10/28/2016   Right leg numbness 10/28/2016   Chronic systolic CHF (congestive heart failure), NYHA class 2 (HCC) 02/26/2016   OSA (obstructive sleep apnea) 12/31/2015   History of breast cancer in female 10/30/2015   Ovarian cyst 10/10/2015   Bilateral ovarian cysts 10/09/2015   S/P oophorectomy 10/09/2015   Morbid obesity with BMI of 40.0-44.9, adult (HCC) 08/08/2015   Benign essential HTN 09/18/2014   RBBB 09/18/2014   Incomplete RBBB    DCM (dilated cardiomyopathy) (HCC) 04/24/2014   Meniere disease 06/02/2012   Past Medical History:  Diagnosis Date   Allergic rhinitis    Asthma    Breast cancer (HCC) 1996   Left breast-stage II infiltrating ductal carcinoma, 3/8 axillary lymph nodes were positive for metastatic carcinoma, getting yearly mammogram and every other year MRI followed by oncologist Dr Truett Perna   Complication of anesthesia    WAKE UP WITH HEADACHE, difficulty waking up   DCM (dilated cardiomyopathy) (HCC)    EF 55-60% by echo 04/2019   Dysuria 2011   Family history of adverse reaction to anesthesia    "family wakes up with personality changes, but are very short term"   Fatty liver CT 1/12   elevated LFT   Genetic testing 03/13/2018   Common cancers panel (47 genes) @ [facility] - No pathogenic mutations detected   H/O  fibromyalgia    H/O osteopenia 2009   H/O peripheral neuropathy    H/O varicose veins    Hyperlipidemia    Hypertension    Incomplete RBBB    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  Intolerant to PAP therapy   Pelvic pain in female    PMB (postmenopausal bleeding) 2011   Pregnancy induced hypertension    Right-sided lacunar infarction [facility])    MRI 1.2012   Tubular adenoma of colon     Family History  Problem Relation Age of Onset   Heart disease Mother        MI   Stroke Mother    Diabetes Mother    Hypertension Mother    Blindness Mother    Stroke Father    Prostate cancer Father 68       deceased 83   Colon cancer Sister 14       currently 75   Breast cancer Paternal Aunt 54  deceased 68s   Breast cancer Cousin        daughter of pat aunt w/ breast ca   Breast cancer Cousin        daughter of pat aunt w/ breast ca   Anxiety disorder Daughter    Depression Daughter     Past Surgical History:  Procedure Laterality Date   Gastroc muscle repair  2007   LAPAROSCOPIC CHOLECYSTECTOMY     MASTECTOMY  06/1995   Left - for infiltrating ductal carcinoma   MASTECTOMY W/ SENTINEL NODE BIOPSY Right 03/31/2018   MASTECTOMY W/ SENTINEL NODE BIOPSY Right 03/31/2018   Procedure: RIGHT TOTAL MASTECTOMY WITH AXILLARY SENTINEL LYMPH NODE BIOPSY ERAS PATHWAY;  Surgeon: Glenna Fellows, MD;  Location: [facility] OR;  Service: General;  Laterality: Right;   NEPHRECTOMY Right late 1990s   due to kidney shrinkage   PACEMAKER IMPLANT N/A 04/16/2019   Procedure: PACEMAKER IMPLANT;  Surgeon: Regan Lemming, MD;  Location: [facility] INVAS[facility];  Service: Cardiovascular;  Laterality: N/A;   ROBOTIC ASSISTED TOTAL HYSTERECTOMY WITH BILATERAL SALPINGO OOPHERECTOMY Bilateral 10/09/2015   Procedure: ROBOTIC ASSISTED BILATERAL SALPINGO OOPHORECTOMY ;  Surgeon: Adolphus Birchwood, MD;  Location: [facility] ORS;  Service:  Gynecology;  Laterality: Bilateral;   Tram surgery  1996   TUBAL LIGATION     Social History   Occupational History   Not on file  Tobacco Use   Smoking status: Never   Smokeless tobacco: Never  Vaping Use   Vaping status: Never Used  Substance and Sexual Activity   Alcohol use: No   Drug use: No   Sexual activity: Not on file

## 2023-09-26 NOTE — Telephone Encounter (Signed)
VOB submitted for Monovisc, bilateral knee  

## 2023-10-06 ENCOUNTER — Other Ambulatory Visit: Payer: Self-pay | Admitting: Cardiology

## 2023-10-10 NOTE — Telephone Encounter (Signed)
Prescription refill request for Eliquis received. Indication: PAF Last office visit: 04/05/23  Benjamine Sprague NP Scr: 1.09 on 04/05/23  Epic Age: 67 Weight: 100.2kg  Based on above findings Eliquis 5mg  twice daily is the appropriate dose.  Refill approved.

## 2023-10-11 ENCOUNTER — Ambulatory Visit (INDEPENDENT_AMBULATORY_CARE_PROVIDER_SITE_OTHER): Payer: Medicare Other

## 2023-10-11 ENCOUNTER — Other Ambulatory Visit: Payer: Self-pay | Admitting: Cardiology

## 2023-10-11 DIAGNOSIS — I420 Dilated cardiomyopathy: Secondary | ICD-10-CM

## 2023-10-11 LAB — CUP PACEART REMOTE DEVICE CHECK
Battery Remaining Longevity: 81 mo
Battery Voltage: 2.97 V
Brady Statistic AP VP Percent: 27.1 %
Brady Statistic AP VS Percent: 0 %
Brady Statistic AS VP Percent: 72.87 %
Brady Statistic AS VS Percent: 0.02 %
Brady Statistic RA Percent Paced: 27.05 %
Brady Statistic RV Percent Paced: 99.98 %
Date Time Interrogation Session: 20241203013811
Implantable Lead Connection Status: 753985
Implantable Lead Connection Status: 753985
Implantable Lead Implant Date: 20200608
Implantable Lead Implant Date: 20200608
Implantable Lead Location: 753859
Implantable Lead Location: 753860
Implantable Lead Model: 5076
Implantable Lead Model: 5076
Implantable Pulse Generator Implant Date: 20200608
Lead Channel Impedance Value: 361 Ohm
Lead Channel Impedance Value: 380 Ohm
Lead Channel Impedance Value: 380 Ohm
Lead Channel Impedance Value: 437 Ohm
Lead Channel Pacing Threshold Amplitude: 0.75 V
Lead Channel Pacing Threshold Amplitude: 0.75 V
Lead Channel Pacing Threshold Pulse Width: 0.4 ms
Lead Channel Pacing Threshold Pulse Width: 0.4 ms
Lead Channel Sensing Intrinsic Amplitude: 15.375 mV
Lead Channel Sensing Intrinsic Amplitude: 15.375 mV
Lead Channel Sensing Intrinsic Amplitude: 2.5 mV
Lead Channel Sensing Intrinsic Amplitude: 2.5 mV
Lead Channel Setting Pacing Amplitude: 1.5 V
Lead Channel Setting Pacing Amplitude: 2.5 V
Lead Channel Setting Pacing Pulse Width: 0.4 ms
Lead Channel Setting Sensing Sensitivity: 2.8 mV
Zone Setting Status: 755011
Zone Setting Status: 755011

## 2024-01-10 ENCOUNTER — Ambulatory Visit (INDEPENDENT_AMBULATORY_CARE_PROVIDER_SITE_OTHER): Payer: Medicare Other

## 2024-01-10 DIAGNOSIS — I420 Dilated cardiomyopathy: Secondary | ICD-10-CM | POA: Diagnosis not present

## 2024-01-10 DIAGNOSIS — I480 Paroxysmal atrial fibrillation: Secondary | ICD-10-CM

## 2024-01-12 LAB — CUP PACEART REMOTE DEVICE CHECK
Battery Remaining Longevity: 75 mo
Battery Voltage: 2.97 V
Brady Statistic AP VP Percent: 57.42 %
Brady Statistic AP VS Percent: 0 %
Brady Statistic AS VP Percent: 42.49 %
Brady Statistic AS VS Percent: 0.09 %
Brady Statistic RA Percent Paced: 57.42 %
Brady Statistic RV Percent Paced: 99.91 %
Date Time Interrogation Session: 20250304020810
Implantable Lead Connection Status: 753985
Implantable Lead Connection Status: 753985
Implantable Lead Implant Date: 20200608
Implantable Lead Implant Date: 20200608
Implantable Lead Location: 753859
Implantable Lead Location: 753860
Implantable Lead Model: 5076
Implantable Lead Model: 5076
Implantable Pulse Generator Implant Date: 20200608
Lead Channel Impedance Value: 361 Ohm
Lead Channel Impedance Value: 380 Ohm
Lead Channel Impedance Value: 399 Ohm
Lead Channel Impedance Value: 437 Ohm
Lead Channel Pacing Threshold Amplitude: 0.75 V
Lead Channel Pacing Threshold Amplitude: 0.75 V
Lead Channel Pacing Threshold Pulse Width: 0.4 ms
Lead Channel Pacing Threshold Pulse Width: 0.4 ms
Lead Channel Sensing Intrinsic Amplitude: 15.375 mV
Lead Channel Sensing Intrinsic Amplitude: 15.375 mV
Lead Channel Sensing Intrinsic Amplitude: 2.25 mV
Lead Channel Sensing Intrinsic Amplitude: 2.25 mV
Lead Channel Setting Pacing Amplitude: 1.5 V
Lead Channel Setting Pacing Amplitude: 2.5 V
Lead Channel Setting Pacing Pulse Width: 0.4 ms
Lead Channel Setting Sensing Sensitivity: 2.8 mV
Zone Setting Status: 755011
Zone Setting Status: 755011

## 2024-02-14 NOTE — Progress Notes (Signed)
 Remote pacemaker transmission.

## 2024-02-14 NOTE — Addendum Note (Signed)
 Addended by: Geralyn Flash D on: 02/14/2024 12:53 PM   Modules accepted: Orders

## 2024-02-27 ENCOUNTER — Other Ambulatory Visit: Payer: Self-pay | Admitting: Cardiology

## 2024-03-21 ENCOUNTER — Other Ambulatory Visit: Payer: Self-pay | Admitting: Cardiology

## 2024-04-03 ENCOUNTER — Other Ambulatory Visit: Payer: Self-pay | Admitting: Cardiology

## 2024-04-03 NOTE — Telephone Encounter (Signed)
 Prescription refill request for Eliquis  received. Indication:afib Last office visit:5/24 Scr:1.09  5/24 Age: 68 Weight:100.6  kg  Prescription refilled

## 2024-04-09 ENCOUNTER — Other Ambulatory Visit: Payer: Self-pay | Admitting: Cardiology

## 2024-04-10 ENCOUNTER — Ambulatory Visit (INDEPENDENT_AMBULATORY_CARE_PROVIDER_SITE_OTHER): Payer: Medicare Other

## 2024-04-10 DIAGNOSIS — I420 Dilated cardiomyopathy: Secondary | ICD-10-CM

## 2024-04-10 LAB — CUP PACEART REMOTE DEVICE CHECK
Battery Remaining Longevity: 69 mo
Battery Voltage: 2.96 V
Brady Statistic AP VP Percent: 56.51 %
Brady Statistic AP VS Percent: 0 %
Brady Statistic AS VP Percent: 43.38 %
Brady Statistic AS VS Percent: 0.11 %
Brady Statistic RA Percent Paced: 56.51 %
Brady Statistic RV Percent Paced: 99.89 %
Date Time Interrogation Session: 20250602210038
Implantable Lead Connection Status: 753985
Implantable Lead Connection Status: 753985
Implantable Lead Implant Date: 20200608
Implantable Lead Implant Date: 20200608
Implantable Lead Location: 753859
Implantable Lead Location: 753860
Implantable Lead Model: 5076
Implantable Lead Model: 5076
Implantable Pulse Generator Implant Date: 20200608
Lead Channel Impedance Value: 361 Ohm
Lead Channel Impedance Value: 380 Ohm
Lead Channel Impedance Value: 380 Ohm
Lead Channel Impedance Value: 437 Ohm
Lead Channel Pacing Threshold Amplitude: 0.75 V
Lead Channel Pacing Threshold Amplitude: 0.875 V
Lead Channel Pacing Threshold Pulse Width: 0.4 ms
Lead Channel Pacing Threshold Pulse Width: 0.4 ms
Lead Channel Sensing Intrinsic Amplitude: 15.375 mV
Lead Channel Sensing Intrinsic Amplitude: 15.375 mV
Lead Channel Sensing Intrinsic Amplitude: 2.125 mV
Lead Channel Sensing Intrinsic Amplitude: 2.125 mV
Lead Channel Setting Pacing Amplitude: 1.5 V
Lead Channel Setting Pacing Amplitude: 2.5 V
Lead Channel Setting Pacing Pulse Width: 0.4 ms
Lead Channel Setting Sensing Sensitivity: 2.8 mV
Zone Setting Status: 755011
Zone Setting Status: 755011

## 2024-04-16 ENCOUNTER — Other Ambulatory Visit: Payer: Self-pay | Admitting: Cardiology

## 2024-04-20 ENCOUNTER — Ambulatory Visit: Payer: Self-pay | Admitting: Cardiology

## 2024-04-25 ENCOUNTER — Ambulatory Visit: Admitting: Cardiology

## 2024-05-01 ENCOUNTER — Encounter: Payer: Self-pay | Admitting: Cardiology

## 2024-05-01 ENCOUNTER — Ambulatory Visit: Attending: Cardiology | Admitting: Cardiology

## 2024-05-01 VITALS — BP 124/70 | HR 60 | Ht 64.0 in | Wt 239.0 lb

## 2024-05-01 DIAGNOSIS — R0609 Other forms of dyspnea: Secondary | ICD-10-CM | POA: Diagnosis present

## 2024-05-01 DIAGNOSIS — R0789 Other chest pain: Secondary | ICD-10-CM | POA: Insufficient documentation

## 2024-05-01 DIAGNOSIS — I5032 Chronic diastolic (congestive) heart failure: Secondary | ICD-10-CM | POA: Diagnosis not present

## 2024-05-01 DIAGNOSIS — I420 Dilated cardiomyopathy: Secondary | ICD-10-CM | POA: Diagnosis present

## 2024-05-01 DIAGNOSIS — I10 Essential (primary) hypertension: Secondary | ICD-10-CM | POA: Insufficient documentation

## 2024-05-01 DIAGNOSIS — I480 Paroxysmal atrial fibrillation: Secondary | ICD-10-CM | POA: Diagnosis present

## 2024-05-01 DIAGNOSIS — R5383 Other fatigue: Secondary | ICD-10-CM | POA: Insufficient documentation

## 2024-05-01 DIAGNOSIS — I441 Atrioventricular block, second degree: Secondary | ICD-10-CM | POA: Diagnosis present

## 2024-05-01 MED ORDER — CARVEDILOL 12.5 MG PO TABS: 12.5000 mg | ORAL_TABLET | Freq: Two times a day (BID) | ORAL | 3 refills | Status: AC

## 2024-05-01 NOTE — Addendum Note (Signed)
 Addended by: Salimata Christenson N on: 05/01/2024 02:43 PM   Modules accepted: Orders

## 2024-05-01 NOTE — Patient Instructions (Signed)
 Medication Instructions:  Refilled carvedilol  *If you need a refill on your cardiac medications before your next appointment, please call your pharmacy*  Lab Work: BMET, CBC today If you have labs (blood work) drawn today and your tests are completely normal, you will receive your results only by: MyChart Message (if you have MyChart) OR A paper copy in the mail If you have any lab test that is abnormal or we need to change your treatment, we will call you to review the results.  Testing/Procedures: Coronary Calcium Score Your physician has requested that you have a coronary calcium score performed. This is not covered by insurance and will be an out-of-pocket cost of approximately $99.   Follow-Up: At [facility], you and your health needs are our priority.  As part of our continuing mission to provide you with exceptional heart care, our providers are all part of one team.  This team includes your primary Cardiologist (physician) and Advanced Practice Providers or APPs (Physician Assistants and Nurse Practitioners) who all work together to provide you with the care you need, when you need it.  Your next appointment:   1 year  Provider:   Shlomo, MD  We recommend signing up for the patient portal called MyChart.  Sign up information is provided on this After Visit Summary.  MyChart is used to connect with patients for Virtual Visits (Telemedicine).  Patients are able to view lab/test results, encounter notes, upcoming appointments, etc.  Non-urgent messages can be sent to your provider as well.   To learn more about what you can do with MyChart, go to [email or web address].

## 2024-05-01 NOTE — Progress Notes (Signed)
 Date:  05/01/2024   ID:  Meagan Ryan, DOB Aug 06, 1956, MRN [id number]   PCP:  Marvene Prentice SAUNDERS, FNP  Cardiologist:  Wilbert Bihari, MD Sleep Medicine:  Wilbert Bihari MD Electrophysiologist:  Soyla Gladis Norton, MD   Chief Complaint:  HTN  History of Present Illness:    Meagan Ryan is a 68 y.o. 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  She has h/o breast cancer with recurrence.  Her cardiomyopathy was diagnosed in October 2016.  Echo at that time showed reduced EF down to 30 to 35%.  Subsequently, she underwent further ischemic evaluation with a coronary CTA with morphology done on September 04, 2015.  This showed a coronary calcium score of 0.  Normal coronary origin with right dominance.  There was no evidence of CAD, making the diagnosis of nonischemic cardiomyopathy.  She was placed on medical therapy. She has been on a BB and Entresto  along with Corlanor . She had a repeat echocardiogram December 14, 2017 which showed improvement in EF to 50 to 55%.  Grade 2 diastolic dysfunction was also noted.  Her valvular function was normal.     She was admitted 04/2019 with COVID 19 complicated by symptomatic bradycardia with second degree Type 2 HB and intermittent CHB.  Carvedilol  was stopped.  She underwent PPM by Dr. Norton.  She was seen back 11/202 and was doing well except for fatigue and lack of energy since COVID.  It was felt sx were due to deconditioning and residual effects from COVID.  Her Carvedilol  was restarted at 6.25mg  BID due to HTN.    I saw her 12/2019 and she was still complaining of fatigue.  Dr. Norton adjusted her PPM settings but this did not help.  She thought the ongoing fatigue was due to COVID.  She also had a lingering cough at night and DOE since her COVID infection even with doing housework.  A 2D echo was normal and she was referred to Dr. Shelah with Pulmonary who felt her sx were post-COVID related.  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  At last office visit she was continuing to complain of weakness and also some chest pain.  Coronary CTA was ordered but she never followed through.  She is now back for follow-up.  She is here today for followup and is doing well.  She denies any chest pain or pressure, PND, orthopnea, LE edema, dizziness (except for her Menier's dz), palpitations or syncope. She still has chronic SOB that she thinks is about the same.  She is compliant with her meds and is tolerating meds with no SE.    Prior CV studies:   The following studies were reviewed today: none  Past Medical History:  Diagnosis Date   Allergic rhinitis    Asthma    Breast cancer (HCC) 1996   Left breast-stage II infiltrating ductal carcinoma, 3/8 axillary lymph nodes were positive for metastatic carcinoma, getting yearly mammogram and every other year MRI followed by oncologist Dr Cloretta   Complication of anesthesia    WAKE UP WITH HEADACHE, difficulty waking up   DCM (dilated cardiomyopathy) (HCC)    EF 55-60% by echo 04/2019   Dysuria 2011   Family history of adverse reaction to anesthesia    family wakes up with personality changes, but are very short term   Fatty liver CT 1/12  elevated LFT   Genetic testing 03/13/2018   Common cancers panel (47 genes) @ [facility] - No pathogenic mutations detected   H/O fibromyalgia    H/O osteopenia 2009   H/O peripheral neuropathy    H/O varicose veins    Hyperlipidemia    Hypertension    Incomplete RBBB    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  Intolerant to PAP therapy   Pelvic pain in female    PMB (postmenopausal bleeding) 2011   Pregnancy induced hypertension    Right-sided lacunar infarction [facility])    MRI 1.2012   Tubular adenoma of colon    Past Surgical  History:  Procedure Laterality Date   Gastroc muscle repair  2007   LAPAROSCOPIC CHOLECYSTECTOMY     MASTECTOMY  06/1995   Left - for infiltrating ductal carcinoma   MASTECTOMY W/ SENTINEL NODE BIOPSY Right 03/31/2018   MASTECTOMY W/ SENTINEL NODE BIOPSY Right 03/31/2018   Procedure: RIGHT TOTAL MASTECTOMY WITH AXILLARY SENTINEL LYMPH NODE BIOPSY ERAS PATHWAY;  Surgeon: Mikell Katz, MD;  Location: [facility] OR;  Service: General;  Laterality: Right;   NEPHRECTOMY Right late 1990s   due to kidney shrinkage   PACEMAKER IMPLANT N/A 04/16/2019   Procedure: PACEMAKER IMPLANT;  Surgeon: Inocencio Soyla Lunger, MD;  Location: [facility] INV[facility];  Service: Cardiovascular;  Laterality: N/A;   ROBOTIC ASSISTED TOTAL HYSTERECTOMY WITH BILATERAL SALPINGO OOPHERECTOMY Bilateral 10/09/2015   Procedure: ROBOTIC ASSISTED BILATERAL SALPINGO OOPHORECTOMY ;  Surgeon: Maurilio Ship, MD;  Location: [facility] ORS;  Service: Gynecology;  Laterality: Bilateral;   Tram surgery  1996   TUBAL LIGATION       Current Meds  Medication Sig   ALPRAZolam  (XANAX ) 0.5 MG tablet Take 1 tablet (0.5 mg total) by mouth 2 (two) times daily as needed for anxiety.   anastrozole  (ARIMIDEX ) 1 MG tablet TAKE 1 TABLET DAILY   carvedilol  (COREG ) 12.5 MG tablet TAKE 1 TABLET TWICE A DAY WITH MEALS (KEEP YOUR UPCOMING APPOINTMENT JUNE 2025 WITH YOUR CARDIOLOGIST FOR FUTURE REFILLS)   cetirizine  (ZYRTEC ) 10 MG tablet Take 10 mg by mouth every morning.    Cyanocobalamin (VITAMIN B 12 PO) Take by mouth.   Dextromethorphan-buPROPion ER (AUVELITY ) 45-105 MG TBCR Take 1 tablet by mouth 2 (two) times daily.   DULoxetine  (CYMBALTA ) 60 MG capsule Take 60 mg by mouth daily.   ELIQUIS  5 MG TABS tablet TAKE 1 TABLET TWICE A DAY   ezetimibe  (ZETIA ) 10 MG tablet TAKE 1 TABLET DAILY   fluticasone  (FLONASE ) 50 MCG/ACT nasal spray Place 1 spray into both nostrils every morning.    gabapentin  (NEURONTIN ) 300 MG capsule Take 300 mg by mouth 2 (two) times daily.    hydrochlorothiazide (HYDRODIURIL) 25 MG tablet Take by mouth.   ivabradine  (CORLANOR ) 5 MG TABS tablet TAKE 1 TABLET TWICE A DAY   niacin  500 MG tablet Take 500 mg by mouth daily.   omeprazole  (PRILOSEC) 20 MG capsule Take 20 mg by mouth daily.   potassium chloride  (MICRO-K ) 10 MEQ CR capsule Take 10 mEq by mouth daily.   predniSONE  (DELTASONE ) 10 MG tablet Take 1 tablet (10 mg total) by mouth daily with breakfast.   sacubitril -valsartan  (ENTRESTO ) 24-26 MG TAKE 1 TABLET TWICE A DAY   VITAMIN D  PO Take 4,000 Units by mouth in the morning and at bedtime.   ZEPBOUND 5 MG/0.5ML Pen  Inject 5 mg into the skin once a week.     Allergies:   Amlodipine, Noroxin [norfloxacin], Cephalexin, Chocolate, Codeine, Sulfa antibiotics, Latex, and Penicillins   Social History   Tobacco Use   Smoking status: Never   Smokeless tobacco: Never  Vaping Use   Vaping status: Never Used  Substance Use Topics   Alcohol use: No   Drug use: No     Family Hx: The patient's family history includes Anxiety disorder in her daughter; Blindness in her mother; Breast cancer in her cousin and cousin; Breast cancer (age of onset: 18) in her paternal aunt; Colon cancer (age of onset: 51) in her sister; Depression in her daughter; Diabetes in her mother; Heart disease in her mother; Hypertension in her mother; Prostate cancer (age of onset: 84) in her father; Stroke in her father and mother.  ROS:   Please see the history of present illness.     All other systems reviewed and are negative.   Labs/Other Tests and Data Reviewed:    Recent Labs: No results found for requested labs within last 365 days.   Recent Lipid Panel Lab Results  Component Value Date/Time   CHOL 196 04/05/2023 11:42 AM   TRIG 158 (H) 04/05/2023 11:42 AM   HDL 43 04/05/2023 11:42 AM   CHOLHDL 4.6 (H) 04/05/2023 11:42 AM   LDLCALC 125 (H) 04/05/2023 11:42 AM    Wt Readings from Last 3 Encounters:  05/01/24 239 lb (108.4 kg)  04/05/23 221  lb (100.2 kg)  09/02/22 239 lb 9.6 oz (108.7 kg)     Objective:    Vital Signs:  BP 124/70   Pulse 60   Ht 5' 4 (1.626 m)   Wt 239 lb (108.4 kg)   SpO2 98%   BMI 41.02 kg/m   GEN: Well nourished, well developed in no acute distress HEENT: Normal NECK: No JVD; No carotid bruits LYMPHATICS: No lymphadenopathy CARDIAC:RRR, no  rubs, gallops. 2/6 SM at RUSB RESPIRATORY:  Clear to auscultation without rales, wheezing or rhonchi  ABDOMEN: Soft, non-tender, non-distended MUSCULOSKELETAL:  No edema; No deformity  SKIN: Warm and dry NEUROLOGIC:  Alert and oriented x 3 PSYCHIATRIC:  Normal affect   EKG Interpretation Date/Time:  Tuesday May 01 2024 13:55:29 EDT Ventricular Rate:  60 PR Interval:    QRS Duration:  150 QT Interval:  492 QTC Calculation: 492 R Axis:   -63  Text Interpretation: AV dual-paced rhythm When compared with ECG of 20-Feb-2020 14:39, No significant change was found Confirmed by Shlomo Corning ([id number]) on 05/01/2024 2:21:59 PM   ASSESSMENT & PLAN:     #DCM #Chronic diastolic CHF -felt secondary to chemo -initial EF 30-35% but normalized to 55-60% on echo 04/2019 -repeat 2D echo 12/2019 showed normal LVF with EF 55-60% and G1DD  -2D echo 04/25/2023 EF 60 to 65% with G1 DD -She has complained of chronic fatigue for years which I suspect is related to post COVID, obesity and sedentary state>>fatigue is unchanged -Continue carvedilol  12.5 mg twice daily, Corlanor  5 mg twice daily, HCTZ 25 mg daily, Entresto  24-26 mg twice daily  #HTN - BP controlled - Continue carvedilol  12.5 mg twice daily and Entresto  24-26 mg twice daily along with HCTZ 25 mg daily   #DOE/Fatigue -this started after her COVID infection in June 2021 -HR Chest CT with post COVID changes -pulmonary felt due to COVID and obesity -no OSA on HST -Repeat 2D echo 2024 showed stable EF 60 to 65% with G1 DD -  Still complains of chronic fatigue and dyspnea on exertion which does not appear to be  cardiac.  I did order a coronary CTA last time I saw her which she did not proceed to go through with>>she no longer has CP and her SOB is very stable.    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 Inocencio  #PAF - She mains AV paced on EKG -I have personally reviewed and interpreted outside labs performed by patient's PCP which showed Hbg 13.2 in June 2022  and SCr 0.92  -Carvedilol  12.5 mg twice daily and Eliquis  5 mg twice daily with as needed refills -Denies any palpitations or bleeding problems on DOAC -followed in afib clinic -Check CBC and Bmet  #OSA -was mild in the past and intolerant to treatment>>repeat study by pulmonary showed no OSA  #Chronic Fatigue -this started after infection with COVID 19 and has not improved -2D echo showed normal LVF -CBC and TSH normal -per PCP  #Chest pain -she had a normal coronary CTA in 2016 -she had CP several years ago and I ordered a coronary CTA but she cancelled it -she has not had any further CP -check coronary Ca score  #Heart murmur -AVSC on echo 04/2023     Medication Adjustments/Labs and Tests Ordered: Current medicines are reviewed at length with the patient today.  Concerns regarding medicines are outlined above.  Tests Ordered: Orders Placed This Encounter  Procedures   EKG 12-Lead    Medication Changes: No orders of the defined types were placed in this encounter.    Disposition:  Follow up 1 year  Signed, Wilbert Bihari, MD  05/01/2024 2:29 PM    [facility]

## 2024-05-02 ENCOUNTER — Ambulatory Visit: Payer: Self-pay | Admitting: Cardiology

## 2024-05-02 DIAGNOSIS — Z79899 Other long term (current) drug therapy: Secondary | ICD-10-CM

## 2024-05-02 DIAGNOSIS — R931 Abnormal findings on diagnostic imaging of heart and coronary circulation: Secondary | ICD-10-CM

## 2024-05-02 LAB — BASIC METABOLIC PANEL WITH GFR
BUN/Creatinine Ratio: 11 — ABNORMAL LOW (ref 12–28)
BUN: 11 mg/dL (ref 8–27)
CO2: 24 mmol/L (ref 20–29)
Calcium: 9.8 mg/dL (ref 8.7–10.3)
Chloride: 98 mmol/L (ref 96–106)
Creatinine, Ser: 0.99 mg/dL (ref 0.57–1.00)
Glucose: 88 mg/dL (ref 70–99)
Potassium: 5.2 mmol/L (ref 3.5–5.2)
Sodium: 135 mmol/L (ref 134–144)
eGFR: 62 mL/min/{1.73_m2} (ref >59)

## 2024-05-02 LAB — CBC
Hematocrit: 41.9 % (ref 34.0–46.6)
Hemoglobin: 12.7 g/dL (ref 11.1–15.9)
MCH: 25.9 pg — ABNORMAL LOW (ref 26.6–33.0)
MCHC: 30.3 g/dL — ABNORMAL LOW (ref 31.5–35.7)
MCV: 86 fL (ref 79–97)
Platelets: 340 10*3/uL (ref 150–450)
RBC: 4.9 x10E6/uL (ref 3.77–5.28)
RDW: 15.6 % — ABNORMAL HIGH (ref 11.7–15.4)
WBC: 5.9 10*3/uL (ref 3.4–10.8)

## 2024-05-15 ENCOUNTER — Other Ambulatory Visit: Payer: Self-pay | Admitting: Nurse Practitioner

## 2024-06-04 ENCOUNTER — Ambulatory Visit (HOSPITAL_COMMUNITY)
Admission: RE | Admit: 2024-06-04 | Discharge: 2024-06-04 | Disposition: A | Discharge status: Home / Self Care | Payer: Self-pay | Source: Ambulatory Visit | Attending: Cardiology | Admitting: Cardiology

## 2024-06-04 DIAGNOSIS — R0789 Other chest pain: Secondary | ICD-10-CM | POA: Insufficient documentation

## 2024-06-05 ENCOUNTER — Encounter: Payer: Self-pay | Admitting: Cardiology

## 2024-06-05 DIAGNOSIS — R931 Abnormal findings on diagnostic imaging of heart and coronary circulation: Secondary | ICD-10-CM | POA: Insufficient documentation

## 2024-06-05 NOTE — Progress Notes (Signed)
 Remote pacemaker transmission.

## 2024-06-06 NOTE — Telephone Encounter (Signed)
 Call to patient to discuss Coronary calcium score results , which were mildly elevated at 19 which is 55th percentile for age, race and sex matched controls. Patient agrees to come in for a fasting lipid panel and ALT. Patient verbalizes understanding of no ASA due to Eliquis .

## 2024-06-06 NOTE — Telephone Encounter (Signed)
-----   Message from Meagan Ryan sent at 06/05/2024  2:09 PM EDT ----- Noncardiac portion of coronary calcium score was normal ----- Message ----- From: Interface, Rad Results In Sent: 06/04/2024  10:31 PM EDT To: Meagan JONELLE Bihari, MD

## 2024-06-14 ENCOUNTER — Other Ambulatory Visit: Payer: Self-pay | Admitting: Cardiology

## 2024-07-10 ENCOUNTER — Ambulatory Visit (INDEPENDENT_AMBULATORY_CARE_PROVIDER_SITE_OTHER): Payer: Medicare Other

## 2024-07-10 DIAGNOSIS — I5032 Chronic diastolic (congestive) heart failure: Secondary | ICD-10-CM | POA: Diagnosis not present

## 2024-07-12 ENCOUNTER — Ambulatory Visit: Payer: Self-pay | Admitting: Cardiology

## 2024-07-12 LAB — CUP PACEART REMOTE DEVICE CHECK
Battery Remaining Longevity: 62 mo
Battery Voltage: 2.96 V
Brady Statistic AP VP Percent: 76 %
Brady Statistic AP VS Percent: 0 %
Brady Statistic AS VP Percent: 23.95 %
Brady Statistic AS VS Percent: 0.05 %
Brady Statistic RA Percent Paced: 75.68 %
Brady Statistic RV Percent Paced: 99.94 %
Date Time Interrogation Session: 20250902055644
Implantable Lead Connection Status: 753985
Implantable Lead Connection Status: 753985
Implantable Lead Implant Date: 20200608
Implantable Lead Implant Date: 20200608
Implantable Lead Location: 753859
Implantable Lead Location: 753860
Implantable Lead Model: 5076
Implantable Lead Model: 5076
Implantable Pulse Generator Implant Date: 20200608
Lead Channel Impedance Value: 361 Ohm
Lead Channel Impedance Value: 380 Ohm
Lead Channel Impedance Value: 399 Ohm
Lead Channel Impedance Value: 437 Ohm
Lead Channel Pacing Threshold Amplitude: 0.75 V
Lead Channel Pacing Threshold Amplitude: 0.875 V
Lead Channel Pacing Threshold Pulse Width: 0.4 ms
Lead Channel Pacing Threshold Pulse Width: 0.4 ms
Lead Channel Sensing Intrinsic Amplitude: 1.75 mV
Lead Channel Sensing Intrinsic Amplitude: 1.75 mV
Lead Channel Sensing Intrinsic Amplitude: 15.375 mV
Lead Channel Sensing Intrinsic Amplitude: 15.375 mV
Lead Channel Setting Pacing Amplitude: 1.5 V
Lead Channel Setting Pacing Amplitude: 2.5 V
Lead Channel Setting Pacing Pulse Width: 0.4 ms
Lead Channel Setting Sensing Sensitivity: 2.8 mV
Zone Setting Status: 755011
Zone Setting Status: 755011

## 2024-07-17 NOTE — Progress Notes (Signed)
 Remote PPM Transmission

## 2024-08-06 ENCOUNTER — Other Ambulatory Visit: Payer: Self-pay | Admitting: Nurse Practitioner

## 2024-09-10 ENCOUNTER — Encounter: Payer: Self-pay | Admitting: Radiology

## 2024-10-09 ENCOUNTER — Ambulatory Visit: Payer: Medicare Other

## 2024-10-09 DIAGNOSIS — I5032 Chronic diastolic (congestive) heart failure: Secondary | ICD-10-CM | POA: Diagnosis not present

## 2024-10-10 LAB — CUP PACEART REMOTE DEVICE CHECK
Battery Remaining Longevity: 56 mo
Battery Voltage: 2.96 V
Brady Statistic AP VP Percent: 68.41 %
Brady Statistic AP VS Percent: 0 %
Brady Statistic AS VP Percent: 31.41 %
Brady Statistic AS VS Percent: 0.18 %
Brady Statistic RA Percent Paced: 68.44 %
Brady Statistic RV Percent Paced: 99.82 %
Date Time Interrogation Session: 20251201215210
Implantable Lead Connection Status: 753985
Implantable Lead Connection Status: 753985
Implantable Lead Implant Date: 20200608
Implantable Lead Implant Date: 20200608
Implantable Lead Location: 753859
Implantable Lead Location: 753860
Implantable Lead Model: 5076
Implantable Lead Model: 5076
Implantable Pulse Generator Implant Date: 20200608
Lead Channel Impedance Value: 342 Ohm
Lead Channel Impedance Value: 361 Ohm
Lead Channel Impedance Value: 380 Ohm
Lead Channel Impedance Value: 418 Ohm
Lead Channel Pacing Threshold Amplitude: 0.625 V
Lead Channel Pacing Threshold Amplitude: 0.75 V
Lead Channel Pacing Threshold Pulse Width: 0.4 ms
Lead Channel Pacing Threshold Pulse Width: 0.4 ms
Lead Channel Sensing Intrinsic Amplitude: 15.375 mV
Lead Channel Sensing Intrinsic Amplitude: 15.375 mV
Lead Channel Sensing Intrinsic Amplitude: 2.375 mV
Lead Channel Sensing Intrinsic Amplitude: 2.375 mV
Lead Channel Setting Pacing Amplitude: 1.5 V
Lead Channel Setting Pacing Amplitude: 2.5 V
Lead Channel Setting Pacing Pulse Width: 0.4 ms
Lead Channel Setting Sensing Sensitivity: 2.8 mV
Zone Setting Status: 755011
Zone Setting Status: 755011

## 2024-10-11 ENCOUNTER — Ambulatory Visit: Payer: Self-pay | Admitting: Cardiology

## 2024-10-16 NOTE — Progress Notes (Signed)
 Remote PPM Transmission

## 2024-12-11 ENCOUNTER — Ambulatory Visit: Admitting: Orthopedic Surgery

## 2024-12-11 DIAGNOSIS — S61451A Open bite of right hand, initial encounter: Secondary | ICD-10-CM

## 2024-12-11 DIAGNOSIS — W540XXA Bitten by dog, initial encounter: Secondary | ICD-10-CM

## 2024-12-12 ENCOUNTER — Encounter: Payer: Self-pay | Admitting: Orthopedic Surgery

## 2024-12-12 NOTE — Progress Notes (Signed)
 "  Office Visit Note   Patient: Meagan Ryan           Date of Birth: 14-Nov-1955           MRN: [id number] Visit Date: 12/11/2024              Requested by: Marvene Prentice SAUNDERS, FNP [phone] [place],  [place] [address] PCP: Marvene Prentice SAUNDERS, FNP  Chief Complaint  Patient presents with   Right Hand - Wound Check      HPI: Discussed the use of AI scribe software for clinical note transcription with the patient, who gave verbal consent to proceed.  History of Present Illness Meagan Ryan is a 69 year old female who presents for follow-up of a right hand thenar eminence wound status post tissue grafting.  She is undergoing home wound care for a wound on the dorsum of the right hand thenar eminence following tissue grafting. Current management includes dressing changes with Vashe and adaptic. She reports that the posterior aspect of the wound has closed, with a remaining circular area.  She denies pain, drainage, or signs of infection. She receives assistance from an experienced nurse and has access to advanced wound care supplies, including fish skin.     Assessment & Plan: Visit Diagnoses: No diagnosis found.  Plan: Assessment and Plan Assessment & Plan Right hand thenar eminence wound, status post tissue grafting Wound healing well post tissue grafting with healthy granulation tissue, no exposed tendons, and no cellulitis. - Applied donated keratosis tissue graft with silicone border. - Plan to apply 3 x 3 cm fenestrated keratosis sheet with silicone border. - Instructed daily dressing changes with adaptic and vashe.      Follow-Up Instructions: No follow-ups on file.   Ortho Exam  Patient is alert, oriented, no adenopathy, well-dressed, normal affect, normal respiratory effort. Physical Exam EXTREMITIES: Wound on dorsum of right thenar eminence with flat healthy granulation tissue post tissue grafting. Tendons not exposed. No cellulitis. Healthy  granulation tissue around wound edges. Wound measures 3 x 2.5 cm.      Imaging: No results found. No images are attached to the encounter.  Labs: Lab Results  Component Value Date   CRP 4.7 (H) 04/15/2019   LABURIC 5.5 05/08/2018   REPTSTATUS 04/20/2019 FINAL 04/15/2019   CULT  04/15/2019    NO GROWTH 5 DAYS Performed at [facility], [address], [place], [place] [address]      Lab Results  Component Value Date   ALBUMIN  3.6 (L) 04/05/2023   ALBUMIN  3.4 (L) 04/10/2021   ALBUMIN  3.4 (L) 06/14/2019    No results found for: MG Lab Results  Component Value Date   VD25OH 57.7 04/05/2023    No results found for: PREALBUMIN    Latest Ref Rng & Units 05/01/2024    3:04 PM 04/05/2023   11:42 AM 04/10/2021   11:24 AM  CBC EXTENDED  WBC 3.4 - 10.8 x10E3/uL 5.9  6.2  10.3   RBC 3.77 - 5.28 x10E6/uL 4.90  4.91  4.86   Hemoglobin 11.1 - 15.9 g/dL 87.2  86.1  86.7   HCT 34.0 - 46.6 % 41.9  43.8  41.4   Platelets 150 - 450 x10E3/uL 340  270  283   NEUT# 1.7 - 7.7 K/uL   7.8   Lymph# 0.7 - 4.0 K/uL   1.7      There is no height or weight on file to  calculate BMI.  Orders:  No orders of the defined types were placed in this encounter.  No orders of the defined types were placed in this encounter.    Procedures: No procedures performed  Clinical Data: No additional findings.  ROS:  All other systems negative, except as noted in the HPI. Review of Systems  Objective: Vital Signs: There were no vitals taken for this visit.  Specialty Comments:  No specialty comments available.  PMFS History: Patient Active Problem List   Diagnosis Date Noted   Agatston coronary artery calcium score less than 100    Dyspnea 01/14/2020   Symptomatic advanced heart block 04/15/2019   COVID-19 virus infection 04/15/2019   History of left breast cancer 05/01/2018   Genetic testing 03/13/2018   Family history of breast cancer    Malignant neoplasm of upper-outer  quadrant of right breast in female, estrogen receptor positive (HCC) 02/23/2018   Syncope 12/14/2017   Pain and swelling of right lower leg 10/28/2016   Right leg numbness 10/28/2016   Chronic systolic CHF (congestive heart failure), NYHA class 2 (HCC) 02/26/2016   OSA (obstructive sleep apnea) 12/31/2015   History of breast cancer in female 10/30/2015   Ovarian cyst 10/10/2015   Bilateral ovarian cysts 10/09/2015   S/P oophorectomy 10/09/2015   Morbid obesity with BMI of 40.0-44.9, adult (HCC) 08/08/2015   Benign essential HTN 09/18/2014   RBBB 09/18/2014   Incomplete RBBB    DCM (dilated cardiomyopathy) (HCC) 04/24/2014   Meniere disease 06/02/2012   Past Medical History:  Diagnosis Date   Agatston coronary artery calcium score less than 100    Coronary calcium score was mildly elevated at 19 which is 55th percentile for age, race and sex matched controls.   Allergic rhinitis    Asthma    Breast cancer (HCC) 1996   Left breast-stage II infiltrating ductal carcinoma, 3/8 axillary lymph nodes were positive for metastatic carcinoma, getting yearly mammogram and every other year MRI followed by oncologist Dr Cloretta   Complication of anesthesia    WAKE UP WITH HEADACHE, difficulty waking up   DCM (dilated cardiomyopathy) (HCC)    EF 55-60% by echo 04/2019   Dysuria 2011   Family history of adverse reaction to anesthesia    family wakes up with personality changes, but are very short term   Fatty liver CT 1/12   elevated LFT   Genetic testing 03/13/2018   Common cancers panel (47 genes) @ [facility] - No pathogenic mutations detected   H/O fibromyalgia    H/O osteopenia 2009   H/O peripheral neuropathy    H/O varicose veins    Hyperlipidemia    Hypertension    Incomplete RBBB    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  Intolerant to PAP therapy   Pelvic pain in female    PMB  (postmenopausal bleeding) 2011   Pregnancy induced hypertension    Right-sided lacunar infarction [facility])    MRI 1.2012   Tubular adenoma of colon     Family History  Problem Relation Age of Onset   Heart disease Mother        MI   Stroke Mother    Diabetes Mother    Hypertension Mother    Blindness Mother    Stroke Father    Prostate cancer Father 56       deceased  42   Colon cancer Sister 9       currently 43   Breast cancer Paternal Aunt 80       deceased 22s   Breast cancer Cousin        daughter of pat aunt w/ breast ca   Breast cancer Cousin        daughter of pat aunt w/ breast ca   Anxiety disorder Daughter    Depression Daughter     Past Surgical History:  Procedure Laterality Date   Gastroc muscle repair  2007   LAPAROSCOPIC CHOLECYSTECTOMY     MASTECTOMY  06/1995   Left - for infiltrating ductal carcinoma   MASTECTOMY W/ SENTINEL NODE BIOPSY Right 03/31/2018   MASTECTOMY W/ SENTINEL NODE BIOPSY Right 03/31/2018   Procedure: RIGHT TOTAL MASTECTOMY WITH AXILLARY SENTINEL LYMPH NODE BIOPSY ERAS PATHWAY;  Surgeon: Mikell Katz, MD;  Location: [facility] OR;  Service: General;  Laterality: Right;   NEPHRECTOMY Right late 1990s   due to kidney shrinkage   PACEMAKER IMPLANT N/A 04/16/2019   Procedure: PACEMAKER IMPLANT;  Surgeon: Inocencio Soyla Lunger, MD;  Location: [facility];  Service: Cardiovascular;  Laterality: N/A;   ROBOTIC ASSISTED TOTAL HYSTERECTOMY WITH BILATERAL SALPINGO OOPHERECTOMY Bilateral 10/09/2015   Procedure: ROBOTIC ASSISTED BILATERAL SALPINGO OOPHORECTOMY ;  Surgeon: Maurilio Ship, MD;  Location: [facility] ORS;  Service: Gynecology;  Laterality: Bilateral;   Tram surgery  1996   TUBAL LIGATION     Social History   Occupational History   Not on file  Tobacco Use   Smoking status: Never   Smokeless tobacco: Never  Vaping Use   Vaping status: Never Used  Substance and Sexual Activity   Alcohol use: No   Drug use: No   Sexual activity: Not on file          "
